# Patient Record
Sex: Female | Born: 1990 | Race: Black or African American | Hispanic: No | Marital: Single | Smoking: Never smoker
Health system: Southern US, Community
[De-identification: ages and names within clinical notes are randomized; demographics above are authoritative.]

## PROBLEM LIST (undated history)

## (undated) DIAGNOSIS — M797 Fibromyalgia: Secondary | ICD-10-CM

## (undated) DIAGNOSIS — G4700 Insomnia, unspecified: Secondary | ICD-10-CM

## (undated) DIAGNOSIS — R51 Headache: Secondary | ICD-10-CM

## (undated) DIAGNOSIS — G629 Polyneuropathy, unspecified: Secondary | ICD-10-CM

## (undated) DIAGNOSIS — R519 Headache, unspecified: Secondary | ICD-10-CM

## (undated) DIAGNOSIS — E109 Type 1 diabetes mellitus without complications: Secondary | ICD-10-CM

## (undated) DIAGNOSIS — R87612 Low grade squamous intraepithelial lesion on cytologic smear of cervix (LGSIL): Secondary | ICD-10-CM

## (undated) DIAGNOSIS — J302 Other seasonal allergic rhinitis: Secondary | ICD-10-CM

## (undated) DIAGNOSIS — H3340 Traction detachment of retina, unspecified eye: Secondary | ICD-10-CM

## (undated) DIAGNOSIS — K219 Gastro-esophageal reflux disease without esophagitis: Secondary | ICD-10-CM

## (undated) HISTORY — DX: Insomnia, unspecified: G47.00

## (undated) HISTORY — DX: Polyneuropathy, unspecified: G62.9

## (undated) HISTORY — DX: Other seasonal allergic rhinitis: J30.2

## (undated) HISTORY — PX: EYE SURGERY: SHX253

## (undated) HISTORY — PX: CATARACT EXTRACTION W/ INTRAOCULAR LENS  IMPLANT, BILATERAL: SHX1307

---
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
## 2010-02-24 ENCOUNTER — Other Ambulatory Visit
Admission: RE | Admit: 2010-02-24 | Discharge: 2010-02-24 | Payer: Self-pay | Source: Home / Self Care | Admitting: Family Medicine

## 2010-12-02 LAB — CBC
HCT: 35.2 — ABNORMAL LOW
Hemoglobin: 12
MCHC: 34.1
MCV: 90.1
Platelets: 302
RBC: 3.91
RDW: 15.4
WBC: 5.7

## 2010-12-02 LAB — WET PREP, GENITAL
Clue Cells Wet Prep HPF POC: NONE SEEN
Trich, Wet Prep: NONE SEEN
Yeast Wet Prep HPF POC: NONE SEEN

## 2010-12-02 LAB — GC/CHLAMYDIA PROBE AMP, GENITAL
Chlamydia, DNA Probe: NEGATIVE
GC Probe Amp, Genital: NEGATIVE

## 2010-12-02 LAB — RH IMMUNE GLOBULIN WORKUP (NOT WOMEN'S HOSP)
ABO/RH(D): A NEG
Antibody Screen: NEGATIVE

## 2010-12-02 LAB — ABO/RH: ABO/RH(D): A NEG

## 2010-12-02 LAB — HCG, QUANTITATIVE, PREGNANCY
hCG, Beta Chain, Quant, S: 397 — ABNORMAL HIGH
hCG, Beta Chain, Quant, S: 846 — ABNORMAL HIGH

## 2010-12-03 LAB — HCG, QUANTITATIVE, PREGNANCY
hCG, Beta Chain, Quant, S: 32 — ABNORMAL HIGH
hCG, Beta Chain, Quant, S: 8 — ABNORMAL HIGH
hCG, Beta Chain, Quant, S: 3

## 2011-11-22 ENCOUNTER — Ambulatory Visit (INDEPENDENT_AMBULATORY_CARE_PROVIDER_SITE_OTHER): Payer: PRIVATE HEALTH INSURANCE | Admitting: Women's Health

## 2011-11-22 ENCOUNTER — Encounter: Payer: Self-pay | Admitting: Women's Health

## 2011-11-22 VITALS — BP 132/66 | Ht 63.0 in | Wt 130.0 lb

## 2011-11-22 DIAGNOSIS — E109 Type 1 diabetes mellitus without complications: Secondary | ICD-10-CM | POA: Insufficient documentation

## 2011-11-22 DIAGNOSIS — B373 Candidiasis of vulva and vagina: Secondary | ICD-10-CM | POA: Insufficient documentation

## 2011-11-22 DIAGNOSIS — IMO0001 Reserved for inherently not codable concepts without codable children: Secondary | ICD-10-CM

## 2011-11-22 DIAGNOSIS — Z309 Encounter for contraceptive management, unspecified: Secondary | ICD-10-CM

## 2011-11-22 LAB — WET PREP FOR TRICH, YEAST, CLUE
Clue Cells Wet Prep HPF POC: NONE SEEN
Trich, Wet Prep: NONE SEEN

## 2011-11-22 MED ORDER — FLUCONAZOLE 150 MG PO TABS: 150.0000 mg | ORAL_TABLET | Freq: Once | ORAL | Status: DC

## 2011-11-22 MED ORDER — TERCONAZOLE 0.8 % VA CREA: 1.0000 | TOPICAL_CREAM | Freq: Every day | VAGINAL | Status: DC

## 2011-11-22 MED ORDER — ORTHO-NOVUM 1/35 (28) 1-35 MG-MCG PO TABS: 1.0000 | ORAL_TABLET | Freq: Every day | ORAL | Status: DC

## 2011-11-22 NOTE — Patient Instructions (Addendum)
Monilial Vaginitis Vaginitis in a soreness, swelling and redness (inflammation) of the vagina and vulva. Monilial vaginitis is not a sexually transmitted infection. CAUSES  Yeast vaginitis is caused by yeast (candida) that is normally found in your vagina. With a yeast infection, the candida has overgrown in number to a point that upsets the chemical balance. SYMPTOMS   White, thick vaginal discharge.   Swelling, itching, redness and irritation of the vagina and possibly the lips of the vagina (vulva).   Burning or painful urination.   Painful intercourse.  DIAGNOSIS  Things that may contribute to monilial vaginitis are:  Postmenopausal and virginal states.   Pregnancy.   Infections.   Being tired, sick or stressed, especially if you had monilial vaginitis in the past.   Diabetes. Good control will help lower the chance.   Birth control pills.   Tight fitting garments.   Using bubble bath, feminine sprays, douches or deodorant tampons.   Taking certain medications that kill germs (antibiotics).   Sporadic recurrence can occur if you become ill.  TREATMENT  Your caregiver will give you medication.  There are several kinds of anti monilial vaginal creams and suppositories specific for monilial vaginitis. For recurrent yeast infections, use a suppository or cream in the vagina 2 times a week, or as directed.   Anti-monilial or steroid cream for the itching or irritation of the vulva may also be used. Get your caregiver's permission.   Painting the vagina with methylene blue solution may help if the monilial cream does not work.   Eating yogurt may help prevent monilial vaginitis.  HOME CARE INSTRUCTIONS   Finish all medication as prescribed.   Do not have sex until treatment is completed or after your caregiver tells you it is okay.   Take warm sitz baths.   Do not douche.   Do not use tampons, especially scented ones.   Wear cotton underwear.   Avoid tight  pants and panty hose.   Tell your sexual partner that you have a yeast infection. They should go to their caregiver if they have symptoms such as mild rash or itching.   Your sexual partner should be treated as well if your infection is difficult to eliminate.   Practice safer sex. Use condoms.   Some vaginal medications cause latex condoms to fail. Vaginal medications that harm condoms are:   Cleocin cream.   Butoconazole (Femstat).   Terconazole (Terazol) vaginal suppository.   Miconazole (Monistat) (may be purchased over the counter).  SEEK MEDICAL CARE IF:   You have a temperature by mouth above 102 F (38.9 C).   The infection is getting worse after 2 days of treatment.   The infection is not getting better after 3 days of treatment.   You develop blisters in or around your vagina.   You develop vaginal bleeding, and it is not your menstrual period.   You have pain when you urinate.   You develop intestinal problems.   You have pain with sexual intercourse.  Document Released: 12/02/2004 Document Revised: 02/11/2011 Document Reviewed: 08/16/2008 [facility] Patient Information 2012 [place], [place].Diabetes and Exercise Regular exercise is important and can help:   Control blood glucose (sugar).   Decrease blood pressure.    Control blood lipids (cholesterol, triglycerides).   Improve overall health.  BENEFITS FROM EXERCISE  Improved fitness.   Improved flexibility.   Improved endurance.   Increased bone density.   Weight control.   Increased muscle strength.   Decreased body fat.  Improvement of the body's use of insulin, a hormone.   Increased insulin sensitivity.   Reduction of insulin needs.   Reduced stress and tension.   Helps you feel better.  People with diabetes who add exercise to their lifestyle gain additional benefits, including:  Weight loss.   Reduced appetite.   Improvement of the body's use of blood glucose.    Decreased risk factors for heart disease:   Lowering of cholesterol and triglycerides.   Raising the level of good cholesterol (high-density lipoproteins, HDL).   Lowering blood sugar.   Decreased blood pressure.  TYPE 1 DIABETES AND EXERCISE  Exercise will usually lower your blood glucose.   If blood glucose is greater than 240 mg/dl, check urine ketones. If ketones are present, do not exercise.   Location of the insulin injection sites may need to be adjusted with exercise. Avoid injecting insulin into areas of the body that will be exercised. For example, avoid injecting insulin into:   The arms when playing tennis.   The legs when jogging. For more information, discuss this with your caregiver.   Keep a record of:   Food intake.   Type and amount of exercise.   Expected peak times of insulin action.   Blood glucose levels.  Do this before, during, and after exercise. Review your records with your caregiver. This will help you to develop guidelines for adjusting food intake and insulin amounts.  TYPE 2 DIABETES AND EXERCISE  Regular physical activity can help control blood glucose.   Exercise is important because it may:   Increase the body's sensitivity to insulin.   Improve blood glucose control.   Exercise reduces the risk of heart disease. It decreases serum cholesterol and triglycerides. It also lowers blood pressure.   Those who take insulin or oral hypoglycemic agents should watch for signs of hypoglycemia. These signs include dizziness, shaking, sweating, chills, and confusion.   Body water is lost during exercise. It must be replaced. This will help to avoid loss of body fluids (dehydration) or heat stroke.  Be sure to talk to your caregiver before starting an exercise program to make sure it is safe for you. Remember, any activity is better than none.  Document Released: 05/15/2003 Document Revised: 02/11/2011 Document Reviewed: 08/29/2008 [facility]  Patient Information 2012 [place], [place].

## 2011-11-22 NOTE — Progress Notes (Addendum)
Theresa [name] 1991-03-08 [id number]    History:    Presents for problem visit as a new patient. Complaint of vaginal discharge with itching. Regular monthly cycle on Ortho-Novum. Not sexually active for greater than 2 years. Normal Pap smear within past year. Completed gardasil series in high school. Diagnosed with type 1 diabetes age 21. Currently being treated by primary care. Last hemoglobin A1c 12.   Past medical history, past surgical history, family history and social history were all reviewed and documented in the EPIC chart. Senior at [employer]. family studies.   ROS:  A  ROS was performed and pertinent positives and negatives are included in the history.  Exam:  Filed Vitals:   11/22/11 1501  BP: 132/66    General appearance:  Normal Head/Neck:  Normal, without cervical or supraclavicular adenopathy. Thyroid:  Symmetrical, normal in size, without palpable masses or nodularity. Respiratory  Effort:  Normal  Auscultation:  Clear without wheezing or rhonchi Cardiovascular  Auscultation:  Regular rate, without rubs, murmurs or gallops  Edema/varicosities:  Not grossly evident Abdominal  Soft,nontender, without masses, guarding or rebound.  Liver/spleen:  No organomegaly noted  Hernia:  None appreciated  Skin  Inspection:  Grossly normal  Palpation:  Grossly normal Neurologic/psychiatric  Orientation:  Normal with appropriate conversation.  Mood/affect:  Normal  Genitourinary    Breasts: Examined lying and sitting.     Right: Without masses, retractions, discharge or axillary adenopathy.     Left: Without masses, retractions, discharge or axillary adenopathy.   Inguinal/mons:  Normal without inguinal adenopathy  External genitalia: Erythematous  BUS/Urethra/Skene's glands:  Normal  Bladder:  Normal  Vagina:  Erythematous with copious curdy discharge   Cervix:  Normal  Uterus:   normal in size, shape and contour.  Midline and mobile  Adnexa/parametria:      Rt: Without masses or tenderness.   Lt: Without masses or tenderness.  Anus and perineum: Normal    Assessment/Plan:  21 y.o. SBF G0 for problem visit of vaginal itching with discharge.  Yeast Poor control type 1 diabetes Contraception management  Plan: Diflucan 150 by mouth x1 dose, Terazol 3 one applicator at bedtime x3 and apply externally as needed. Reviewed correlation between poor diabetes control and yeast. Reviewed importance of better control to prevent long-term problems and yeast, encouraged endocrinologist, currently her insurance does not cover, College student states cannot afford. Encouraged to seek nutritional counseling at [facility]. SBE's, exercise, calcium rich diet, MVI daily encouraged. Ortho-Novum branded name only per request, prescription, proper use, reviewed importance of condoms if become sexually active.    Harrington Challenger [facility], 4:50 PM 11/22/2011 Other contraception management was discussed. Mirena IUD, ParaGard were nexplanon. Risks for blood clots strokes with birth control pills reviewed especially with poor diabetes control reviewed, discussed importance of no pregnancy prior to good glucose control to help with having a healthy pregnancy.

## 2011-11-24 ENCOUNTER — Telehealth: Payer: Self-pay | Admitting: *Deleted

## 2011-11-24 ENCOUNTER — Other Ambulatory Visit: Payer: Self-pay | Admitting: Women's Health

## 2011-11-24 DIAGNOSIS — N920 Excessive and frequent menstruation with regular cycle: Secondary | ICD-10-CM

## 2011-11-24 MED ORDER — NORETHIN ACE-ETH ESTRAD-FE 1-20 MG-MCG PO TABS: 1.0000 | ORAL_TABLET | Freq: Every day | ORAL | Status: DC

## 2011-11-24 NOTE — Telephone Encounter (Signed)
Telephone call, states would like to try a different type of a pill. Used combination birth control pills for menorrhagia with good relief and would like to continue. Will try Loestrin 1/20 will call if problems with the change. Again reviewed IUDs, Mirena IUD may be a good choice since type I diabetic and has a problem with menorrhagia, again reviewed problems with blood clots and strokes with birth control pills especially since type I diabetic with poor control. Declines at this time.

## 2011-11-24 NOTE — Telephone Encounter (Signed)
Left on pt voicemail with the below.

## 2011-11-24 NOTE — Telephone Encounter (Signed)
Pt aunt Burna Mortimer calling stating that pt was going to have birth switched at [facility] 11/22/11. Pt went to pharmacy and same rx was giving ortho-novum. Does pt suppose to have different rx?  Please advise

## 2011-11-24 NOTE — Telephone Encounter (Signed)
Patient at office visit requested branded name Ortho-Novum, had been on generic but preferred branded.

## 2011-11-24 NOTE — Telephone Encounter (Signed)
Pt called back and said she thought a new rx was going to be given. Call back # [phone].

## 2012-01-04 ENCOUNTER — Telehealth: Payer: Self-pay | Admitting: *Deleted

## 2012-01-04 NOTE — Telephone Encounter (Signed)
Pt called c/o some bleeding while taking her Ortho-Novum, pt noticed spotting beginning of October when starting pills. Still in 1st pack and having bleeding not heavy, not cycle flow, pt said bleed has slowed down. Pt informed this can happen when starting on new pill and to give it 3 packs. Pt will follow up as needed.

## 2012-06-14 ENCOUNTER — Ambulatory Visit: Payer: Self-pay | Admitting: Women's Health

## 2012-06-16 ENCOUNTER — Ambulatory Visit (INDEPENDENT_AMBULATORY_CARE_PROVIDER_SITE_OTHER): Payer: PRIVATE HEALTH INSURANCE | Admitting: Women's Health

## 2012-06-16 ENCOUNTER — Encounter: Payer: Self-pay | Admitting: Women's Health

## 2012-06-16 DIAGNOSIS — Z309 Encounter for contraceptive management, unspecified: Secondary | ICD-10-CM

## 2012-06-16 DIAGNOSIS — B373 Candidiasis of vulva and vagina: Secondary | ICD-10-CM

## 2012-06-16 DIAGNOSIS — N898 Other specified noninflammatory disorders of vagina: Secondary | ICD-10-CM

## 2012-06-16 DIAGNOSIS — IMO0001 Reserved for inherently not codable concepts without codable children: Secondary | ICD-10-CM

## 2012-06-16 LAB — WET PREP FOR TRICH, YEAST, CLUE
Clue Cells Wet Prep HPF POC: NONE SEEN
Trich, Wet Prep: NONE SEEN

## 2012-06-16 MED ORDER — TERCONAZOLE 0.8 % VA CREA: 1.0000 | TOPICAL_CREAM | Freq: Every day | VAGINAL | Status: DC

## 2012-06-16 MED ORDER — NORGESTREL-ETHINYL ESTRADIOL 0.3-30 MG-MCG PO TABS: 1.0000 | ORAL_TABLET | Freq: Every day | ORAL | Status: DC

## 2012-06-16 NOTE — Progress Notes (Signed)
Patient ID: Theresa [name], female   DOB: 22-Jun-1990, 22 y.o.   MRN: [id number] Presents with several issues. Type I diabetic, poor control with recurrent yeast. Denies urinary symptoms, abdominal pain or fever.Continues to have irregular cycles, cycle on third week of pack were amenorrhea on Loestrin 1/20. Denies missing pills. New partner. Gardasil series completed.  Exam: Appears well, external genitalia extremely erythematous, speculum exam moderate amount of white curdy discharge with menses present. Wet prep positive for yeast. GC/Chlamydia culture taken, bimanual no CMT or adnexal fullness or tenderness.  Yeast vaginitis Poor control of type 1 diabetes  Plan: Terazol 3 one applicator at bedtime x3, prescription, proper use given and reviewed. Reviewed importance of better control of diabetes in relationship to yeast infections. Reviewed importance of an endocrinologist for management of type 1 diabetes. Reports primary care is in the process of making referral. Also instructed to schedule annual exam here. Prescription, proper use of loorval given will start after finishing current pack. Instructed to call if no relief of irregular cycle or amenorrhea.

## 2012-06-19 LAB — GC/CHLAMYDIA PROBE AMP
CT Probe RNA: NEGATIVE
GC Probe RNA: NEGATIVE

## 2012-11-24 ENCOUNTER — Encounter: Payer: PRIVATE HEALTH INSURANCE | Admitting: Women's Health

## 2013-01-05 ENCOUNTER — Other Ambulatory Visit (HOSPITAL_COMMUNITY)
Admission: RE | Admit: 2013-01-05 | Discharge: 2013-01-05 | Disposition: A | Discharge status: Home / Self Care | Payer: No Typology Code available for payment source | Source: Ambulatory Visit | Attending: Gynecology | Admitting: Gynecology

## 2013-01-05 ENCOUNTER — Encounter: Payer: Self-pay | Admitting: Women's Health

## 2013-01-05 ENCOUNTER — Ambulatory Visit (INDEPENDENT_AMBULATORY_CARE_PROVIDER_SITE_OTHER): Payer: PRIVATE HEALTH INSURANCE | Admitting: Women's Health

## 2013-01-05 VITALS — BP 108/68 | Ht 63.25 in | Wt 115.0 lb

## 2013-01-05 DIAGNOSIS — Z01419 Encounter for gynecological examination (general) (routine) without abnormal findings: Secondary | ICD-10-CM | POA: Insufficient documentation

## 2013-01-05 DIAGNOSIS — N9489 Other specified conditions associated with female genital organs and menstrual cycle: Secondary | ICD-10-CM

## 2013-01-05 DIAGNOSIS — N949 Unspecified condition associated with female genital organs and menstrual cycle: Secondary | ICD-10-CM

## 2013-01-05 LAB — WET PREP FOR TRICH, YEAST, CLUE
Clue Cells Wet Prep HPF POC: NONE SEEN
Trich, Wet Prep: NONE SEEN

## 2013-01-05 MED ORDER — FLUCONAZOLE 100 MG PO TABS
ORAL_TABLET | ORAL | Status: DC
Start: 2013-01-05 — End: 2013-07-09

## 2013-01-05 NOTE — Progress Notes (Signed)
Theresa [name] Jul 28, 1990 [id number]    History:    The patient presents for annual exam.  Complaint of intense vaginal itching with irritation. Has had problems with recurrent yeast due to type 1 diabetes/poor control. Diagnosed at age 22.  Has not been sexually active in greater than 6 months/negative STD screen. Gardasil series completed. Has a scheduled appointment with [name] endocrinologist. Has lost 15 pounds in the past year, relates to increase exercise.   Past medical history, past surgical history, family history and social history were all reviewed and documented in the EPIC chart. [employer] 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   ROS:  A  ROS was performed and pertinent positives and negatives are included in the history.  Exam:  Filed Vitals:   01/05/13 0915  BP: 108/68    General appearance:  Normal Head/Neck:  Normal, without cervical or supraclavicular adenopathy. Thyroid:  Symmetrical, normal in size, without palpable masses or nodularity. Respiratory  Effort:  Normal  Auscultation:  Clear without wheezing or rhonchi Cardiovascular  Auscultation:  Regular rate, without rubs, murmurs or gallops  Edema/varicosities:  Not grossly evident Abdominal  Soft,nontender, without masses, guarding or rebound.  Liver/spleen:  No organomegaly noted  Hernia:  None appreciated  Skin  Inspection:  Grossly normal  Palpation:  Grossly normal Neurologic/psychiatric  Orientation:  Normal with appropriate conversation.  Mood/affect:  Normal  Genitourinary    Breasts: Examined lying and sitting.     Right: Without masses, retractions, discharge or axillary adenopathy.     Left: Without masses, retractions, discharge or axillary adenopathy.   Inguinal/mons:  Normal without inguinal adenopathy  External genitalia:  Extremely erythematous  BUS/Urethra/Skene's glands:  Normal  Bladder:  Normal  Vagina: Erythematous with copious white curdy discharge, wet prep  positive for TNTC E. Yeast  Cervix:  Normal  Uterus:  normal in size, shape and contour.  Midline and mobile  Adnexa/parametria:     Rt: Without masses or tenderness.   Lt: Without masses or tenderness.  Anus and perineum: Normal   Assessment/Plan:  22 y.o. SBF G1P0 for annual exam with complaint of vaginal itching with irritation.  Severe yeast Type 1 diabetes poor control-has followup scheduled Contraception management  Plan: Diflucan 100  2  tablets today repeat in 3 days if necessary. Prescription, proper use given and reviewed importance of diabetes control in relationship to yeast prevention. SBE's, regular exercise, calcium rich diet, MVI daily encouraged. Keep scheduled appointment with Theresa [name] endocrinologist. Contraception options reviewed, Mirena IUD information given and reviewed risk for infection, perforation, hemorrhage. Reviewed importance of condoms when become sexually active. Has had nausea with low overall. Sample pack of Lo Loestrin FE given will schedule Mirena IUD with Dr. Audie Box with next cycle. Pap.    Harrington Challenger WHNP, 11:05 AM 01/05/2013

## 2013-01-05 NOTE — Patient Instructions (Signed)

## 2013-02-26 ENCOUNTER — Telehealth: Payer: Self-pay

## 2013-02-26 NOTE — Telephone Encounter (Signed)
At office visit you gave her samples of Lo Loestrin Fe.  Working well and she would like to get Rx to pharmacy.  Had discussed Mirena but just has not had the time to do that.  Wants Rx. Ok?

## 2013-02-26 NOTE — Telephone Encounter (Signed)
She is a type 1 diabetic, loestrin ok if the lo loestrin is to expensive.

## 2013-02-26 NOTE — Telephone Encounter (Signed)
Ok for the lo loestrin, please e scribe for her.

## 2013-02-27 ENCOUNTER — Other Ambulatory Visit: Payer: Self-pay | Admitting: Women's Health

## 2013-02-27 MED ORDER — NORETHIN-ETH ESTRAD-FE BIPHAS 1 MG-10 MCG / 10 MCG PO TABS: 1.0000 | ORAL_TABLET | Freq: Every day | ORAL | Status: DC

## 2013-02-27 NOTE — Telephone Encounter (Signed)
Mom informed. Rx in.

## 2013-05-07 ENCOUNTER — Ambulatory Visit (INDEPENDENT_AMBULATORY_CARE_PROVIDER_SITE_OTHER): Payer: PRIVATE HEALTH INSURANCE | Admitting: Internal Medicine

## 2013-05-07 VITALS — BP 110/70 | HR 100 | Temp 98.3°F | Resp 16 | Ht 63.0 in | Wt 108.0 lb

## 2013-05-07 DIAGNOSIS — R599 Enlarged lymph nodes, unspecified: Secondary | ICD-10-CM

## 2013-05-07 DIAGNOSIS — R591 Generalized enlarged lymph nodes: Secondary | ICD-10-CM

## 2013-05-07 LAB — POCT CBC
Granulocyte percent: 50.9 %G (ref 37–80)
HCT, POC: 44.9 % (ref 37.7–47.9)
Hemoglobin: 14.1 g/dL (ref 12.2–16.2)
Lymph, poc: 2.3 (ref 0.6–3.4)
MCH, POC: 31.5 pg — AB (ref 27–31.2)
MCHC: 31.4 g/dL — AB (ref 31.8–35.4)
MCV: 100.2 fL — AB (ref 80–97)
MID (cbc): 0.3 (ref 0–0.9)
MPV: 8.8 fL (ref 0–99.8)
POC Granulocyte: 2.7 (ref 2–6.9)
POC LYMPH PERCENT: 43.1 %L (ref 10–50)
POC MID %: 6 %M (ref 0–12)
Platelet Count, POC: 281 10*3/uL (ref 142–424)
RBC: 4.48 M/uL (ref 4.04–5.48)
RDW, POC: 13.1 %
WBC: 5.3 10*3/uL (ref 4.6–10.2)

## 2013-05-07 MED ORDER — AMOXICILLIN-POT CLAVULANATE 875-125 MG PO TABS: 1.0000 | ORAL_TABLET | Freq: Two times a day (BID) | ORAL | Status: DC

## 2013-05-07 NOTE — Progress Notes (Addendum)
Subjective:    Patient ID: Theresa [name], female    DOB: 11/01/1990, 23 y.o.   MRN: [id number]  HPI  Chief Complaint  Patient presents with  . Mass    under right ear x 6 days  tender/was larger yesterday;sl ear pain/no teeth pain No fever,ST,Fatigue,cough,night sweats  Patient Active Problem List   Diagnosis Date Noted  . Type 1 diabetes mellitus 11/22/2011  . Yeast vaginitis 11/22/2011   Current Outpatient Prescriptions on File Prior to Visit  Medication Sig Dispense Refill  . Ascorbic Acid (VITAMIN C PO) Take by mouth.      Marland Kitchen. BIOTIN PO Take by mouth.      . insulin aspart (NOVOLOG) 100 UNIT/ML injection Inject into the skin 3 (three) times daily before meals.      . insulin detemir (LEVEMIR) 100 UNIT/ML injection Inject into the skin at bedtime.      . metFORMIN (GLUCOPHAGE) 850 MG tablet Take 850 mg by mouth 3 (three) times daily with meals.      . Norethindrone-Ethinyl Estradiol-Fe Biphas (LO LOESTRIN FE) 1 MG-10 MCG / 10 MCG tablet Take 1 tablet by mouth daily.  1 Package  10  . fluconazole (DIFLUCAN) 100 MG tablet Take 2 today and 1 tablet as needed  30 tablet  1   No current facility-administered medications on file prior to visit.  onset DM age 23 To begin seeing Dr Sharl MaKerr in March     Review of Systems noncontr    Objective:   Physical Exam BP 110/70  Pulse 100  Temp(Src) 98.3 F (36.8 C) (Oral)  Resp 16  Ht 5\' 3"  (1.6 m)  Wt 108 lb (48.988 kg)  BMI 19.14 kg/m2  SpO2 99%  LMP 04/24/2013 Pupils equal round reactive to light and accommodation TMs clear Nares clear Throat clear teeth exhibit several significant caries with gingivitis but no abscesses Parotid glands non-palpable No anterior cervical lymphadenopathy  In the lower postauricular space on the right is a 1.5 cm movable tender mass that is smooth and slightly fluctuant No overlying erythema/ no supraclavicular adenopathy Lungs clear  Results for orders placed in visit on 05/07/13  POCT  CBC      Result Value Ref Range   WBC 5.3  4.6 - 10.2 K/uL   Lymph, poc 2.3  0.6 - 3.4   POC LYMPH PERCENT 43.1  10 - 50 %L   MID (cbc) 0.3  0 - 0.9   POC MID % 6.0  0 - 12 %M   POC Granulocyte 2.7  2 - 6.9   Granulocyte percent 50.9  37 - 80 %G   RBC 4.48  4.04 - 5.48 M/uL   Hemoglobin 14.1  12.2 - 16.2 g/dL   HCT, POC 78.244.9  95.637.7 - 47.9 %   MCV 100.2 (*) 80 - 97 fL   MCH, POC 31.5 (*) 27 - 31.2 pg   MCHC 31.4 (*) 31.8 - 35.4 g/dL   RDW, POC 21.313.1     Platelet Count, POC 281  142 - 424 K/uL   MPV 8.8  0 - 99.8 fL        Assessment & Plan:  Reactive adenopathy versus cyst/mass  Meds ordered this encounter  Medications  . amoxicillin-clavulanate (AUGMENTIN) 875-125 MG per tablet    Sig: Take 1 tablet by mouth 2 (two) times daily.    Dispense:  20 tablet    Refill:  0   Recheck in 10 days/if no response will need  excisional biopsy

## 2013-05-07 NOTE — Patient Instructions (Signed)
Recheck at 945 am on wed 05/16/13 at our [facility] [address]

## 2013-05-10 ENCOUNTER — Telehealth: Payer: Self-pay

## 2013-05-10 NOTE — Telephone Encounter (Signed)
Advised pt to RTC- she is having other symptoms. She is having mouth pain, dental pain additionally.

## 2013-05-10 NOTE — Telephone Encounter (Signed)
Dr. Merla Richesoolittle:   Patient saw you Monday and was prescribed medication and it is not helping.  Please advise.  [phone] is her best number.  She may be in class so it's okay to leave a voicemail.

## 2013-05-10 NOTE — Progress Notes (Signed)
I have made this appointment per Dr Doolittle's request.

## 2013-05-16 ENCOUNTER — Ambulatory Visit (INDEPENDENT_AMBULATORY_CARE_PROVIDER_SITE_OTHER): Payer: PRIVATE HEALTH INSURANCE | Admitting: Internal Medicine

## 2013-05-16 ENCOUNTER — Encounter: Payer: Self-pay | Admitting: Internal Medicine

## 2013-05-16 VITALS — BP 119/85 | HR 102 | Temp 97.4°F | Resp 16 | Ht 63.0 in | Wt 116.4 lb

## 2013-05-16 DIAGNOSIS — R220 Localized swelling, mass and lump, head: Secondary | ICD-10-CM

## 2013-05-16 DIAGNOSIS — R221 Localized swelling, mass and lump, neck: Secondary | ICD-10-CM

## 2013-05-16 NOTE — Progress Notes (Signed)
Dr Ezzard StandingNewman will see patient on Monday March 16th at 1pm, called patient to advise. Lupita LeashDonna will fax records to his office fax [phone]

## 2013-05-16 NOTE — Progress Notes (Signed)
  This chart was scribed for Tonye Pearsonobert P Jasdeep Dejarnett, MD by Joaquin MusicKristina Sanchez-Matthews, ED Scribe. This patient was seen in room Room/bed 24 and the patient's care was started at 10:06 AM. Subjective:    Patient ID: Roberts Gaudyourtney [name] Liguori, female    DOB: 06/13/1990, 23 y.o.   MRN: [id number] Chief Complaint  Patient presents with  . Follow-up    follow up on cyst on the back of right ear   HPI Roberts Theresa [name] Perra is a 23 y.o. female who [facility].Pt states she is still having severe throbbing pain to [name] ear. She describes the pain as "sharp and throbbing that shoots at times". Pt states the pain begins at the same time every day despite her activity. She states she did not complete her medication (Augmentin) as prescribed due to having diarrhea and emesis as a result of talking the medication. Pt states she went to the dentist yesterday afternoon and states her dentist informed her "everything was WNL". Pt denies having any pain with teeth to [name] side or pain when bitting down. She states she stopped taking her antibiotics "about 6 days ago" and states her Pt states she has taken OTC Ibuprofen and Advil without relief. Pt denies fever, fatigue, night sweats, and weight change.  Patient Active Problem List   Diagnosis Date Noted  . Type 1 diabetes mellitus 11/22/2011   Current outpatient prescriptions: insulin aspart (NOVOLOG) 100 UNIT/ML injection, Inject into the skin 3 (three) times daily before meals., insulin detemir (LEVEMIR) 100 UNIT/ML injection, Inject into the skin at bedtime., Disp: , Rfl: ;   metFORMIN (GLUCOPHAGE) 850 MG tablet, Take 850 mg by mouth 3 (three) times daily with meals., Disp: , Rfl: ;  Norethindrone-Ethinyl Estradiol-Fe Biphas (LO LOESTRIN FE) 1 MG-10 MCG / 10 MCG tablet,  Review of Systems  Constitutional: Negative for fever, fatigue and unexpected weight change.       No night sweats.   Objective:   Physical Exam  Constitutional: She appears well-developed and  well-nourished.  HENT:  Right Ear: External ear normal.  Left Ear: External ear normal.  Nose: Nose normal.  Mouth/Throat: Oropharynx is clear and moist.  In post auricular space there is a 2.5 cm mass that is firm and slightly moveable with darkening of the overlying skin, slightly tender. No other lymph nodes are noted.    Eyes: Conjunctivae and EOM are normal. Pupils are equal, round, and reactive to light.  Neck: Normal range of motion. No thyromegaly present.  Lymphadenopathy:    She has no cervical adenopathy.    Assessment & Plan:   I have completed the patient encounter in its entirety as documented by the scribe, with editing by me where necessary. Tremeka Helbling P. Merla Richesoolittle, M.D.  Diagnosis #1-persistent posterior cervical mass that needs identification? Excision--- to Dr. Ezzard StandingNewman for evaluation

## 2013-05-18 ENCOUNTER — Telehealth: Payer: Self-pay

## 2013-05-18 MED ORDER — MELOXICAM 15 MG PO TABS: 15.0000 mg | ORAL_TABLET | Freq: Every day | ORAL | Status: DC

## 2013-05-18 NOTE — Telephone Encounter (Signed)
PT STATES THAT DURING HER OV ON 05/16/13 HER AND DR.DOOLITTLE DISCUSSED A PAIN MED FOR THE MASS SHE HAS ON HER NECK, SHE STATES THAT IT WAS NEVER CALLED IN. BEST# [phone]

## 2013-05-18 NOTE — Telephone Encounter (Signed)
No pain medication prescribed during this ov. Please advise if one can be prescribed.

## 2013-05-18 NOTE — Telephone Encounter (Signed)
Pt advised of medication sent to pharmacy.

## 2013-05-18 NOTE — Telephone Encounter (Signed)
Discussed with Dr. Merla Richesoolittle.  Planned to send Meloxicam.  Meds ordered this encounter  Medications  . meloxicam (MOBIC) 15 MG tablet    Sig: Take 1 tablet (15 mg total) by mouth daily.    Dispense:  30 tablet    Refill:  0    Order Specific Question:  Supervising Provider    Answer:  DOOLITTLE, ROBERT P [[id number]]

## 2013-05-24 ENCOUNTER — Ambulatory Visit (INDEPENDENT_AMBULATORY_CARE_PROVIDER_SITE_OTHER): Payer: PRIVATE HEALTH INSURANCE | Admitting: Family Medicine

## 2013-05-24 VITALS — BP 110/80 | HR 102 | Temp 98.1°F | Resp 16 | Ht 63.0 in | Wt 116.0 lb

## 2013-05-24 DIAGNOSIS — R221 Localized swelling, mass and lump, neck: Secondary | ICD-10-CM

## 2013-05-24 DIAGNOSIS — E119 Type 2 diabetes mellitus without complications: Secondary | ICD-10-CM

## 2013-05-24 DIAGNOSIS — R599 Enlarged lymph nodes, unspecified: Secondary | ICD-10-CM

## 2013-05-24 DIAGNOSIS — R591 Generalized enlarged lymph nodes: Secondary | ICD-10-CM

## 2013-05-24 DIAGNOSIS — R220 Localized swelling, mass and lump, head: Secondary | ICD-10-CM

## 2013-05-24 DIAGNOSIS — M542 Cervicalgia: Secondary | ICD-10-CM

## 2013-05-24 LAB — POCT CBC
Granulocyte percent: 36.3 %G — AB (ref 37–80)
HCT, POC: 37.8 % (ref 37.7–47.9)
Hemoglobin: 12.1 g/dL — AB (ref 12.2–16.2)
Lymph, poc: 2.4 (ref 0.6–3.4)
MCH, POC: 31.3 pg — AB (ref 27–31.2)
MCHC: 32 g/dL (ref 31.8–35.4)
MCV: 97.7 fL — AB (ref 80–97)
MID (cbc): 0.4 (ref 0–0.9)
MPV: 9.5 fL (ref 0–99.8)
POC Granulocyte: 1.6 — AB (ref 2–6.9)
POC LYMPH PERCENT: 55.2 %L — AB (ref 10–50)
POC MID %: 8.5 %M (ref 0–12)
Platelet Count, POC: 280 10*3/uL (ref 142–424)
RBC: 3.87 M/uL — AB (ref 4.04–5.48)
RDW, POC: 12.9 %
WBC: 4.3 10*3/uL — AB (ref 4.6–10.2)

## 2013-05-24 LAB — POCT GLYCOSYLATED HEMOGLOBIN (HGB A1C): Hemoglobin A1C: >=14

## 2013-05-24 MED ORDER — HYDROCODONE-ACETAMINOPHEN 5-325 MG PO TABS: 1.0000 | ORAL_TABLET | Freq: Three times a day (TID) | ORAL | Status: DC | PRN

## 2013-05-24 NOTE — Progress Notes (Signed)
Chief Complaint:  Chief Complaint  Patient presents with  . Otalgia  . Lymphadenopathy    right side    HPI: Theresa [name] is a 23 y.o. female who is here for recheck of her right sided reactive LAD vs cyst. She was seen b Dr Merla Riches and then was given Amox for possible reactive LAD and had stomach upset. The LAD did not resolve and so was sent to ENT for further eval. She was seen by Dr Ezzard Standing and was given keflex Keflex 500 mg BID at ENT where she saw Dr Ezzard Standing and was told to f/u prn Today she has had more swelling and pain, she had severe pain when she put a warm compress on it.  She is also a diabetic, dx at age 69 with DM type 2???  she is supposed to be on metformin and also novolog with meals and also Levemir 35 untis at night. She is not on any of her insuilm due to cost. She is being followed by [facility] for this and was referred to see the endocrinologist for [facility] sometime in April. She states her sugars are not well controlled in the 300s  And sh does not remember her ast A1c   Past Medical History  Diagnosis Date  . Diabetes mellitus    No past surgical history on file. History   Social History  . Marital Status: Single    Spouse Name: N/A    Number of Children: N/A  . Years of Education: N/A   Social History Main Topics  . Smoking status: Never Smoker   . Smokeless tobacco: None  . Alcohol Use: No  . Drug Use: No  . Sexual Activity: No   Other Topics Concern  . None   Social History Narrative  . None   Family History  Problem Relation Age of Onset  . Diabetes Mother   . Migraines Mother   . Breast cancer Maternal Grandmother   . Thyroid disease Maternal Grandmother    No Known Allergies Prior to Admission medications   Medication Sig Start Date End Date Taking? Authorizing Provider  Ascorbic Acid (VITAMIN C PO) Take by mouth.   Yes Historical Provider, MD  BIOTIN PO Take by mouth.   Yes Historical Provider, MD  cetirizine (ZYRTEC) 10  MG tablet Take 10 mg by mouth daily.   Yes Historical Provider, MD  insulin aspart (NOVOLOG) 100 UNIT/ML injection Inject into the skin 3 (three) times daily before meals.   Yes Historical Provider, MD  insulin detemir (LEVEMIR) 100 UNIT/ML injection Inject into the skin at bedtime.   Yes Historical Provider, MD  metFORMIN (GLUCOPHAGE) 850 MG tablet Take 850 mg by mouth 3 (three) times daily with meals.   Yes Historical Provider, MD  Norethindrone-Ethinyl Estradiol-Fe Biphas (LO LOESTRIN FE) 1 MG-10 MCG / 10 MCG tablet Take 1 tablet by mouth daily. 02/27/13  Yes Harrington Challenger, NP  amoxicillin-clavulanate (AUGMENTIN) 875-125 MG per tablet Take 1 tablet by mouth 2 (two) times daily. 05/07/13   Tonye Pearson, MD  fluconazole (DIFLUCAN) 100 MG tablet Take 2 today and 1 tablet as needed 01/05/13   Harrington Challenger, NP  meloxicam (MOBIC) 15 MG tablet Take 1 tablet (15 mg total) by mouth daily. 05/18/13   Chelle S Jeffery, PA-C     ROS: The patient denies fevers, chills, night sweats, unintentional weight loss, chest pain, palpitations, wheezing, dyspnea on exertion, nausea, vomiting, abdominal pain, dysuria, hematuria,  melena, numbness, weakness, or tingling.   All other systems have been reviewed and were otherwise negative with the exception of those mentioned in the HPI and as above.    PHYSICAL EXAM: Filed Vitals:   05/24/13 1920  BP: 110/80  Pulse: 102  Temp: 98.1 F (36.7 C)  Resp: 16   Filed Vitals:   05/24/13 1920  Height: 5\' 3"  (1.6 m)  Weight: 116 lb (52.617 kg)   Body mass index is 20.55 kg/(m^2).  General: Alert, no acute distress HEENT:  Normocephalic, atraumatic, oropharynx patent. EOMI, PERRLA Cardiovascular:  Regular rate and rhythm, no rubs murmurs or gallops.  No Carotid bruits, radial pulse intact. No pedal edema.  Respiratory: Clear to auscultation bilaterally.  No wheezes, rales, or rhonchi.  No cyanosis, no use of accessory musculature GI: No organomegaly, abdomen is  soft and non-tender, positive bowel sounds.  No masses. Skin: No rashes. Neurologic: Facial musculature symmetric. Psychiatric: Patient is appropriate throughout our interaction. Lymphatic: + right 1.5 x 2 cm postauricular moveable tender mass/ lymphadenopathy Musculoskeletal: Gait intact.   LABS: Results for orders placed in visit on 05/24/13  POCT CBC      Result Value Ref Range   WBC 4.3 (*) 4.6 - 10.2 K/uL   Lymph, poc 2.4  0.6 - 3.4   POC LYMPH PERCENT 55.2 (*) 10 - 50 %L   MID (cbc) 0.4  0 - 0.9   POC MID % 8.5  0 - 12 %M   POC Granulocyte 1.6 (*) 2 - 6.9   Granulocyte percent 36.3 (*) 37 - 80 %G   RBC 3.87 (*) 4.04 - 5.48 M/uL   Hemoglobin 12.1 (*) 12.2 - 16.2 g/dL   HCT, POC 16.137.8  09.637.7 - 47.9 %   MCV 97.7 (*) 80 - 97 fL   MCH, POC 31.3 (*) 27 - 31.2 pg   MCHC 32.0  31.8 - 35.4 g/dL   RDW, POC 04.512.9     Platelet Count, POC 280  142 - 424 K/uL   MPV 9.5  0 - 99.8 fL  POCT GLYCOSYLATED HEMOGLOBIN (HGB A1C)      Result Value Ref Range   Hemoglobin A1C >=14.0       EKG/XRAY:   Primary [facility]   ASSESSMENT/PLAN: Encounter Diagnoses  Name Primary?  . Neck mass Yes  . LAD (lymphadenopathy)   . Diabetes   . Neck pain on right side    Continue with Keflex Continue with mobic Will rx her norco prn for pain I think her DM is so out of control that she cannot get this reactive LAD vs ? Inflammatory cyst  to return to normal.  Will get her sample of Novolog and advise to c/w metformin and then also will try to see if [facility] will help. I have also called the Novadiskus rep to see if he can give me samples of Levemir for her F/u prn otherwise with her PCP and also with endocrinologist, still waiting for appt.   Gross sideeffects, risk and benefits, and alternatives of medications d/w patient. Patient is aware that all medications have potential sideeffects and we are unable to predict every  sideeffect or drug-drug interaction that may occur.  Hamilton CapriLE, Shauna Bodkins PHUONG, DO 05/24/2013 9:18 PM

## 2013-05-25 MED ORDER — INSULIN DETEMIR 100 UNIT/ML FLEXPEN: 35.0000 [IU] | PEN_INJECTOR | Freq: Every day | SUBCUTANEOUS | Status: DC

## 2013-07-02 ENCOUNTER — Other Ambulatory Visit: Payer: Self-pay | Admitting: Otolaryngology

## 2013-07-02 DIAGNOSIS — IMO0002 Reserved for concepts with insufficient information to code with codable children: Secondary | ICD-10-CM

## 2013-07-02 DIAGNOSIS — R229 Localized swelling, mass and lump, unspecified: Principal | ICD-10-CM

## 2013-07-04 ENCOUNTER — Ambulatory Visit
Admission: RE | Admit: 2013-07-04 | Discharge: 2013-07-04 | Disposition: A | Discharge status: Home / Self Care | Payer: PRIVATE HEALTH INSURANCE | Source: Ambulatory Visit | Attending: Otolaryngology | Admitting: Otolaryngology

## 2013-07-04 DIAGNOSIS — R229 Localized swelling, mass and lump, unspecified: Principal | ICD-10-CM

## 2013-07-04 DIAGNOSIS — IMO0002 Reserved for concepts with insufficient information to code with codable children: Secondary | ICD-10-CM

## 2013-07-04 MED ORDER — IOHEXOL 300 MG/ML  SOLN
75.0000 mL | Freq: Once | INTRAMUSCULAR | Status: AC | PRN
  Administered 2013-07-04: 75 mL via INTRAVENOUS

## 2013-07-06 HISTORY — PX: CYST EXCISION: SHX5701

## 2013-07-09 ENCOUNTER — Other Ambulatory Visit: Payer: Self-pay | Admitting: Otolaryngology

## 2013-07-09 ENCOUNTER — Encounter (HOSPITAL_BASED_OUTPATIENT_CLINIC_OR_DEPARTMENT_OTHER): Payer: Self-pay | Admitting: *Deleted

## 2013-07-09 NOTE — H&P (Signed)
PREOPERATIVE H&P  Chief Complaint: right neck infection  HPI: Theresa [name] is a 23 y.o. female who presents for evaluation of a right neck infection that she's had for 2 months that hasn't responded to several rounds of antibiotics. It's adherent to the dermis and superficial to the SCM just below the right ear. Measures 2 x 3 cm . She's taken to the OR for excision under local anesthesia.  Past Medical History  Diagnosis Date  . Diabetes mellitus    No past surgical history on file. History   Social History  . Marital Status: Single    Spouse Name: N/A    Number of Children: N/A  . Years of Education: N/A   Social History Main Topics  . Smoking status: Never Smoker   . Smokeless tobacco: Not on file  . Alcohol Use: No  . Drug Use: No  . Sexual Activity: No   Other Topics Concern  . Not on file   Social History Narrative  . No narrative on file   Family History  Problem Relation Age of Onset  . Diabetes Mother   . Migraines Mother   . Breast cancer Maternal Grandmother   . Thyroid disease Maternal Grandmother    No Known Allergies Prior to Admission medications   Medication Sig Start Date End Date Taking? Authorizing Provider  Ascorbic Acid (VITAMIN C PO) Take by mouth.    Historical Provider, MD  BIOTIN PO Take by mouth.    Historical Provider, MD  cetirizine (ZYRTEC) 10 MG tablet Take 10 mg by mouth daily.    Historical Provider, MD  fluconazole (DIFLUCAN) 100 MG tablet Take 2 today and 1 tablet as needed 01/05/13   Harrington ChallengerNancy J Young, NP  HYDROcodone-acetaminophen (NORCO) 5-325 MG per tablet Take 1 tablet by mouth every 8 (eight) hours as needed for moderate pain. 05/24/13   Thao P Le, DO  insulin aspart (NOVOLOG) 100 UNIT/ML injection Inject into the skin 3 (three) times daily before meals.    Historical Provider, MD  Insulin Detemir (LEVEMIR) 100 UNIT/ML Pen Inject 35 Units into the skin at bedtime. 05/25/13   Thao P Le, DO  meloxicam (MOBIC) 15 MG tablet Take 1  tablet (15 mg total) by mouth daily. 05/18/13   Chelle S Jeffery, PA-C  metFORMIN (GLUCOPHAGE) 850 MG tablet Take 850 mg by mouth 3 (three) times daily with meals.    Historical Provider, MD  Norethindrone-Ethinyl Estradiol-Fe Biphas (LO LOESTRIN FE) 1 MG-10 MCG / 10 MCG tablet Take 1 tablet by mouth daily. 02/27/13   Harrington ChallengerNancy J Young, NP     Positive ROS: right neck pain and HA  All other systems have been reviewed and were otherwise negative with the exception of those mentioned in the HPI and as above.  Physical Exam: There were no vitals filed for this visit.  General: Alert, no acute distress Oral: Normal oral mucosa and tonsils Nasal: Clear nasal passages Neck: 2 x 3 cm right upper neck cyst with discoloration of the skin. Painful to palpation Ear: Ear canal is clear with normal appearing TMs Cardiovascular: Regular rate and rhythm, no murmur.  Respiratory: Clear to auscultation Neurologic: Alert and oriented x 3   Assessment/Plan: RIGHT NECK CYST Plan for Procedure(s): EXCISION RIGHT NECK CYST   Drema Halonhristopher E Tanicka Bisaillon, MD 07/09/2013 5:20 PM

## 2013-07-10 ENCOUNTER — Encounter (HOSPITAL_BASED_OUTPATIENT_CLINIC_OR_DEPARTMENT_OTHER)
Admission: RE | Disposition: A | Discharge status: Home / Self Care | Payer: Self-pay | Source: Ambulatory Visit | Attending: Otolaryngology

## 2013-07-10 ENCOUNTER — Ambulatory Visit (HOSPITAL_BASED_OUTPATIENT_CLINIC_OR_DEPARTMENT_OTHER)
Admission: RE | Admit: 2013-07-10 | Discharge: 2013-07-10 | Disposition: A | Discharge status: Home / Self Care | Payer: No Typology Code available for payment source | Source: Ambulatory Visit | Attending: Otolaryngology | Admitting: Otolaryngology

## 2013-07-10 DIAGNOSIS — E119 Type 2 diabetes mellitus without complications: Secondary | ICD-10-CM | POA: Insufficient documentation

## 2013-07-10 DIAGNOSIS — Z79899 Other long term (current) drug therapy: Secondary | ICD-10-CM | POA: Insufficient documentation

## 2013-07-10 DIAGNOSIS — L723 Sebaceous cyst: Secondary | ICD-10-CM | POA: Insufficient documentation

## 2013-07-10 DIAGNOSIS — Z794 Long term (current) use of insulin: Secondary | ICD-10-CM | POA: Insufficient documentation

## 2013-07-10 HISTORY — PX: MASS EXCISION: SHX2000

## 2013-07-10 HISTORY — DX: Type 1 diabetes mellitus without complications: E10.9

## 2013-07-10 SURGERY — MINOR EXCISION OF MASS
Anesthesia: LOCAL | Site: Neck | Laterality: Right

## 2013-07-10 MED ORDER — CEPHALEXIN 500 MG PO CAPS: 500.0000 mg | ORAL_CAPSULE | Freq: Three times a day (TID) | ORAL | Status: AC

## 2013-07-10 MED ORDER — OXYCODONE-ACETAMINOPHEN 2.5-325 MG PO TABS: 1.0000 | ORAL_TABLET | Freq: Four times a day (QID) | ORAL | Status: DC | PRN

## 2013-07-10 MED ORDER — LIDOCAINE-EPINEPHRINE 1 %-1:100000 IJ SOLN
INTRAMUSCULAR | Status: DC | PRN
  Administered 2013-07-10: 8 mL

## 2013-07-10 SURGICAL SUPPLY — 61 items
BANDAGE GAUZE 4  KLING STR (GAUZE/BANDAGES/DRESSINGS) IMPLANT
BENZOIN TINCTURE PRP APPL 2/3 (GAUZE/BANDAGES/DRESSINGS) IMPLANT
BLADE 15 SAFETY STRL DISP (BLADE) ×3 IMPLANT
BLADE SURG ROTATE 9660 (MISCELLANEOUS) IMPLANT
BNDG CONFORM 3 STRL LF (GAUZE/BANDAGES/DRESSINGS) IMPLANT
CANISTER SUCT 1200ML W/VALVE (MISCELLANEOUS) IMPLANT
CLEANER CAUTERY TIP 5X5 PAD (MISCELLANEOUS) IMPLANT
CORDS BIPOLAR (ELECTRODE) IMPLANT
COVER MAYO STAND STRL (DRAPES) ×3 IMPLANT
COVER TABLE BACK 60X90 (DRAPES) ×3 IMPLANT
DECANTER SPIKE VIAL GLASS SM (MISCELLANEOUS) IMPLANT
DERMABOND ADVANCED (GAUZE/BANDAGES/DRESSINGS)
DERMABOND ADVANCED .7 DNX12 (GAUZE/BANDAGES/DRESSINGS) IMPLANT
DRAPE U-SHAPE 76X120 STRL (DRAPES) ×3 IMPLANT
ELECT COATED BLADE 2.86 ST (ELECTRODE) ×3 IMPLANT
ELECT REM PT RETURN 9FT ADLT (ELECTROSURGICAL) ×3
ELECTRODE REM PT RTRN 9FT ADLT (ELECTROSURGICAL) ×2 IMPLANT
GAUZE SPONGE 4X4 16PLY XRAY LF (GAUZE/BANDAGES/DRESSINGS) IMPLANT
GLOVE BIOGEL PI IND STRL 7.0 (GLOVE) ×2 IMPLANT
GLOVE BIOGEL PI INDICATOR 7.0 (GLOVE) ×1
GLOVE ECLIPSE 6.5 STRL STRAW (GLOVE) ×3 IMPLANT
GLOVE EXAM NITRILE MD LF STRL (GLOVE) ×3 IMPLANT
GLOVE SS BIOGEL STRL SZ 7.5 (GLOVE) ×2 IMPLANT
GLOVE SUPERSENSE BIOGEL SZ 7.5 (GLOVE) ×1
GOWN STRL REUS W/ TWL LRG LVL3 (GOWN DISPOSABLE) ×4 IMPLANT
GOWN STRL REUS W/TWL LRG LVL3 (GOWN DISPOSABLE) ×2
HEMOSTAT SURGICEL .5X2 ABSORB (HEMOSTASIS) IMPLANT
NEEDLE HYPO 25X1 1.5 SAFETY (NEEDLE) IMPLANT
NS IRRIG 1000ML POUR BTL (IV SOLUTION) ×3 IMPLANT
PACK BASIN DAY SURGERY FS (CUSTOM PROCEDURE TRAY) ×3 IMPLANT
PAD CLEANER CAUTERY TIP 5X5 (MISCELLANEOUS)
PENCIL BUTTON HOLSTER BLD 10FT (ELECTRODE) ×3 IMPLANT
SPONGE GAUZE 4X4 12PLY STER LF (GAUZE/BANDAGES/DRESSINGS) ×9 IMPLANT
SPONGE INTESTINAL PEANUT (DISPOSABLE) IMPLANT
STAPLER VISISTAT 35W (STAPLE) IMPLANT
STRIP CLOSURE SKIN 1/2X4 (GAUZE/BANDAGES/DRESSINGS) IMPLANT
STRIP CLOSURE SKIN 1/4X4 (GAUZE/BANDAGES/DRESSINGS) IMPLANT
SUCTION FRAZIER TIP 10 FR DISP (SUCTIONS) ×3 IMPLANT
SUT CHROMIC 3 0 PS 2 (SUTURE) ×3 IMPLANT
SUT CHROMIC 3 0 SH 27 (SUTURE) IMPLANT
SUT CHROMIC 3 0 TIES (SUTURE) IMPLANT
SUT CHROMIC 4 0 RB 1X27 (SUTURE) ×3 IMPLANT
SUT ETHILON 4 0 PS 2 18 (SUTURE) IMPLANT
SUT ETHILON 5 0 P 3 18 (SUTURE) ×1
SUT ETHILON 6 0 PS 3 18 (SUTURE) ×3 IMPLANT
SUT NYLON ETHILON 5-0 P-3 1X18 (SUTURE) ×2 IMPLANT
SUT SILK 2 0 TIES 17X18 (SUTURE)
SUT SILK 2-0 18XBRD TIE BLK (SUTURE) IMPLANT
SUT SILK 3 0 SH 30 (SUTURE) IMPLANT
SUT SILK 3 0 TIES 17X18 (SUTURE)
SUT SILK 3-0 18XBRD TIE BLK (SUTURE) IMPLANT
SUT VIC AB 5-0 P-3 18X BRD (SUTURE) IMPLANT
SUT VIC AB 5-0 P3 18 (SUTURE)
SWAB COLLECTION DEVICE MRSA (MISCELLANEOUS) ×3 IMPLANT
SYR BULB 3OZ (MISCELLANEOUS) ×3 IMPLANT
SYR CONTROL 10ML LL (SYRINGE) ×3 IMPLANT
TOWEL OR 17X24 6PK STRL BLUE (TOWEL DISPOSABLE) ×6 IMPLANT
TRAY DSU PREP LF (CUSTOM PROCEDURE TRAY) ×3 IMPLANT
TUBE ANAEROBIC SPECIMEN COL (MISCELLANEOUS) ×3 IMPLANT
TUBE CONNECTING 20X1/4 (TUBING) ×3 IMPLANT
UNDERPAD 30X30 INCONTINENT (UNDERPADS AND DIAPERS) ×3 IMPLANT

## 2013-07-10 NOTE — Brief Op Note (Signed)
07/10/2013  10:22 AM  PATIENT:  Theresa [name]  23 y.o. female  PRE-OPERATIVE DIAGNOSIS:  RIGHT NECK CYST  POST-OPERATIVE DIAGNOSIS:  RIGHT NECK CYST  PROCEDURE:  Procedure(s): EXCISION RIGHT NECK CYST (Right) with culture  SURGEON:  Surgeon(s) and Role:    * Drema Halonhristopher E Newman, MD - Primary  PHYSICIAN ASSISTANT:   ASSISTANTS: none   ANESTHESIA:   local  EBL:     BLOOD ADMINISTERED:none  DRAINS: none   LOCAL MEDICATIONS USED:  XYLOCAINE with EPI  7 cc  SPECIMEN:  Source of Specimen:  right neck cyst  DISPOSITION OF SPECIMEN:  PATHOLOGY  COUNTS:  YES  TOURNIQUET:  * No tourniquets in log *  DICTATION: .Other Dictation: Dictation Number [id number]  PLAN OF CARE: Discharge to home after PACU  PATIENT DISPOSITION:  PACU - hemodynamically stable.   Delay start of Pharmacological VTE agent (>24hrs) due to surgical blood loss or risk of bleeding: not applicable

## 2013-07-10 NOTE — Interval H&P Note (Signed)
History and Physical Interval Note:  07/10/2013 9:13 AM  Theresa [name]  has presented today for surgery, with the diagnosis of RIGHT NECK CYST  The various methods of treatment have been discussed with the patient and family. After consideration of risks, benefits and other options for treatment, the patient has consented to  Procedure(s): EXCISION RIGHT NECK CYST (Right) as a surgical intervention .  The patient's history has been reviewed, patient examined, no change in status, stable for surgery.  I have reviewed the patient's chart and labs.  Questions were answered to the patient's satisfaction.     Drema Halonhristopher E Newman

## 2013-07-10 NOTE — Discharge Instructions (Signed)
Keep incision dressed and dry for 24 hrs . May then remove dressing and get incision wet. Can reapply dressing or leave open and apply antibiotic ointment to incision site daily (neosporin or bacitracin ointment or similar) Call office for follow up appt in 6-7 days   [phone] Take Keflex 500 mg three times per day for the next 5 days Tylenol or percocet prn pain

## 2013-07-11 ENCOUNTER — Encounter (HOSPITAL_BASED_OUTPATIENT_CLINIC_OR_DEPARTMENT_OTHER): Payer: Self-pay | Admitting: Otolaryngology

## 2013-07-11 NOTE — Op Note (Deleted)
NAME:  [name], Theresa              ACCOUNT NO.:  [id number]  MEDICAL RECORD NO.:  [id number]  LOCATION:                               FACILITY:  [facility]  PHYSICIAN:  Rether Rison E. Thekla Colborn, M.D.DATE OF BIRTH:  06/07/1990  DATE OF PROCEDURE:  07/10/2013 DATE OF DISCHARGE:  07/10/2013                              OPERATIVE REPORT   PREOPERATIVE DIAGNOSIS:  Chronically infected right neck cyst.  POSTOPERATIVE DIAGNOSIS:  Chronically infected right neck cyst.  OPERATION:  Excision of chronically infected right neck cyst with culture.  SURGEON:  Ceceilia Cephus E. Tiasha Helvie, M.D.  ANESTHESIA:  Local, 1% Xylocaine with epinephrine, 7 mL.  COMPLICATIONS:  None.  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  She has been on several rounds of antibiotics including Biaxin, amoxicillin and Keflex.  On exam, she has about 2 x 3 cm cyst that is attached to the dermis, is painful.  There is no fistula site to the surface, has some discoloration of the skin, it is overlying the upper sternocleidomastoid muscle just below the ear on the right side of the neck.  She was taken to the operating room at the time for excision of right neck cyst.  DESCRIPTION OF PROCEDURE:  The right neck was prepped with Betadine solution and draped down with sterile towels.  The edges of the raised cyst were marked out and measured approximately 2 x 3.5 cm.  The cyst was elliptically excised, including the overlying dermis.  Anteriorly and superiorly the cyst was partially entered and a little bit of a purulent discharge was expressed, and this was cultured.  Dissection was carried down to the edge of the pharyngeal sternocleidomastoid muscle of the anterior or superficial jugular vein was identified and preserved as was the greater auricular nerve which was adjacent and then partially involved by this infected cyst.   There was an adjacent lymph node that was removed and sent with the cyst to Pathology.  Hemostasis was obtained with electrocautery and a 4-0 chromic suture ligated with small bleeding vessel.  Wound was then closed with 3-0 chromic sutures subcutaneously and 6-0 nylon to reapproximate the skin edges. Bacitracin ointment and dressing were applied.  [name] tolerated this well.  She was subsequently discharged home later this morning, placed on Keflex 500 mg t.i.d. for 1 week and Tylenol and hydrocodone p.r.n. pain.  She will follow up in my office in 6 days for recheck and have sutures removed.          ______________________________ Deliyah Muckle E. Demitris Pokorny, M.D.     CEN/MEDQ  D:  07/10/2013  T:  07/11/2013  Job:  [id number] 

## 2013-07-11 NOTE — Op Note (Deleted)
NAMGilford [name]:  Vaquera, Eurydice              ACCOUNT NO.:  [id number]  MEDICAL RECORD NO.:  [id number]  LOCATION:                               FACILITY:  [facility]  PHYSICIAN:  Kristine GarbeChristopher E. Ezzard StandingNewman, M.D.DATE OF BIRTH:  11/28/1990  DATE OF PROCEDURE:  07/10/2013 DATE OF DISCHARGE:  07/10/2013                              OPERATIVE REPORT   PREOPERATIVE DIAGNOSIS:  Chronically infected right neck cyst.  POSTOPERATIVE DIAGNOSIS:  Chronically infected right neck cyst.  OPERATION:  Excision of chronically infected right neck cyst with culture.  SURGEON:  Kristine GarbeChristopher E. Ezzard StandingNewman, M.D.  ANESTHESIA:  Local, 1% Xylocaine with epinephrine, 7 mL.  COMPLICATIONS:  None.  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  She has been on several rounds of antibiotics including Biaxin, amoxicillin and Keflex.  On exam, she has about 2 x 3 cm cyst that is attached to the dermis, is painful.  There is no fistula site to the surface, has some discoloration of the skin, it is overlying the upper sternocleidomastoid muscle just below the ear on the right side of the neck.  She was taken to the operating room at the time for excision of right neck cyst.  DESCRIPTION OF PROCEDURE:  The right neck was prepped with Betadine solution and draped down with sterile towels.  The edges of the raised cyst were marked out and measured approximately 2 x 3.5 cm.  The cyst was elliptically excised, including the overlying dermis.  Anteriorly and superiorly the cyst was partially entered and a little bit of a purulent discharge was expressed, and this was cultured.  Dissection was carried down to the edge of the pharyngeal sternocleidomastoid muscle of the anterior or superficial jugular vein was identified and preserved as was the greater auricular nerve which was adjacent and then partially involved by this infected cyst.   There was an adjacent lymph node that was removed and sent with the cyst to Pathology.  Hemostasis was obtained with electrocautery and a 4-0 chromic suture ligated with small bleeding vessel.  Wound was then closed with 3-0 chromic sutures subcutaneously and 6-0 nylon to reapproximate the skin edges. Bacitracin ointment and dressing were applied.  Taralynn tolerated this well.  She was subsequently discharged home later this morning, placed on Keflex 500 mg t.i.d. for 1 week and Tylenol and hydrocodone p.r.n. pain.  She will follow up in my office in 6 days for recheck and have sutures removed.          ______________________________ Kristine Garbehristopher E. Ezzard StandingNewman, M.D.     CEN/MEDQ  D:  07/10/2013  T:  07/11/2013  Job:  [id number]

## 2013-07-11 NOTE — Op Note (Signed)
NAME:  [name], Theresa              ACCOUNT NO.:  [id number]  MEDICAL RECORD NO.:  [id number]  LOCATION:                               FACILITY:  [facility]  PHYSICIAN:  Christopher E. Newman, M.D.DATE OF BIRTH:  10/31/1990  DATE OF PROCEDURE:  07/10/2013 DATE OF DISCHARGE:  07/10/2013                              OPERATIVE REPORT   PREOPERATIVE DIAGNOSIS:  Chronically infected right neck cyst.  POSTOPERATIVE DIAGNOSIS:  Chronically infected right neck cyst.  OPERATION:  Excision of chronically infected right neck cyst with culture.  SURGEON:  Christopher E. Newman, M.D.  ANESTHESIA:  Local, 1% Xylocaine with epinephrine, 7 mL.  COMPLICATIONS:  None.  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  She has been on several rounds of antibiotics including Biaxin, amoxicillin and Keflex.  On exam, she has about 2 x 3 cm cyst that is attached to the dermis, is painful.  There is no fistula site to the surface, has some discoloration of the skin, it is overlying the upper sternocleidomastoid muscle just below the ear on the right side of the neck.  She was taken to the operating room at the time for excision of right neck cyst.  DESCRIPTION OF PROCEDURE:  The right neck was prepped with Betadine solution and draped down with sterile towels.  The edges of the raised cyst were marked out and measured approximately 2 x 3.5 cm.  The cyst was elliptically excised, including the overlying dermis.  Anteriorly and superiorly the cyst was partially entered and a little bit of a purulent discharge was expressed, and this was cultured.  Dissection was carried down to the edge of the pharyngeal sternocleidomastoid muscle of the anterior or superficial jugular vein was identified and preserved as was the greater auricular nerve which was adjacent and then partially involved by this infected cyst.   There was an adjacent lymph node that was removed and sent with the cyst to Pathology.  Hemostasis was obtained with electrocautery and a 4-0 chromic suture ligated with small bleeding vessel.  Wound was then closed with 3-0 chromic sutures subcutaneously and 6-0 nylon to reapproximate the skin edges. Bacitracin ointment and dressing were applied.  Theresa [name] tolerated this well.  She was subsequently discharged home later this morning, placed on Keflex 500 mg t.i.d. for 1 week and Tylenol and hydrocodone p.r.n. pain.  She will follow up in my office in 6 days for recheck and have sutures removed.          ______________________________ Christopher E. Newman, M.D.     CEN/MEDQ  D:  07/10/2013  T:  07/11/2013  Job:  [id number] 

## 2013-07-12 ENCOUNTER — Encounter (HOSPITAL_BASED_OUTPATIENT_CLINIC_OR_DEPARTMENT_OTHER): Payer: Self-pay | Admitting: Otolaryngology

## 2013-07-13 LAB — CULTURE, ROUTINE-ABSCESS: Culture: NO GROWTH

## 2013-07-14 ENCOUNTER — Ambulatory Visit (INDEPENDENT_AMBULATORY_CARE_PROVIDER_SITE_OTHER): Payer: PRIVATE HEALTH INSURANCE | Admitting: Family Medicine

## 2013-07-14 ENCOUNTER — Ambulatory Visit: Payer: PRIVATE HEALTH INSURANCE

## 2013-07-14 VITALS — BP 118/82 | HR 107 | Temp 98.6°F | Resp 16 | Ht 62.5 in | Wt 119.2 lb

## 2013-07-14 DIAGNOSIS — R51 Headache: Secondary | ICD-10-CM

## 2013-07-14 DIAGNOSIS — M542 Cervicalgia: Secondary | ICD-10-CM

## 2013-07-14 DIAGNOSIS — R519 Headache, unspecified: Secondary | ICD-10-CM

## 2013-07-14 DIAGNOSIS — M25569 Pain in unspecified knee: Secondary | ICD-10-CM

## 2013-07-14 DIAGNOSIS — T148XXA Other injury of unspecified body region, initial encounter: Secondary | ICD-10-CM

## 2013-07-14 MED ORDER — CYCLOBENZAPRINE HCL 5 MG PO TABS: 5.0000 mg | ORAL_TABLET | Freq: Three times a day (TID) | ORAL | Status: DC | PRN

## 2013-07-14 MED ORDER — OXYCODONE-ACETAMINOPHEN 5-325 MG PO TABS: 1.0000 | ORAL_TABLET | Freq: Four times a day (QID) | ORAL | Status: DC | PRN

## 2013-07-14 NOTE — Progress Notes (Deleted)
   Subjective:    Patient ID: Theresa [name], female    DOB: 05/05/1990, 23 y.o.   MRN: [id number]  HPI  1. Fall x 2 weeks ago- slipped walking out of shower, fell backward, falling onto back, no LOC  patient complains that "everything hurts"-headache, legs, back, aching badly at night, constant aching and throbbing Pain consistent since fall, no improvement Bruises have gone away  2. Diabetes-patient has seen Dr. Ester RinkJ. Kerr at [facility] on 4/27.  In process of regulating medication.  She is to begin insulin. Insulin Rx at [facility] Sugar levels improved-in 200's  Review of Systems     Objective:   Physical Exam        Assessment & Plan:

## 2013-07-14 NOTE — Patient Instructions (Signed)
Head Injury, Adult  You have received a head injury. It does not appear serious at this time. Headaches and vomiting are common following head injury. It should be easy to awaken from sleeping. Sometimes it is necessary for you to stay in the emergency department for a while for observation. Sometimes admission to the hospital may be needed. After injuries such as yours, most problems occur within the first 24 hours, but side effects may occur up to 7 10 days after the injury. It is important for you to carefully monitor your condition and contact your health care provider or seek immediate medical care if there is a change in your condition.  WHAT ARE THE TYPES OF HEAD INJURIES?  Head injuries can be as minor as a bump. Some head injuries can be more severe. More severe head injuries include:  · A jarring injury to the brain (concussion).  · A bruise of the brain (contusion). This mean there is bleeding in the brain that can cause swelling.  · A cracked skull (skull fracture).  · Bleeding in the brain that collects, clots, and forms a bump (hematoma).  WHAT CAUSES A HEAD INJURY?  A serious head injury is most likely to happen to someone who is in a car wreck and is not wearing a seat belt. Other causes of major head injuries include bicycle or motorcycle accidents, sports injuries, and falls.  HOW ARE HEAD INJURIES DIAGNOSED?  A complete history of the event leading to the injury and your current symptoms will be helpful in diagnosing head injuries. Many times, pictures of the brain, such as CT or MRI are needed to see the extent of the injury. Often, an overnight hospital stay is necessary for observation.   WHEN SHOULD I SEEK IMMEDIATE MEDICAL CARE?   You should get help right away if:  · You have confusion or drowsiness.  · You feel sick to your stomach (nauseous) or have continued, forceful vomiting.  · You have dizziness or unsteadiness that is getting worse.  · You have severe, continued headaches not  relieved by medicine. Only take over-the-counter or prescription medicines for pain, fever, or discomfort as directed by your health care provider.  · You do not have normal function of the arms or legs or are unable to walk.  · You notice changes in the black spots in the center of the colored part of your eye (pupil).  · You have a clear or bloody fluid coming from your nose or ears.  · You have a loss of vision.  During the next 24 hours after the injury, you must stay with someone who can watch you for the warning signs. This person should contact local emergency services (911 in the [place] if you have seizures, you become unconscious, or you are unable to wake up.  HOW CAN I PREVENT A HEAD INJURY IN THE FUTURE?  The most important factor for preventing major head injuries is avoiding motor vehicle accidents.  To minimize the potential for damage to your head, it is crucial to wear seat belts while riding in motor vehicles. Wearing helmets while bike riding and playing collision sports (like football) is also helpful. Also, avoiding dangerous activities around the house will further help reduce your risk of head injury.   WHEN CAN I RETURN TO NORMAL ACTIVITIES AND ATHLETICS?  You should be reevaluated by your health care provider before returning to these activities. If you have any of the following symptoms, you should not return   to activities or contact sports until 1 week after the symptoms have stopped:  · Persistent headache.  · Dizziness or vertigo.  · Poor attention and concentration.  · Confusion.  · Memory problems.  · Nausea or vomiting.  · Fatigue or tire easily.  · Irritability.  · Intolerant of bright lights or loud noises.  · Anxiety or depression.  · Disturbed sleep.  MAKE SURE YOU:   · Understand these instructions.  · Will watch your condition.  · Will get help right away if you are not doing well or get worse.  Document Released: 02/22/2005 Document Revised: 12/13/2012 Document Reviewed:  10/30/2012  ExitCare® Patient Information ©2014 [employer].

## 2013-07-14 NOTE — Progress Notes (Signed)
Chief Complaint:  Chief Complaint  Patient presents with  . Fall    fell in the shower x 2 weeks     HPI: Theresa [name] is a 23 y.o. female who is here for  1. Muscles aches all over. She had a Fall x 2 weeks ago- slipped while walking out of shower, fell backward, fell onto back, no LOC  patient complains that "everything hurts"-headache, legs, back, aching badly at night, constant aching and throbbing . She had leftover oxycodone from recent neck abscess/infection from ENT and have been using that sparingly to help with pain.  Pain consistent since fall, no improvement.  Her Bruises have gone away . She denies vision pain, SOB, CP, confusion, gait changes., denis n/v/abd pan, weakness/numbness/tingling. She has mild HA and diffuse msk pain  2. Diabetes-patient has seen Dr. Ester RinkJ. Kerr at [facility] on 4/27. In process of regulating medication. Sugar levels improved-in 200's   Past Medical History  Diagnosis Date  . Insulin dependent type 1 diabetes mellitus    Past Surgical History  Procedure Laterality Date  . Mass excision Right 07/10/2013    Procedure: MINOR EXCISION RIGHT NECK CYST;  Surgeon: Drema Halonhristopher E Newman, MD;  Location: [facility];  Service: ENT;  Laterality: Right;   History   Social History  . Marital Status: Single    Spouse Name: N/A    Number of Children: N/A  . Years of Education: N/A   Social History Main Topics  . Smoking status: Never Smoker   . Smokeless tobacco: None  . Alcohol Use: No  . Drug Use: No  . Sexual Activity: No   Other Topics Concern  . None   Social History Narrative  . None   Family History  Problem Relation Age of Onset  . Diabetes Mother   . Migraines Mother   . Breast cancer Maternal Grandmother   . Thyroid disease Maternal Grandmother    Allergies  Allergen Reactions  . Aloe Hives  . Olive Oil Hives   Prior to Admission medications   Medication Sig Start Date End Date Taking? Authorizing Provider    cephALEXin (KEFLEX) 500 MG capsule Take 1 capsule (500 mg total) by mouth 3 (three) times daily. 07/10/13 07/15/13 Yes Drema Halonhristopher E Newman, MD  fexofenadine (ALLEGRA) 180 MG tablet Take 180 mg by mouth daily.   Yes Historical Provider, MD  insulin aspart (NOVOLOG) 100 UNIT/ML injection Inject into the skin 3 (three) times daily before meals. Sliding scale   Yes Historical Provider, MD  Norethindrone-Ethinyl Estradiol-Fe Biphas (LO LOESTRIN FE) 1 MG-10 MCG / 10 MCG tablet Take 1 tablet by mouth daily. 02/27/13  Yes Harrington ChallengerNancy J Young, NP  clarithromycin (BIAXIN) 500 MG tablet Take 500 mg by mouth 2 (two) times daily. 07/03/13   Historical Provider, MD  oxycodone-acetaminophen (PERCOCET) 2.5-325 MG per tablet Take 1-2 tablets by mouth every 6 (six) hours as needed for pain. 07/10/13   Drema Halonhristopher E Newman, MD  oxyCODONE-acetaminophen (PERCOCET/ROXICET) 5-325 MG per tablet Take by mouth every 4 (four) hours as needed for severe pain.    Historical Provider, MD     ROS: The patient denies fevers, chills, night sweats, unintentional weight loss, chest pain, palpitations, wheezing, dyspnea on exertion, nausea, vomiting, abdominal pain, dysuria, hematuria, melena, numbness, weakness, or tingling.   All other systems have been reviewed and were otherwise negative with the exception of those mentioned in the HPI and as above.    PHYSICAL EXAM:  Filed Vitals:   07/14/13 1505  BP: 118/82  Pulse: 107  Temp: 98.6 F (37 C)  Resp: 16   Filed Vitals:   07/14/13 1505  Height: 5' 2.5" (1.588 m)  Weight: 119 lb 3.2 oz (54.069 kg)   Body mass index is 21.44 kg/(m^2).  General: Alert, no acute distress, thin AA female, exopthalamaous HEENT:  Normocephalic, atraumatic, oropharynx patent. EOMI, PERRLA Cardiovascular:  Regular rate and rhythm, no rubs murmurs or gallops.  No Carotid bruits, radial pulse intact. No pedal edema.  Respiratory: Clear to auscultation bilaterally.  No wheezes, rales, or rhonchi.  No  cyanosis, no use of accessory musculature GI: No organomegaly, abdomen is soft and non-tender, positive bowel sounds.  No masses. Skin: No rashes. + right neck surgical wound healing well, no e/o infection Neurologic: Facial musculature symmetric. CN 2-12 grossly normal  Psychiatric: Patient is appropriate throughout our interaction. Lymphatic: No cervical lymphadenopathy Musculoskeletal: Gait intact. 2/2 DTRS UE and LE Neck + paramsk tenderness  c spine, primarily right trapezius Full ROM but has pain she is stiff She has tenderness at occipit, no lumps/bumps or bruising  L spine normal -nontender UE and LE 5/5 strength, 2/2 DTRs No saddle anesthesia Straight leg negative  Hip exam--normal  Knee bilateral pain in knee She has decrease ROM due to pain Neg swelling, lachman, mcMurray  +/- joint line tenderness Anterior knee tenderness   LABS: Results for orders placed during the hospital encounter of 07/10/13  ANAEROBIC CULTURE      Result Value Ref Range   Specimen Description ABSCESS NECK RIGHT     Special Requests NONE     Gram Stain       Value: ABUNDANT WBC PRESENT,BOTH PMN AND MONONUCLEAR     NO SQUAMOUS EPITHELIAL CELLS SEEN     NO ORGANISMS SEEN     Performed at [facility]   Culture       Value: NO ANAEROBES ISOLATED; CULTURE IN PROGRESS FOR 5 DAYS     Performed at [facility]   Report Status PENDING    CULTURE, ROUTINE-ABSCESS      Result Value Ref Range   Specimen Description ABSCESS NECK RIGHT     Special Requests NONE     Gram Stain       Value: ABUNDANT WBC PRESENT,BOTH PMN AND MONONUCLEAR     NO SQUAMOUS EPITHELIAL CELLS SEEN     NO ORGANISMS SEEN     Performed at [facility]   Culture       Value: NO GROWTH 3 DAYS     Performed at [facility]   Report Status 07/13/2013 FINAL       EKG/XRAY:   Primary read [facility]. Neg for fx or dislocation   ASSESSMENT/PLAN: Encounter Diagnoses    Name Primary?  . Neck pain Yes  . Occipital pain   . Knee pain   . Sprain and strain    Rx Oxycodone,  prn Ibuprofen  Prn  F/u prn Gross sideeffects, risk and benefits, and alternatives of medications d/w patient. Patient is aware that all medications have potential sideeffects and we are unable to predict every sideeffect or drug-drug interaction that may occur.  Lenell Antuhao P Le, DO 07/14/2013 5:05 PM

## 2013-07-15 LAB — ANAEROBIC CULTURE

## 2013-07-19 ENCOUNTER — Other Ambulatory Visit: Payer: Self-pay | Admitting: Emergency Medicine

## 2013-07-19 ENCOUNTER — Ambulatory Visit (INDEPENDENT_AMBULATORY_CARE_PROVIDER_SITE_OTHER): Payer: PRIVATE HEALTH INSURANCE | Admitting: Emergency Medicine

## 2013-07-19 VITALS — BP 118/80 | HR 109 | Temp 98.7°F | Resp 16 | Ht 62.5 in | Wt 115.6 lb

## 2013-07-19 DIAGNOSIS — IMO0001 Reserved for inherently not codable concepts without codable children: Secondary | ICD-10-CM

## 2013-07-19 DIAGNOSIS — Z9119 Patient's noncompliance with other medical treatment and regimen: Secondary | ICD-10-CM

## 2013-07-19 DIAGNOSIS — E119 Type 2 diabetes mellitus without complications: Secondary | ICD-10-CM

## 2013-07-19 DIAGNOSIS — M791 Myalgia, unspecified site: Secondary | ICD-10-CM

## 2013-07-19 DIAGNOSIS — Z91199 Patient's noncompliance with other medical treatment and regimen due to unspecified reason: Secondary | ICD-10-CM

## 2013-07-19 DIAGNOSIS — Z9114 Patient's other noncompliance with medication regimen: Secondary | ICD-10-CM

## 2013-07-19 LAB — POCT URINALYSIS DIPSTICK
Bilirubin, UA: NEGATIVE
Glucose, UA: >500
Ketones, UA: NEGATIVE
Leukocytes, UA: NEGATIVE
Nitrite, UA: NEGATIVE
Protein, UA: NEGATIVE
Spec Grav, UA: 1.01
Urobilinogen, UA: 0.2
pH, UA: 7

## 2013-07-19 LAB — POCT UA - MICROSCOPIC ONLY
Bacteria, U Microscopic: NEGATIVE
Casts, Ur, LPF, POC: NEGATIVE
Crystals, Ur, HPF, POC: NEGATIVE
Mucus, UA: NEGATIVE
WBC, Ur, HPF, POC: NEGATIVE
Yeast, UA: NEGATIVE

## 2013-07-19 LAB — COMPREHENSIVE METABOLIC PANEL
ALT: 9 U/L (ref 0–35)
AST: 11 U/L (ref 0–37)
Albumin: 3.8 g/dL (ref 3.5–5.2)
Alkaline Phosphatase: 48 U/L (ref 39–117)
BUN: 6 mg/dL (ref 6–23)
CO2: 26 mEq/L (ref 19–32)
Calcium: 9.2 mg/dL (ref 8.4–10.5)
Chloride: 97 mEq/L (ref 96–112)
Creat: 0.57 mg/dL (ref 0.50–1.10)
Glucose, Bld: 476 mg/dL — ABNORMAL HIGH (ref 70–99)
Potassium: 4.5 mEq/L (ref 3.5–5.3)
Sodium: 133 mEq/L — ABNORMAL LOW (ref 135–145)
Total Bilirubin: 0.4 mg/dL (ref 0.2–1.2)
Total Protein: 6.6 g/dL (ref 6.0–8.3)

## 2013-07-19 LAB — POCT GLYCOSYLATED HEMOGLOBIN (HGB A1C): Hemoglobin A1C: >14

## 2013-07-19 LAB — GLUCOSE, POCT (MANUAL RESULT ENTRY)

## 2013-07-19 LAB — MAGNESIUM: Magnesium: 1.8 mg/dL (ref 1.5–2.5)

## 2013-07-19 LAB — POCT SEDIMENTATION RATE: POCT SED RATE: 48 mm/hr — AB (ref 0–22)

## 2013-07-19 MED ORDER — HYDROMORPHONE HCL 2 MG PO TABS: 2.0000 mg | ORAL_TABLET | ORAL | Status: DC | PRN

## 2013-07-19 MED ORDER — NAPROXEN SODIUM 550 MG PO TABS: 550.0000 mg | ORAL_TABLET | Freq: Two times a day (BID) | ORAL | Status: DC

## 2013-07-19 NOTE — Progress Notes (Signed)
[facility] [address], [place] [id number] [phone]  Date:  07/19/2013   Name:  Theresa GaudyCourtney R Bahena   DOB:  09/22/1990   MRN:  [id number]  PCP:  Evlyn CourierHILL,GERALD K, MD    Chief Complaint: muscle aches and Headache   History of Present Illness:  Theresa [name] is a 23 y.o. very pleasant female patient who presents with the following:  IDDM.  Underwent a neck cyst excision a week ago.  Since has experienced FBS of 300 consistently.  Fell in shower on wet floor 3 weeks ago and landed on her back.  Since has had generalized muscle aches.  More recently she has experienced muscle spasms worse at night that leave her crying. Pain not relieved with vicodin nor percocet.  She has doubled the percocet dose and developed itching. Has not been very aggressive with her sugar management.  No rash, cough, coryza, nausea or vomiting, stool change.  No fever or chills.  No tick exposure or insect bites.  Finished perioperative antibiotics today.  No bruising. No improvement with over the counter medications or other home remedies. Denies other complaint or health concern today.   Saw Dr Conley RollsLe this weekend and put on percocet and flexeril with no improvement.  \  Discussed with Dr Sharl MaKerr at [facility] (endocrinology) who advised me she has not been using insulin for the best part of the last year due cost.  She has had HBA1C >14 for over a year.  He recommended a dose of 7 units of novolog today, increased fluids and in the absence of ketoacidosis to follow up with him tomorrow by phone.   Patient Active Problem List   Diagnosis Date Noted  . Type 1 diabetes mellitus 11/22/2011  . Yeast vaginitis 11/22/2011    Past Medical History  Diagnosis Date  . Insulin dependent type 1 diabetes mellitus     Past Surgical History  Procedure Laterality Date  . Mass excision Right 07/10/2013    Procedure: MINOR EXCISION RIGHT NECK CYST;  Surgeon: Drema Halonhristopher E Newman, MD;  Location: [facility];  Service: ENT;  Laterality: Right;    History  Substance Use Topics  . Smoking status: Never Smoker   . Smokeless tobacco: Not on file  . Alcohol Use: No    Family History  Problem Relation Age of Onset  . Diabetes Mother   . Migraines Mother   . Breast cancer Maternal Grandmother   . Thyroid disease Maternal Grandmother     Allergies  Allergen Reactions  . Aloe Hives  . Olive Oil Hives    Medication list has been reviewed and updated.  Current Outpatient Prescriptions on File Prior to Visit  Medication Sig Dispense Refill  . clarithromycin (BIAXIN) 500 MG tablet Take 500 mg by mouth 2 (two) times daily.      . cyclobenzaprine (FLEXERIL) 5 MG tablet Take 1 tablet (5 mg total) by mouth 3 (three) times daily as needed for muscle spasms.  30 tablet  0  . fexofenadine (ALLEGRA) 180 MG tablet Take 180 mg by mouth daily.      . insulin aspart (NOVOLOG) 100 UNIT/ML injection Inject into the skin 3 (three) times daily before meals. Sliding scale      . Norethindrone-Ethinyl Estradiol-Fe Biphas (LO LOESTRIN FE) 1 MG-10 MCG / 10 MCG tablet Take 1 tablet by mouth daily.  1 Package  10  . oxycodone-acetaminophen (PERCOCET) 2.5-325 MG per tablet Take 1-2 tablets by  mouth every 6 (six) hours as needed for pain.  24 tablet  0  . oxyCODONE-acetaminophen (PERCOCET/ROXICET) 5-325 MG per tablet Take 1 tablet by mouth every 6 (six) hours as needed for severe pain.  30 tablet  0   No current facility-administered medications on file prior to visit.    Review of Systems:  As per HPI, otherwise negative.    Physical Examination: Filed Vitals:   07/19/13 1341  BP: 118/80  Pulse: 109  Temp: 98.7 F (37.1 C)  Resp: 16   Filed Vitals:   07/19/13 1341  Height: 5' 2.5" (1.588 m)  Weight: 115 lb 9.6 oz (52.436 kg)   Body mass index is 20.79 kg/(m^2). Ideal Body Weight: Weight in (lb) to have BMI = 25: 138.6  GEN: WDWN, NAD, Non-toxic, A & O x 3 HEENT: Atraumatic,  Normocephalic. Neck supple. No masses, No LAD. Ears and Nose: No external deformity. CV: RRR, No M/G/R. No JVD. No thrill. No extra heart sounds. PULM: CTA B, no wheezes, crackles, rhonchi. No retractions. No resp. distress. No accessory muscle use. ABD: S, NT, ND, +BS. No rebound. No HSM. EXTR: No c/c/e NEURO Normal gait.  PSYCH: Normally interactive. Conversant. Not depressed or anxious appearing.  Calm demeanor.  SKIN:  No ecchymosis. No swelling Back tender paraspinous muscles lumbar  Assessment and Plan: IDDM out of control due non compliance Myalgias Stop percocet due allergy Dilaudid Follow up with Dr Sharl MaKerr in am by phone RED flags discussed with mom and patient   Signed,  Phillips OdorJeffery Amaurie Wandel, MD   Results for orders placed in visit on 07/19/13  POCT GLYCOSYLATED HEMOGLOBIN (HGB A1C)      Result Value Ref Range   Hemoglobin A1C >14    GLUCOSE, POCT (MANUAL RESULT ENTRY)      Result Value Ref Range   POC Glucose HHH  70 - 99 mg/dl  POCT URINALYSIS DIPSTICK      Result Value Ref Range   Color, UA yellow     Clarity, UA clear     Glucose, UA >500     Bilirubin, UA neg     Ketones, UA neg     Spec Grav, UA 1.010     Blood, UA trace intact     pH, UA 7.0     Protein, UA neg     Urobilinogen, UA 0.2     Nitrite, UA neg     Leukocytes, UA Negative    POCT UA - MICROSCOPIC ONLY      Result Value Ref Range   WBC, Ur, HPF, POC Neg     RBC, urine, microscopic 0-2     Bacteria, U Microscopic Neg     Mucus, UA Neg     Epithelial cells, urine per micros 1-3     Crystals, Ur, HPF, POC neg     Casts, Ur, LPF, POC neg     Yeast, UA neg

## 2013-07-19 NOTE — Patient Instructions (Signed)
Type 1 Diabetes Mellitus, Adult Type 1 diabetes mellitus, often simply referred to as diabetes, is a long-term (chronic) disease. It occurs when the islet cells in the pancreas that make insulin (a hormone) are destroyed and can no longer make insulin. Insulin is needed to move sugars from food into the tissue cells. The tissue cells use the sugars for energy. In people with type 1 diabetes, the sugars build up in the blood instead of going into the tissue cells. As a result, high blood sugar (hyperglycemia) develops. Without insulin, the body breaks down fat cells for the needed energy. This breakdown of fat cells produces acid chemicals (ketones), which increases the acid levels in the body. The effect of either high ketone or sugar (glucose) levels can be life-threatening.  Type 1 diabetes was also previously called juvenile diabetes. It most often occurs before the age of 30, but it can occur at any age. RISK FACTORS A person is predisposed to developing type 1 diabetes if someone in his or her family has the disease and is exposed to certain additional environmental triggers.  SYMPTOMS  Symptoms of type 1 diabetes may develop gradually over days to weeks or suddenly. The symptoms occur due to hyperglycemia. The symptoms can include:   Increased thirst (polydipsia).  Increased urination (polyuria).  Increased urination during the night (nocturia).  Weight loss. This weight loss may be rapid.  Frequent, recurring infections.  Tiredness (fatigue).  Weakness.  Vision changes, such as blurred vision.  Fruity smell to your breath.  Abdominal pain.  Nausea or vomiting. DIAGNOSIS  Type 1 diabetes is diagnosed when symptoms of diabetes are present and when blood glucose levels are increased. Your blood glucose level may be checked by one or more of the following blood tests:  A fasting blood glucose test. You will not be allowed to eat for at least 8 hours before a blood sample is  taken.  A random blood glucose test. Your blood glucose is checked at any time of the day regardless of when you ate.  A hemoglobin A1c blood glucose test. A hemoglobin A1c test provides information about blood glucose control over the previous 3 months. TREATMENT  Although type 1 diabetes cannot be prevented, it can be managed with insulin, diet, and exercise.  You will need to take insulin daily to keep blood glucose in the desired range.  You will need to match insulin dosing with exercise and healthy food choices. The treatment goal is to maintain the before-meal blood sugar (preprandial glucose) level at 70 130 mg/dL.  HOME CARE INSTRUCTIONS   Have your hemoglobin A1c level checked twice a year.  Perform daily blood glucose monitoring as directed by your caregiver.  Monitor urine ketones when you are ill and as directed by your caregiver.  Take your insulin as directed by your caregiver to maintain your blood glucose level in the desired range.  Never run out of insulin. It is needed every day.  Adjust insulin based on your intake of carbohydrates. Carbohydrates can raise blood glucose levels but need to be included in your diet. Carbohydrates provide vitamins, minerals, and fiber, which are an essential part of a healthy diet. Carbohydrates are found in fruits, vegetables, whole grains, dairy products, legumes, and foods containing added sugars.    Eat healthy foods. Alternate 3 meals with 3 snacks.  Maintain a healthy weight.  Carry a medical alert card or wear your medical alert jewelry.  Carry a 15 gram carbohydrate snack with you   at all times to treat low blood glucose (hypoglycemia). Some examples of 15 gram carbohydrate snacks include:  Glucose tablets, 3 or 4.   Glucose gel, 15 gram tube.  Raisins, 2 tablespoons (24 grams).  Jelly beans, 6.  Animal crackers, 8.  Fruit juice, regular soda, or low-fat milk, 4 ounces (120 mL).  Gummy treats,  9.    Recognize hypoglycemia. Hypoglycemia occurs with blood glucose levels of 70 mg/dL and below. The risk for hypoglycemia increases when fasting or skipping meals, during or after intense exercise, and during sleep. Hypoglycemia symptoms can include:  Tremors or shakes.  Decreased ability to concentrate.  Sweating.  Increased heart rate.  Headache.  Dry mouth.  Hunger.  Irritability.  Anxiety.  Restless sleep.  Altered speech or coordination.  Confusion.  Treat hypoglycemia promptly. If you are alert and able to safely swallow, follow the 15:15 rule:  Take 15 20 grams of rapid-acting glucose or carbohydrate. Rapid-acting options include glucose gel, glucose tablets, or 4 ounces (120 mL) of fruit juice, regular soda, or low-fat milk.  Check your blood glucose level 15 minutes after taking the glucose.   Take 15 20 grams more of glucose if the repeat blood glucose level is still 70 mg/dL or below.  Eat a meal or snack within 1 hour once blood glucose levels return to normal.  Be alert to polyuria and polydipsia, which are early signs of hyperglycemia. An early awareness of hyperglycemia allows for prompt treatment. Treat hyperglycemia as directed by your caregiver.  Engage in at least 150 minutes of moderate-intensity physical activity a week, spread over at least 3 days of the week or as directed by your caregiver.  Adjust your insulin dosing and food intake as needed if you start a new exercise or sport.  Follow your sick day plan at any time you are unable to eat or drink as usual.   Avoid tobacco use.  Limit alcohol intake to no more than 1 drink per day for nonpregnant women and 2 drinks per day for men. You should drink alcohol only when you are also eating food. Talk with your caregiver about whether alcohol is safe for you. Tell your caregiver if you drink alcohol several times a week.  Follow up with your caregiver regularly.  Schedule an eye exam  within 5 years of diagnosis and then annually.  Perform daily skin and foot care. Examine your skin and feet daily for cuts, bruises, redness, nail problems, bleeding, blisters, or sores. A foot exam by a caregiver should be done annually.  Brush your teeth and gums at least twice a day and floss at least once a day. Follow up with your dentist regularly.  Share your diabetes management plan with your workplace or school.  Stay up-to-date with immunizations.  Learn to manage stress.  Obtain ongoing diabetes education and support as needed.  Participate or seek rehabilitation as needed to maintain or improve independence and quality of life. Request a physical or occupational therapy referral if you are having foot or hand numbness or difficulties with grooming, dressing, eating, or physical activity. SEEK MEDICAL CARE IF:   You are unable to eat food or drink fluids for more than 6 hours.  You have nausea and vomiting for more than 6 hours.  Your blood glucose level is over 240 mg/dL.  There is a change in mental status.  You develop an additional serious illness.  You have diarrhea for more than 6 hours.  You have been   sick or have had a fever for a couple of days and are not getting better.  You have pain during any physical activity. SEEK IMMEDIATE MEDICAL CARE IF:  You have difficulty breathing.  You have moderate to large ketone levels. MAKE SURE YOU:  Understand these instructions.  Will watch your condition.  Will get help right away if you are not doing well or get worse. Document Released: 02/20/2000 Document Revised: 11/17/2011 Document Reviewed: 09/21/2011 Exit[employer] Patient Information 2014 [employer].  

## 2013-07-25 ENCOUNTER — Encounter (HOSPITAL_COMMUNITY): Payer: Self-pay | Admitting: Emergency Medicine

## 2013-07-25 DIAGNOSIS — Z803 Family history of malignant neoplasm of breast: Secondary | ICD-10-CM

## 2013-07-25 DIAGNOSIS — Z794 Long term (current) use of insulin: Secondary | ICD-10-CM

## 2013-07-25 DIAGNOSIS — L723 Sebaceous cyst: Secondary | ICD-10-CM | POA: Diagnosis present

## 2013-07-25 DIAGNOSIS — R000 Tachycardia, unspecified: Secondary | ICD-10-CM | POA: Diagnosis present

## 2013-07-25 DIAGNOSIS — Z833 Family history of diabetes mellitus: Secondary | ICD-10-CM

## 2013-07-25 DIAGNOSIS — M62838 Other muscle spasm: Secondary | ICD-10-CM | POA: Diagnosis present

## 2013-07-25 DIAGNOSIS — E1010 Type 1 diabetes mellitus with ketoacidosis without coma: Principal | ICD-10-CM | POA: Diagnosis present

## 2013-07-25 DIAGNOSIS — Z79899 Other long term (current) drug therapy: Secondary | ICD-10-CM

## 2013-07-25 NOTE — ED Notes (Signed)
Patient states about 3 weeks ago she fell out of the shower and has been c/o back spasms and generalized body aches

## 2013-07-26 ENCOUNTER — Encounter (HOSPITAL_COMMUNITY): Payer: Self-pay | Admitting: Internal Medicine

## 2013-07-26 ENCOUNTER — Inpatient Hospital Stay (HOSPITAL_COMMUNITY)
Admission: EM | Admit: 2013-07-26 | Discharge: 2013-07-26 | DRG: 639 | Disposition: A | Discharge status: Home / Self Care | Payer: PRIVATE HEALTH INSURANCE | Attending: Internal Medicine | Admitting: Internal Medicine

## 2013-07-26 DIAGNOSIS — T148XXA Other injury of unspecified body region, initial encounter: Secondary | ICD-10-CM

## 2013-07-26 DIAGNOSIS — M542 Cervicalgia: Secondary | ICD-10-CM

## 2013-07-26 DIAGNOSIS — B373 Candidiasis of vulva and vagina: Secondary | ICD-10-CM

## 2013-07-26 DIAGNOSIS — E1110 Type 2 diabetes mellitus with ketoacidosis without coma: Secondary | ICD-10-CM | POA: Diagnosis present

## 2013-07-26 DIAGNOSIS — R519 Headache, unspecified: Secondary | ICD-10-CM

## 2013-07-26 DIAGNOSIS — M25569 Pain in unspecified knee: Secondary | ICD-10-CM

## 2013-07-26 DIAGNOSIS — E872 Acidosis, unspecified: Secondary | ICD-10-CM

## 2013-07-26 DIAGNOSIS — B3731 Acute candidiasis of vulva and vagina: Secondary | ICD-10-CM

## 2013-07-26 DIAGNOSIS — E109 Type 1 diabetes mellitus without complications: Secondary | ICD-10-CM

## 2013-07-26 DIAGNOSIS — M62838 Other muscle spasm: Secondary | ICD-10-CM

## 2013-07-26 DIAGNOSIS — R51 Headache: Secondary | ICD-10-CM

## 2013-07-26 LAB — CBG MONITORING, ED
Glucose-Capillary: 341 mg/dL — ABNORMAL HIGH (ref 70–99)
Glucose-Capillary: 274 mg/dL — ABNORMAL HIGH (ref 70–99)
Glucose-Capillary: 261 mg/dL — ABNORMAL HIGH (ref 70–99)
Glucose-Capillary: 203 mg/dL — ABNORMAL HIGH (ref 70–99)
Glucose-Capillary: 166 mg/dL — ABNORMAL HIGH (ref 70–99)
Glucose-Capillary: 159 mg/dL — ABNORMAL HIGH (ref 70–99)
Glucose-Capillary: 127 mg/dL — ABNORMAL HIGH (ref 70–99)
Glucose-Capillary: 133 mg/dL — ABNORMAL HIGH (ref 70–99)
Glucose-Capillary: 156 mg/dL — ABNORMAL HIGH (ref 70–99)
Glucose-Capillary: 130 mg/dL — ABNORMAL HIGH (ref 70–99)

## 2013-07-26 LAB — CBC
HCT: 35.1 % — ABNORMAL LOW (ref 36.0–46.0)
Hemoglobin: 12 g/dL (ref 12.0–15.0)
MCH: 31.9 pg (ref 26.0–34.0)
MCHC: 34.2 g/dL (ref 30.0–36.0)
MCV: 93.4 fL (ref 78.0–100.0)
Platelets: 278 10*3/uL (ref 150–400)
RBC: 3.76 MIL/uL — ABNORMAL LOW (ref 3.87–5.11)
RDW: 11.7 % (ref 11.5–15.5)
WBC: 4 10*3/uL (ref 4.0–10.5)

## 2013-07-26 LAB — URINE MICROSCOPIC-ADD ON

## 2013-07-26 LAB — BASIC METABOLIC PANEL
BUN: 6 mg/dL (ref 6–23)
BUN: 5 mg/dL — ABNORMAL LOW (ref 6–23)
BUN: 4 mg/dL — ABNORMAL LOW (ref 6–23)
BUN: 4 mg/dL — ABNORMAL LOW (ref 6–23)
CO2: 18 mEq/L — ABNORMAL LOW (ref 19–32)
CO2: 21 mEq/L (ref 19–32)
CO2: 20 mEq/L (ref 19–32)
CO2: 19 mEq/L (ref 19–32)
Calcium: 7.8 mg/dL — ABNORMAL LOW (ref 8.4–10.5)
Calcium: 7.9 mg/dL — ABNORMAL LOW (ref 8.4–10.5)
Calcium: 7.8 mg/dL — ABNORMAL LOW (ref 8.4–10.5)
Calcium: 7.5 mg/dL — ABNORMAL LOW (ref 8.4–10.5)
Chloride: 103 mEq/L (ref 96–112)
Chloride: 108 mEq/L (ref 96–112)
Chloride: 110 mEq/L (ref 96–112)
Chloride: 107 mEq/L (ref 96–112)
Creatinine, Ser: 0.32 mg/dL — ABNORMAL LOW (ref 0.50–1.10)
Creatinine, Ser: 0.3 mg/dL — ABNORMAL LOW (ref 0.50–1.10)
Creatinine, Ser: 0.28 mg/dL — ABNORMAL LOW (ref 0.50–1.10)
Creatinine, Ser: 0.27 mg/dL — ABNORMAL LOW (ref 0.50–1.10)
GFR calc Af Amer: >90 mL/min (ref >90)
GFR calc Af Amer: >90 mL/min (ref >90)
GFR calc Af Amer: >90 mL/min (ref >90)
GFR calc Af Amer: >90 mL/min (ref >90)
GFR calc non Af Amer: >90 mL/min (ref >90)
GFR calc non Af Amer: >90 mL/min (ref >90)
GFR calc non Af Amer: >90 mL/min (ref >90)
GFR calc non Af Amer: >90 mL/min (ref >90)
Glucose, Bld: 306 mg/dL — ABNORMAL HIGH (ref 70–99)
Glucose, Bld: 161 mg/dL — ABNORMAL HIGH (ref 70–99)
Glucose, Bld: 129 mg/dL — ABNORMAL HIGH (ref 70–99)
Glucose, Bld: 179 mg/dL — ABNORMAL HIGH (ref 70–99)
Potassium: 3.9 mEq/L (ref 3.7–5.3)
Potassium: 3.5 mEq/L — ABNORMAL LOW (ref 3.7–5.3)
Potassium: 3.5 mEq/L — ABNORMAL LOW (ref 3.7–5.3)
Potassium: 3.7 mEq/L (ref 3.7–5.3)
Sodium: 137 mEq/L (ref 137–147)
Sodium: 138 mEq/L (ref 137–147)
Sodium: 142 mEq/L (ref 137–147)
Sodium: 137 mEq/L (ref 137–147)

## 2013-07-26 LAB — MAGNESIUM: Magnesium: 1.7 mg/dL (ref 1.5–2.5)

## 2013-07-26 LAB — CBC WITH DIFFERENTIAL/PLATELET
Basophils Absolute: 0 10*3/uL (ref 0.0–0.1)
Basophils Relative: 0 % (ref 0–1)
Eosinophils Absolute: 0.2 10*3/uL (ref 0.0–0.7)
Eosinophils Relative: 4 % (ref 0–5)
HCT: 38.6 % (ref 36.0–46.0)
Hemoglobin: 13.2 g/dL (ref 12.0–15.0)
Lymphocytes Relative: 44 % (ref 12–46)
Lymphs Abs: 2.1 10*3/uL (ref 0.7–4.0)
MCH: 31.8 pg (ref 26.0–34.0)
MCHC: 34.2 g/dL (ref 30.0–36.0)
MCV: 93 fL (ref 78.0–100.0)
Monocytes Absolute: 0.3 10*3/uL (ref 0.1–1.0)
Monocytes Relative: 5 % (ref 3–12)
Neutro Abs: 2.2 10*3/uL (ref 1.7–7.7)
Neutrophils Relative %: 47 % (ref 43–77)
Platelets: 312 10*3/uL (ref 150–400)
RBC: 4.15 MIL/uL (ref 3.87–5.11)
RDW: 11.7 % (ref 11.5–15.5)
WBC: 4.8 10*3/uL (ref 4.0–10.5)

## 2013-07-26 LAB — URINALYSIS, ROUTINE W REFLEX MICROSCOPIC
Bilirubin Urine: NEGATIVE
Glucose, UA: >1000 mg/dL — AB
Hgb urine dipstick: NEGATIVE
Ketones, ur: >80 mg/dL — AB
Leukocytes, UA: NEGATIVE
Nitrite: NEGATIVE
Protein, ur: NEGATIVE mg/dL
Specific Gravity, Urine: 1.042 — ABNORMAL HIGH (ref 1.005–1.030)
Urobilinogen, UA: 0.2 mg/dL (ref 0.0–1.0)
pH: 6.5 (ref 5.0–8.0)

## 2013-07-26 LAB — COMPREHENSIVE METABOLIC PANEL
ALT: 12 U/L (ref 0–35)
AST: 12 U/L (ref 0–37)
Albumin: 3.4 g/dL — ABNORMAL LOW (ref 3.5–5.2)
Alkaline Phosphatase: 50 U/L (ref 39–117)
BUN: 7 mg/dL (ref 6–23)
CO2: 21 mEq/L (ref 19–32)
Calcium: 9 mg/dL (ref 8.4–10.5)
Chloride: 96 mEq/L (ref 96–112)
Creatinine, Ser: 0.37 mg/dL — ABNORMAL LOW (ref 0.50–1.10)
GFR calc Af Amer: >90 mL/min (ref >90)
GFR calc non Af Amer: >90 mL/min (ref >90)
Glucose, Bld: 383 mg/dL — ABNORMAL HIGH (ref 70–99)
Potassium: 4 mEq/L (ref 3.7–5.3)
Sodium: 134 mEq/L — ABNORMAL LOW (ref 137–147)
Total Bilirubin: 0.2 mg/dL — ABNORMAL LOW (ref 0.3–1.2)
Total Protein: 7 g/dL (ref 6.0–8.3)

## 2013-07-26 LAB — I-STAT VENOUS BLOOD GAS, ED
Acid-base deficit: 6 mmol/L — ABNORMAL HIGH (ref 0.0–2.0)
Bicarbonate: 18.8 mEq/L — ABNORMAL LOW (ref 20.0–24.0)
O2 Saturation: 87 %
TCO2: 20 mmol/L (ref 0–100)
pCO2, Ven: 33.5 mmHg — ABNORMAL LOW (ref 45.0–50.0)
pH, Ven: 7.357 — ABNORMAL HIGH (ref 7.250–7.300)
pO2, Ven: 54 mmHg — ABNORMAL HIGH (ref 30.0–45.0)

## 2013-07-26 LAB — T4, FREE: Free T4: 1.08 ng/dL (ref 0.80–1.80)

## 2013-07-26 LAB — T3, FREE: T3, Free: 3.5 pg/mL (ref 2.3–4.2)

## 2013-07-26 LAB — TROPONIN I: Troponin I: <0.3 ng/mL (ref <0.30)

## 2013-07-26 LAB — HEMOGLOBIN A1C
Hgb A1c MFr Bld: 15.4 % — ABNORMAL HIGH (ref <5.7)
Mean Plasma Glucose: 395 mg/dL — ABNORMAL HIGH (ref <117)

## 2013-07-26 LAB — TSH: TSH: 2.14 u[IU]/mL (ref 0.350–4.500)

## 2013-07-26 LAB — POC URINE PREG, ED: Preg Test, Ur: NEGATIVE

## 2013-07-26 MED ORDER — OXYCODONE-ACETAMINOPHEN 5-325 MG PO TABS: 1.0000 | ORAL_TABLET | Freq: Four times a day (QID) | ORAL | Status: DC | PRN

## 2013-07-26 MED ORDER — HYDROMORPHONE HCL PF 1 MG/ML IJ SOLN
1.0000 mg | INTRAMUSCULAR | Status: DC | PRN
  Administered 2013-07-26 (×2): 1 mg via INTRAVENOUS
  Filled 2013-07-26 (×2): qty 1

## 2013-07-26 MED ORDER — SODIUM CHLORIDE 0.9 % IV BOLUS (SEPSIS)
1000.0000 mL | Freq: Once | INTRAVENOUS | Status: AC
  Administered 2013-07-26: 1000 mL via INTRAVENOUS

## 2013-07-26 MED ORDER — ENOXAPARIN SODIUM 40 MG/0.4ML ~~LOC~~ SOLN
40.0000 mg | SUBCUTANEOUS | Status: DC
  Filled 2013-07-26: qty 0.4

## 2013-07-26 MED ORDER — DEXTROSE 50 % IV SOLN: 25.0000 mL | INTRAVENOUS | Status: DC | PRN

## 2013-07-26 MED ORDER — CYCLOBENZAPRINE HCL 10 MG PO TABS
5.0000 mg | ORAL_TABLET | Freq: Two times a day (BID) | ORAL | Status: DC | PRN
  Administered 2013-07-26: 5 mg via ORAL
  Filled 2013-07-26: qty 1

## 2013-07-26 MED ORDER — DEXTROSE-NACL 5-0.45 % IV SOLN
INTRAVENOUS | Status: DC
Start: 2013-07-26 — End: 2013-07-26

## 2013-07-26 MED ORDER — SODIUM CHLORIDE 0.9 % IV SOLN
INTRAVENOUS | Status: DC
  Filled 2013-07-26: qty 1

## 2013-07-26 MED ORDER — SODIUM CHLORIDE 0.9 % IV SOLN
INTRAVENOUS | Status: DC
  Administered 2013-07-26: 2.1 [IU]/h via INTRAVENOUS
  Filled 2013-07-26: qty 1

## 2013-07-26 MED ORDER — DEXTROSE-NACL 5-0.45 % IV SOLN
INTRAVENOUS | Status: DC
  Administered 2013-07-26: 10:00:00 via INTRAVENOUS

## 2013-07-26 MED ORDER — CYCLOBENZAPRINE HCL 10 MG PO TABS: 10.0000 mg | ORAL_TABLET | Freq: Three times a day (TID) | ORAL | Status: DC | PRN

## 2013-07-26 MED ORDER — POTASSIUM CHLORIDE 10 MEQ/100ML IV SOLN
10.0000 meq | INTRAVENOUS | Status: AC
  Administered 2013-07-26: 10 meq via INTRAVENOUS
  Filled 2013-07-26: qty 100

## 2013-07-26 MED ORDER — ACETAMINOPHEN 500 MG PO TABS
1000.0000 mg | ORAL_TABLET | Freq: Once | ORAL | Status: AC
  Administered 2013-07-26: 1000 mg via ORAL
  Filled 2013-07-26: qty 2

## 2013-07-26 MED ORDER — SODIUM CHLORIDE 0.9 % IV SOLN: INTRAVENOUS | Status: DC

## 2013-07-26 MED ORDER — CYCLOBENZAPRINE HCL 5 MG PO TABS: 5.0000 mg | ORAL_TABLET | Freq: Two times a day (BID) | ORAL | Status: DC | PRN

## 2013-07-26 NOTE — H&P (Signed)
[facility] History and Physical  Theresa GaudyCourtney R Geiselman ZOX:[id number] DOB: 06/19/1990 DOA: 07/26/2013  Referring physician: ER physician. PCP: Evlyn CourierHILL,GERALD K, MD   Chief Complaint: Muscle cramps.  HPI: Theresa [name] is a 23 y.o. female history of diabetes mellitus type 1 has had a recent right infected neck cyst removed and has had her last dose of antibiotics yesterday presents to the ER because of cramping pain and upper back. In the ER patient was found to be tachycardic and labs revealed patient is an DKA. Patient states that she has not missed her medications. Denies any chest pain nausea vomiting abdominal pain fever chills diarrhea. She has mild pain around the surgical site but denies any difficulty swallowing or moving her neck. Patient has been started on IV insulin infusion and IV fluids for DKA and admitted for further workup.   Review of Systems: As presented in the history of presenting illness, rest negative.  Past Medical History  Diagnosis Date  . Insulin dependent type 1 diabetes mellitus    Past Surgical History  Procedure Laterality Date  . Mass excision Right 07/10/2013    Procedure: MINOR EXCISION RIGHT NECK CYST;  Surgeon: Drema Halonhristopher E Newman, MD;  Location: [facility];  Service: ENT;  Laterality: Right;   Social History:  reports that she has never smoked. She does not have any smokeless tobacco history on file. She reports that she does not drink alcohol or use illicit drugs. Where does patient live home. Can patient participate in ADLs? Yes.  Allergies  Allergen Reactions  . Aloe Hives  . Olive Oil Hives    Family History:  Family History  Problem Relation Age of Onset  . Diabetes Mother   . Migraines Mother   . Breast cancer Maternal Grandmother   . Thyroid disease Maternal Grandmother       Prior to Admission medications   Medication Sig Start Date End Date Taking? Authorizing Provider  fexofenadine (ALLEGRA) 180 MG tablet Take  180 mg by mouth daily.   Yes Historical Provider, MD  insulin aspart (NOVOLOG) 100 UNIT/ML injection Inject into the skin 3 (three) times daily before meals. Sliding scale   Yes Historical Provider, MD  naproxen sodium (ANAPROX DS) 550 MG tablet Take 1 tablet (550 mg total) by mouth 2 (two) times daily with a meal. 07/19/13 07/19/14 Yes Phillips OdorJeffery Anderson, MD  Norethindrone-Ethinyl Estradiol-Fe Biphas (LO LOESTRIN FE) 1 MG-10 MCG / 10 MCG tablet Take 1 tablet by mouth daily. 02/27/13  Yes Harrington ChallengerNancy J Young, NP  oxyCODONE-acetaminophen (PERCOCET/ROXICET) 5-325 MG per tablet Take 1 tablet by mouth every 6 (six) hours as needed for severe pain. 07/14/13  Yes Thao P Le, DO  HYDROmorphone (DILAUDID) 2 MG tablet Take 1 tablet (2 mg total) by mouth every 4 (four) hours as needed for severe pain. 07/19/13   Phillips OdorJeffery Anderson, MD    Physical Exam: Filed Vitals:   07/26/13 [id number] 07/26/13 0645 07/26/13 0700 07/26/13 0715  BP: 116/81 113/78 140/88 142/97  Pulse: 117 117 113 121  Temp:      TempSrc:      Resp: 16     Height:      Weight:      SpO2: 100% 100% 100% 100%     General:  Not in acute distress.  Eyes: Anicteric no pallor.  ENT: No discharge from the ears eyes nose mouth.  Neck: There is scar mark on the right side of the neck.  Cardiovascular: S1-S2 heard. Tachycardic.  Respiratory: No rhonchi or crepitations.  Abdomen: Soft nontender bowel sounds present. No guarding or rigidity.  Skin: Scar mark from recent surgery on the neck right side.  Musculoskeletal: No edema.  Psychiatric: Appears normal.  Neurologic: Alert awake oriented to time place and person. Moves all extremities.  Labs on Admission:  Basic Metabolic Panel:  Recent Labs Lab 07/19/13 1452 07/26/13 0334 07/26/13 0625  NA 133* 134* 137  K 4.5 4.0 3.9  CL 97 96 103  CO2 26 21 18*  GLUCOSE 476* 383* 306*  BUN 6 7 6   CREATININE 0.57 0.37* 0.32*  CALCIUM 9.2 9.0 7.8*  MG 1.8 1.7  --    Liver Function  Tests:  Recent Labs Lab 07/19/13 1452 07/26/13 0334  AST 11 12  ALT 9 12  ALKPHOS 48 50  BILITOT 0.4 0.2*  PROT 6.6 7.0  ALBUMIN 3.8 3.4*   No results found for this basename: LIPASE, AMYLASE,  in the last 168 hours No results found for this basename: AMMONIA,  in the last 168 hours CBC:  Recent Labs Lab 07/26/13 0334  WBC 4.8  NEUTROABS 2.2  HGB 13.2  HCT 38.6  MCV 93.0  PLT 312   Cardiac Enzymes: No results found for this basename: CKTOTAL, CKMB, CKMBINDEX, TROPONINI,  in the last 168 hours  BNP (last 3 results) No results found for this basename: PROBNP,  in the last 8760 hours CBG:  Recent Labs Lab 07/26/13 0331 07/26/13 0734  GLUCAP 341* 274*    Radiological Exams on Admission: No results found.   Assessment/Plan Principal Problem:   DKA (diabetic ketoacidoses)   1. Diabetic ketoacidosis - not sure what precipitated patient's DKA. At this time patient has been placed on IV insulin infusion and IV fluids. Change to long-acting insulin once anion gap gets corrected. Check hemoglobin A1c. 2. Recent surgery for right sided infected neck cyst - I did discuss with Dr. Dillard Cannonhristopher Newman, patient's ENT surgeon. Dr. Dillard Cannonhristopher Newman advised no further antibiotics needed at this time. 3. Tachycardia - probably from #1 reason. EKG and thyroid function tests are pending.   Code Status: Full code.  Family Communication: Family at the bedside.  Disposition Plan: Admit to inpatient.    Eduard ClosArshad N Tawana Pasch [facility] Pager [phone].  If 7PM-7AM, please contact night-coverage [email or web address] Password [email or web address] 07/26/2013, 7:44 AM

## 2013-07-26 NOTE — Progress Notes (Signed)
   [facility]                                                                              Patient Demographics  Theresa [name], is a 23 y.o. female, DOB - 04/16/1990, ZOX:[id number]  Admit date - 07/26/2013   Admitting Physician Eduard ClosArshad N Kakrakandy, MD  Outpatient Primary MD for the patient is Evlyn CourierHILL,GERALD K, MD  LOS - 0   Chief Complaint  Patient presents with  . Generalized Body Aches      HPI Theresa [name] is a 23 y.o. female history of diabetes mellitus type 1 has had a recent right infected neck cyst removed and has had her last dose of antibiotics yesterday, presented to the ER because of cramping pain in her upper back. In the ER patient was found to be tachycardic and labs revealed patient was in DKA. Patient stated that she has not missed her medications. Denied any chest pain, nausea, vomiting, abdominal pain, fever, chills, diarrhea. She has mild pain around the surgical site but denied any difficulty swallowing or moving her neck. Patient was been started on IV insulin infusion and IV fluids for DKA and admitted for further workup.    Assessment & Plan   Patient seen and examined.  Admitted early this morning.  Agree with currently assessment and plan by Dr. Toniann FailKakrakandy.  DKA -Patient states her sugars have been uncontrolled for the past 2 months.  -She recently saw her endocrinologist, and her regimen was changed yesterday -Possibly uncontrolled sugars secondary to infected right neck cyst -Currently on glucose stabilizer  -NPO -Continue IVF with potassium supplementation -Continue BMP and CBG monitoring -Anion gap currently trending downward -Last HbA1c >14 (07/19/13)  Generalized muscle aches/spasms -Possibly secondary to uncontrolled diabetes -Will continue pain control and add flexeril PRN  Neck Cyst, right -S/p surgery by Dr. Dillard Cannonhristopher Newman -Admitting physician spoke with Dr. Ezzard StandingNewman, and he recommended no antibiotics at this  time  Tachycardia -TSH pending -Likely secondary to DKA  Code Status: Full  Family Communication: Family at bedside  Disposition Plan: Admitted, likely discharge 07/27/13  Time Spent in minutes   30 minutes  Procedures None  Consults  None  DVT Prophylaxis  Lovenox   Theresa [name] D.O. on 07/26/2013 at 2:03 PM  Between 7am to 7pm - Pager - [phone]  After 7pm go to [email or web address] - password [email or web address]  And look for the night coverage person covering for me after hours  [facility] Office  [phone]

## 2013-07-26 NOTE — Discharge Summary (Addendum)
Physician Discharge Summary  Theresa [name]:[id number] DOB: 12-30-1990 DOA: 07/26/2013  PCP: Evlyn Courier, MD  Admit date: 07/26/2013 Discharge date: 07/26/2013  Time spent: 45 minutes  Recommendations for Outpatient Follow-up:  Patient will be discharged home. She is to follow up with her primary care physician within one week of discharge. Patient should continue her medications as prescribed. If her symptoms are returning, or her blood sugars remained elevated and uncontrolled, patient should return to emergency department.  Discharge Diagnoses:  DKA (diabetic ketoacidoses) Generalized muscle aches/spasms Right neck cyst Tachycardia  Discharge Condition: Stable  Diet recommendation: Carb modified  Filed Weights   07/25/13 2233  Weight: 51.71 kg (114 lb)    History of present illness:  Theresa [name] is a 23 y.o. female history of diabetes mellitus type 1 has had a recent right infected neck cyst removed and has had her last dose of antibiotics yesterday, presented to the ER because of cramping pain in her upper back. In the ER patient was found to be tachycardic and labs revealed patient was in DKA. Patient stated that she has not missed her medications. Denied any chest pain, nausea, vomiting, abdominal pain, fever, chills, diarrhea. She has mild pain around the surgical site but denied any difficulty swallowing or moving her neck. Patient was been started on IV insulin infusion and IV fluids for DKA and admitted for further workup.   Hospital Course:  DKA  -Patient states her sugars have been uncontrolled for the past 2 months.  -She recently saw her endocrinologist, and her regimen was changed yesterday  -Possibly uncontrolled sugars secondary to infected right neck cyst  -Currently on glucose stabilizer  -Initially placed on glucose stabilizer, IV fluids with potassium supplementation, n.p.o. Status. -Patient was weaned off of the glucose stabilizer, gap has now  closed. -Patient has been able to tolerate a diet. -Last HbA1c >14 (07/19/13)  -Patient will be discharged, she is to continue her medications as prescribed. (Levemir and Novolog) -Patient is to follow up with her primary care physician and endocrinologist.  Generalized muscle aches/spasms  -Possibly secondary to uncontrolled diabetes  -Will continue pain control and add flexeril PRN   Neck Cyst, right  -S/p surgery by Dr. Dillard Cannon  -Admitting physician spoke with Dr. Ezzard Standing, and he recommended no antibiotics at this time   Tachycardia  -TSH 2.140 -Likely secondary to DKA  Procedures: None  Consultations: ENT, via phone  Discharge Exam: Filed Vitals:   07/26/13 1615  BP: 129/85  Pulse: 95  Temp:   Resp: 18     General: Well developed, well nourished, NAD, appears stated age  HEENT: NCAT, PERRLA, EOMI, Anicteic Sclera, mucous membranes moist.   Neck: Supple, no JVD, no masses  Cardiovascular: S1 S2 auscultated, no rubs, murmurs or gallops. RRR  Respiratory: Clear to auscultation bilaterally with equal chest rise  Abdomen: Soft, nontender, nondistended, + bowel sounds  Extremities: warm dry without cyanosis clubbing or edema  Neuro: AAOx3, cranial nerves grossly intact. Strength 5/5 in patient's upper and lower extremities bilaterally  Skin: Without rashes exudates or nodules  Psych: Normal affect and demeanor with intact judgement and insight  Discharge Instructions      Discharge Instructions   Diet Carb Modified    Complete by:  As directed      Discharge instructions    Complete by:  As directed   Patient will be discharged home. She is to follow up with her primary care physician within one week of discharge.  Patient should continue her medications as prescribed. If her symptoms are returning, or her blood sugars remained elevated and uncontrolled, patient should return to emergency department.            Medication List          cyclobenzaprine 5 MG tablet  Commonly known as:  FLEXERIL  Take 1 tablet (5 mg total) by mouth 2 (two) times daily as needed for muscle spasms.     fexofenadine 180 MG tablet  Commonly known as:  ALLEGRA  Take 180 mg by mouth daily.     HYDROmorphone 2 MG tablet  Commonly known as:  DILAUDID  Take 1 tablet (2 mg total) by mouth every 4 (four) hours as needed for severe pain.     insulin aspart 100 UNIT/ML injection  Commonly known as:  novoLOG  Inject into the skin 3 (three) times daily before meals. Sliding scale     naproxen sodium 550 MG tablet  Commonly known as:  ANAPROX DS  Take 1 tablet (550 mg total) by mouth 2 (two) times daily with a meal.     Norethindrone-Ethinyl Estradiol-Fe Biphas 1 MG-10 MCG / 10 MCG tablet  Commonly known as:  LO LOESTRIN FE  Take 1 tablet by mouth daily.     oxyCODONE-acetaminophen 5-325 MG per tablet  Commonly known as:  PERCOCET/ROXICET  Take 1 tablet by mouth every 6 (six) hours as needed for severe pain.       Allergies  Allergen Reactions  . Aloe Hives  . Olive Oil Hives   Follow-up Information   Follow up with HILL,GERALD K, MD. Schedule an appointment as soon as possible for a visit in 1 week. [facility](Hospital followup)    Specialty:  Family Medicine   Contact information:   [address] [place] [address] [phone]       Schedule an appointment as soon as possible for a visit with Dillard CannonNEWMAN, CHRISTOPHER, MD.   Specialty:  Otolaryngology   Contact information:   [address] [place] [address] [phone]        The results of significant diagnostics from this hospitalization (including imaging, microbiology, ancillary and laboratory) are listed below for reference.    Significant Diagnostic Studies: Dg Skull Complete  07/14/2013   CLINICAL DATA:  fell in shower, HA  EXAM: SKULL - COMPLETE 4 + VIEW  COMPARISON:  None.  FINDINGS: There is no evidence of skull fracture or other focal bone lesions.   IMPRESSION: Negative.   Electronically Signed   By: Salome HolmesHector  Cooper M.D.   On: 07/14/2013 18:27   Dg Cervical Spine Complete  07/14/2013   CLINICAL DATA:  fell in shower  EXAM: CERVICAL SPINE  4+ VIEWS  COMPARISON:  None.  FINDINGS: There is no evidence of cervical spine fracture or prevertebral soft tissue swelling. Alignment is normal. No other significant bone abnormalities are identified.  IMPRESSION: Negative cervical spine radiographs.   Electronically Signed   By: Salome HolmesHector  Cooper M.D.   On: 07/14/2013 18:25   Dg Knee 1-2 Views Left  07/14/2013   CLINICAL DATA:  Fall.  Knee pain.  EXAM: RIGHT KNEE - 1-2 VIEW; LEFT KNEE - 1-2 VIEW  COMPARISON:  None.  FINDINGS: There is no evidence of fracture, dislocation, or joint effusion. There is no evidence of arthropathy or other focal bone abnormality. Soft tissues are unremarkable.  IMPRESSION: Negative bilateral knee radiographs.   Electronically Signed   By: Charolette ChildGeoffrey  Lamke M.D.  On: 07/14/2013 18:05   Dg Knee 1-2 Views Right  07/14/2013   CLINICAL DATA:  Fall.  Knee pain.  EXAM: RIGHT KNEE - 1-2 VIEW; LEFT KNEE - 1-2 VIEW  COMPARISON:  None.  FINDINGS: There is no evidence of fracture, dislocation, or joint effusion. There is no evidence of arthropathy or other focal bone abnormality. Soft tissues are unremarkable.  IMPRESSION: Negative bilateral knee radiographs.   Electronically Signed   By: Andreas Newport M.D.   On: 07/14/2013 18:05   Ct Soft Tissue Neck W Contrast  07/04/2013   CLINICAL DATA:  23 year old female with painful right neck mass discovered 2 months ago. Currently on antibiotics. Initial encounter.  EXAM: CT NECK WITH CONTRAST  TECHNIQUE: Multidetector CT imaging of the neck was performed using the standard protocol following the bolus administration of intravenous contrast.  CONTRAST:  75mL OMNIPAQUE IOHEXOL 300 MG/ML  SOLN  COMPARISON:  None.  FINDINGS: Negative lung apices. No superior mediastinal lymphadenopathy. Negative visualized  thorax osseous structures.  Thyroid, larynx, pharynx, parapharyngeal spaces, retropharyngeal space and sublingual space are within normal limits. Submandibular glands are within normal limits. Left parotid gland is normal. Visualized orbit soft tissues are within normal limits. Major vascular structures in the neck and at the skullbase are patent.  Visualized paranasal sinuses and mastoids are clear. No osseous abnormality identified.  Marked area of clinical concern corresponds to a superficial soft tissue lesion overlying the right parotid gland. There is associated skin thickening. The lesion is by convex and encompasses 18 x 8 x 25 mm (AP by transverse by CC) it appears to have a thick enhancing rim and small internal volume of fluid density. This does abut and slightly indent the underlying right parotid gland, but there seems to be a small intervening fat plane visible on almost all images (e.g. coronal image 57). The right parotid gland is otherwise normal except for occasional punctate calcifications (anterior superficial lobe). The soft tissues around the right EAC appear normal. The right parotid space otherwise is within normal limits. Normal CT appearance of the right stylomastoid foramen. No parotid space or regional lymphadenopathy, with right level 5 nodes only mildly larger than those on the left.  No similar lesion identified elsewhere.  IMPRESSION: 1. Right lateral neck lesion appears to arise from the dermis and is favored to represent a small abscess or infectious lesion with trace internal fluid. This does overlie and mildly indent the right parotid gland, but I do not favor it is arising from the parotid, which otherwise appears normal. Clinical followup to resolution recommended. 2. Minimal if any reactive lymph nodes in the region. 3. Otherwise normal neck CT.   Electronically Signed   By: Augusto Gamble M.D.   On: 07/04/2013 11:12    Microbiology: No results found for this or any previous  visit (from the past 240 hour(s)).   Labs: Basic Metabolic Panel:  Recent Labs Lab 07/26/13 0334 07/26/13 0625 07/26/13 1112 07/26/13 1245 07/26/13 1510  NA 134* 137 138 142 137  K 4.0 3.9 3.5* 3.5* 3.7  CL 96 103 108 110 107  CO2 21 18* 21 20 19   GLUCOSE 383* 306* 161* 129* 179*  BUN 7 6 5* 4* 4*  CREATININE 0.37* 0.32* 0.30* 0.28* 0.27*  CALCIUM 9.0 7.8* 7.9* 7.8* 7.5*  MG 1.7  --   --   --   --    Liver Function Tests:  Recent Labs Lab 07/26/13 0334  AST 12  ALT 12  ALKPHOS 50  BILITOT 0.2*  PROT 7.0  ALBUMIN 3.4*   No results found for this basename: LIPASE, AMYLASE,  in the last 168 hours No results found for this basename: AMMONIA,  in the last 168 hours CBC:  Recent Labs Lab 07/26/13 0334 07/26/13 1112  WBC 4.8 4.0  NEUTROABS 2.2  --   HGB 13.2 12.0  HCT 38.6 35.1*  MCV 93.0 93.4  PLT 312 278   Cardiac Enzymes:  Recent Labs Lab 07/26/13 1112  TROPONINI <0.30   BNP: BNP (last 3 results) No results found for this basename: PROBNP,  in the last 8760 hours CBG:  Recent Labs Lab 07/26/13 1136 07/26/13 1230 07/26/13 1332 07/26/13 1437 07/26/13 1545  GLUCAP 159* 127* 133* 156* 130*       Signed:  Maahi Lannan  [facility] 07/26/2013, 4:46 PM

## 2013-07-26 NOTE — Discharge Instructions (Signed)
Diabetic Ketoacidosis °Diabetic ketoacidosis (DKA) is a life-threatening complication of type 1 diabetes. It must be quickly recognized and treated. Treatment requires hospitalization. °CAUSES  °When there is no insulin in the body, glucose (sugar) cannot be used and the body breaks down fat for energy. When fat breaks down, acids (ketones) build up in the blood. Very high levels of glucose and high levels of acids lead to severe loss of body fluids (dehydration) and other dangerous chemical changes. This stresses your vital organs and can cause coma or death. °SYMPTOMS  °· Tiredness (fatigue). °· Weight loss. °· Excessive thirst. °· Ketones in the urine. °· Lightheadedness. °· Fruity or sweet smell on your breath. °· Excessive urination. °· Visual changes. °· Confusion or irritability. °· Feeling sick to your stomach (nauseous) or vomiting. °· Rapid breathing. °· Stomachache or belly (abdominal) pain. °DIAGNOSIS  °Your caregiver will diagnose DKA based on your history, physical exam, and blood tests. Your caregiver will check if there is another illness present which caused you to go into DKA. Most of this will be done quickly in an emergency room. °TREATMENT  °· Fluid replacement to correct dehydration. °· Insulin. °· Correction of electrolytes, such as potassium and sodium. °· Medicines (antibiotics) that kill germs for infections. °PREVENTION °· Always take your insulin. Do not skip your insulin injections. °· If you are ill, treat yourself quickly. Your body often needs more insulin to fight the illness. °· Check your blood glucose regularly. °· Check urine ketones if your blood glucose is greater than 240 milligrams per deciliter (mg/dl). °· Do not used expired or outdated insulin. °· If your blood glucose is high, drink plenty of fluids. This helps flush out ketones. °HOME CARE INSTRUCTIONS  °· If you are ill, follow the advice of your caregiver. °· To prevent loss of body fluids (dehydration), drink enough  water and fluids to keep your urine clear or pale yellow. °· If you cannot eat, alternate between drinking fluids with sugar (soda, juices, flavored gelatin) and salty fluids (broth, bouillon). °· If you can eat, follow your usual diet and drink sugar-free liquids (water, diet drinks). °· Always take your usual dose of insulin. If you cannot eat, or your glucose is getting too low, call your caregiver for further instructions. °· Continue to monitor your blood or urine ketones every 3 to 4 hours around the clock. Set your alarm clock or have someone wake you up. If you are too sick, have someone test it for you. °· Rest and avoid exercise. °SEEK MEDICAL CARE IF:  °· You have ketones in your urine or your blood glucose is higher than a level your caregiver suggests. You may need extra insulin. Call your caregiver if you need advice on adjusting your insulin. °· You cannot drink at least a tablespoon of fluid every 15 to 20 minutes. °· You have been throwing up for more than 2 hours. °· You have symptoms of DKA: °· Fruity smelling breath. °· Breathing faster or slower. °· Becoming very sleepy. °SEEK IMMEDIATE MEDICAL CARE IF:  °· You have signs of dehydration: °· Decreased urination. °· Increased thirst. °· Dry skin and mouth. °· Lightheadedness. °· Your blood glucose is very high (as advised by your caregiver) twice in a row. °· You or your child has an oral temperature above 102° F (38.9° C), not controlled by medicine. °· You pass out. °· You have chest pain and/or trouble breathing. °· You have a sudden, severe headache. °· You have sudden   weakness in one arm and/or one leg. °· You have sudden difficulty speaking and/or swallowing. °· You develop vomiting and/or diarrhea that is getting worse after 3 to 4 hours. °· You have abdominal pain. °MAKE SURE YOU:  °· Understand these instructions. °· Will watch your condition. °· Will get help right away if you are not doing well or get worse. °Document Released:  02/20/2000 Document Revised: 05/17/2011 Document Reviewed: 08/28/2008 °[employer]® Patient Information ©2014 [employer]. ° °

## 2013-07-26 NOTE — ED Notes (Signed)
Talked with Dr Nancee LiterMikhall. Turned insulin off D 5 .45  Off. Ordered food for patient. Dr Nancee LiterMikhall coming to see patient for discharge.

## 2013-07-26 NOTE — ED Notes (Signed)
Pt states that she has been having muscle spasms and they are generally all over the place. Pt states that she is taking vicoden and that is not helping her pain or aches. Pt states that the spasms get worse at night.

## 2013-07-26 NOTE — ED Provider Notes (Addendum)
CSN: [id number]     Arrival date & time 07/25/13  2220 History   First MD Initiated Contact with Patient 07/26/13 [phone]     Chief Complaint  Patient presents with  . Generalized Body Aches     (Consider location/radiation/quality/duration/timing/severity/associated sxs/prior Treatment) HPI 23 year old female presents with 3 weeks of back spasms and diffuse muscle aches. She states it started couple days after falling in the shower. She seen multiple providers and started on hydrocodone, then oxycodone, then dilaudid. Nothing seems to improve her pain. The spasms to be worse at night. She describes as spasms is in her shoulders and upper back. Her pain is not bad at this time she describes as a 7/10. Nothing seems to help the pain. She does take a muscle after that she's not know the name of states his not improved. She recently was changed to have an increased dose of NovoLog but is not had any new medicines otherwise. She is poorly controlled diabetes at baseline. No diarrhea or vomiting. No urinary symptoms. When I remarked about her heart rate being up she states that multiple providers have told her about this this seems to be a chronic issue. She states the urgent care she went to most recently recommended she may have to have a workup for rheumatoid arthritis but she has not been referred to anyone or see her primary care physician since.  Past Medical History  Diagnosis Date  . Insulin dependent type 1 diabetes mellitus    Past Surgical History  Procedure Laterality Date  . Mass excision Right 07/10/2013    Procedure: MINOR EXCISION RIGHT NECK CYST;  Surgeon: Drema Halon, MD;  Location: [facility];  Service: ENT;  Laterality: Right;   Family History  Problem Relation Age of Onset  . Diabetes Mother   . Migraines Mother   . Breast cancer Maternal Grandmother   . Thyroid disease Maternal Grandmother    History  Substance Use Topics  . Smoking status: Never  Smoker   . Smokeless tobacco: Not on file  . Alcohol Use: No   OB History   Grav Para Term Preterm Abortions TAB SAB Ect Mult Living   1    1  1         Review of Systems  Constitutional: Negative for fever and chills.  Respiratory: Negative for shortness of breath.   Cardiovascular: Negative for chest pain.  Gastrointestinal: Negative for vomiting, abdominal pain and diarrhea.  Genitourinary: Negative for dysuria.  Musculoskeletal: Positive for myalgias.       Muscle spasms  Neurological: Negative for weakness.  All other systems reviewed and are negative.     Allergies  Aloe and Olive oil  Home Medications   Prior to Admission medications   Medication Sig Start Date End Date Taking? Authorizing Provider  fexofenadine (ALLEGRA) 180 MG tablet Take 180 mg by mouth daily.   Yes Historical Provider, MD  insulin aspart (NOVOLOG) 100 UNIT/ML injection Inject into the skin 3 (three) times daily before meals. Sliding scale   Yes Historical Provider, MD  naproxen sodium (ANAPROX DS) 550 MG tablet Take 1 tablet (550 mg total) by mouth 2 (two) times daily with a meal. 07/19/13 07/19/14 Yes Phillips Odor, MD  Norethindrone-Ethinyl Estradiol-Fe Biphas (LO LOESTRIN FE) 1 MG-10 MCG / 10 MCG tablet Take 1 tablet by mouth daily. 02/27/13  Yes Harrington Challenger, NP  oxyCODONE-acetaminophen (PERCOCET/ROXICET) 5-325 MG per tablet Take 1 tablet by mouth every 6 (six) hours as  needed for severe pain. 07/14/13  Yes Thao P Le, DO  HYDROmorphone (DILAUDID) 2 MG tablet Take 1 tablet (2 mg total) by mouth every 4 (four) hours as needed for severe pain. 07/19/13   Phillips OdorJeffery Anderson, MD   BP 136/94  Pulse 123  Temp(Src) 98.6 F (37 C) (Oral)  Resp 18  Ht 5\' 2"  (1.575 m)  Wt 114 lb (51.71 kg)  BMI 20.85 kg/m2  SpO2 100%  LMP 05/13/2013 Physical Exam  Nursing note and vitals reviewed. Constitutional: She is oriented to person, place, and time. She appears well-developed and well-nourished.  HENT:  Head:  Normocephalic and atraumatic.  Right Ear: External ear normal.  Left Ear: External ear normal.  Nose: Nose normal.  Eyes: Right eye exhibits no discharge. Left eye exhibits no discharge.  Cardiovascular: Normal rate, regular rhythm and normal heart sounds.   Pulmonary/Chest: Effort normal and breath sounds normal.  Abdominal: Soft. She exhibits no distension. There is no tenderness.  Musculoskeletal:  No tenderness to back, arms, legs. No evidence of spasm or swelling. No bruising noted  Neurological: She is alert and oriented to person, place, and time.  Skin: Skin is warm and dry.    ED Course  Procedures (including critical care time) Labs Review Labs Reviewed  COMPREHENSIVE METABOLIC PANEL - Abnormal; Notable for the following:    Sodium 134 (*)    Glucose, Bld 383 (*)    Creatinine, Ser 0.37 (*)    Albumin 3.4 (*)    Total Bilirubin 0.2 (*)    All other components within normal limits  URINALYSIS, ROUTINE W REFLEX MICROSCOPIC - Abnormal; Notable for the following:    Specific Gravity, Urine 1.042 (*)    Glucose, UA >1000 (*)    Ketones, ur >80 (*)    All other components within normal limits  BASIC METABOLIC PANEL - Abnormal; Notable for the following:    CO2 18 (*)    Glucose, Bld 306 (*)    Creatinine, Ser 0.32 (*)    Calcium 7.8 (*)    All other components within normal limits  CBG MONITORING, ED - Abnormal; Notable for the following:    Glucose-Capillary 341 (*)    All other components within normal limits  I-STAT VENOUS BLOOD GAS, ED - Abnormal; Notable for the following:    pH, Ven 7.357 (*)    pCO2, Ven 33.5 (*)    pO2, Ven 54.0 (*)    Bicarbonate 18.8 (*)    Acid-base deficit 6.0 (*)    All other components within normal limits  CBC WITH DIFFERENTIAL  MAGNESIUM  URINE MICROSCOPIC-ADD ON  BLOOD GAS, VENOUS  POC URINE PREG, ED    Imaging Review No results found.   EKG Interpretation None      MDM   Final diagnoses:  Metabolic acidosis     No obvious etiology of the patient's muscle spasms and intermittent pain. However, she does have persistent tachycardia and evidence of acidosis. Given her diabetes, hyperglycemia, ketones, this is likely TKA. Her Diabetes has been poorly controlled for quite some time based on chart review. Her hyperglycemia is near 400. After 2 L of fluid I consulted the hospitalist, who recommended repeat BMP. This shows that her bicarbonate had dropped to 18. We'll continue fluids and start glucose stabilizer and admit to step down.    Audree CamelScott T Shernell Saldierna, MD 07/26/13 [phone]  After initial discussion patient wanted to leave. I went discussed with patient, she seems afraid of coming to hospital  as she's had family members have poor outcomes and is afraid of getting infections. I discussed her concerns at the bedside and discussed her worsening acidosis on her BMP. After discussion, the patient voluntarily agrees to be admitted. This seems reluctant but she states she feels is best to come in the hospital.  Audree CamelScott T Vidalia Serpas, MD 07/26/13 [phone]

## 2013-07-26 NOTE — ED Notes (Signed)
Patient cbg was 166, Nurse was informed.

## 2013-07-26 NOTE — ED Notes (Signed)
Patient discharged to home with family. NAD.  

## 2013-07-26 NOTE — ED Notes (Signed)
Consulting MD at bedside

## 2013-08-03 ENCOUNTER — Other Ambulatory Visit (HOSPITAL_COMMUNITY)
Admission: RE | Admit: 2013-08-03 | Discharge: 2013-08-03 | Disposition: A | Discharge status: Home / Self Care | Payer: No Typology Code available for payment source | Source: Ambulatory Visit | Attending: Otolaryngology | Admitting: Otolaryngology

## 2013-08-03 DIAGNOSIS — R220 Localized swelling, mass and lump, head: Secondary | ICD-10-CM | POA: Insufficient documentation

## 2013-08-03 DIAGNOSIS — R221 Localized swelling, mass and lump, neck: Principal | ICD-10-CM

## 2013-08-13 ENCOUNTER — Encounter (HOSPITAL_COMMUNITY): Payer: Self-pay

## 2013-09-12 ENCOUNTER — Emergency Department (HOSPITAL_COMMUNITY)
Admission: EM | Admit: 2013-09-12 | Discharge: 2013-09-12 | Disposition: A | Discharge status: Home / Self Care | Payer: BC Managed Care – PPO | Attending: Emergency Medicine | Admitting: Emergency Medicine

## 2013-09-12 ENCOUNTER — Encounter (HOSPITAL_COMMUNITY): Payer: Self-pay | Admitting: Emergency Medicine

## 2013-09-12 DIAGNOSIS — R079 Chest pain, unspecified: Secondary | ICD-10-CM

## 2013-09-12 DIAGNOSIS — E109 Type 1 diabetes mellitus without complications: Secondary | ICD-10-CM | POA: Insufficient documentation

## 2013-09-12 DIAGNOSIS — Z79899 Other long term (current) drug therapy: Secondary | ICD-10-CM | POA: Insufficient documentation

## 2013-09-12 DIAGNOSIS — R000 Tachycardia, unspecified: Secondary | ICD-10-CM | POA: Insufficient documentation

## 2013-09-12 DIAGNOSIS — Z794 Long term (current) use of insulin: Secondary | ICD-10-CM | POA: Insufficient documentation

## 2013-09-12 DIAGNOSIS — R109 Unspecified abdominal pain: Secondary | ICD-10-CM

## 2013-09-12 DIAGNOSIS — R110 Nausea: Secondary | ICD-10-CM | POA: Diagnosis not present

## 2013-09-12 DIAGNOSIS — R52 Pain, unspecified: Secondary | ICD-10-CM | POA: Diagnosis present

## 2013-09-12 LAB — COMPREHENSIVE METABOLIC PANEL
ALT: 7 U/L (ref 0–35)
AST: 11 U/L (ref 0–37)
Albumin: 4 g/dL (ref 3.5–5.2)
Alkaline Phosphatase: 54 U/L (ref 39–117)
Anion gap: 18 — ABNORMAL HIGH (ref 5–15)
BUN: 5 mg/dL — ABNORMAL LOW (ref 6–23)
CO2: 23 mEq/L (ref 19–32)
Calcium: 10 mg/dL (ref 8.4–10.5)
Chloride: 95 mEq/L — ABNORMAL LOW (ref 96–112)
Creatinine, Ser: 0.42 mg/dL — ABNORMAL LOW (ref 0.50–1.10)
GFR calc Af Amer: >90 mL/min (ref >90)
GFR calc non Af Amer: >90 mL/min (ref >90)
Glucose, Bld: 268 mg/dL — ABNORMAL HIGH (ref 70–99)
Potassium: 3.7 mEq/L (ref 3.7–5.3)
Sodium: 136 mEq/L — ABNORMAL LOW (ref 137–147)
Total Bilirubin: 0.3 mg/dL (ref 0.3–1.2)
Total Protein: 7.7 g/dL (ref 6.0–8.3)

## 2013-09-12 LAB — CBC WITH DIFFERENTIAL/PLATELET
Basophils Absolute: 0 10*3/uL (ref 0.0–0.1)
Basophils Relative: 0 % (ref 0–1)
Eosinophils Absolute: 0.1 10*3/uL (ref 0.0–0.7)
Eosinophils Relative: 2 % (ref 0–5)
HCT: 41.5 % (ref 36.0–46.0)
Hemoglobin: 14.4 g/dL (ref 12.0–15.0)
Lymphocytes Relative: 58 % — ABNORMAL HIGH (ref 12–46)
Lymphs Abs: 3.4 10*3/uL (ref 0.7–4.0)
MCH: 31.6 pg (ref 26.0–34.0)
MCHC: 34.7 g/dL (ref 30.0–36.0)
MCV: 91.2 fL (ref 78.0–100.0)
Monocytes Absolute: 0.3 10*3/uL (ref 0.1–1.0)
Monocytes Relative: 6 % (ref 3–12)
Neutro Abs: 2 10*3/uL (ref 1.7–7.7)
Neutrophils Relative %: 34 % — ABNORMAL LOW (ref 43–77)
Platelets: 354 10*3/uL (ref 150–400)
RBC: 4.55 MIL/uL (ref 3.87–5.11)
RDW: 11.5 % (ref 11.5–15.5)
WBC: 5.8 10*3/uL (ref 4.0–10.5)

## 2013-09-12 LAB — CBG MONITORING, ED: Glucose-Capillary: 208 mg/dL — ABNORMAL HIGH (ref 70–99)

## 2013-09-12 LAB — LIPASE, BLOOD: Lipase: 14 U/L (ref 11–59)

## 2013-09-12 MED ORDER — SODIUM CHLORIDE 0.9 % IV BOLUS (SEPSIS)
1000.0000 mL | Freq: Once | INTRAVENOUS | Status: AC
  Administered 2013-09-12: 1000 mL via INTRAVENOUS

## 2013-09-12 MED ORDER — HYDROMORPHONE HCL PF 1 MG/ML IJ SOLN
1.0000 mg | Freq: Once | INTRAMUSCULAR | Status: AC
  Administered 2013-09-12: 1 mg via INTRAVENOUS
  Filled 2013-09-12: qty 1

## 2013-09-12 MED ORDER — KETOROLAC TROMETHAMINE 30 MG/ML IJ SOLN
30.0000 mg | Freq: Once | INTRAMUSCULAR | Status: AC
Start: 2013-09-12 — End: 2013-09-12
  Administered 2013-09-12: 30 mg via INTRAVENOUS
  Filled 2013-09-12: qty 1

## 2013-09-12 NOTE — ED Provider Notes (Addendum)
CSN: [id number]     Arrival date & time 09/12/13  1945 History   First MD Initiated Contact with Patient 09/12/13 2034     Chief Complaint  Patient presents with  . Generalized Body Aches    "Sharp pains everywhere"  . Nausea     (Consider location/radiation/quality/duration/timing/severity/associated sxs/prior Treatment) HPI Comments: Patient is a 23 year old female with past medical history of type 1 diabetes. She presents with complaints of sharp pains in her chest, abdomen, back, and shoulders. She's had this in the past however no cause has been found. She's been told she may have fibromyalgia. Her pain is worse with movement and relieved with rest. She denies any fevers or chills. She denies any shortness of breath, bloody stool.  The history is provided by the patient.    Past Medical History  Diagnosis Date  . Insulin dependent type 1 diabetes mellitus    Past Surgical History  Procedure Laterality Date  . Mass excision Right 07/10/2013    Procedure: MINOR EXCISION RIGHT NECK CYST;  Surgeon: Drema Halonhristopher E Newman, MD;  Location: [facility];  Service: ENT;  Laterality: Right;   Family History  Problem Relation Age of Onset  . Diabetes Mother   . Migraines Mother   . Breast cancer Maternal Grandmother   . Thyroid disease Maternal Grandmother    History  Substance Use Topics  . Smoking status: Never Smoker   . Smokeless tobacco: Not on file  . Alcohol Use: No   OB History   Grav Para Term Preterm Abortions TAB SAB Ect Mult Living   1    1  1         Review of Systems  All other systems reviewed and are negative.     Allergies  Aloe and Olive oil  Home Medications   Prior to Admission medications   Medication Sig Start Date End Date Taking? Authorizing Provider  aspirin-sod bicarb-citric acid (ALKA-SELTZER) 325 MG TBEF tablet Take 325 mg by mouth every 6 (six) hours as needed (for nausea).   Yes Historical Provider, MD  bismuth subsalicylate  (PEPTO BISMOL) 262 MG/15ML suspension Take 30 mLs by mouth every 6 (six) hours as needed for indigestion.   Yes Historical Provider, MD  DULoxetine (CYMBALTA) 30 MG capsule Take 30 mg by mouth daily.   Yes Historical Provider, MD  fexofenadine (ALLEGRA) 180 MG tablet Take 180 mg by mouth daily.   Yes Historical Provider, MD  Ibuprofen-Diphenhydramine Cit (MOTRIN PM) 200-38 MG TABS Take 2 tablets by mouth at bedtime as needed (for pain and sleep).   Yes Historical Provider, MD  insulin aspart (NOVOLOG FLEXPEN) 100 UNIT/ML FlexPen Inject 5-20 Units into the skin 3 (three) times daily with meals.   Yes Historical Provider, MD  insulin detemir (LEVEMIR) 100 unit/ml SOLN Inject 15 Units into the skin at bedtime.   Yes Historical Provider, MD  oxyCODONE-acetaminophen (PERCOCET/ROXICET) 5-325 MG per tablet Take 2 tablets by mouth every 4 (four) hours as needed for severe pain.   Yes Historical Provider, MD   BP 120/82  Pulse 138  Temp(Src) 98.1 F (36.7 C) (Oral)  Resp 16  SpO2 98% Physical Exam  Nursing note and vitals reviewed. Constitutional: She is oriented to person, place, and time. She appears well-developed and well-nourished. No distress.  HENT:  Head: Normocephalic and atraumatic.  Neck: Normal range of motion. Neck supple.  Cardiovascular: Normal rate and regular rhythm.  Exam reveals no gallop and no friction rub.   No  murmur heard. Pulmonary/Chest: Effort normal and breath sounds normal. No respiratory distress. She has no wheezes.  Abdominal: Soft. Bowel sounds are normal. She exhibits no distension. There is no tenderness.  Musculoskeletal: Normal range of motion.  Neurological: She is alert and oriented to person, place, and time.  Skin: Skin is warm and dry. She is not diaphoretic.    ED Course  Procedures (including critical care time) Labs Review Labs Reviewed  CBC WITH DIFFERENTIAL - Abnormal; Notable for the following:    Neutrophils Relative % 34 (*)    Lymphocytes  Relative 58 (*)    All other components within normal limits  CBG MONITORING, ED - Abnormal; Notable for the following:    Glucose-Capillary 208 (*)    All other components within normal limits  COMPREHENSIVE METABOLIC PANEL  LIPASE, BLOOD    Imaging Review No results found.   EKG Interpretation   Date/Time:  Wednesday September 12 2013 20:25:35 EDT Ventricular Rate:  144 PR Interval:  124 QRS Duration: 66 QT Interval:  278 QTC Calculation: 430 R Axis:   -176 Text Interpretation:  Sinus tachycardia Right atrial enlargement Right  superior axis deviation Pulmonary disease pattern Abnormal ECG Confirmed  by DELOS  MD, Bijon Mineer ([id number]) on 09/12/2013 10:30:24 PM      MDM   Final diagnoses:  None    Patient is a 23 year old female with history of type 1 diabetes. She presents with complaints of pain in her torso that has been occurring consistently for many months. She's been evaluated by her primary doctor and endocrinologist for this however no cause has been found. She's been found to have a rapid heart rate during her doctor's visits, but no reason for this has been found. She presents here tonight complaining of worsening pain. She denies any fevers or chills. She states her sugars have been running well at home.  Workup today reveals normal blood counts and electrolytes which are essentially unremarkable. Her EKG reveals a sinus tachycardia. This improved somewhat with pain medication and fluid in the ER. I am uncertain as to the exact etiology of her discomfort and tachycardia however I found nothing this evening that appears emergent.  She was given pain medication and is feeling significantly improved. She is to followup with her primary Dr. to discuss further workup and referrals.   Geoffery Lyonsouglas Deyonna Fitzsimmons, MD 09/12/13 [id number]  Geoffery Lyonsouglas Ames Hoban, MD 09/25/13 [phone]

## 2013-09-12 NOTE — ED Notes (Signed)
Patient now states she feels extremely hot and dizzy.

## 2013-09-12 NOTE — Discharge Instructions (Signed)
Pain of Unknown Etiology (Pain Without a Known Cause) °You have come to your caregiver because of pain. Pain can occur in any part of the body. Often there is not a definite cause. If your laboratory (blood or urine) work was normal and X-rays or other studies were normal, your caregiver may treat you without knowing the cause of the pain. An example of this is the headache. Most headaches are diagnosed by taking a history. This means your caregiver asks you questions about your headaches. Your caregiver determines a treatment based on your answers. Usually testing done for headaches is normal. Often testing is not done unless there is no response to medications. Regardless of where your pain is located today, you can be given medications to make you comfortable. If no physical cause of pain can be found, most cases of pain will gradually leave as suddenly as they came.  °If you have a painful condition and no reason can be found for the pain, it is important that you follow up with your caregiver. If the pain becomes worse or does not go away, it may be necessary to repeat tests and look further for a possible cause. °· Only take over-the-counter or prescription medicines for pain, discomfort, or fever as directed by your caregiver. °· For the protection of your privacy, test results cannot be given over the phone. Make sure you receive the results of your test. Ask how these results are to be obtained if you have not been informed. It is your responsibility to obtain your test results. °· You may continue all activities unless the activities cause more pain. When the pain lessens, it is important to gradually resume normal activities. Resume activities by beginning slowly and gradually increasing the intensity and duration of the activities or exercise. During periods of severe pain, bed rest may be helpful. Lie or sit in any position that is comfortable. °· Ice used for acute (sudden) conditions may be effective.  Use a large plastic bag filled with ice and wrapped in a towel. This may provide pain relief. °· See your caregiver for continued problems. Your caregiver can help or refer you for exercises or physical therapy if necessary. °If you were given medications for your condition, do not drive, operate machinery or power tools, or sign legal documents for 24 hours. Do not drink alcohol, take sleeping pills, or take other medications that may interfere with treatment. °See your caregiver immediately if you have pain that is becoming worse and not relieved by medications. °Document Released: 11/17/2000 Document Revised: 12/13/2012 Document Reviewed: 02/22/2005 °[employer]® Patient Information ©2015 [employer]. This information is not intended to replace advice given to you by your health care provider. Make sure you discuss any questions you have with your health care provider. ° °

## 2013-09-12 NOTE — ED Notes (Signed)
Patient here with sharp pains in here abdomen, chest, back, and shoulders. History of chronic pains which she has been undergoing treatment for. States that she was told she has fibro myalgia. Has been tried on multiple meds with little effect. States that the pain she is currently experiencing started about 3 weeks ago after taking cymbalta; patient took the medication for only 4 days before stopping due to discomfort. Presents tonight because pain is worse than before.

## 2013-09-28 ENCOUNTER — Encounter (HOSPITAL_COMMUNITY): Payer: Self-pay | Admitting: Emergency Medicine

## 2013-09-28 DIAGNOSIS — E1010 Type 1 diabetes mellitus with ketoacidosis without coma: Principal | ICD-10-CM | POA: Diagnosis present

## 2013-09-28 DIAGNOSIS — E1049 Type 1 diabetes mellitus with other diabetic neurological complication: Secondary | ICD-10-CM | POA: Diagnosis present

## 2013-09-28 DIAGNOSIS — E1065 Type 1 diabetes mellitus with hyperglycemia: Secondary | ICD-10-CM | POA: Diagnosis present

## 2013-09-28 DIAGNOSIS — G894 Chronic pain syndrome: Secondary | ICD-10-CM | POA: Diagnosis present

## 2013-09-28 DIAGNOSIS — E860 Dehydration: Secondary | ICD-10-CM | POA: Diagnosis present

## 2013-09-28 DIAGNOSIS — Z794 Long term (current) use of insulin: Secondary | ICD-10-CM

## 2013-09-28 DIAGNOSIS — K219 Gastro-esophageal reflux disease without esophagitis: Secondary | ICD-10-CM | POA: Diagnosis present

## 2013-09-28 DIAGNOSIS — Z9119 Patient's noncompliance with other medical treatment and regimen: Secondary | ICD-10-CM

## 2013-09-28 DIAGNOSIS — Z91199 Patient's noncompliance with other medical treatment and regimen due to unspecified reason: Secondary | ICD-10-CM

## 2013-09-28 DIAGNOSIS — E1142 Type 2 diabetes mellitus with diabetic polyneuropathy: Secondary | ICD-10-CM | POA: Diagnosis present

## 2013-09-28 DIAGNOSIS — Z803 Family history of malignant neoplasm of breast: Secondary | ICD-10-CM

## 2013-09-28 DIAGNOSIS — Z833 Family history of diabetes mellitus: Secondary | ICD-10-CM

## 2013-09-28 DIAGNOSIS — Z79899 Other long term (current) drug therapy: Secondary | ICD-10-CM

## 2013-09-28 DIAGNOSIS — R000 Tachycardia, unspecified: Secondary | ICD-10-CM | POA: Diagnosis present

## 2013-09-28 NOTE — ED Notes (Signed)
Pt presents with nausea and vomiting x2 days, unable to keep food or fluid down, pt also c/o all over body pain. Pt states she was just diagnosed with a "cracked spine."

## 2013-09-29 ENCOUNTER — Encounter (HOSPITAL_COMMUNITY): Payer: Self-pay | Admitting: Internal Medicine

## 2013-09-29 ENCOUNTER — Inpatient Hospital Stay (HOSPITAL_COMMUNITY)
Admission: EM | Admit: 2013-09-29 | Discharge: 2013-10-01 | DRG: 639 | Disposition: A | Discharge status: Home / Self Care | Payer: BC Managed Care – PPO | Attending: Internal Medicine | Admitting: Internal Medicine

## 2013-09-29 DIAGNOSIS — M797 Fibromyalgia: Secondary | ICD-10-CM | POA: Diagnosis present

## 2013-09-29 DIAGNOSIS — E0810 Diabetes mellitus due to underlying condition with ketoacidosis without coma: Secondary | ICD-10-CM

## 2013-09-29 DIAGNOSIS — R1115 Cyclical vomiting syndrome unrelated to migraine: Secondary | ICD-10-CM

## 2013-09-29 DIAGNOSIS — E109 Type 1 diabetes mellitus without complications: Secondary | ICD-10-CM

## 2013-09-29 DIAGNOSIS — E1310 Other specified diabetes mellitus with ketoacidosis without coma: Secondary | ICD-10-CM

## 2013-09-29 DIAGNOSIS — E1010 Type 1 diabetes mellitus with ketoacidosis without coma: Secondary | ICD-10-CM

## 2013-09-29 DIAGNOSIS — B373 Candidiasis of vulva and vagina: Secondary | ICD-10-CM

## 2013-09-29 DIAGNOSIS — R1110 Vomiting, unspecified: Secondary | ICD-10-CM

## 2013-09-29 DIAGNOSIS — R112 Nausea with vomiting, unspecified: Secondary | ICD-10-CM | POA: Diagnosis present

## 2013-09-29 DIAGNOSIS — G8929 Other chronic pain: Secondary | ICD-10-CM

## 2013-09-29 DIAGNOSIS — E1110 Type 2 diabetes mellitus with ketoacidosis without coma: Secondary | ICD-10-CM | POA: Diagnosis present

## 2013-09-29 DIAGNOSIS — B3731 Acute candidiasis of vulva and vagina: Secondary | ICD-10-CM

## 2013-09-29 LAB — BASIC METABOLIC PANEL
Anion gap: 25 — ABNORMAL HIGH (ref 5–15)
Anion gap: 25 — ABNORMAL HIGH (ref 5–15)
Anion gap: 18 — ABNORMAL HIGH (ref 5–15)
Anion gap: 21 — ABNORMAL HIGH (ref 5–15)
Anion gap: 21 — ABNORMAL HIGH (ref 5–15)
Anion gap: 14 (ref 5–15)
Anion gap: 14 (ref 5–15)
Anion gap: 12 (ref 5–15)
BUN: 7 mg/dL (ref 6–23)
BUN: 7 mg/dL (ref 6–23)
BUN: 6 mg/dL (ref 6–23)
BUN: 6 mg/dL (ref 6–23)
BUN: 5 mg/dL — ABNORMAL LOW (ref 6–23)
BUN: 4 mg/dL — ABNORMAL LOW (ref 6–23)
BUN: 4 mg/dL — ABNORMAL LOW (ref 6–23)
BUN: 4 mg/dL — ABNORMAL LOW (ref 6–23)
CO2: 11 mEq/L — ABNORMAL LOW (ref 19–32)
CO2: 12 mEq/L — ABNORMAL LOW (ref 19–32)
CO2: 18 mEq/L — ABNORMAL LOW (ref 19–32)
CO2: 16 mEq/L — ABNORMAL LOW (ref 19–32)
CO2: 15 mEq/L — ABNORMAL LOW (ref 19–32)
CO2: 19 mEq/L (ref 19–32)
CO2: 19 mEq/L (ref 19–32)
CO2: 20 mEq/L (ref 19–32)
Calcium: 9.1 mg/dL (ref 8.4–10.5)
Calcium: 9.2 mg/dL (ref 8.4–10.5)
Calcium: 9.1 mg/dL (ref 8.4–10.5)
Calcium: 9.1 mg/dL (ref 8.4–10.5)
Calcium: 9.2 mg/dL (ref 8.4–10.5)
Calcium: 9.1 mg/dL (ref 8.4–10.5)
Calcium: 9 mg/dL (ref 8.4–10.5)
Calcium: 9 mg/dL (ref 8.4–10.5)
Chloride: 100 mEq/L (ref 96–112)
Chloride: 101 mEq/L (ref 96–112)
Chloride: 104 mEq/L (ref 96–112)
Chloride: 102 mEq/L (ref 96–112)
Chloride: 99 mEq/L (ref 96–112)
Chloride: 103 mEq/L (ref 96–112)
Chloride: 101 mEq/L (ref 96–112)
Chloride: 102 mEq/L (ref 96–112)
Creatinine, Ser: 0.42 mg/dL — ABNORMAL LOW (ref 0.50–1.10)
Creatinine, Ser: 0.42 mg/dL — ABNORMAL LOW (ref 0.50–1.10)
Creatinine, Ser: 0.38 mg/dL — ABNORMAL LOW (ref 0.50–1.10)
Creatinine, Ser: 0.37 mg/dL — ABNORMAL LOW (ref 0.50–1.10)
Creatinine, Ser: 0.35 mg/dL — ABNORMAL LOW (ref 0.50–1.10)
Creatinine, Ser: 0.37 mg/dL — ABNORMAL LOW (ref 0.50–1.10)
Creatinine, Ser: 0.43 mg/dL — ABNORMAL LOW (ref 0.50–1.10)
Creatinine, Ser: 0.46 mg/dL — ABNORMAL LOW (ref 0.50–1.10)
GFR calc Af Amer: >90 mL/min (ref >90)
GFR calc Af Amer: >90 mL/min (ref >90)
GFR calc Af Amer: >90 mL/min (ref >90)
GFR calc Af Amer: >90 mL/min (ref >90)
GFR calc Af Amer: >90 mL/min (ref >90)
GFR calc Af Amer: >90 mL/min (ref >90)
GFR calc Af Amer: >90 mL/min (ref >90)
GFR calc Af Amer: >90 mL/min (ref >90)
GFR calc non Af Amer: >90 mL/min (ref >90)
GFR calc non Af Amer: >90 mL/min (ref >90)
GFR calc non Af Amer: >90 mL/min (ref >90)
GFR calc non Af Amer: >90 mL/min (ref >90)
GFR calc non Af Amer: >90 mL/min (ref >90)
GFR calc non Af Amer: >90 mL/min (ref >90)
GFR calc non Af Amer: >90 mL/min (ref >90)
GFR calc non Af Amer: >90 mL/min (ref >90)
Glucose, Bld: 275 mg/dL — ABNORMAL HIGH (ref 70–99)
Glucose, Bld: 278 mg/dL — ABNORMAL HIGH (ref 70–99)
Glucose, Bld: 122 mg/dL — ABNORMAL HIGH (ref 70–99)
Glucose, Bld: 113 mg/dL — ABNORMAL HIGH (ref 70–99)
Glucose, Bld: 250 mg/dL — ABNORMAL HIGH (ref 70–99)
Glucose, Bld: 152 mg/dL — ABNORMAL HIGH (ref 70–99)
Glucose, Bld: 301 mg/dL — ABNORMAL HIGH (ref 70–99)
Glucose, Bld: 257 mg/dL — ABNORMAL HIGH (ref 70–99)
Potassium: 3.8 mEq/L (ref 3.7–5.3)
Potassium: 3.8 mEq/L (ref 3.7–5.3)
Potassium: 3.3 mEq/L — ABNORMAL LOW (ref 3.7–5.3)
Potassium: 3.4 mEq/L — ABNORMAL LOW (ref 3.7–5.3)
Potassium: 4.6 mEq/L (ref 3.7–5.3)
Potassium: 3.8 mEq/L (ref 3.7–5.3)
Potassium: 4.3 mEq/L (ref 3.7–5.3)
Potassium: 4.6 mEq/L (ref 3.7–5.3)
Sodium: 137 mEq/L (ref 137–147)
Sodium: 137 mEq/L (ref 137–147)
Sodium: 140 mEq/L (ref 137–147)
Sodium: 139 mEq/L (ref 137–147)
Sodium: 135 mEq/L — ABNORMAL LOW (ref 137–147)
Sodium: 136 mEq/L — ABNORMAL LOW (ref 137–147)
Sodium: 134 mEq/L — ABNORMAL LOW (ref 137–147)
Sodium: 134 mEq/L — ABNORMAL LOW (ref 137–147)

## 2013-09-29 LAB — URINALYSIS, ROUTINE W REFLEX MICROSCOPIC
Bilirubin Urine: NEGATIVE
Glucose, UA: >1000 mg/dL — AB
Ketones, ur: >80 mg/dL — AB
Leukocytes, UA: NEGATIVE
Nitrite: NEGATIVE
Protein, ur: NEGATIVE mg/dL
Specific Gravity, Urine: 1.021 (ref 1.005–1.030)
Urobilinogen, UA: 0.2 mg/dL (ref 0.0–1.0)
pH: 5.5 (ref 5.0–8.0)

## 2013-09-29 LAB — COMPREHENSIVE METABOLIC PANEL
ALT: 7 U/L (ref 0–35)
AST: 11 U/L (ref 0–37)
Albumin: 4.4 g/dL (ref 3.5–5.2)
Alkaline Phosphatase: 66 U/L (ref 39–117)
Anion gap: 30 — ABNORMAL HIGH (ref 5–15)
BUN: 8 mg/dL (ref 6–23)
CO2: 12 mEq/L — ABNORMAL LOW (ref 19–32)
Calcium: 9.9 mg/dL (ref 8.4–10.5)
Chloride: 93 mEq/L — ABNORMAL LOW (ref 96–112)
Creatinine, Ser: 0.46 mg/dL — ABNORMAL LOW (ref 0.50–1.10)
GFR calc Af Amer: >90 mL/min (ref >90)
GFR calc non Af Amer: >90 mL/min (ref >90)
Glucose, Bld: 416 mg/dL — ABNORMAL HIGH (ref 70–99)
Potassium: 4.1 mEq/L (ref 3.7–5.3)
Sodium: 135 mEq/L — ABNORMAL LOW (ref 137–147)
Total Bilirubin: 0.5 mg/dL (ref 0.3–1.2)
Total Protein: 8.1 g/dL (ref 6.0–8.3)

## 2013-09-29 LAB — CBC WITH DIFFERENTIAL/PLATELET
Basophils Absolute: 0 10*3/uL (ref 0.0–0.1)
Basophils Relative: 0 % (ref 0–1)
Eosinophils Absolute: 0 10*3/uL (ref 0.0–0.7)
Eosinophils Relative: 0 % (ref 0–5)
HCT: 40.5 % (ref 36.0–46.0)
Hemoglobin: 13.9 g/dL (ref 12.0–15.0)
Lymphocytes Relative: 10 % — ABNORMAL LOW (ref 12–46)
Lymphs Abs: 0.7 10*3/uL (ref 0.7–4.0)
MCH: 31.6 pg (ref 26.0–34.0)
MCHC: 34.3 g/dL (ref 30.0–36.0)
MCV: 92 fL (ref 78.0–100.0)
Monocytes Absolute: 0.1 10*3/uL (ref 0.1–1.0)
Monocytes Relative: 2 % — ABNORMAL LOW (ref 3–12)
Neutro Abs: 6.4 10*3/uL (ref 1.7–7.7)
Neutrophils Relative %: 88 % — ABNORMAL HIGH (ref 43–77)
Platelets: 309 10*3/uL (ref 150–400)
RBC: 4.4 MIL/uL (ref 3.87–5.11)
RDW: 11.9 % (ref 11.5–15.5)
WBC: 7.2 10*3/uL (ref 4.0–10.5)

## 2013-09-29 LAB — GLUCOSE, CAPILLARY
Glucose-Capillary: 248 mg/dL — ABNORMAL HIGH (ref 70–99)
Glucose-Capillary: 260 mg/dL — ABNORMAL HIGH (ref 70–99)
Glucose-Capillary: 270 mg/dL — ABNORMAL HIGH (ref 70–99)
Glucose-Capillary: 168 mg/dL — ABNORMAL HIGH (ref 70–99)
Glucose-Capillary: 86 mg/dL (ref 70–99)
Glucose-Capillary: 113 mg/dL — ABNORMAL HIGH (ref 70–99)
Glucose-Capillary: 151 mg/dL — ABNORMAL HIGH (ref 70–99)
Glucose-Capillary: 227 mg/dL — ABNORMAL HIGH (ref 70–99)
Glucose-Capillary: 234 mg/dL — ABNORMAL HIGH (ref 70–99)
Glucose-Capillary: 221 mg/dL — ABNORMAL HIGH (ref 70–99)
Glucose-Capillary: 185 mg/dL — ABNORMAL HIGH (ref 70–99)
Glucose-Capillary: 168 mg/dL — ABNORMAL HIGH (ref 70–99)
Glucose-Capillary: 131 mg/dL — ABNORMAL HIGH (ref 70–99)
Glucose-Capillary: 77 mg/dL (ref 70–99)

## 2013-09-29 LAB — CBC
HCT: 37.5 % (ref 36.0–46.0)
Hemoglobin: 12.8 g/dL (ref 12.0–15.0)
MCH: 31.8 pg (ref 26.0–34.0)
MCHC: 34.1 g/dL (ref 30.0–36.0)
MCV: 93.1 fL (ref 78.0–100.0)
Platelets: 286 10*3/uL (ref 150–400)
RBC: 4.03 MIL/uL (ref 3.87–5.11)
RDW: 12 % (ref 11.5–15.5)
WBC: 6 10*3/uL (ref 4.0–10.5)

## 2013-09-29 LAB — I-STAT VENOUS BLOOD GAS, ED
Acid-base deficit: 8 mmol/L — ABNORMAL HIGH (ref 0.0–2.0)
Bicarbonate: 17.5 mEq/L — ABNORMAL LOW (ref 20.0–24.0)
O2 Saturation: 23 %
TCO2: 19 mmol/L (ref 0–100)
pCO2, Ven: 36.6 mmHg — ABNORMAL LOW (ref 45.0–50.0)
pH, Ven: 7.288 (ref 7.250–7.300)
pO2, Ven: 18 mmHg — CL (ref 30.0–45.0)

## 2013-09-29 LAB — POC URINE PREG, ED: Preg Test, Ur: NEGATIVE

## 2013-09-29 LAB — URINE MICROSCOPIC-ADD ON

## 2013-09-29 LAB — TROPONIN I: Troponin I: <0.3 ng/mL (ref <0.30)

## 2013-09-29 LAB — LIPASE, BLOOD: Lipase: 9 U/L — ABNORMAL LOW (ref 11–59)

## 2013-09-29 MED ORDER — IBUPROFEN 400 MG PO TABS
400.0000 mg | ORAL_TABLET | Freq: Every evening | ORAL | Status: DC | PRN
Start: 2013-09-29 — End: 2013-10-01
  Administered 2013-09-29: 400 mg via ORAL
  Filled 2013-09-29 (×3): qty 1

## 2013-09-29 MED ORDER — SODIUM CHLORIDE 0.9 % IV SOLN
1000.0000 mL | Freq: Once | INTRAVENOUS | Status: AC
  Administered 2013-09-29: 1000 mL via INTRAVENOUS

## 2013-09-29 MED ORDER — TIZANIDINE HCL 2 MG PO TABS
2.0000 mg | ORAL_TABLET | Freq: Three times a day (TID) | ORAL | Status: DC | PRN
  Administered 2013-09-30: 2 mg via ORAL
  Filled 2013-09-29 (×3): qty 1

## 2013-09-29 MED ORDER — GABAPENTIN 100 MG PO CAPS
100.0000 mg | ORAL_CAPSULE | Freq: Every evening | ORAL | Status: DC
  Administered 2013-09-29 – 2013-09-30 (×2): 100 mg via ORAL
  Filled 2013-09-29 (×3): qty 1

## 2013-09-29 MED ORDER — DEXTROSE 5 % IV SOLN
INTRAVENOUS | Status: DC
  Administered 2013-09-29 – 2013-09-30 (×3): via INTRAVENOUS

## 2013-09-29 MED ORDER — DEXTROSE-NACL 5-0.45 % IV SOLN
INTRAVENOUS | Status: DC
  Administered 2013-09-29: 05:00:00 via INTRAVENOUS

## 2013-09-29 MED ORDER — POTASSIUM CHLORIDE 10 MEQ/100ML IV SOLN
10.0000 meq | INTRAVENOUS | Status: AC
  Administered 2013-09-29 (×2): 10 meq via INTRAVENOUS
  Filled 2013-09-29 (×2): qty 100

## 2013-09-29 MED ORDER — INSULIN GLARGINE 100 UNIT/ML ~~LOC~~ SOLN
5.0000 [IU] | Freq: Every day | SUBCUTANEOUS | Status: DC
  Administered 2013-09-29: 5 [IU] via SUBCUTANEOUS
  Filled 2013-09-29: qty 0.05

## 2013-09-29 MED ORDER — TRAMADOL HCL 50 MG PO TABS
50.0000 mg | ORAL_TABLET | Freq: Four times a day (QID) | ORAL | Status: DC
  Administered 2013-09-29 – 2013-09-30 (×7): 50 mg via ORAL
  Filled 2013-09-29 (×7): qty 1

## 2013-09-29 MED ORDER — DEXTROSE 50 % IV SOLN: 25.0000 mL | INTRAVENOUS | Status: DC | PRN

## 2013-09-29 MED ORDER — GABAPENTIN 100 MG PO CAPS
300.0000 mg | ORAL_CAPSULE | Freq: Every day | ORAL | Status: DC
  Administered 2013-09-29 – 2013-09-30 (×2): 300 mg via ORAL
  Filled 2013-09-29 (×3): qty 3

## 2013-09-29 MED ORDER — IBUPROFEN-DIPHENHYDRAMINE CIT 200-38 MG PO TABS: 2.0000 | ORAL_TABLET | Freq: Every evening | ORAL | Status: DC | PRN

## 2013-09-29 MED ORDER — SODIUM CHLORIDE 0.9 % IV SOLN: 1000.0000 mL | INTRAVENOUS | Status: DC

## 2013-09-29 MED ORDER — DIPHENHYDRAMINE HCL 25 MG PO CAPS
50.0000 mg | ORAL_CAPSULE | Freq: Every evening | ORAL | Status: DC | PRN
  Filled 2013-09-29: qty 2

## 2013-09-29 MED ORDER — POTASSIUM CHLORIDE 20 MEQ/15ML (10%) PO LIQD
40.0000 meq | Freq: Three times a day (TID) | ORAL | Status: DC
  Administered 2013-09-29 – 2013-09-30 (×6): 40 meq via ORAL
  Filled 2013-09-29 (×9): qty 30

## 2013-09-29 MED ORDER — SODIUM CHLORIDE 0.9 % IV SOLN
INTRAVENOUS | Status: DC
  Filled 2013-09-29: qty 1

## 2013-09-29 MED ORDER — PANTOPRAZOLE SODIUM 40 MG IV SOLR
40.0000 mg | Freq: Every day | INTRAVENOUS | Status: DC
  Administered 2013-09-29: 40 mg via INTRAVENOUS
  Filled 2013-09-29: qty 40

## 2013-09-29 MED ORDER — SODIUM CHLORIDE 0.9 % IV SOLN: 1000.0000 mL | Freq: Once | INTRAVENOUS | Status: DC

## 2013-09-29 MED ORDER — SODIUM CHLORIDE 0.9 % IV SOLN: INTRAVENOUS | Status: AC

## 2013-09-29 MED ORDER — ONDANSETRON HCL 4 MG/2ML IJ SOLN
4.0000 mg | Freq: Once | INTRAMUSCULAR | Status: AC
  Administered 2013-09-29: 4 mg via INTRAVENOUS
  Filled 2013-09-29: qty 2

## 2013-09-29 MED ORDER — PANTOPRAZOLE SODIUM 40 MG PO TBEC
40.0000 mg | DELAYED_RELEASE_TABLET | Freq: Two times a day (BID) | ORAL | Status: DC
  Administered 2013-09-29 – 2013-09-30 (×3): 40 mg via ORAL
  Filled 2013-09-29 (×3): qty 1

## 2013-09-29 MED ORDER — MORPHINE SULFATE 2 MG/ML IJ SOLN
1.0000 mg | INTRAMUSCULAR | Status: DC | PRN
  Administered 2013-09-29 – 2013-10-01 (×7): 2 mg via INTRAVENOUS
  Filled 2013-09-29 (×7): qty 1

## 2013-09-29 MED ORDER — SODIUM CHLORIDE 0.9 % IV SOLN
INTRAVENOUS | Status: DC
  Administered 2013-09-29: 1.9 [IU]/h via INTRAVENOUS
  Administered 2013-09-29: 3.4 [IU]/h via INTRAVENOUS
  Administered 2013-09-29: 4 [IU]/h via INTRAVENOUS
  Filled 2013-09-29 (×2): qty 1

## 2013-09-29 MED ORDER — ONDANSETRON HCL 4 MG/2ML IJ SOLN
4.0000 mg | Freq: Four times a day (QID) | INTRAMUSCULAR | Status: DC | PRN
  Administered 2013-09-29 (×2): 4 mg via INTRAVENOUS
  Filled 2013-09-29 (×2): qty 2

## 2013-09-29 MED ORDER — HEPARIN SODIUM (PORCINE) 5000 UNIT/ML IJ SOLN
5000.0000 [IU] | Freq: Three times a day (TID) | INTRAMUSCULAR | Status: DC
  Administered 2013-09-29 – 2013-10-01 (×6): 5000 [IU] via SUBCUTANEOUS
  Filled 2013-09-29 (×10): qty 1

## 2013-09-29 MED ORDER — DEXTROSE-NACL 5-0.45 % IV SOLN: INTRAVENOUS | Status: DC

## 2013-09-29 MED ORDER — SODIUM CHLORIDE 0.9 % IV SOLN: INTRAVENOUS | Status: DC

## 2013-09-29 MED ORDER — MORPHINE SULFATE 4 MG/ML IJ SOLN
4.0000 mg | Freq: Once | INTRAMUSCULAR | Status: AC
  Administered 2013-09-29: 4 mg via INTRAVENOUS
  Filled 2013-09-29: qty 1

## 2013-09-29 NOTE — ED Provider Notes (Signed)
CSN: [id number]     Arrival date & time 09/28/13  2327 History   First MD Initiated Contact with Patient 09/29/13 0106     Chief Complaint  Patient presents with  . Emesis     (Consider location/radiation/quality/duration/timing/severity/associated sxs/prior Treatment) HPI  This is a 23 year old female with history of type 1 diabetes and chronic pain who presents with emesis.  Patient reports that she's been seen by pain management Dr. and has been changes in her pain medications.  She reports for the last 2 days she's had progressive nausea and vomiting and has not been able to keep her pills down. She thinks the nausea and vomiting may be related to the increase in her pain medications. She denies any abdominal pain or diarrhea. Her last 2 days her blood sugars have been "high" at home she reports compliance with insulin. Last menstrual period is current She is currently reporting 7/10 back pain which is not new for her.  Patient denies any recent illnesses, fevers, chest pain, shortness of breath, dysuria.   Past Medical History  Diagnosis Date  . Insulin dependent type 1 diabetes mellitus    Past Surgical History  Procedure Laterality Date  . Mass excision Right 07/10/2013    Procedure: MINOR EXCISION RIGHT NECK CYST;  Surgeon: Drema Halonhristopher E Newman, MD;  Location: [facility];  Service: ENT;  Laterality: Right;   Family History  Problem Relation Age of Onset  . Diabetes Mother   . Migraines Mother   . Breast cancer Maternal Grandmother   . Thyroid disease Maternal Grandmother    History  Substance Use Topics  . Smoking status: Never Smoker   . Smokeless tobacco: Not on file  . Alcohol Use: No   OB History   Grav Para Term Preterm Abortions TAB SAB Ect Mult Living   1    1  1         Review of Systems  Constitutional: Negative for fever.  Respiratory: Negative for cough, chest tightness and shortness of breath.   Cardiovascular: Negative for chest pain.   Gastrointestinal: Positive for nausea and vomiting. Negative for abdominal pain and diarrhea.  Genitourinary: Negative for dysuria.  Musculoskeletal: Positive for back pain.  Skin: Negative for rash.  Neurological: Negative for headaches.  All other systems reviewed and are negative.     Allergies  Aloe and Olive oil  Home Medications   Prior to Admission medications   Medication Sig Start Date End Date Taking? Authorizing Provider  aspirin-sod bicarb-citric acid (ALKA-SELTZER) 325 MG TBEF tablet Take 325 mg by mouth every 6 (six) hours as needed (for nausea).   Yes Historical Provider, MD  bismuth subsalicylate (PEPTO BISMOL) 262 MG/15ML suspension Take 30 mLs by mouth every 6 (six) hours as needed for indigestion.   Yes Historical Provider, MD  gabapentin (NEURONTIN) 100 MG capsule Take 100-300 mg by mouth 2 (two) times daily. 300mg  in the morning and 100mg  in the evening   Yes Historical Provider, MD  Ibuprofen-Diphenhydramine Cit (MOTRIN PM) 200-38 MG TABS Take 2 tablets by mouth at bedtime as needed (for pain and sleep).   Yes Historical Provider, MD  insulin aspart (NOVOLOG FLEXPEN) 100 UNIT/ML FlexPen Inject 5-20 Units into the skin 3 (three) times daily with meals.   Yes Historical Provider, MD  insulin detemir (LEVEMIR) 100 unit/ml SOLN Inject 15 Units into the skin at bedtime.   Yes Historical Provider, MD  tiZANidine (ZANAFLEX) 4 MG tablet Take 4 mg by mouth every  8 (eight) hours as needed for muscle spasms.   Yes Historical Provider, MD  traMADol (ULTRAM) 50 MG tablet Take 100 mg by mouth 4 (four) times daily.   Yes Historical Provider, MD   BP 95/63  Pulse 129  Temp(Src) 98.4 F (36.9 C) (Oral)  Resp 18  Ht 5\' 3"  (1.6 m)  Wt 103 lb 3.2 oz (46.811 kg)  BMI 18.29 kg/m2  SpO2 99%  LMP 09/25/2013 Physical Exam  Nursing note and vitals reviewed. Constitutional: She is oriented to person, place, and time. No distress.  Chronically ill-appearing, thin  HENT:  Head:  Normocephalic and atraumatic.  Mucous membranes dry  Cardiovascular: Normal rate, regular rhythm and normal heart sounds.   No murmur heard. Pulmonary/Chest: Effort normal and breath sounds normal. No respiratory distress. She has no wheezes.  Abdominal: Soft. Bowel sounds are normal. There is no tenderness. There is no rebound and no guarding.  Musculoskeletal: She exhibits no edema.  Neurological: She is alert and oriented to person, place, and time.  Skin: Skin is warm and dry.  Psychiatric: She has a normal mood and affect.    ED Course  Procedures (including critical care time)  CRITICAL CARE Performed by: Ross Marcus, F   Total critical care time: 35 min Critical care time was exclusive of separately billable procedures and treating other patients.  Critical care was necessary to treat or prevent imminent or life-threatening deterioration.  Critical care was time spent personally by me on the following activities: development of treatment plan with patient and/or surrogate as well as nursing, discussions with consultants, evaluation of patient's response to treatment, examination of patient, obtaining history from patient or surrogate, ordering and performing treatments and interventions, ordering and review of laboratory studies, ordering and review of radiographic studies, pulse oximetry and re-evaluation of patient's condition.  Labs Review Labs Reviewed  COMPREHENSIVE METABOLIC PANEL - Abnormal; Notable for the following:    Sodium 135 (*)    Chloride 93 (*)    CO2 12 (*)    Glucose, Bld 416 (*)    Creatinine, Ser 0.46 (*)    Anion gap 30 (*)    All other components within normal limits  CBC WITH DIFFERENTIAL - Abnormal; Notable for the following:    Neutrophils Relative % 88 (*)    Lymphocytes Relative 10 (*)    Monocytes Relative 2 (*)    All other components within normal limits  LIPASE, BLOOD - Abnormal; Notable for the following:    Lipase 9 (*)    All  other components within normal limits  URINALYSIS, ROUTINE W REFLEX MICROSCOPIC - Abnormal; Notable for the following:    Glucose, UA >1000 (*)    Hgb urine dipstick LARGE (*)    Ketones, ur >80 (*)    All other components within normal limits  BASIC METABOLIC PANEL - Abnormal; Notable for the following:    CO2 11 (*)    Glucose, Bld 275 (*)    Creatinine, Ser 0.42 (*)    Anion gap 25 (*)    All other components within normal limits  BASIC METABOLIC PANEL - Abnormal; Notable for the following:    CO2 12 (*)    Glucose, Bld 278 (*)    Creatinine, Ser 0.42 (*)    Anion gap 25 (*)    All other components within normal limits  BASIC METABOLIC PANEL - Abnormal; Notable for the following:    Potassium 3.3 (*)    CO2 18 (*)  Glucose, Bld 122 (*)    Creatinine, Ser 0.38 (*)    Anion gap 18 (*)    All other components within normal limits  GLUCOSE, CAPILLARY - Abnormal; Notable for the following:    Glucose-Capillary 270 (*)    All other components within normal limits  GLUCOSE, CAPILLARY - Abnormal; Notable for the following:    Glucose-Capillary 168 (*)    All other components within normal limits  GLUCOSE, CAPILLARY - Abnormal; Notable for the following:    Glucose-Capillary 248 (*)    All other components within normal limits  GLUCOSE, CAPILLARY - Abnormal; Notable for the following:    Glucose-Capillary 260 (*)    All other components within normal limits  I-STAT VENOUS BLOOD GAS, ED - Abnormal; Notable for the following:    pCO2, Ven 36.6 (*)    pO2, Ven 18.0 (*)    Bicarbonate 17.5 (*)    Acid-base deficit 8.0 (*)    All other components within normal limits  URINE MICROSCOPIC-ADD ON  CBC  TROPONIN I  GLUCOSE, CAPILLARY  BASIC METABOLIC PANEL  TROPONIN I  POC URINE PREG, ED    Imaging Review No results found.   EKG Interpretation None      MDM   Final diagnoses:  Diabetic ketoacidosis without coma associated with diabetes mellitus due to underlying  condition   Patient presents with vomiting. Type I diabetic. Glucoses in the 400s. Patient was initiated on 2 L of normal saline and given pain medication.  Initial BMP with an anion gap of 30 and bicarbonate of 12.   Positive ketones in the urine. Would be concerned for acute DKA. Patient was initiated on the glucose stabilizer. No obvious source of infection.   Discussed with admitting hospitalist. We'll admit for DKA.    Shon Baton, MD 09/29/13 2248

## 2013-09-29 NOTE — H&P (Signed)
[facility] History and Physical  Theresa GaudyCourtney R Costen WUJ:[id number] DOB: 11/30/1990 DOA: 09/29/2013  Referring physician: Dr. Wilkie AyeHorton PCP: Leanor RubensteinSUN,VYVYAN Y, MD   Chief Complaint: Nausea, vomiting, inability to keep anything down and DKA.  HPI: Theresa [name] is a 23 y.o. female with a past medical history significant for insulin-dependent type 1 diabetes, chronic pain syndrome (presumed fibromyalgia), GERD and neuropathy; presents to the emergency department complaining of nausea, vomiting and inability to keep anything down. Patient reports that secondary to this difficulty with fluid intake she has not been able to be compliant with her insulin. Patient symptoms is that approximately 48-72 hours prior to admission and the patient associates the discomfort and problems with the recent adjustments to her tramadol dosage to control chronic pain. Patient denies chest pain, cough, shortness of breath, dysuria, melena, hematemesis, hematochezia, headaches, blurred vision or any other acute complaints. In the ED patient was found to be in DKA with anion gap of 30 and bicarbonate of 12. On physical exam she is dehydrated. [facility] has been called to me the patient for further evaluation and treatment.     Review of Systems:  Negative except as otherwise mentioned on history of present illness.  Past Medical History  Diagnosis Date  . Insulin dependent type 1 diabetes mellitus    Past Surgical History  Procedure Laterality Date  . Mass excision Right 07/10/2013    Procedure: MINOR EXCISION RIGHT NECK CYST;  Surgeon: Drema Halonhristopher E Newman, MD;  Location: [facility];  Service: ENT;  Laterality: Right;   Social History:  reports that she has never smoked. She does not have any smokeless tobacco history on file. She reports that she does not drink alcohol or use illicit drugs.  Allergies  Allergen Reactions  . Aloe Hives  . Olive Oil Hives    Family History  Problem  Relation Age of Onset  . Diabetes Mother   . Migraines Mother   . Breast cancer Maternal Grandmother   . Thyroid disease Maternal Grandmother      Prior to Admission medications   Medication Sig Start Date End Date Taking? Authorizing Provider  aspirin-sod bicarb-citric acid (ALKA-SELTZER) 325 MG TBEF tablet Take 325 mg by mouth every 6 (six) hours as needed (for nausea).   Yes Historical Provider, MD  bismuth subsalicylate (PEPTO BISMOL) 262 MG/15ML suspension Take 30 mLs by mouth every 6 (six) hours as needed for indigestion.   Yes Historical Provider, MD  gabapentin (NEURONTIN) 100 MG capsule Take 100-300 mg by mouth 2 (two) times daily. 300mg  in the morning and 100mg  in the evening   Yes Historical Provider, MD  Ibuprofen-Diphenhydramine Cit (MOTRIN PM) 200-38 MG TABS Take 2 tablets by mouth at bedtime as needed (for pain and sleep).   Yes Historical Provider, MD  insulin aspart (NOVOLOG FLEXPEN) 100 UNIT/ML FlexPen Inject 5-20 Units into the skin 3 (three) times daily with meals.   Yes Historical Provider, MD  insulin detemir (LEVEMIR) 100 unit/ml SOLN Inject 15 Units into the skin at bedtime.   Yes Historical Provider, MD  tiZANidine (ZANAFLEX) 4 MG tablet Take 4 mg by mouth every 8 (eight) hours as needed for muscle spasms.   Yes Historical Provider, MD  traMADol (ULTRAM) 50 MG tablet Take 100 mg by mouth 4 (four) times daily.   Yes Historical Provider, MD   Physical Exam: Filed Vitals:   09/29/13 0300 09/29/13 0304 09/29/13 0308 09/29/13 0357  BP: 118/73   107/71  Pulse: 130 125  135 134  Temp:    99.7 F (37.6 C)  TempSrc:    Oral  Resp:    21  Weight:    46.811 kg (103 lb 3.2 oz)  SpO2: 100% 100% 100% 100%    Wt Readings from Last 3 Encounters:  09/29/13 46.811 kg (103 lb 3.2 oz)  07/25/13 51.71 kg (114 lb)  07/19/13 52.436 kg (115 lb 9.6 oz)    General:  Appears calm, afebrile and in no distress. Patient reports feeling and weak and tired. denies CP, SOB, dysuria or any  other active complaints. Eyes: PERRL, normal lids, irises & conjunctiva, no icterus, no nystagmus ENT: grossly normal hearing, dry mucous membranes, no erythema or exudates inside her mouth; no drainage out of ears or nostrils Neck: no LAD, masses or thyromegaly no JVD,  Cardiovascular: tachycardic, no murmurs, no gallops, no rubs  Respiratory: CTA bilaterally, no w/r/r. Normal respiratory effort. Abdomen: soft, nontender, nondistended; also do bowel sounds; it is no guarding or rebound.  Skin: no rash, petechiae  or induration seen on exam Musculoskeletal: grossly normal tone BUE/BLE; no lower extremity edema, cyanosis or clubbing  Psychiatric: grossly normal mood and affect, speech fluent and appropriate Neurologic: grossly non-focal.          Labs on Admission:  Basic Metabolic Panel:  Recent Labs Lab 09/29/13 0010  NA 135*  K 4.1  CL 93*  CO2 12*  GLUCOSE 416*  BUN 8  CREATININE 0.46*  CALCIUM 9.9   Liver Function Tests:  Recent Labs Lab 09/29/13 0010  AST 11  ALT 7  ALKPHOS 66  BILITOT 0.5  PROT 8.1  ALBUMIN 4.4    Recent Labs Lab 09/29/13 0010  LIPASE 9*   CBC:  Recent Labs Lab 09/29/13 0010  WBC 7.2  NEUTROABS 6.4  HGB 13.9  HCT 40.5  MCV 92.0  PLT 309   Cardiac Enzymes: No results found for this basename: CKTOTAL, CKMB, CKMBINDEX, TROPONINI,  in the last 168 hours  BNP (last 3 results) No results found for this basename: PROBNP,  in the last 8760 hours CBG: No results found for this basename: GLUCAP,  in the last 168 hours  Radiological Exams on Admission: No results found.  EKG:  None   Assessment/Plan 1-DKA (diabetic ketoacidoses) type I: Appears to be secondary to medication noncompliance during episode of acute nausea/vomiting and inability to keep things down following use of increased dose of tramadol for chronic pain. -Will admit to MedSurg bed -Fluid resuscitation -IV insulin with DKA protocol -electrolytes repletion as  needed -NPO status and PRN antiemetics -no fever or signs of infection; lipase negative -will check troponin -Gap 30 and bicarb 12  2-tachycardia: secondary to dehydration and DKA. -will provide aggressive fluid resuscitation -follow HR -patient w/o CP or hx of heart problems  3-Chronic pain: Will use IV morphine for pain control while unable to take by mouth. -Patient is actively following at the pain clinic and will require further medication adjustments at discharge  4-Nausea with vomiting: Appears to be precipitated by the use of tramadol; with subsequent component from acidosis due to DKA status. -Will use as needed antiemetics -Fluid resuscitation and supportive care -Will correct DKA status  5-GERD: will use protonix  6-neuropathy: Continue Neurontin   Code Status: Full DVT Prophylaxis:heparin Family Communication: aunt and grandmother Disposition Plan: inpatient, LOS > 2 midnights, med-surg  Time spent: 50 minutes  Vassie Loll [facility] Pager [phone]  **Disclaimer: This note may have been dictated with  voice recognition software. Similar sounding words can inadvertently be transcribed and this note may contain transcription errors which may not have been corrected upon publication of note.**

## 2013-09-29 NOTE — Progress Notes (Signed)
[facility] PROGRESS NOTE  Assessment/Plan: Nausea with vomiting due to DKA (diabetic ketoacidoses) - On insulin gtt, HCO < 12. Start Lantus cont IV insulin until HCO > 20. - Afebrile, no leukocytosis.  - no source of infection or ischemia or change in meds or new meds.  Code Status: full Family Communication: none  Disposition Plan: inpatient   Consultants:  none  Procedures:  none  Antibiotics:  None  HPI/Subjective: nausea resovled.  Objective: Filed Vitals:   09/29/13 0300 09/29/13 0304 09/29/13 0308 09/29/13 0357  BP: 118/73   107/71  Pulse: 130 125 135 134  Temp:    99.7 F (37.6 C)  TempSrc:    Oral  Resp:    21  Height:    5\' 3"  (1.6 m)  Weight:    46.811 kg (103 lb 3.2 oz)  SpO2: 100% 100% 100% 100%    Intake/Output Summary (Last 24 hours) at 09/29/13 0858 Last data filed at 09/29/13 0644  Gross per 24 hour  Intake 314.23 ml  Output      0 ml  Net 314.23 ml   Filed Weights   09/29/13 0357  Weight: 46.811 kg (103 lb 3.2 oz)    Exam:  General: Alert, awake, oriented x3, in no acute distress.  HEENT: No bruits, no goiter.  Heart: Regular rate and rhythm, without murmurs, rubs, gallops.  Lungs: Good air movement, bilateral air movement.  Abdomen: Soft, nontender, nondistended, positive bowel sounds.  Neuro: Grossly intact, nonfocal.   Data Reviewed: Basic Metabolic Panel:  Recent Labs Lab 09/29/13 0010 09/29/13 0443 09/29/13 0451  NA 135* 137 137  K 4.1 3.8 3.8  CL 93* 101 100  CO2 12* 11* 12*  GLUCOSE 416* 275* 278*  BUN 8 7 7   CREATININE 0.46* 0.42* 0.42*  CALCIUM 9.9 9.1 9.2   Liver Function Tests:  Recent Labs Lab 09/29/13 0010  AST 11  ALT 7  ALKPHOS 66  BILITOT 0.5  PROT 8.1  ALBUMIN 4.4    Recent Labs Lab 09/29/13 0010  LIPASE 9*   No results found for this basename: AMMONIA,  in the last 168 hours CBC:  Recent Labs Lab 09/29/13 0010 09/29/13 0443  WBC 7.2 6.0  NEUTROABS 6.4  --   HGB  13.9 12.8  HCT 40.5 37.5  MCV 92.0 93.1  PLT 309 286   Cardiac Enzymes: No results found for this basename: CKTOTAL, CKMB, CKMBINDEX, TROPONINI,  in the last 168 hours BNP (last 3 results) No results found for this basename: PROBNP,  in the last 8760 hours CBG:  Recent Labs Lab 09/29/13 0415 09/29/13 0542 09/29/13 0650 09/29/13 0754  GLUCAP 248* 260* 270* 168*    No results found for this or any previous visit (from the past 240 hour(s)).   Studies: No results found.  Scheduled Meds: . gabapentin  100 mg Oral QPM  . gabapentin  300 mg Oral Daily  . heparin  5,000 Units Subcutaneous 3 times per day  . pantoprazole (PROTONIX) IV  40 mg Intravenous Daily   Continuous Infusions: . sodium chloride    . dextrose 5 % and 0.45% NaCl    . dextrose 5 % and 0.45% NaCl 100 mL/hr at 09/29/13 0439  . insulin (NOVOLIN-R) infusion 3.2 Units/hr (09/29/13 0756)     Marinda ElkFELIZ ORTIZ, ABRAHAM  [facility] Pager [phone]. If 8PM-8AM, please contact night-coverage at [email or web address], password [email or web address] 09/29/2013, 8:58 AM  LOS: 0 days    **Disclaimer: This note may have  been dictated with voice recognition software. Similar sounding words can inadvertently be transcribed and this note may contain transcription errors which may not have been corrected upon publication of note.**

## 2013-09-29 NOTE — ED Notes (Signed)
Dr. Gwenlyn PerkingMadera at bedside assessing pt.

## 2013-09-29 NOTE — Progress Notes (Signed)
Patient's HR in the 130's-150's,per patient,eversince she fell in April and hurt her back,her HR been up.Patient asymptomatic at this time.T. Callahan,NP notified,no new orders.Will continue to monitor. Theresa [name], [name], [name]

## 2013-09-30 DIAGNOSIS — E1310 Other specified diabetes mellitus with ketoacidosis without coma: Secondary | ICD-10-CM | POA: Diagnosis not present

## 2013-09-30 DIAGNOSIS — R1115 Cyclical vomiting syndrome unrelated to migraine: Secondary | ICD-10-CM | POA: Diagnosis not present

## 2013-09-30 DIAGNOSIS — E1010 Type 1 diabetes mellitus with ketoacidosis without coma: Secondary | ICD-10-CM | POA: Diagnosis not present

## 2013-09-30 LAB — GLUCOSE, CAPILLARY
Glucose-Capillary: 104 mg/dL — ABNORMAL HIGH (ref 70–99)
Glucose-Capillary: 107 mg/dL — ABNORMAL HIGH (ref 70–99)
Glucose-Capillary: 119 mg/dL — ABNORMAL HIGH (ref 70–99)
Glucose-Capillary: 161 mg/dL — ABNORMAL HIGH (ref 70–99)
Glucose-Capillary: 172 mg/dL — ABNORMAL HIGH (ref 70–99)
Glucose-Capillary: 178 mg/dL — ABNORMAL HIGH (ref 70–99)
Glucose-Capillary: 180 mg/dL — ABNORMAL HIGH (ref 70–99)
Glucose-Capillary: 190 mg/dL — ABNORMAL HIGH (ref 70–99)
Glucose-Capillary: 192 mg/dL — ABNORMAL HIGH (ref 70–99)
Glucose-Capillary: 203 mg/dL — ABNORMAL HIGH (ref 70–99)
Glucose-Capillary: 229 mg/dL — ABNORMAL HIGH (ref 70–99)
Glucose-Capillary: 263 mg/dL — ABNORMAL HIGH (ref 70–99)
Glucose-Capillary: 271 mg/dL — ABNORMAL HIGH (ref 70–99)
Glucose-Capillary: 310 mg/dL — ABNORMAL HIGH (ref 70–99)

## 2013-09-30 LAB — BASIC METABOLIC PANEL
Anion gap: 10 (ref 5–15)
Anion gap: 15 (ref 5–15)
BUN: 3 mg/dL — ABNORMAL LOW (ref 6–23)
BUN: 3 mg/dL — ABNORMAL LOW (ref 6–23)
CO2: 22 mEq/L (ref 19–32)
CO2: 20 mEq/L (ref 19–32)
Calcium: 9.1 mg/dL (ref 8.4–10.5)
Calcium: 9.3 mg/dL (ref 8.4–10.5)
Chloride: 104 mEq/L (ref 96–112)
Chloride: 102 mEq/L (ref 96–112)
Creatinine, Ser: 0.46 mg/dL — ABNORMAL LOW (ref 0.50–1.10)
Creatinine, Ser: 0.41 mg/dL — ABNORMAL LOW (ref 0.50–1.10)
GFR calc Af Amer: >90 mL/min (ref >90)
GFR calc Af Amer: >90 mL/min (ref >90)
GFR calc non Af Amer: >90 mL/min (ref >90)
GFR calc non Af Amer: >90 mL/min (ref >90)
Glucose, Bld: 211 mg/dL — ABNORMAL HIGH (ref 70–99)
Glucose, Bld: 181 mg/dL — ABNORMAL HIGH (ref 70–99)
Potassium: 4.7 mEq/L (ref 3.7–5.3)
Potassium: 4.4 mEq/L (ref 3.7–5.3)
Sodium: 136 mEq/L — ABNORMAL LOW (ref 137–147)
Sodium: 137 mEq/L (ref 137–147)

## 2013-09-30 MED ORDER — INSULIN ASPART 100 UNIT/ML ~~LOC~~ SOLN
3.0000 [IU] | Freq: Three times a day (TID) | SUBCUTANEOUS | Status: DC
  Administered 2013-09-30 – 2013-10-01 (×4): 3 [IU] via SUBCUTANEOUS

## 2013-09-30 MED ORDER — INSULIN ASPART 100 UNIT/ML ~~LOC~~ SOLN
0.0000 [IU] | Freq: Three times a day (TID) | SUBCUTANEOUS | Status: DC
  Administered 2013-09-30 (×2): 2 [IU] via SUBCUTANEOUS
  Administered 2013-09-30: 3 [IU] via SUBCUTANEOUS
  Administered 2013-10-01: 5 [IU] via SUBCUTANEOUS

## 2013-09-30 MED ORDER — INSULIN GLARGINE 100 UNIT/ML ~~LOC~~ SOLN
10.0000 [IU] | Freq: Every day | SUBCUTANEOUS | Status: DC
  Filled 2013-09-30: qty 0.1

## 2013-09-30 MED ORDER — INSULIN ASPART 100 UNIT/ML ~~LOC~~ SOLN
0.0000 [IU] | Freq: Every day | SUBCUTANEOUS | Status: DC
  Administered 2013-09-30: 3 [IU] via SUBCUTANEOUS

## 2013-09-30 MED ORDER — INSULIN GLARGINE 100 UNIT/ML ~~LOC~~ SOLN
15.0000 [IU] | Freq: Every day | SUBCUTANEOUS | Status: DC
  Administered 2013-09-30: 15 [IU] via SUBCUTANEOUS
  Filled 2013-09-30: qty 0.15

## 2013-09-30 NOTE — Progress Notes (Signed)
INITIAL NUTRITION ASSESSMENT  DOCUMENTATION CODES Per approved criteria  -Not Applicable   INTERVENTION: 1.  Brief education; provided.  Discussed intake and importance of taking medication with patient. No handout provided as pt denies nutrition-related concerns. 2. General healthful diet; encourage intake of foods and beverages as able.  RD to follow and assess for nutritional adequacy. Consider supplements based on PO intake- which has significantly improved.   NUTRITION DIAGNOSIS: Inadequate oral intake related to nausea and vomiting as evidenced by pt report.   Monitor:  1.  Food/Beverage; pt meeting >/=90% estimated needs with tolerance. 2.  Wt/wt change; monitor trends 3. Knowledge; for nutrition-related questions.   Reason for Assessment: MST  23 y.o. female  Admitting Dx: DKA (diabetic ketoacidoses)  ASSESSMENT: Pt admitted for persistent nausea and vomiting.  RD met with pt who reports nausea and vomiting has resolved.  She attributes N/V to recent medication changes.  Pt states she manages her diabetes well at home and takes her medications as directed.    Lab Results  Component Value Date   HGBA1C 15.4* 07/26/2013   Pt states her blood sugars run high "for no reason." If nutrition issues arise, please consult RD.   Weight was decreased on admission likely related to acute dehydration.  No new wt obtained.   Pt states usual weight is 115 lbs.   Nutrition Focused Physical Exam: Subcutaneous Fat:  Orbital Region: WNL Upper Arm Region: WNL, decreased muscle tone Thoracic and Lumbar Region: mild wasting  Muscle:  Temple Region: WNL Clavicle Bone Region: moderate wasting Clavicle and Acromion Bone Region: mild wasting Scapular Bone Region: WNL Dorsal Hand: WNL Patellar Region: WNL Anterior Thigh Region: WNL Posterior Calf Region: WNL  Edema: none present  Pt is thin with some evidence of mild-moderate wasting which may be her normal habitus.  She does have  poorly controlled diabetes as evidenced by recent HgBA1C.  Pt is at high risk for malnutrition.   Height: Ht Readings from Last 1 Encounters:  09/29/13 '5\' 3"'  (1.6 m)    Weight: Wt Readings from Last 1 Encounters:  09/29/13 108 lb 1.6 oz (49.034 kg)    Ideal Body Weight: 115 lbs  % Ideal Body Weight: 93%  Wt Readings from Last 10 Encounters:  09/29/13 108 lb 1.6 oz (49.034 kg)  07/25/13 114 lb (51.71 kg)  07/19/13 115 lb 9.6 oz (52.436 kg)  07/14/13 119 lb 3.2 oz (54.069 kg)  07/09/13 115 lb (52.164 kg)  07/09/13 115 lb (52.164 kg)  05/24/13 116 lb (52.617 kg)  05/16/13 116 lb 6.4 oz (52.799 kg)  05/07/13 108 lb (48.988 kg)  01/05/13 115 lb (52.164 kg)    Usual Body Weight: 115 lbs per pt, last weighed prior to acute illness  % Usual Body Weight: 93%  BMI:  Body mass index is 19.15 kg/(m^2).  Estimated Nutritional Needs: Kcal: 1700-1900 Protein: 50-60g Fluid: >1.8 L/day  Skin: intact  Diet Order: Carb Control  EDUCATION NEEDS: -Education needs addressed   Intake/Output Summary (Last 24 hours) at 09/30/13 1020 Last data filed at 09/30/13 0300  Gross per 24 hour  Intake 1885.42 ml  Output      0 ml  Net 1885.42 ml    Last BM: WNL  Labs:   Recent Labs Lab 09/29/13 2240 09/30/13 0443 09/30/13 0750  NA 134* 136* 137  K 4.6 4.7 4.4  CL 102 104 102  CO2 '20 22 20  ' BUN 4* 3* 3*  CREATININE 0.46* 0.46* 0.41*  CALCIUM  9.0 9.1 9.3  GLUCOSE 257* 211* 181*    CBG (last 3)   Recent Labs  09/30/13 0610 09/30/13 0719 09/30/13 0811  GLUCAP 180* 190* 178*    Scheduled Meds: . gabapentin  100 mg Oral QPM  . gabapentin  300 mg Oral Daily  . heparin  5,000 Units Subcutaneous 3 times per day  . insulin aspart  0-5 Units Subcutaneous QHS  . insulin aspart  0-9 Units Subcutaneous TID WC  . insulin aspart  3 Units Subcutaneous TID WC  . insulin glargine  15 Units Subcutaneous QHS  . pantoprazole  40 mg Oral BID  . potassium chloride  40 mEq Oral TID   . traMADol  50 mg Oral QID    Continuous Infusions:   Past Medical History  Diagnosis Date  . Insulin dependent type 1 diabetes mellitus     Past Surgical History  Procedure Laterality Date  . Mass excision Right 07/10/2013    Procedure: MINOR EXCISION RIGHT NECK CYST;  Surgeon: Rozetta Nunnery, MD;  Location: [place];  Service: ENT;  Laterality: Right;    Brynda Greathouse, MS RD LDN Clinical Inpatient Dietitian Pager: [phone] Weekend/After hours pager: [phone]

## 2013-09-30 NOTE — Progress Notes (Signed)
[facility] PROGRESS NOTE  Assessment/Plan: Nausea with vomiting due to DKA (diabetic ketoacidoses) - Transition to lantus and SSI. BG still high. Increase lantus - Afebrile, no leukocytosis.  - no source of infection or ischemia or change in meds or new meds. - home in am.  Code Status: full Family Communication: none  Disposition Plan: inpatient   Consultants:  none  Procedures:  none  Antibiotics:  None  HPI/Subjective: nausea resovled.  Objective: Filed Vitals:   09/29/13 0357 09/29/13 0930 09/29/13 2123 09/30/13 0508  BP: 107/71 95/63 124/86 132/89  Pulse: 134 129 122 109  Temp: 99.7 F (37.6 C) 98.4 F (36.9 C) 98.5 F (36.9 C) 98 F (36.7 C)  TempSrc: Oral Oral Oral Oral  Resp: 21 18 19 18   Height: 5\' 3"  (1.6 m)     Weight: 46.811 kg (103 lb 3.2 oz)  49.034 kg (108 lb 1.6 oz)   SpO2: 100% 99% 99% 99%    Intake/Output Summary (Last 24 hours) at 09/30/13 0956 Last data filed at 09/30/13 0300  Gross per 24 hour  Intake 1885.42 ml  Output      0 ml  Net 1885.42 ml   Filed Weights   09/29/13 0357 09/29/13 2123  Weight: 46.811 kg (103 lb 3.2 oz) 49.034 kg (108 lb 1.6 oz)    Exam:  General: Alert, awake, oriented x3, in no acute distress.  HEENT: No bruits, no goiter.  Heart: Regular rate and rhythm. Lungs: Good air movement, clear Abdomen: Soft, nontender, nondistended, positive bowel sounds.    Data Reviewed: Basic Metabolic Panel:  Recent Labs Lab 09/29/13 1545 09/29/13 2105 09/29/13 2240 09/30/13 0443 09/30/13 0750  NA 136* 134* 134* 136* 137  K 3.8 4.3 4.6 4.7 4.4  CL 103 101 102 104 102  CO2 19 19 20 22 20   GLUCOSE 152* 301* 257* 211* 181*  BUN 4* 4* 4* 3* 3*  CREATININE 0.37* 0.43* 0.46* 0.46* 0.41*  CALCIUM 9.1 9.0 9.0 9.1 9.3   Liver Function Tests:  Recent Labs Lab 09/29/13 0010  AST 11  ALT 7  ALKPHOS 66  BILITOT 0.5  PROT 8.1  ALBUMIN 4.4    Recent Labs Lab 09/29/13 0010  LIPASE 9*   No  results found for this basename: AMMONIA,  in the last 168 hours CBC:  Recent Labs Lab 09/29/13 0010 09/29/13 0443  WBC 7.2 6.0  NEUTROABS 6.4  --   HGB 13.9 12.8  HCT 40.5 37.5  MCV 92.0 93.1  PLT 309 286   Cardiac Enzymes:  Recent Labs Lab 09/29/13 0820  TROPONINI <0.30   BNP (last 3 results) No results found for this basename: PROBNP,  in the last 8760 hours CBG:  Recent Labs Lab 09/30/13 0259 09/30/13 0504 09/30/13 0610 09/30/13 0719 09/30/13 0811  GLUCAP 172* 203* 180* 190* 178*    No results found for this or any previous visit (from the past 240 hour(s)).   Studies: No results found.  Scheduled Meds: . gabapentin  100 mg Oral QPM  . gabapentin  300 mg Oral Daily  . heparin  5,000 Units Subcutaneous 3 times per day  . insulin aspart  0-5 Units Subcutaneous QHS  . insulin aspart  0-9 Units Subcutaneous TID WC  . insulin aspart  3 Units Subcutaneous TID WC  . insulin glargine  15 Units Subcutaneous QHS  . pantoprazole  40 mg Oral BID  . potassium chloride  40 mEq Oral TID  . traMADol  50  mg Oral QID   Continuous Infusions:     Theresa [name], Theresa [name]  [facility] Pager [phone]. If 8PM-8AM, please contact night-coverage at [email or web address], password [email or web address] 09/30/2013, 9:56 AM  LOS: 1 day    **Disclaimer: This note may have been dictated with voice recognition software. Similar sounding words can inadvertently be transcribed and this note may contain transcription errors which may not have been corrected upon publication of note.**

## 2013-10-01 DIAGNOSIS — R1115 Cyclical vomiting syndrome unrelated to migraine: Secondary | ICD-10-CM | POA: Diagnosis not present

## 2013-10-01 DIAGNOSIS — E1310 Other specified diabetes mellitus with ketoacidosis without coma: Secondary | ICD-10-CM | POA: Diagnosis not present

## 2013-10-01 DIAGNOSIS — E1010 Type 1 diabetes mellitus with ketoacidosis without coma: Secondary | ICD-10-CM | POA: Diagnosis not present

## 2013-10-01 LAB — GLUCOSE, CAPILLARY: Glucose-Capillary: 293 mg/dL — ABNORMAL HIGH (ref 70–99)

## 2013-10-01 MED ORDER — INSULIN DETEMIR 100 UNIT/ML FLEXPEN: 25.0000 [IU] | Freq: Every day | SUBCUTANEOUS | Status: DC

## 2013-10-01 MED ORDER — INSULIN GLARGINE 100 UNIT/ML ~~LOC~~ SOLN
15.0000 [IU] | Freq: Two times a day (BID) | SUBCUTANEOUS | Status: DC
  Filled 2013-10-01 (×2): qty 0.15

## 2013-10-01 NOTE — Discharge Planning (Signed)
Patient discharged home in stable condition. Verbalizes understanding of all discharge instructions, including home medications and follow up appointments. 

## 2013-10-01 NOTE — Discharge Summary (Signed)
Physician Discharge Summary  Theresa [name] ZOX:[id number] DOB: 1990/12/14 DOA: 09/29/2013  PCP: Leanor Rubenstein, MD  Admit date: 09/29/2013 Discharge date: 10/01/2013  Time spent: 35 minutes  Recommendations for Outpatient Follow-up:  1. Follow up with PCP in 2 weeks. 2. Follow up with pain clinic on 7.27.2015   Discharge Diagnoses:  Principal Problem:   DKA (diabetic ketoacidoses) Active Problems:   Chronic pain   Nausea with vomiting   Discharge Condition: stable  Diet recommendation: carb modidifed  Filed Weights   09/29/13 0357 09/29/13 2123 09/30/13 2109  Weight: 46.811 kg (103 lb 3.2 oz) 49.034 kg (108 lb 1.6 oz) 48.8 kg (107 lb 9.4 oz)    History of present illness:  23 y.o. female with a past medical history significant for insulin-dependent type 1 diabetes, chronic pain syndrome (presumed fibromyalgia), GERD and neuropathy; presents to the emergency department complaining of nausea, vomiting and inability to keep anything down. Patient reports that secondary to this difficulty with fluid intake she has not been able to be compliant with her insulin. Patient symptoms is that approximately 48-72 hours prior to admission and the patient associates the discomfort and problems with the recent adjustments to her tramadol dosage to control chronic pain. Patient denies chest pain, cough, shortness of breath, dysuria, melena, hematemesis, hematochezia, headaches, blurred vision or any other acute complaints. In the ED patient was found to be in DKA with anion gap of 30 and bicarbonate of 12. On physical exam she is dehydrated. [facility] has been called to me the patient for further evaluation and treatment.    Hospital Course:  Nausea with vomiting due to DKA (diabetic ketoacidoses)  - started on insulin gtt,Transition to lantus and SSI. Once AG closed and HCO > 20.  - Afebrile, no leukocytosis.  - no source of infection or ischemia or change in meds or new meds.  - ? Due  to compliance. No infectious etiology or ischemia. Denies drugs   Procedures:  none (i.e. Studies not automatically included, echos, thoracentesis, etc; not x-rays)  Consultations:  none  Discharge Exam: Filed Vitals:   10/01/13 0553  BP: 144/105  Pulse: 115  Temp: 98.3 F (36.8 C)  Resp: 16    General: A&O x3 Cardiovascular: RRR Respiratory: good air movement CTA b/L  Discharge Instructions You were cared for by a hospitalist during your hospital stay. If you have any questions about your discharge medications or the care you received while you were in the hospital after you are discharged, you can call the unit and asked to speak with the hospitalist on call if the hospitalist that took care of you is not available. Once you are discharged, your primary care physician will handle any further medical issues. Please note that NO REFILLS for any discharge medications will be authorized once you are discharged, as it is imperative that you return to your primary care physician (or establish a relationship with a primary care physician if you do not have one) for your aftercare needs so that they can reassess your need for medications and monitor your lab values.  Discharge Instructions   Diet - low sodium heart healthy    Complete by:  As directed      Increase activity slowly    Complete by:  As directed             Medication List         aspirin-sod bicarb-citric acid 325 MG Tbef tablet  Commonly known as:  ALKA-SELTZER  Take 325 mg by mouth every 6 (six) hours as needed (for nausea).     bismuth subsalicylate 262 MG/15ML suspension  Commonly known as:  PEPTO BISMOL  Take 30 mLs by mouth every 6 (six) hours as needed for indigestion.     gabapentin 100 MG capsule  Commonly known as:  NEURONTIN  Take 100-300 mg by mouth 2 (two) times daily. 300mg  in the morning and 100mg  in the evening     insulin detemir 100 unit/ml Soln  Commonly known as:  LEVEMIR  Inject 0.25  mLs (25 Units total) into the skin at bedtime.     MOTRIN PM 200-38 MG Tabs  Generic drug:  Ibuprofen-Diphenhydramine Cit  Take 2 tablets by mouth at bedtime as needed (for pain and sleep).     NOVOLOG FLEXPEN 100 UNIT/ML FlexPen  Generic drug:  insulin aspart  Inject 5-20 Units into the skin 3 (three) times daily with meals.     tiZANidine 4 MG tablet  Commonly known as:  ZANAFLEX  Take 4 mg by mouth every 8 (eight) hours as needed for muscle spasms.     traMADol 50 MG tablet  Commonly known as:  ULTRAM  Take 100 mg by mouth 4 (four) times daily.       Allergies  Allergen Reactions  . Aloe Hives  . Olive Oil Hives       Follow-up Information   Follow up with Leanor RubensteinSUN,VYVYAN Y, MD.   Specialty:  Family Medicine   Contact information:   [address], [address] [place] [address] [phone]        The results of significant diagnostics from this hospitalization (including imaging, microbiology, ancillary and laboratory) are listed below for reference.    Significant Diagnostic Studies: No results found.  Microbiology: No results found for this or any previous visit (from the past 240 hour(s)).   Labs: Basic Metabolic Panel:  Recent Labs Lab 09/29/13 1545 09/29/13 2105 09/29/13 2240 09/30/13 0443 09/30/13 0750  NA 136* 134* 134* 136* 137  K 3.8 4.3 4.6 4.7 4.4  CL 103 101 102 104 102  CO2 19 19 20 22 20   GLUCOSE 152* 301* 257* 211* 181*  BUN 4* 4* 4* 3* 3*  CREATININE 0.37* 0.43* 0.46* 0.46* 0.41*  CALCIUM 9.1 9.0 9.0 9.1 9.3   Liver Function Tests:  Recent Labs Lab 09/29/13 0010  AST 11  ALT 7  ALKPHOS 66  BILITOT 0.5  PROT 8.1  ALBUMIN 4.4    Recent Labs Lab 09/29/13 0010  LIPASE 9*   No results found for this basename: AMMONIA,  in the last 168 hours CBC:  Recent Labs Lab 09/29/13 0010 09/29/13 0443  WBC 7.2 6.0  NEUTROABS 6.4  --   HGB 13.9 12.8  HCT 40.5 37.5  MCV 92.0 93.1  PLT 309 286   Cardiac  Enzymes:  Recent Labs Lab 09/29/13 0820  TROPONINI <0.30   BNP: BNP (last 3 results) No results found for this basename: PROBNP,  in the last 8760 hours CBG:  Recent Labs Lab 09/30/13 0719 09/30/13 0811 09/30/13 1157 09/30/13 1716 09/30/13 2106  GLUCAP 190* 178* 192* 229* 271*     Signed:  David StallFELIZ ORTIZ, Janesia Joswick  [facility] 10/01/2013, 8:02 AM

## 2013-11-06 ENCOUNTER — Other Ambulatory Visit: Payer: Self-pay | Admitting: Pain Medicine

## 2013-11-06 DIAGNOSIS — M545 Low back pain: Secondary | ICD-10-CM

## 2013-11-06 DIAGNOSIS — M79604 Pain in right leg: Secondary | ICD-10-CM

## 2013-11-06 DIAGNOSIS — M79605 Pain in left leg: Secondary | ICD-10-CM

## 2013-11-11 ENCOUNTER — Ambulatory Visit
Admission: RE | Admit: 2013-11-11 | Discharge: 2013-11-11 | Disposition: A | Discharge status: Home / Self Care | Payer: BC Managed Care – PPO | Source: Ambulatory Visit | Attending: Pain Medicine | Admitting: Pain Medicine

## 2013-11-11 DIAGNOSIS — M545 Low back pain: Secondary | ICD-10-CM

## 2013-11-11 DIAGNOSIS — M79605 Pain in left leg: Secondary | ICD-10-CM

## 2013-11-11 DIAGNOSIS — M79604 Pain in right leg: Secondary | ICD-10-CM

## 2013-11-11 MED ORDER — GADOBENATE DIMEGLUMINE 529 MG/ML IV SOLN
9.0000 mL | Freq: Once | INTRAVENOUS | Status: AC | PRN
  Administered 2013-11-11: 9 mL via INTRAVENOUS

## 2013-11-21 DIAGNOSIS — M792 Neuralgia and neuritis, unspecified: Secondary | ICD-10-CM | POA: Insufficient documentation

## 2013-11-21 DIAGNOSIS — L089 Local infection of the skin and subcutaneous tissue, unspecified: Secondary | ICD-10-CM | POA: Insufficient documentation

## 2013-11-30 ENCOUNTER — Ambulatory Visit (INDEPENDENT_AMBULATORY_CARE_PROVIDER_SITE_OTHER): Payer: BC Managed Care – PPO | Admitting: Family Medicine

## 2013-11-30 VITALS — BP 108/64 | HR 120 | Temp 98.1°F | Resp 18 | Ht 63.0 in | Wt 115.0 lb

## 2013-11-30 DIAGNOSIS — R52 Pain, unspecified: Secondary | ICD-10-CM

## 2013-11-30 DIAGNOSIS — R1013 Epigastric pain: Secondary | ICD-10-CM

## 2013-11-30 DIAGNOSIS — G8929 Other chronic pain: Secondary | ICD-10-CM

## 2013-11-30 NOTE — Patient Instructions (Signed)
I am sorry that you are having a hard time.  You can increase your tizantidine up to a max of 36 mg a day.  I will be in touch with your lab from today.  Otherwise I am sorry that I do not have a lot to add.  It does sound like you are seeing the appropriate specialists.  You might consider seeing a neurologist next

## 2013-11-30 NOTE — Progress Notes (Addendum)
[facility] [address] [place] [address] [phone]  Date:  11/30/2013   Name:  Theresa [name]   DOB:  06-Mar-1991   MRN:  [id number]  PCP:  Leanor Rubenstein, MD    Chief Complaint: dm check and nerve pain   History of Present Illness:  Theresa [name] is a 23 y.o. very pleasant female patient who presents with the following:  She states she was called today by our office because she needed a diabetes check.  She is not sure who called her why she was called. She sees an endocrinologist at [facility] for her DM1.  Dr. Wynelle Link is taking care of her.  She does not have a pump.  Her DM has been under "ok" control, glucose this am was under 150.    She states she has "nerve pain" that has persistent since she fell in the shower in April.  She did not seem to suffer any distinct injury at that time but notes that her entire body hurts since this incident. She has seen her PCP and a pain specialist for this. She has "stabbling pains all over my body."  The pains started in her back but also seem to be in her back and legs.  She will see her pain doctor in 2 weeks.  She has had an MRI of her spine and also EMGs.    Her most recent A1c that I can view was 15.4 in May.  She is not sure of her most recent A1c.  She just saw neurology earlier this month.    She has had an autoimmune work- up per another clinic.  Dr. Wynelle Link is getting her in to see a neurologist.   She is taking gabapentin up to 4000 mg.  She had been on Nucenta but this caused her skin to break out.  She is also taking zanaflex  QID, states that tramadol does not help "at all."    Pulse Readings from Last 3 Encounters:  11/30/13 136  10/01/13 145  09/12/13 121     Patient Active Problem List   Diagnosis Date Noted  . Chronic pain 09/29/2013  . Nausea with vomiting 09/29/2013  . DKA (diabetic ketoacidoses) 07/26/2013  . Type 1 diabetes mellitus 11/22/2011  . Yeast vaginitis 11/22/2011    Past Medical  History  Diagnosis Date  . Insulin dependent type 1 diabetes mellitus     Past Surgical History  Procedure Laterality Date  . Mass excision Right 07/10/2013    Procedure: MINOR EXCISION RIGHT NECK CYST;  Surgeon: Drema Halon, MD;  Location: [facility];  Service: ENT;  Laterality: Right;  . Cervical spine surgery      History  Substance Use Topics  . Smoking status: Never Smoker   . Smokeless tobacco: Not on file  . Alcohol Use: No    Family History  Problem Relation Age of Onset  . Diabetes Mother   . Migraines Mother   . Breast cancer Maternal Grandmother   . Thyroid disease Maternal Grandmother     Allergies  Allergen Reactions  . Aloe Hives  . Olive Oil Hives    Medication list has been reviewed and updated.  Current Outpatient Prescriptions on File Prior to Visit  Medication Sig Dispense Refill  . aspirin-sod bicarb-citric acid (ALKA-SELTZER) 325 MG TBEF tablet Take 325 mg by mouth every 6 (six) hours as needed (for nausea).      . bismuth subsalicylate (PEPTO BISMOL)  262 MG/15ML suspension Take 30 mLs by mouth every 6 (six) hours as needed for indigestion.      . gabapentin (NEURONTIN) 100 MG capsule Take 100-300 mg by mouth 2 (two) times daily.  in the morning and  in the evening      . Ibuprofen-Diphenhydramine Cit (MOTRIN PM) 200-38 MG TABS Take 2 tablets by mouth at bedtime as needed (for pain and sleep).      . insulin aspart (NOVOLOG FLEXPEN) 100 UNIT/ML FlexPen Inject 5-20 Units into the skin 3 (three) times daily with meals.      . insulin detemir (LEVEMIR) 100 unit/ml SOLN Inject 0.25 mLs (25 Units total) into the skin at bedtime.  1 vial  0  . tiZANidine (ZANAFLEX) 4 MG tablet Take 4 mg by mouth every 8 (eight) hours as needed for muscle spasms.      . traMADol (ULTRAM) 50 MG tablet Take 100 mg by mouth 4 (four) times daily.       No current facility-administered medications on file prior to visit.    Review of  Systems:  As per HPI- otherwise negative.   Physical Examination: Filed Vitals:   11/30/13 1757  BP: 108/64  Pulse: 136  Temp: 98.1 F (36.7 C)  Resp: 18   Filed Vitals:   11/30/13 1757  Height:  (1.6 m)  Weight: 115 lb (52.164 kg)   Body mass index is 20.38 kg/(m^2). Ideal Body Weight: Weight in (lb) to have BMI = 25: 140.8  GEN: WDWN, NAD, Non-toxic, A & O x 3, looks well HEENT: Atraumatic, Normocephalic. Neck supple. No masses, No LAD.  Bilateral TM wnl, oropharynx normal.  PEERL,EOMI.   Ears and Nose: No external deformity. CV: RRR, No M/G/R. No JVD. No thrill. No extra heart sounds. PULM: CTA B, no wheezes, crackles, rhonchi. No retractions. No resp. distress. No accessory muscle use. ABD: S, NT, ND. No rebound. No HSM. EXTR: No c/c/e NEURO Normal gait. Normal strength and DTR all extremities.  Normal flexion and extension of spine PSYCH: Normally interactive. Conversant. Not depressed or anxious appearing.  Calm demeanor.    Assessment and Plan: Abdominal pain, chronic, epigastric - Plan: CK  Total body pain  Explained that I do not know why her entire body has hurt since she fell 5 months ago.  However, she has already had what seems to be an extensive work- up and is seeing pain management.  She can increase her tizantidine if she likes, but she should check with the pain MD who is prescribing it first. If she does decide to go up on this asked her to titrate her dose slowly. I also do not know who called her/ why she was called by [facility] and asked to recheck with [place] regarding DM when she has an endocrinologist.  Suspect this may have been a mistake and encouraged her to call and check if she gets a message that does not make sense to her in the future.    Signed Abbe Amsterdam, MD  9/28: received her CK and gave her a call.  Let her know that her CK is normal/ low.  I do not see any relationship between her fall several months ago and the chronic pain all over  her body.  Let her know that I am sorry but I do not have any other ideas for he rat this time.  She plans to see her neurologist soon

## 2013-12-01 LAB — CK: Total CK: 23 U/L (ref 7–177)

## 2013-12-06 ENCOUNTER — Encounter (INDEPENDENT_AMBULATORY_CARE_PROVIDER_SITE_OTHER): Payer: BC Managed Care – PPO | Admitting: Ophthalmology

## 2013-12-06 DIAGNOSIS — E10311 Type 1 diabetes mellitus with unspecified diabetic retinopathy with macular edema: Secondary | ICD-10-CM

## 2013-12-06 DIAGNOSIS — E10341 Type 1 diabetes mellitus with severe nonproliferative diabetic retinopathy with macular edema: Secondary | ICD-10-CM

## 2013-12-06 DIAGNOSIS — H2513 Age-related nuclear cataract, bilateral: Secondary | ICD-10-CM

## 2013-12-06 DIAGNOSIS — H43813 Vitreous degeneration, bilateral: Secondary | ICD-10-CM

## 2013-12-14 ENCOUNTER — Other Ambulatory Visit (INDEPENDENT_AMBULATORY_CARE_PROVIDER_SITE_OTHER): Payer: BC Managed Care – PPO | Admitting: Ophthalmology

## 2013-12-14 DIAGNOSIS — E10311 Type 1 diabetes mellitus with unspecified diabetic retinopathy with macular edema: Secondary | ICD-10-CM

## 2013-12-14 DIAGNOSIS — E10341 Type 1 diabetes mellitus with severe nonproliferative diabetic retinopathy with macular edema: Secondary | ICD-10-CM

## 2013-12-24 ENCOUNTER — Encounter: Payer: Self-pay | Admitting: *Deleted

## 2013-12-25 ENCOUNTER — Encounter: Payer: Self-pay | Admitting: Neurology

## 2013-12-25 ENCOUNTER — Ambulatory Visit (INDEPENDENT_AMBULATORY_CARE_PROVIDER_SITE_OTHER): Payer: BC Managed Care – PPO | Admitting: Neurology

## 2013-12-25 VITALS — BP 130/78 | HR 126 | Ht 62.0 in | Wt 126.1 lb

## 2013-12-25 DIAGNOSIS — M542 Cervicalgia: Secondary | ICD-10-CM

## 2013-12-25 DIAGNOSIS — G894 Chronic pain syndrome: Secondary | ICD-10-CM

## 2013-12-25 DIAGNOSIS — G8929 Other chronic pain: Secondary | ICD-10-CM

## 2013-12-25 DIAGNOSIS — E1042 Type 1 diabetes mellitus with diabetic polyneuropathy: Secondary | ICD-10-CM

## 2013-12-25 LAB — CREATININE, SERUM: Creat: 0.55 mg/dL (ref 0.50–1.10)

## 2013-12-25 MED ORDER — AMITRIPTYLINE HCL 50 MG PO TABS: 50.0000 mg | ORAL_TABLET | Freq: Every day | ORAL | Status: DC

## 2013-12-25 NOTE — Patient Instructions (Addendum)
1.  MRI cervical spine wwo contrast 2.  Increase amitriptyline 50mg  at bedtime 3.  Reduce gabapentin to 800mg  three times daily 5.  Strongly encouraged for tight blood sugar control 6.  Return to clinic in 23-months

## 2013-12-25 NOTE — Progress Notes (Signed)
[facility] Clinic Note - Initial Visit   Date: 12/25/2013  Theresa [name] MRN: [id number] DOB: 10/01/1990   Dear Dr. Wynelle Link:  Thank you for your kind referral of Theresa [name] for consultation of generalized whole body pain. Although her history is well known to you, please allow [place] to reiterate it for the purpose of our medical record. The patient was accompanied to the clinic by self.      History of Present Illness: Theresa [name] is a 23 y.o. left-handed African American female with history of type 1 insulin dependent diabetes (diagnosed 11, HbA1c 15.4) for evaluation of whole body pain.    On April 27th 2015, she had out-patient elective surgery for neck abscess.  That evening, she slipped going out of the bath tub and says that the shower rod fell on her back.  Since then, she started having sharp, electrical pain over her low back. With each passing month, she reports the pain has migrated to various part of her body (legs, arms, abdomen, neck, back) and characterized as "sharp, electric, knife-like".  She feels that her low back feels very hot to touch.  Pain is located all the way from her neck to her feet and does not follow any pattern.  Although she tries to stay active, she says that by the end of the day her pain is worse. She went to Urgent Care who gave her hydrocodone, which provided temporary relief.  She saw Pain Management (Dr. Loraine Leriche) who tried her on gabapentin and tizanidine.    Previously tried medications include:  Hydromorphone, gabapentin 800mg  5 times daily, amitriptyline 25mg , Lyrica, Cymbalta.  She has been seeing [facility] who performed EMG of the legs on 11/01/2013 which reportedly showed neuropathy of the feet (per patient, no report available for review).  She also had MRI of the lumbar spine which was normal.  She tried physical therapy.  She reports noticing tingling of the feet after being told she has neuropathy  (early September).   Out-side paper records, electronic medical record, and images have been reviewed where available and summarized as:  Labs 07/25/2013: Hemoglobin A1c 15.4, TSH 0.74, at 65 antibody 1.8*        Lab Results  Component Value Date   CKTOTAL 23 11/30/2013   TROPONINI <0.30 09/29/2013    CT soft tissue neck 07/04/2013: 1. Right lateral neck lesion appears to arise from the dermis and is favored to represent a small abscess or infectious lesion with trace internal fluid. This does overlie and mildly indent the right parotid gland, but I do not favor it is arising from the parotid, which otherwise appears normal. Clinical followup to resolution recommended.  2. Minimal if any reactive lymph nodes in the region.  3. Otherwise normal neck CT.    Past Medical History  Diagnosis Date  . Insulin dependent type 1 diabetes mellitus   . Peripheral neuropathy   . Pain   . Seasonal allergies   . Insomnia     Past Surgical History  Procedure Laterality Date  . Mass excision Right 07/10/2013    Procedure: MINOR EXCISION RIGHT NECK CYST;  Surgeon: Drema Halon, MD;  Location: [facility];  Service: ENT;  Laterality: Right;  . Cervical spine surgery       Medications:  Current Outpatient Prescriptions on File Prior to Visit  Medication Sig Dispense Refill  . aspirin-sod bicarb-citric acid (ALKA-SELTZER) 325 MG TBEF tablet Take 325 mg by mouth every  6 (six) hours as needed (for nausea).      . bismuth subsalicylate (PEPTO BISMOL) 262 MG/15ML suspension Take 30 mLs by mouth every 6 (six) hours as needed for indigestion.      . gabapentin (NEURONTIN) 100 MG capsule Take 100-300 mg by mouth 2 (two) times daily. 300mg  in the morning and 100mg  in the evening      . Ibuprofen-Diphenhydramine Cit (MOTRIN PM) 200-38 MG TABS Take 2 tablets by mouth at bedtime as needed (for pain and sleep).      . insulin aspart (NOVOLOG FLEXPEN) 100 UNIT/ML FlexPen Inject 5-20 Units into  the skin 3 (three) times daily with meals.      . insulin detemir (LEVEMIR) 100 unit/ml SOLN Inject 0.25 mLs (25 Units total) into the skin at bedtime.  1 vial  0  . tiZANidine (ZANAFLEX) 4 MG tablet Take 4 mg by mouth every 8 (eight) hours as needed for muscle spasms.       No current facility-administered medications on file prior to visit.    Allergies:  Allergies  Allergen Reactions  . Aloe Hives  . Olive Oil Hives  . Lyrica [Pregabalin]   . Tapentadol     Family History: Family History  Problem Relation Age of Onset  . Diabetes Mother   . Migraines Mother   . Breast cancer Maternal Grandmother   . Thyroid disease Maternal Grandmother   . Hypertension Mother   . CVA Mother   . Cancer Maternal Grandfather     Social History: History   Social History  . Marital Status: Single    Spouse Name: N/A    Number of Children: N/A  . Years of Education: N/A   Occupational History  . Not on file.   Social History Main Topics  . Smoking status: Never Smoker   . Smokeless tobacco: Not on file  . Alcohol Use: No  . Drug Use: No  . Sexual Activity: No   Other Topics Concern  . Not on file   Social History Narrative   Student.  Not married.  Lives alone in a one story home.   Not working.    Review of Systems:  CONSTITUTIONAL: No fevers, chills, night sweats, or weight loss.   EYES: No visual changes or eye pain ENT: No hearing changes.  No history of nose bleeds.   RESPIRATORY: No cough, wheezing and shortness of breath.   CARDIOVASCULAR: Negative for chest pain, and palpitations.   GI: Negative for abdominal discomfort, blood in stools or black stools.  No recent change in bowel habits.   GU:  No history of incontinence.   MUSCLOSKELETAL: No history of joint pain or swelling.  No myalgias.   SKIN: Negative for lesions, rash, and itching.   HEMATOLOGY/ONCOLOGY: Negative for prolonged bleeding, bruising easily, and swollen nodes.  No history of cancer.     ENDOCRINE: Negative for cold or heat intolerance, polydipsia or goiter.   PSYCH:  No depression or anxiety symptoms.   NEURO: As Above.   Vital Signs:  BP 130/78  Pulse 126  Ht 5\' 2"  (1.575 m)  Wt 126 lb 2 oz (57.21 kg)  BMI 23.06 kg/m2  SpO2 99%  LMP 11/03/2013   General Medical Exam:   General:  Well appearing, comfortable.   Eyes/ENT: see cranial nerve examination.   Neck: No masses appreciated.  Full range of motion without tenderness.  No carotid bruits.  Negative Lhermitte's sign. Respiratory:  Clear to auscultation, good air entry  bilaterally.   Cardiac:  Regular rate and rhythm, no murmur.    Extremities:  No deformities, edema, or skin discoloration. Good capillary refill.   Skin:  Skin color, texture, turgor normal. No rashes or lesions.  Neurological Exam: MENTAL STATUS including orientation to time, place, person, recent and remote memory, attention span and concentration, language, and fund of knowledge is normal.  Speech is not dysarthric.  CRANIAL NERVES: II:  No visual field defects.  Unremarkable fundi.   III-IV-VI: Pupils equal round and reactive to light.  Normal conjugate, extra-ocular eye movements in all directions of gaze.  No nystagmus.  No ptosis.   V:  Normal facial sensation.  Jaw jerk is absent.   VII:  Normal facial symmetry and movements.  No pathologic facial reflexes.  VIII:  Normal hearing and vestibular function.   IX-X:  Normal palatal movement.   XI:  Normal shoulder shrug and head rotation.   XII:  Normal tongue strength and range of motion, no deviation or fasciculation.  MOTOR:  No atrophy, fasciculations or abnormal movements.  No pronator drift.  Tone is normal.    Right Upper Extremity:    Left Upper Extremity:    Deltoid  5/5   Deltoid  5/5   Biceps  5/5   Biceps  5/5   Triceps  5/5   Triceps  5/5   Wrist extensors  5/5   Wrist extensors  5/5   Wrist flexors  5/5   Wrist flexors  5/5   Finger extensors  5/5   Finger extensors   5/5   Finger flexors  5/5   Finger flexors  5/5   Dorsal interossei  5/5   Dorsal interossei  5/5   Abductor pollicis  5/5   Abductor pollicis  5/5   Tone (Ashworth scale)  0  Tone (Ashworth scale)  0   Right Lower Extremity:    Left Lower Extremity:    Hip flexors  5/5   Hip flexors  5/5   Hip extensors  5/5   Hip extensors  5/5   Knee flexors  5/5   Knee flexors  5/5   Knee extensors  5/5   Knee extensors  5/5   Dorsiflexors  5/5   Dorsiflexors  5/5   Plantarflexors  5/5   Plantarflexors  5/5   Toe extensors  5/5   Toe extensors  5/5   Toe flexors  5/5   Toe flexors  5/5   Tone (Ashworth scale)  0  Tone (Ashworth scale)  0   MSRs:  Right                                                                 Left brachioradialis 2+  brachioradialis 2+  biceps 2+  biceps 2+  triceps 2+  triceps 2+  patellar 1+  patellar 1+  ankle jerk trace  ankle jerk trace  Hoffman no  Hoffman no  plantar response down  plantar response down   SENSORY:  Normal and symmetric perception of light touch, pinprick, vibration, and proprioception.  Romberg's sign absent.   COORDINATION/GAIT: Normal finger-to- nose-finger and heel-to-shin.  Intact rapid alternating movements bilaterally.  Able to rise from a chair without using arms.  Gait narrow based and  stable. Tandem and stressed gait intact.     IMPRESSION/PLAN: Ms. Julious OkaLilly is a 23 year-old female with uncontrolled type 1 insulin-dependent diabetes (HbA1c 15.4) presenting for evaluation of whole-body pain.  Her neurological exam shows diminished reflexes in the legs which may be due to early neuropathy.  There is no motor or sensory deficits, making it difficult to localize her pain to a particular dermatomal or cutaneous nerve distrubution. Even a small fiber neuropathy is very atypical to affect the entire body from neck down.  I do not have her EMG, but clinic notes dictate early neuropathy affecting the feet.    Although her paresthesias of the feet are  most likely due to diabetic neuropathy, the widespread pain of her back and arm is difficult to attribute to neuropathy.  Imaging of the lumbar spine is normal.  I will order MRI cervical spine to evaluate for structural lesion of the spinal cord, but given lack of myelopathy findings, my suspicion is low.  Considerations include chronic pain syndrome vs fibromyalgia.  If imaging is negative, may consider screening for inflammatory markers and lupus.   In the meantime, I would recommend that she increases amitriptyline to 50mg  at bedtime.  She has no benefit with neurontin 800mg  5 times daily, which is quite a high dose so would recommend tapering to 800mg  TID, if there is no worsening of symptoms, she may stop it all together.  Previously tried medications include:  Hydromorphone, gabapentin 800mg  5 times daily, amitriptyline 25mg , Lyrica, Cymbalta.   PLAN/RECOMMENDATIONS:  1.  MRI cervical spine wwo contrast 2.  Increase amitriptyline 50mg  at bedtime 3.  Reduce gabapentin to 800mg  three times daily 5.  Strongly encouraged for tight blood sugar control 6.  Return to clinic in 783-months   The duration of this appointment visit was 50 minutes of face-to-face time with the patient.  Greater than 50% of this time was spent in counseling, explanation of diagnosis, planning of further management, and coordination of care.   Thank you for allowing me to participate in patient's care.  If I can answer any additional questions, I would be pleased to do so.    Sincerely,    Keilen Kahl K. Allena KatzPatel, DO

## 2013-12-26 NOTE — Progress Notes (Signed)
Note faxed.

## 2013-12-27 ENCOUNTER — Ambulatory Visit: Payer: BC Managed Care – PPO | Admitting: *Deleted

## 2014-01-01 ENCOUNTER — Ambulatory Visit
Admission: RE | Admit: 2014-01-01 | Discharge: 2014-01-01 | Disposition: A | Discharge status: Home / Self Care | Payer: BC Managed Care – PPO | Source: Ambulatory Visit | Attending: Neurology | Admitting: Neurology

## 2014-01-01 ENCOUNTER — Telehealth: Payer: Self-pay | Admitting: Neurology

## 2014-01-01 DIAGNOSIS — G894 Chronic pain syndrome: Secondary | ICD-10-CM

## 2014-01-01 DIAGNOSIS — E1042 Type 1 diabetes mellitus with diabetic polyneuropathy: Secondary | ICD-10-CM

## 2014-01-01 DIAGNOSIS — M542 Cervicalgia: Secondary | ICD-10-CM

## 2014-01-01 DIAGNOSIS — G8929 Other chronic pain: Secondary | ICD-10-CM

## 2014-01-01 MED ORDER — GADOBENATE DIMEGLUMINE 529 MG/ML IV SOLN
10.0000 mL | Freq: Once | INTRAVENOUS | Status: AC | PRN
  Administered 2014-01-01: 10 mL via INTRAVENOUS

## 2014-01-01 NOTE — Telephone Encounter (Signed)
Message copied by Silvio PateMCCRACKEN, JADE L on Tue Jan 01, 2014  2:04 PM ------      Message from: TAT, REBECCA S      Created: Tue Jan 01, 2014  1:22 PM       You can let pt know that her MRI of the cervical spine is normal ------

## 2014-01-01 NOTE — Telephone Encounter (Signed)
Left message on machine for patient to call back.

## 2014-01-02 NOTE — Telephone Encounter (Signed)
Patient made aware results are normal. She asked about a referral to a [facility] which she states was talked about at her visit. I did not see anything in her note - I made her aware that Dr Allena KatzPatel was unavailable this week, but I would get the message to her and we would get back to her to see if she would like to refer her somewhere else. She expressed appreciation.

## 2014-01-02 NOTE — Telephone Encounter (Signed)
Pt called/returning your call at 8:41AM. C/B [phone]

## 2014-01-04 ENCOUNTER — Other Ambulatory Visit: Payer: Self-pay | Admitting: Pain Medicine

## 2014-01-04 DIAGNOSIS — G8929 Other chronic pain: Secondary | ICD-10-CM

## 2014-01-07 ENCOUNTER — Encounter: Payer: Self-pay | Admitting: Neurology

## 2014-01-07 ENCOUNTER — Encounter (INDEPENDENT_AMBULATORY_CARE_PROVIDER_SITE_OTHER): Payer: BC Managed Care – PPO | Admitting: Ophthalmology

## 2014-01-07 NOTE — Telephone Encounter (Signed)
Please inform patient that additional blood work looking for lupus and screening for inflammatory conditions can be ordered (ANA, ESR, CRP).  I would recommend pain management referral, if patient is interested.  Drae Mitzel K. Posey Pronto, DO

## 2014-01-08 ENCOUNTER — Other Ambulatory Visit: Payer: Self-pay | Admitting: *Deleted

## 2014-01-08 ENCOUNTER — Ambulatory Visit
Admission: RE | Admit: 2014-01-08 | Discharge: 2014-01-08 | Disposition: A | Discharge status: Home / Self Care | Payer: BC Managed Care – PPO | Source: Ambulatory Visit | Attending: Pain Medicine | Admitting: Pain Medicine

## 2014-01-08 DIAGNOSIS — R52 Pain, unspecified: Secondary | ICD-10-CM

## 2014-01-08 DIAGNOSIS — G8929 Other chronic pain: Secondary | ICD-10-CM

## 2014-01-08 MED ORDER — GADOBENATE DIMEGLUMINE 529 MG/ML IV SOLN: 12.0000 mL | Freq: Once | INTRAVENOUS | Status: AC | PRN

## 2014-01-08 NOTE — Telephone Encounter (Signed)
I spoke with Toni Amendourtney and she does want the lab work and referral.  Lab orders placed and referral faxed.

## 2014-01-08 NOTE — Telephone Encounter (Signed)
Left message for patient to call me back. 

## 2014-01-09 ENCOUNTER — Other Ambulatory Visit: Payer: Self-pay | Admitting: Neurology

## 2014-01-09 LAB — C-REACTIVE PROTEIN: CRP: <0.5 mg/dL (ref <0.60)

## 2014-01-10 ENCOUNTER — Encounter: Payer: PRIVATE HEALTH INSURANCE | Admitting: Women's Health

## 2014-01-10 LAB — SEDIMENTATION RATE: Sed Rate: 20 mm/hr (ref 0–22)

## 2014-01-10 LAB — ANA: Anti Nuclear Antibody(ANA): NEGATIVE

## 2014-01-14 ENCOUNTER — Encounter (INDEPENDENT_AMBULATORY_CARE_PROVIDER_SITE_OTHER): Payer: BC Managed Care – PPO | Admitting: Ophthalmology

## 2014-01-14 DIAGNOSIS — E10311 Type 1 diabetes mellitus with unspecified diabetic retinopathy with macular edema: Secondary | ICD-10-CM

## 2014-01-14 DIAGNOSIS — E10341 Type 1 diabetes mellitus with severe nonproliferative diabetic retinopathy with macular edema: Secondary | ICD-10-CM

## 2014-01-14 DIAGNOSIS — H43813 Vitreous degeneration, bilateral: Secondary | ICD-10-CM

## 2014-01-14 DIAGNOSIS — E10331 Type 1 diabetes mellitus with moderate nonproliferative diabetic retinopathy with macular edema: Secondary | ICD-10-CM

## 2014-01-15 ENCOUNTER — Encounter: Payer: BC Managed Care – PPO | Attending: Family Medicine | Admitting: *Deleted

## 2014-01-15 ENCOUNTER — Encounter: Payer: Self-pay | Admitting: *Deleted

## 2014-01-15 VITALS — Ht 63.0 in | Wt 135.0 lb

## 2014-01-15 DIAGNOSIS — Z713 Dietary counseling and surveillance: Secondary | ICD-10-CM | POA: Diagnosis not present

## 2014-01-15 DIAGNOSIS — Z794 Long term (current) use of insulin: Secondary | ICD-10-CM | POA: Insufficient documentation

## 2014-01-15 DIAGNOSIS — E108 Type 1 diabetes mellitus with unspecified complications: Secondary | ICD-10-CM | POA: Diagnosis not present

## 2014-01-15 NOTE — Progress Notes (Signed)
Introduction to Insulin Pump Therapy: 01/15/2014 Appt start time: 1500 end time:  1630.  Assessment:  This patient has DM 1 and their primary concerns today: to learn more about insulin pumps .  This patient is interested in learning more about insulin pump therapy because she wants to improve her DM 1 control  She is in severe chronic pain which started this past April. She has pursued pain management therapy with no success yet.  She states the cause has not been identify ed yet.  MEDICATIONS: Basal Insulin: 15 units of Levemir at night via pen  Bolus Insulin: 5 units of Novolog at meal time plus correction as needed via pen  Total of insulin doses per day 30 or more Other diabetes medications: none  This patient is  currently adjusting bolus insulin based BG at a correction ratio of 1/30 mg/dl above 161100 mg/dl This patient is not currently adjusting bolus insulin based on carb intake   Patient states knowledge of Carb Counting is fair  Last A1c was 14.0% on this date 11/13/13 Patient states complications from diabetes include neuropathy, retinopathy in both eyes, history of DKA twice this past summer Patient states they forget to take their insulin injection on average 1-2 times per week, typically if BG is too low and she is not eating much Patient states they have had hypoglycemia 0 times in the past month Patient states their biggest barrier with diabetes is poor control  Progress Towards Obtaining an Insulin PumpGoal(s):  In progress.  Patient states their expectations of pump therapy include: better BG control and fewer injections  Patient expresses understanding that for improved outcomes for their diabetes on an insulin pump they will:  Check BG at least 4 times per day  Change out pump infusion set at least every 3 days  Upload pump information to software on a regular basis so provider can assess patterns and make setting adjustments.     Intervention:    Taught  difference between delivery of insulin via syringe/pen compared to insulin pump.  Demonstrated improved insulin delivery via pump due to improved accuracy of dose and flexibility of adjusting bolus insulin based on carb intake and BG correction.  Discussed use of CGM, explained that it is a 2nd site, and the advantages of more Glucose data to allow her to be more pro-active with insulin administration  Provided information on which pumps have integrated CGM  Explained importance of testing BG at least 4 times per day for appropriate correction of high BG and prevention of DKA as applicable.  Emphasized importance of follow up after Pump Start for appropriate pump setting adjustments and on-going training on more advanced features.  Reviewed Carb Counting by [employer] so she can practice her Carb Counting prior to getting pump  Handouts given during visit include:  Intro to Pumping handout  Monitoring/Evaluation:    Patient does want to continue with pursuit of insulin pump.   Patient to contact this office as soon as pump is shipped to set up training appointment.

## 2014-01-16 ENCOUNTER — Ambulatory Visit (INDEPENDENT_AMBULATORY_CARE_PROVIDER_SITE_OTHER): Payer: BC Managed Care – PPO | Admitting: Neurology

## 2014-01-16 ENCOUNTER — Encounter: Payer: Self-pay | Admitting: Neurology

## 2014-01-16 VITALS — BP 130/90 | HR 124 | Ht 63.0 in | Wt 137.4 lb

## 2014-01-16 DIAGNOSIS — G894 Chronic pain syndrome: Secondary | ICD-10-CM

## 2014-01-16 DIAGNOSIS — E1042 Type 1 diabetes mellitus with diabetic polyneuropathy: Secondary | ICD-10-CM

## 2014-01-16 MED ORDER — AMITRIPTYLINE HCL 75 MG PO TABS: 75.0000 mg | ORAL_TABLET | Freq: Every day | ORAL | Status: DC

## 2014-01-16 NOTE — Progress Notes (Signed)
Follow-up Visit   Date: 01/16/2014  Sarahi Borland MRN: [id number] DOB: [birth year]   Interim History: Karmon Andis is a 23 y.o. 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  The patient was accompanied to the clinic by aunt who also provides collateral information.    History of present illness: On April 27th 2015, she had out-patient elective surgery for neck abscess. That evening, she slipped going out of the bath tub and says that the shower rod fell on her back. Since then, she started having sharp, electrical pain over her low back. With each passing month, she reports the pain has migrated to various part of her body (legs, arms, abdomen, neck, back) and characterized as "sharp, electric, knife-like". She feels that her low back feels very hot to touch. Pain is located all the way from her neck to her feet and does not follow any pattern. Although she tries to stay active, she says that by the end of the day her pain is worse. She went to Urgent Care who gave her hydrocodone, which provided temporary relief.  She saw Pain Management (Dr. Loraine Leriche) who tried her on gabapentin and tizanidine. Previously tried medications include: Hydromorphone, gabapentin 800mg  5 times daily, amitriptyline 25mg , Lyrica (lightheadedness, dyspnea), Cymbalta. She has been seeing [facility] who performed EMG of the legs on 11/01/2013 which reportedly showed neuropathy of the feet (per patient, no report available for review). She also had MRI of the lumbar spine which was normal. She tried physical therapy.  She reports noticing tingling of the feet after being told she has neuropathy (early September).   UPDATE 01/16/2014: Patient scheduled sooner return visit since her pain has been worsening.  Pain continues to be whole body involving her neck down to her feet, described  as stinging.  She saw pain management last week who recommended increasing gabapentin 800mg  to 7 times per day, but she did not do this. Her blood glucose continues to fluctuate and is worse when she is in greater pain.  No new neurological symptoms.     Medications:  Current Outpatient Prescriptions on File Prior to Visit  Medication Sig Dispense Refill  . aspirin-sod bicarb-citric acid (ALKA-SELTZER) 325 MG TBEF tablet Take 325 mg by mouth every 6 (six) hours as needed (for nausea).    . bismuth subsalicylate (PEPTO BISMOL) 262 MG/15ML suspension Take 30 mLs by mouth every 6 (six) hours as needed for indigestion.    . gabapentin (NEURONTIN) 100 MG capsule Take 800 mg by mouth 5 (five) times daily.    . Ibuprofen-Diphenhydramine Cit (MOTRIN PM) 200-38 MG TABS Take 2 tablets by mouth at bedtime as needed (for pain and sleep).    . insulin aspart (NOVOLOG FLEXPEN) 100 UNIT/ML FlexPen Inject 5-20 Units into the skin 3 (three) times daily with meals.    . insulin detemir (LEVEMIR) 100 unit/ml SOLN Inject 0.25 mLs (25 Units total) into the skin at bedtime. 1 vial 0  . ONE TOUCH ULTRA TEST test strip     . tiZANidine (ZANAFLEX) 4 MG tablet Take 4 mg by mouth every 8 (eight) hours as needed for muscle spasms.     No current facility-administered medications on file prior to visit.    Allergies:  Allergies  Allergen Reactions  . Aloe Hives  . Olive Oil Hives  . Lyrica [Pregabalin]   . Tapentadol  Review of Systems:  CONSTITUTIONAL: No fevers, chills, night sweats, or weight loss.   EYES: No visual changes or eye pain ENT: No hearing changes.  No history of nose bleeds.   RESPIRATORY: No cough, wheezing and shortness of breath.   CARDIOVASCULAR: Negative for chest pain, and palpitations.   GI: Negative for abdominal discomfort, blood in stools or black stools.  No recent change in bowel habits.   GU:  No history of incontinence.   MUSCLOSKELETAL: No history of joint pain or swelling.   No myalgias.   SKIN: Negative for lesions, rash, and itching.   ENDOCRINE: Negative for cold or heat intolerance, polydipsia or goiter.   PSYCH:  + depression or anxiety symptoms.   NEURO: As Above.   Vital Signs:  BP 130/90 mmHg  Pulse 124  Ht 5\' 3"  (1.6 m)  Wt 137 lb 6 oz (62.313 kg)  BMI 24.34 kg/m2  SpO2 99%  Neurological Exam: MENTAL STATUS including orientation to time, place, person, recent and remote memory, attention span and concentration, language, and fund of knowledge is normal.  Speech is not dysarthric.  CRANIAL NERVES:  Pupils equal round and reactive to light.  Normal conjugate, extra-ocular eye movements in all directions of gaze.  No ptosis. Face is symmetric.   MOTOR:  Motor strength is 5/5 in all extremities.  MSRs:  Reflexes are 2+/4 throughout, except 1+ Achilles bilaterally.  SENSORY:  Intact to pin prick and vibration throughout.  COORDINATION/GAIT:  Gait narrow based and stable.   Data: Labs 07/25/2013: Hemoglobin A1c 15.4, TSH 0.74, at 65 antibody 1.8* Labs 11/30/2013:  CK 23  MRI cervical spine 01/01/2014:  Normal MRI of the cervical spine.  MRI brain wwo contrast 01/08/2014:  Negative.  CT soft tissue neck 07/04/2013: 1. Right lateral neck lesion appears to arise from the dermis and is favored to represent a small abscess or infectious lesion with trace internal fluid. This does overlie and mildly indent the right parotid gland, but I do not favor it is arising from the parotid, which otherwise appears normal. Clinical followup to resolution recommended.  2. Minimal if any reactive lymph nodes in the region.  3. Otherwise normal neck CT.    IMPRESSION: Ms. Julious OkaLilly is a 23 year-old female with uncontrolled type 1 insulin-dependent diabetes (HbA1c 15.4) returning with whole-body pain. Her neurological exam shows diminished reflexes in the legs which may be due to early neuropathy. There is no motor or sensory deficits, making it difficult to  localize her pain to a particular dermatomal or cutaneous nerve distrubution. Even a small fiber neuropathy is very atypical to affect the entire body from neck down.  MRI brain and cervical spine did not show any abnormalities.  Although her paresthesias of the feet are most likely due to diabetic neuropathy, the widespread pain of her back and arm is difficult to attribute to neuropathy. Imaging of the brain, cervical spine, and lumbar spine is normal.  I recommended having repeat EMG of the arm and leg to look for polyradiculoneuropathy as this could manifest with more generalized pain, however patient is reluctant to have repeat EMG because she says that her neuropathy started after she had the EMG of her legs.    From a symptom management stand-point, she is interested in seeking a second opinion at an academic center, so will [facility] to [facility].  In the meantime, I would recommend that she increases amitriptyline to 75mg  at bedtime.  Previously tried medications include:  Hydromorphone, gabapentin 800mg  5 times daily, amitriptyline 25mg , Lyrica, Cymbalta.   PLAN/RECOMMENDATIONS:  [facility] Increase amitriptyline 75mg  at bedtime and continue gabapentin 800mg  five times daily Patient to forward your EMG results to my office to review.  Going forward, if symptoms do not improve, this test may need to be repeated. Return to clinic in 892-months   The duration of this appointment visit was 30 minutes of face-to-face time with the patient.  Greater than 50% of this time was spent in counseling, explanation of diagnosis, planning of further management, and coordination of care.   Thank you for allowing me to participate in patient's care.  If I can answer any additional questions, I would be pleased to do so.    Sincerely,    Donika K. Allena KatzPatel, DO

## 2014-01-16 NOTE — Patient Instructions (Addendum)
[facility] Increase amitriptyline 75mg  at bedtime Please forward your EMG results to my office.  Going forward, if symptoms do not improve, this test may need to be repeated. I'll see you back at your scheduled appointment in January.

## 2014-01-21 ENCOUNTER — Ambulatory Visit: Payer: BC Managed Care – PPO | Admitting: Neurology

## 2014-01-25 ENCOUNTER — Telehealth: Payer: Self-pay | Admitting: Neurology

## 2014-01-25 NOTE — Telephone Encounter (Signed)
Theresa [name] notified that appointment is on December 3 at 8:30.

## 2014-01-25 NOTE — Telephone Encounter (Signed)
Pt aunt wanda called and wants to know the status of the referral to [facility] please call wanda at [phone] or Geneen at [phone]

## 2014-01-28 ENCOUNTER — Encounter: Payer: Self-pay | Admitting: Women's Health

## 2014-01-28 ENCOUNTER — Ambulatory Visit (INDEPENDENT_AMBULATORY_CARE_PROVIDER_SITE_OTHER): Payer: BC Managed Care – PPO | Admitting: Women's Health

## 2014-01-28 VITALS — BP 110/69 | Ht 64.0 in | Wt 136.6 lb

## 2014-01-28 DIAGNOSIS — Z3041 Encounter for surveillance of contraceptive pills: Secondary | ICD-10-CM

## 2014-01-28 DIAGNOSIS — B373 Candidiasis of vulva and vagina: Secondary | ICD-10-CM

## 2014-01-28 DIAGNOSIS — B3731 Acute candidiasis of vulva and vagina: Secondary | ICD-10-CM

## 2014-01-28 DIAGNOSIS — Z113 Encounter for screening for infections with a predominantly sexual mode of transmission: Secondary | ICD-10-CM

## 2014-01-28 DIAGNOSIS — Z01419 Encounter for gynecological examination (general) (routine) without abnormal findings: Secondary | ICD-10-CM

## 2014-01-28 LAB — WET PREP FOR TRICH, YEAST, CLUE: Trich, Wet Prep: NONE SEEN

## 2014-01-28 MED ORDER — LEVONORGESTREL-ETHINYL ESTRAD 0.1-20 MG-MCG PO TABS: 1.0000 | ORAL_TABLET | Freq: Every day | ORAL | Status: DC

## 2014-01-28 MED ORDER — FLUCONAZOLE 100 MG PO TABS: ORAL_TABLET | ORAL | Status: DC

## 2014-01-28 NOTE — Progress Notes (Signed)
Gilford RileCourtney Bowdoin 02/19/1991 [id number]    History:    Presents for annual exam.  Cycles every 4-6 weeks, most months monthly. Not sexually active in several months, new partner. Monthly cycle when on pills. Several months ago. Normal Pap history. Gardasil series completed. Type 1 diabetes managed by Dr. Sharl MaKerr since age 23 poor control/compliance, hospitalized over the summer. Recently diagnosed with cataracts from the diabetes. Also experiencing widespread nerve pain attending a pain clinic.  Past medical history, past surgical history, family history and social history were all reviewed and documented in the EPIC chart. Was hoping to graduate from [employer] [facility], did not due to numerous health problems this past year.  ROS:  A  12 point ROS was performed and pertinent positives and negatives are included.  Exam:  Filed Vitals:   01/28/14 1447  BP: 110/69    General appearance:  Normal Thyroid:  Symmetrical, normal in size, without palpable masses or nodularity. Respiratory  Auscultation:  Clear without wheezing or rhonchi Cardiovascular  Auscultation:  Regular rate, without rubs, murmurs or gallops  Edema/varicosities:  Not grossly evident Abdominal  Soft,nontender, without masses, guarding or rebound.  Liver/spleen:  No organomegaly noted  Hernia:  None appreciated  Skin  Inspection:  Numerous healing lesions on her back and on her abdomen at waist   Breasts: Examined lying and sitting.     Right: Without masses, retractions, discharge or axillary adenopathy.     Left: Without masses, retractions, discharge or axillary adenopathy. Gentitourinary   Inguinal/mons:  Normal without inguinal adenopathy  External genitalia:  Normal  BUS/Urethra/Skene's glands:  Normal  Vagina:  Erythemic, wet prep positive for yeast  Cervix:  Normal  Uterus:   normal in size, shape and contour.  Midline and mobile  Adnexa/parametria:     Rt: Without masses or tenderness.   Lt: Without masses or  tenderness.  Anus and perineum: Normal    Assessment/Plan:  23 y.o. SBF G0 for annual exam.     Type 1 diabetes-poor control /compliance-Dr. Sharl MaKerr Yeast vaginitis STD screen Contraception management Bilateral cataracts-follow-up scheduled Widespread chronic nerve pain - pain management neurologist managing  Plan: Diflucan 102 tablets today and repeat in 3 days if needed 1 as needed. Contraception options reviewed, will try less prescription, proper use, start up instructions reviewed. Risk for blood clots and strokes reviewed, reviewed importance of not becoming pregnant until diabetes is well-controlled. SBE's, regular exercise, keep scheduled follow-up appointments. UA, GC/Chlamydia, HIV, hep B, C, RPR. Pap normal 2014, new screening guidelines reviewed.    Harrington ChallengerYOUNG,Joyceline Maiorino J [facility] HospitalWHNP, 4:48 PM 01/28/2014

## 2014-01-28 NOTE — Patient Instructions (Signed)
Health Maintenance - 17-24 Years Old SCHOOL PERFORMANCE After high school, you may attend college or technical or vocational school, enroll in the [employer], or enter the workforce. PHYSICAL, SOCIAL, AND EMOTIONAL DEVELOPMENT  One hour of regular physical activity daily is recommended. Continue to participate in sports.  Develop your own interests and consider community service or volunteerism.  Make decisions about college and work plans.  Throughout these years, you should assume responsibility for your own health care. Increasing independence is important for you.  You may be exploring your sexual identity. Understand that you should never be in a situation that makes you feel uncomfortable, and tell your partner if you do not want to engage in sexual activity.  Body image may become important to you. Be mindful that eating disorders can develop at this time. Talk to your parents or other caregivers if you have concerns about body image, weight gain, or losing weight.  You may notice mood disturbances, depression, anxiety, attention problems, or trouble with alcohol. Talk to your health care provider if you have concerns about mental illness.  Set limits for yourself and talk with your parents or other caregivers about independent decision making.  Handle conflict without physical violence.  Avoid loud noises which may impair hearing.  Limit television and computer time to 2 hours each day. Individuals who engage in excessive inactivity are more likely to become overweight. RECOMMENDED IMMUNIZATIONS  Influenza vaccine.  All adults should be immunized every year.  All adults, including pregnant women and people with hives-only allergy to eggs, can receive the inactivated influenza (IIV) vaccine.  Adults aged 18-49 years can receive the recombinant influenza (RIV) vaccine. The RIV vaccine does not contain any egg protein.  Tetanus, diphtheria, and acellular pertussis (Td, Tdap)  vaccine.  Pregnant women should receive 1 dose of Tdap vaccine during each pregnancy. The dose should be obtained regardless of the length of time since the last dose. Immunization is preferred during the 27th to 36th week of gestation.  An adult who has not previously received Tdap or who does not know his or her vaccine status should receive 1 dose of Tdap. This initial dose should be followed by tetanus and diphtheria toxoids (Td) booster doses every 10 years.  Adults with an unknown or incomplete history of completing a 3-dose immunization series with Td-containing vaccines should begin or complete a primary immunization series including a Tdap dose.  Adults should receive a Td booster every 10 years.  Varicella vaccine.  An adult without evidence of immunity to varicella should receive 2 doses or a second dose if he or she has previously received 1 dose.  Pregnant females who do not have evidence of immunity should receive the first dose after pregnancy. This first dose should be obtained before leaving the health care facility. The second dose should be obtained 4-8 weeks after the first dose.  Human papillomavirus (HPV) vaccine.  Females aged 13-26 years who have not received the vaccine previously should obtain the 3-dose series.  The vaccine is not recommended for pregnant females. However, pregnancy testing is not needed before receiving a dose. If a female is found to be pregnant after receiving a dose, no treatment is needed. In that case, the remaining doses should be delayed until after the pregnancy.  Males aged 67-21 years who have not received the vaccine previously should receive the 3-dose series. Males aged 22-26 years may be immunized.  Immunization is recommended through the age of 40 years for any  female who has sex with males and did not get any or all doses earlier.  Immunization is recommended for any person with an immunocompromised condition through the age of 69  years if he or she did not get any or all doses earlier.  During the 3-dose series, the second dose should be obtained 4-8 weeks after the first dose. The third dose should be obtained 24 weeks after the first dose and 16 weeks after the second dose.  Measles, mumps, and rubella (MMR) vaccine.  Adults born in [birth year] or later should have 1 or more doses of MMR vaccine unless there is a contraindication to the vaccine or there is laboratory evidence of immunity to each of the three diseases.  A routine second dose of MMR vaccine should be obtained at least 28 days after the first dose for students attending postsecondary schools, health care workers, and international travelers.  For females of childbearing age, rubella immunity should be determined. If there is no evidence of immunity, females who are not pregnant should be vaccinated. If there is no evidence of immunity, females who are pregnant should delay immunization until after pregnancy.  Pneumococcal 13-valent conjugate (PCV13) vaccine.  When indicated, a person who is uncertain of his or her immunization history and has no record of immunization should receive the PCV13 vaccine.  An adult aged 31 years or older who has certain medical conditions and has not been previously immunized should receive 1 dose of PCV13 vaccine. This PCV13 should be followed with a dose of pneumococcal polysaccharide (PPSV23) vaccine. The PPSV23 vaccine dose should be obtained at least 8 weeks after the dose of PCV13 vaccine.  An adult aged 88 years or older who has certain medical conditions and previously received 1 or more doses of PPSV23 vaccine should receive 1 dose of PCV13. The PCV13 vaccine dose should be obtained 1 or more years after the last PPSV23 vaccine dose.  Pneumococcal polysaccharide (PPSV23) vaccine.  When PCV13 is also indicated, PCV13 should be obtained first.  An adult younger than age 24 years who has certain medical conditions should be  immunized.  Any person who resides in a long-term care facility should be immunized.  An adult smoker should be immunized.  People with an immunocompromised condition and certain other conditions should receive both PCV13 and PPSV23 vaccines.  People with human immunodeficiency virus (HIV) infection should be immunized as soon as possible after diagnosis.  Immunization during chemotherapy or radiation therapy should be avoided.  Routine use of PPSV23 vaccine is not recommended for American Indians, [place] Natives, or people younger than 65 years unless there are medical conditions that require PPSV23 vaccine.  When indicated, people who have unknown immunization and have no record of immunization should receive PPSV23 vaccine.  One-time revaccination 5 years after the first dose of PPSV23 is recommended for people aged 19-64 years who have chronic kidney failure, nephrotic syndrome, asplenia, or immunocompromised conditions.  Meningococcal vaccine.  Adults with asplenia or persistent complement component deficiencies should receive 2 doses of quadrivalent meningococcal conjugate (MenACWY-D) vaccine. The doses should be obtained at least 2 months apart.  Microbiologists working with certain meningococcal bacteria, [place] recruits, people at risk during an outbreak, and people who travel to or live in countries with a high rate of meningitis should be immunized.  A first-year college student up through age 60 years who is living in a residence hall should receive a dose if he or she did not receive a dose on  or after his or her 16th birthday.  Adults who have certain high-risk conditions should receive one or more doses of vaccine.  Hepatitis A vaccine.  Adults who wish to be protected from this disease, have certain high-risk conditions, work with hepatitis A-infected animals, work in hepatitis A research labs, or travel to or work in countries with a high rate of hepatitis A should be  immunized.  Adults who were previously unvaccinated and who anticipate close contact with an international adoptee during the first 60 days after arrival in the [place] from a country with a high rate of hepatitis A should be immunized.  Hepatitis B vaccine.  Adults who wish to be protected from this disease, have certain high-risk conditions, may be exposed to blood or other infectious body fluids, are household contacts or sex partners of hepatitis B positive people, are clients or workers in certain care facilities, or travel to or work in countries with a high rate of hepatitis B should be immunized.  Haemophilus influenzae type b (Hib) vaccine.  A previously unvaccinated person with asplenia or sickle cell disease or having a scheduled splenectomy should receive 1 dose of Hib vaccine.  Regardless of previous immunization, a recipient of a hematopoietic stem cell transplant should receive a 3-dose series 6-12 months after his or her successful transplant.  Hib vaccine is not recommended for adults with HIV infection. TESTING  Annual screening for vision and hearing problems is recommended. Vision should be screened at least once between 18-21 years of age.  You may be screened for anemia or tuberculosis.  You should have a blood test to check for high cholesterol.  You should be screened for alcohol and drug use.  If you are sexually active, you may be screened for sexually transmitted infections (STIs), pregnancy, or HIV. You should be screened for STIs if:  Your sexual activity has changed since the last screening test, and you are at an increased risk for chlamydia or gonorrhea. Ask your health care provider if you are at risk.  If you are at an increased risk for hepatitis B, you should be screened for this virus. You are considered at high risk for hepatitis B if you:  Were born in a country where hepatitis B occurs often. Talk with your health care provider about which  countries are considered high risk.  Have parents who were born in a high-risk country and have not received a shot to protect against hepatitis B (hepatitis B vaccine).  Have HIV or AIDS.  Use needles to inject street drugs.  Live with or have sex with someone who has hepatitis B.  Are a man who has sex with other men (MSM).  Get hemodialysis treatment.  Take certain medicines for conditions like cancer, organ transplantation, or autoimmune conditions. NUTRITION   You should:  Have three servings of low-fat milk and dairy products daily. If you do not drink milk or consume dairy products, you should eat calcium-enriched foods, such as juice, bread, or cereal. Dark, leafy greens or canned fish are alternate sources of calcium.  Drink plenty of water. Fruit juice should be limited to 8-12 oz (240-360 mL) each day. Sugary beverages and sodas should be avoided.  Avoid eating foods high in fat, salt, or sugar, such as chips, candy, and cookies.  Avoid fast foods and limit eating out at restaurants.  Try not to skip meals, especially breakfast. You should eat a variety of vegetables, fruits, and lean meats.  Eat meals   together as a family whenever possible. ORAL HEALTH Brush your teeth twice a day and floss at least once a day. You should have two dental exams a year.  SKIN CARE You should wear sunscreen when out in the sun. TALK TO SOMEONE ABOUT:  Precautions against pregnancy, contraception, and sexually transmitted infections.  Taking a prescription medicine daily to prevent HIV infection if you are at risk of being infected with HIV. This is called preexposure prophylaxis (PrEP). You are at risk if you:  Are a female who has sex with other males (MSM).  Are heterosexual and sexually active with more than one partner.  Take drugs by injection.  Are sexually active with a partner who has HIV.  Whether you are at high risk of being infected with HIV. If you choose to begin  PrEP, you should first be tested for HIV. You should then be tested every 3 months for as long as you are taking PrEP.  Drug, tobacco, and alcohol use among your friends or at friends' homes. Smoking tobacco or marijuana and taking drugs have health consequences and may impact your brain development.  Appropriate use of over-the-counter or prescription medicines.  Driving guidelines and riding with friends.  The risks of drinking and driving or boating. Call someone if you have been drinking or using drugs and need a ride. WHAT'S NEXT? Visit your pediatrician or family physician once a year. By Meric Joye adulthood, you should transition from your pediatrician to a family physician or internal medicine specialist. If you are a female and are sexually active, you may want to begin annual physical exams with a gynecologist. Document Released: 05/20/2006 Document Revised: 02/27/2013 Document Reviewed: 06/09/2006 [facility] Patient Information 2015 [place], [place]. This information is not intended to replace advice given to you by your health care provider. Make sure you discuss any questions you have with your health care provider.

## 2014-01-29 ENCOUNTER — Encounter: Payer: Self-pay | Admitting: Women's Health

## 2014-01-29 LAB — URINALYSIS W MICROSCOPIC + REFLEX CULTURE
Bacteria, UA: NONE SEEN
Bilirubin Urine: NEGATIVE
Casts: NONE SEEN
Crystals: NONE SEEN
Glucose, UA: NEGATIVE mg/dL
Hgb urine dipstick: NEGATIVE
Ketones, ur: NEGATIVE mg/dL
Leukocytes, UA: NEGATIVE
Nitrite: NEGATIVE
Protein, ur: NEGATIVE mg/dL
Specific Gravity, Urine: 1.013 (ref 1.005–1.030)
Squamous Epithelial / LPF: NONE SEEN
Urobilinogen, UA: 0.2 mg/dL (ref 0.0–1.0)
pH: 7 (ref 5.0–8.0)

## 2014-01-29 LAB — HEPATITIS B SURFACE ANTIGEN: Hepatitis B Surface Ag: NEGATIVE

## 2014-01-29 LAB — RPR

## 2014-01-29 LAB — HEPATITIS C ANTIBODY: HCV Ab: NEGATIVE

## 2014-01-29 LAB — GC/CHLAMYDIA PROBE AMP
CT Probe RNA: NEGATIVE
GC Probe RNA: NEGATIVE

## 2014-01-29 LAB — HIV ANTIBODY (ROUTINE TESTING W REFLEX): HIV 1&2 Ab, 4th Generation: NONREACTIVE

## 2014-02-07 DIAGNOSIS — M546 Pain in thoracic spine: Secondary | ICD-10-CM | POA: Insufficient documentation

## 2014-02-08 ENCOUNTER — Encounter (INDEPENDENT_AMBULATORY_CARE_PROVIDER_SITE_OTHER): Payer: BC Managed Care – PPO | Admitting: Ophthalmology

## 2014-02-15 ENCOUNTER — Encounter (INDEPENDENT_AMBULATORY_CARE_PROVIDER_SITE_OTHER): Payer: BC Managed Care – PPO | Admitting: Ophthalmology

## 2014-02-15 DIAGNOSIS — H43813 Vitreous degeneration, bilateral: Secondary | ICD-10-CM

## 2014-02-15 DIAGNOSIS — E10311 Type 1 diabetes mellitus with unspecified diabetic retinopathy with macular edema: Secondary | ICD-10-CM

## 2014-02-15 DIAGNOSIS — E10331 Type 1 diabetes mellitus with moderate nonproliferative diabetic retinopathy with macular edema: Secondary | ICD-10-CM

## 2014-02-25 ENCOUNTER — Emergency Department (HOSPITAL_COMMUNITY)
Admission: EM | Admit: 2014-02-25 | Discharge: 2014-02-25 | Disposition: A | Discharge status: Home / Self Care | Payer: BC Managed Care – PPO | Attending: Emergency Medicine | Admitting: Emergency Medicine

## 2014-02-25 ENCOUNTER — Encounter (HOSPITAL_COMMUNITY): Payer: Self-pay | Admitting: Emergency Medicine

## 2014-02-25 DIAGNOSIS — Z79899 Other long term (current) drug therapy: Secondary | ICD-10-CM | POA: Insufficient documentation

## 2014-02-25 DIAGNOSIS — Z794 Long term (current) use of insulin: Secondary | ICD-10-CM | POA: Insufficient documentation

## 2014-02-25 DIAGNOSIS — E1065 Type 1 diabetes mellitus with hyperglycemia: Secondary | ICD-10-CM | POA: Insufficient documentation

## 2014-02-25 DIAGNOSIS — Z8709 Personal history of other diseases of the respiratory system: Secondary | ICD-10-CM | POA: Insufficient documentation

## 2014-02-25 DIAGNOSIS — Z3202 Encounter for pregnancy test, result negative: Secondary | ICD-10-CM | POA: Insufficient documentation

## 2014-02-25 DIAGNOSIS — R739 Hyperglycemia, unspecified: Secondary | ICD-10-CM | POA: Diagnosis present

## 2014-02-25 DIAGNOSIS — G629 Polyneuropathy, unspecified: Secondary | ICD-10-CM | POA: Diagnosis not present

## 2014-02-25 DIAGNOSIS — Z8669 Personal history of other diseases of the nervous system and sense organs: Secondary | ICD-10-CM | POA: Insufficient documentation

## 2014-02-25 LAB — CBC WITH DIFFERENTIAL/PLATELET
Basophils Absolute: 0 10*3/uL (ref 0.0–0.1)
Basophils Relative: 1 % (ref 0–1)
Eosinophils Absolute: 0.2 10*3/uL (ref 0.0–0.7)
Eosinophils Relative: 5 % (ref 0–5)
HCT: 32.8 % — ABNORMAL LOW (ref 36.0–46.0)
Hemoglobin: 10.7 g/dL — ABNORMAL LOW (ref 12.0–15.0)
Lymphocytes Relative: 37 % (ref 12–46)
Lymphs Abs: 1.8 10*3/uL (ref 0.7–4.0)
MCH: 30.1 pg (ref 26.0–34.0)
MCHC: 32.6 g/dL (ref 30.0–36.0)
MCV: 92.4 fL (ref 78.0–100.0)
Monocytes Absolute: 0.3 10*3/uL (ref 0.1–1.0)
Monocytes Relative: 6 % (ref 3–12)
Neutro Abs: 2.5 10*3/uL (ref 1.7–7.7)
Neutrophils Relative %: 51 % (ref 43–77)
Platelets: 323 10*3/uL (ref 150–400)
RBC: 3.55 MIL/uL — ABNORMAL LOW (ref 3.87–5.11)
RDW: 12.5 % (ref 11.5–15.5)
WBC: 4.8 10*3/uL (ref 4.0–10.5)

## 2014-02-25 LAB — URINALYSIS, ROUTINE W REFLEX MICROSCOPIC
Bilirubin Urine: NEGATIVE
Glucose, UA: >1000 mg/dL — AB
Hgb urine dipstick: NEGATIVE
Ketones, ur: NEGATIVE mg/dL
Leukocytes, UA: NEGATIVE
Nitrite: NEGATIVE
Protein, ur: NEGATIVE mg/dL
Specific Gravity, Urine: 1.016 (ref 1.005–1.030)
Urobilinogen, UA: 0.2 mg/dL (ref 0.0–1.0)
pH: 6.5 (ref 5.0–8.0)

## 2014-02-25 LAB — BASIC METABOLIC PANEL
Anion gap: 10 (ref 5–15)
BUN: 15 mg/dL (ref 6–23)
CO2: 24 mEq/L (ref 19–32)
Calcium: 9.4 mg/dL (ref 8.4–10.5)
Chloride: 99 mEq/L (ref 96–112)
Creatinine, Ser: 0.46 mg/dL — ABNORMAL LOW (ref 0.50–1.10)
GFR calc Af Amer: >90 mL/min (ref >90)
GFR calc non Af Amer: >90 mL/min (ref >90)
Glucose, Bld: 333 mg/dL — ABNORMAL HIGH (ref 70–99)
Potassium: 5.7 mEq/L — ABNORMAL HIGH (ref 3.7–5.3)
Sodium: 133 mEq/L — ABNORMAL LOW (ref 137–147)

## 2014-02-25 LAB — URINE MICROSCOPIC-ADD ON

## 2014-02-25 LAB — CBG MONITORING, ED
Glucose-Capillary: 271 mg/dL — ABNORMAL HIGH (ref 70–99)
Glucose-Capillary: 194 mg/dL — ABNORMAL HIGH (ref 70–99)

## 2014-02-25 LAB — PREGNANCY, URINE: Preg Test, Ur: NEGATIVE

## 2014-02-25 MED ORDER — MORPHINE SULFATE 4 MG/ML IJ SOLN
6.0000 mg | Freq: Once | INTRAMUSCULAR | Status: AC
Start: 2014-02-25 — End: 2014-02-25
  Administered 2014-02-25: 6 mg via INTRAVENOUS
  Filled 2014-02-25: qty 2

## 2014-02-25 MED ORDER — SODIUM CHLORIDE 0.9 % IV BOLUS (SEPSIS)
2000.0000 mL | Freq: Once | INTRAVENOUS | Status: AC
  Administered 2014-02-25: 2000 mL via INTRAVENOUS

## 2014-02-25 MED ORDER — LIDOCAINE 5 % EX PTCH
2.0000 | MEDICATED_PATCH | Freq: Once | CUTANEOUS | Status: DC
Start: 2014-02-25 — End: 2014-02-25
  Administered 2014-02-25: 2 via TRANSDERMAL
  Filled 2014-02-25: qty 2

## 2014-02-25 MED ORDER — ONDANSETRON HCL 4 MG/2ML IJ SOLN
4.0000 mg | Freq: Once | INTRAMUSCULAR | Status: AC
  Administered 2014-02-25: 4 mg via INTRAVENOUS
  Filled 2014-02-25: qty 2

## 2014-02-25 NOTE — ED Notes (Signed)
CBG-271. Patient states normal CBG for her is under 200.

## 2014-02-25 NOTE — ED Notes (Signed)
CBG 194, notified RN

## 2014-02-25 NOTE — ED Provider Notes (Signed)
CSN: [id number]     Arrival date & time 02/25/14  0343 History   First MD Initiated Contact with Patient 02/25/14 0502     Chief Complaint  Patient presents with  . Hyperglycemia  . Pain     (Consider location/radiation/quality/duration/timing/severity/associated sxs/prior Treatment) HPI Gilford RileCourtney Shearon is a 23 y.o. female with past medical history of type 1 diabetes, chronic body pain coming in with hyperglycemia. Patient states over the past couple of months she's had neuropathy throughout her entire body. Her endocrinologist and neurologist have not been able to find out a cause. Her pain becomes severe she becomes hyperglycemic. She caught her and his nausea advised her to come to emergency department for treatment. She states she's taken about 30 units of NovoLog today and her sugars remain over 200, she normally only needs 10 units per day. Denies any recent infections, fevers, coughing.  She has had decreased urinary frequency. She denies any chest pain or shortness of breath. Patient has no further complaints.  10 Systems reviewed and are negative for acute change except as noted in the HPI.     Past Medical History  Diagnosis Date  . Insulin dependent type 1 diabetes mellitus   . Peripheral neuropathy   . Pain   . Seasonal allergies   . Insomnia    Past Surgical History  Procedure Laterality Date  . Mass excision Right 07/10/2013    Procedure: MINOR EXCISION RIGHT NECK CYST;  Surgeon: Drema Halonhristopher E Newman, MD;  Location: [facility];  Service: ENT;  Laterality: Right;  . Cervical spine surgery     Family History  Problem Relation Age of Onset  . Diabetes Mother   . Migraines Mother   . Breast cancer Maternal Grandmother   . Thyroid disease Maternal Grandmother   . Hypertension Mother   . CVA Mother   . Cancer Maternal Grandfather    History  Substance Use Topics  . Smoking status: Never Smoker   . Smokeless tobacco: Not on file  . Alcohol Use: No    OB History    Gravida Para Term Preterm AB TAB SAB Ectopic Multiple Living   1    1  1         Review of Systems    Allergies  Aloe; Olive oil; Lyrica; and Tapentadol  Home Medications   Prior to Admission medications   Medication Sig Start Date End Date Taking? Authorizing Provider  amitriptyline (ELAVIL) 75 MG tablet Take 1 tablet (75 mg total) by mouth at bedtime. 01/16/14  Yes Donika K Patel, DO  gabapentin (NEURONTIN) 800 MG tablet Take 800 mg by mouth 5 (five) times daily.   Yes Historical Provider, MD  insulin aspart (NOVOLOG FLEXPEN) 100 UNIT/ML FlexPen Inject 5-20 Units into the skin 3 (three) times daily with meals.   Yes Historical Provider, MD  insulin detemir (LEVEMIR) 100 unit/ml SOLN Inject 0.25 mLs (25 Units total) into the skin at bedtime. Patient taking differently: Inject 15 Units into the skin at bedtime.  10/01/13  Yes Marinda ElkAbraham Feliz Ortiz, MD  aspirin-sod bicarb-citric acid (ALKA-SELTZER) 325 MG TBEF tablet Take 325 mg by mouth every 6 (six) hours as needed (for nausea).    Historical Provider, MD  bismuth subsalicylate (PEPTO BISMOL) 262 MG/15ML suspension Take 30 mLs by mouth every 6 (six) hours as needed for indigestion.    Historical Provider, MD  fluconazole (DIFLUCAN) 100 MG tablet Take 2 tablets today and as needed Patient not taking: Reported on 02/25/2014 01/28/14  Harrington ChallengerNancy J Young, NP  Ibuprofen-Diphenhydramine Cit (MOTRIN PM) 200-38 MG TABS Take 2 tablets by mouth at bedtime as needed (for pain and sleep).    Historical Provider, MD  levonorgestrel-ethinyl estradiol (AVIANE,ALESSE,LESSINA) 0.1-20 MG-MCG tablet Take 1 tablet by mouth daily. Patient not taking: Reported on 02/25/2014 01/28/14   Harrington ChallengerNancy J Young, NP  [facility] test strip  12/24/13   Historical Provider, MD  tiZANidine (ZANAFLEX) 4 MG tablet Take 4 mg by mouth every 8 (eight) hours as needed for muscle spasms.    Historical Provider, MD   BP 136/88 mmHg  Pulse 117  Temp(Src) 97.8  F (36.6 C) (Oral)  Resp 18  Ht 5\' 3"  (1.6 m)  Wt 149 lb 9.6 oz (67.858 kg)  BMI 26.51 kg/m2  SpO2 100%  LMP 02/15/2014 (Approximate) Physical Exam  Constitutional: She is oriented to person, place, and time. She appears well-developed and well-nourished. No distress.  HENT:  Head: Normocephalic and atraumatic.  Nose: Nose normal.  Mouth/Throat: Oropharynx is clear and moist. No oropharyngeal exudate.  Eyes: Conjunctivae and EOM are normal. Pupils are equal, round, and reactive to light. No scleral icterus.  Neck: Normal range of motion. Neck supple. No JVD present. No tracheal deviation present. No thyromegaly present.  Cardiovascular: Normal rate, regular rhythm and normal heart sounds.  Exam reveals no gallop and no friction rub.   No murmur heard. Pulmonary/Chest: Effort normal and breath sounds normal. No respiratory distress. She has no wheezes. She exhibits no tenderness.  Abdominal: Soft. Bowel sounds are normal. She exhibits no distension and no mass. There is no tenderness. There is no rebound and no guarding.  Musculoskeletal: Normal range of motion. She exhibits no edema or tenderness.  Lymphadenopathy:    She has no cervical adenopathy.  Neurological: She is alert and oriented to person, place, and time. No cranial nerve deficit. She exhibits normal muscle tone.  Skin: Skin is warm and dry. No rash noted. She is not diaphoretic. No erythema. No pallor.  Nursing note and vitals reviewed.   ED Course  Procedures (including critical care time) Labs Review Labs Reviewed  CBC WITH DIFFERENTIAL - Abnormal; Notable for the following:    RBC 3.55 (*)    Hemoglobin 10.7 (*)    HCT 32.8 (*)    All other components within normal limits  BASIC METABOLIC PANEL - Abnormal; Notable for the following:    Sodium 133 (*)    Potassium 5.7 (*)    Glucose, Bld 333 (*)    Creatinine, Ser 0.46 (*)    All other components within normal limits  URINALYSIS, ROUTINE W REFLEX MICROSCOPIC  - Abnormal; Notable for the following:    Glucose, UA >1000 (*)    All other components within normal limits  URINE MICROSCOPIC-ADD ON - Abnormal; Notable for the following:    Squamous Epithelial / LPF MANY (*)    All other components within normal limits  CBG MONITORING, ED - Abnormal; Notable for the following:    Glucose-Capillary 271 (*)    All other components within normal limits  PREGNANCY, URINE  CBG MONITORING, ED    Imaging Review No results found.   EKG Interpretation None      MDM   Final diagnoses:  None    Patient presents emergency department out of concern for hyperglycemia and pain throughout her body. She was given morphine and Zofran for pain relief. She was given 2 L of IV fluids for blood sugar over 300.  There is no anion gap or evidence of DKA. Patient's potassium is 5.7. I suspect these will both decrease with fluids, will recheck BMP prior to discharge. Also there was hemolysis on the blood work. Upon my repeat evaluation of the patient her pain has remarkably subsided. She continues to only have neuropathy pain in her feet but denies her entire body hurting currently. Patient was also initially tachycardic, she states this has been normal for her since the chronic pain began.  Her vital signs remain within her normal limits and she is safe for discharge.    Tomasita Crumble, MD 02/25/14 [phone]

## 2014-02-25 NOTE — ED Notes (Signed)
Pt arrives with c/o chronic pain, states her PCP Dr. Sun/endocrinologist haven't been able to figure out a reason for her pain. 8/10, neck down to feet. States it's effecting her diabetes. Endocrinologist sent her here.

## 2014-02-25 NOTE — Discharge Instructions (Signed)
High Blood Sugar Theresa [name], you were seen today for high blood sugar and chronic pain.  Follow up with your primary physician for continued management.  If symptoms worsen, come back to the ED for repeat evaluation. Thank you. High blood sugar (hyperglycemia) means that the level of sugar in your blood is higher than it should be. Signs of high blood sugar include:  Feeling thirsty.  Frequent peeing (urinating).  Feeling tired or sleepy.  Dry mouth.  Vision changes.  Feeling weak.  Feeling hungry but losing weight.  Numbness and tingling in your hands or feet.  Headache. When you ignore these signs, your blood sugar may keep going up. These problems may get worse, and other problems may begin. HOME CARE  Check your blood sugars as told by your doctor. Write down the numbers with the date and time.  Take the right amount of insulin or diabetes pills at the right time. Write down the dose with date and time.  Refill your insulin or diabetes pills before running out.  Watch what you eat. Follow your meal plan.  Drink liquids without sugar, such as water. Check with your doctor if you have kidney or heart disease.  Follow your doctor's orders for exercise. Exercise at the same time of day.  Keep your doctor's appointments. GET HELP RIGHT AWAY IF:   You have trouble thinking or are confused.  You have fast breathing with fruity smelling breath.  You pass out (faint).  You have 2 to 3 days of high blood sugars and you do not know why.  You have chest pain.  You are feeling sick to your stomach (nauseous) or throwing up (vomiting).  You have sudden vision changes. MAKE SURE YOU:   Understand these instructions.  Will watch your condition.  Will get help right away if you are not doing well or get worse. Document Released: 12/20/2008 Document Revised: 05/17/2011 Document Reviewed: 12/20/2008 [facility] Patient Information 2015 [employer], [place]. This information is not  intended to replace advice given to you by your health care provider. Make sure you discuss any questions you have with your health care provider. Chronic Pain Chronic pain can be defined as pain that is off and on and lasts for 3-6 months or longer. Many things cause chronic pain, which can make it difficult to make a diagnosis. There are many treatment options available for chronic pain. However, finding a treatment that works well for you may require trying various approaches until the right one is found. Many people benefit from a combination of two or more types of treatment to control their pain. SYMPTOMS  Chronic pain can occur anywhere in the body and can range from mild to very severe. Some types of chronic pain include:  Headache.  Low back pain.  Cancer pain.  Arthritis pain.  Neurogenic pain. This is pain resulting from damage to nerves. People with chronic pain may also have other symptoms such as:  Depression.  Anger.  Insomnia.  Anxiety. DIAGNOSIS  Your health care provider will help diagnose your condition over time. In many cases, the initial focus will be on excluding possible conditions that could be causing the pain. Depending on your symptoms, your health care provider may order tests to diagnose your condition. Some of these tests may include:   Blood tests.   CT scan.   MRI.   X-rays.   Ultrasounds.   Nerve conduction studies.  You may need to see a specialist.  TREATMENT  Finding  treatment that works well may take time. You may be referred to a pain specialist. He or she may prescribe medicine or therapies, such as:   Mindful meditation or yoga.  Shots (injections) of numbing or pain-relieving medicines into the spine or area of pain.  Local electrical stimulation.  Acupuncture.   Massage therapy.   Aroma, color, light, or sound therapy.   Biofeedback.   Working with a physical therapist to keep from getting stiff.    Regular, gentle exercise.   Cognitive or behavioral therapy.   Group support.  Sometimes, surgery may be recommended.  HOME CARE INSTRUCTIONS   Take all medicines as directed by your health care provider.   Lessen stress in your life by relaxing and doing things such as listening to calming music.   Exercise or be active as directed by your health care provider.   Eat a healthy diet and include things such as vegetables, fruits, fish, and lean meats in your diet.   Keep all follow-up appointments with your health care provider.   Attend a support group with others suffering from chronic pain. SEEK MEDICAL CARE IF:   Your pain gets worse.   You develop a new pain that was not there before.   You cannot tolerate medicines given to you by your health care provider.   You have new symptoms since your last visit with your health care provider.  SEEK IMMEDIATE MEDICAL CARE IF:   You feel weak.   You have decreased sensation or numbness.   You lose control of bowel or bladder function.   Your pain suddenly gets much worse.   You develop shaking.  You develop chills.  You develop confusion.  You develop chest pain.  You develop shortness of breath.  MAKE SURE YOU:  Understand these instructions.  Will watch your condition.  Will get help right away if you are not doing well or get worse. Document Released: 11/14/2001 Document Revised: 10/25/2012 Document Reviewed: 08/18/2012 [facility] Patient Information 2015 [place], [place]. This information is not intended to replace advice given to you by your health care provider. Make sure you discuss any questions you have with your health care provider.

## 2014-02-27 DIAGNOSIS — G5793 Unspecified mononeuropathy of bilateral lower limbs: Secondary | ICD-10-CM | POA: Insufficient documentation

## 2014-03-12 ENCOUNTER — Ambulatory Visit: Payer: Self-pay | Admitting: Neurology

## 2014-03-13 ENCOUNTER — Telehealth: Payer: Self-pay | Admitting: Neurology

## 2014-03-13 NOTE — Telephone Encounter (Signed)
Pt came into the office for 03/12/14 appt w/ Dr. Allena KatzPatel but apparently had an issue with her insurance, she rescheduled appt until month end to allow insurance issue to be corrected. 03/12/14 will not be considered a no show / Sherri S.

## 2014-03-20 ENCOUNTER — Encounter: Payer: BLUE CROSS/BLUE SHIELD | Attending: Family Medicine | Admitting: *Deleted

## 2014-03-20 DIAGNOSIS — Z713 Dietary counseling and surveillance: Secondary | ICD-10-CM | POA: Diagnosis not present

## 2014-03-20 DIAGNOSIS — Z794 Long term (current) use of insulin: Secondary | ICD-10-CM | POA: Insufficient documentation

## 2014-03-20 DIAGNOSIS — E109 Type 1 diabetes mellitus without complications: Secondary | ICD-10-CM

## 2014-03-20 DIAGNOSIS — E108 Type 1 diabetes mellitus with unspecified complications: Secondary | ICD-10-CM | POA: Diagnosis not present

## 2014-03-22 ENCOUNTER — Telehealth: Payer: Self-pay | Admitting: Neurology

## 2014-03-22 ENCOUNTER — Encounter (INDEPENDENT_AMBULATORY_CARE_PROVIDER_SITE_OTHER): Payer: BC Managed Care – PPO | Admitting: Ophthalmology

## 2014-03-22 ENCOUNTER — Ambulatory Visit: Payer: BC Managed Care – PPO | Admitting: Neurology

## 2014-03-22 NOTE — Telephone Encounter (Signed)
This is her second same-day appointment change.  Please be sure she is aware of our no-show and less than 24 hr appointment cancellation policy.   Theresa K. Allena KatzPatel, DO

## 2014-03-22 NOTE — Telephone Encounter (Signed)
Pt is sick and cant make appt resch from 03-22-14 to 03-25-14

## 2014-03-25 ENCOUNTER — Encounter: Payer: Self-pay | Admitting: Neurology

## 2014-03-25 ENCOUNTER — Ambulatory Visit (INDEPENDENT_AMBULATORY_CARE_PROVIDER_SITE_OTHER): Payer: BLUE CROSS/BLUE SHIELD | Admitting: Neurology

## 2014-03-25 VITALS — BP 130/90 | HR 114 | Ht 63.0 in | Wt 156.2 lb

## 2014-03-25 DIAGNOSIS — E1042 Type 1 diabetes mellitus with diabetic polyneuropathy: Secondary | ICD-10-CM

## 2014-03-25 DIAGNOSIS — G894 Chronic pain syndrome: Secondary | ICD-10-CM

## 2014-03-25 NOTE — Progress Notes (Signed)
Follow-up Visit   Date: @TODAY (<PARAMETER> error)@  Theresa [name] MRN: [id number] DOB: 11/10/1990   Interim History: Theresa [name] is a 24 y.o. 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  The patient was accompanied to the clinic by aunt who also provides collateral information.    History of present illness: On April 27th 2015, she had out-patient elective surgery for neck abscess. That evening, she slipped going out of the bath tub and says that the shower rod fell on her back. Since then, she started having sharp, electrical pain over her low back. With each passing month, she reports the pain has migrated to various part of her body (legs, arms, abdomen, neck, back) and characterized as "sharp, electric, knife-like". She feels that her low back feels very hot to touch. Pain is located all the way from her neck to her feet and does not follow any pattern. Although she tries to stay active, she says that by the end of the day her pain is worse. She went to Urgent Care who gave her hydrocodone, which provided temporary relief.  She saw Pain Management (Dr. Loraine LericheMark) who tried her on gabapentin and tizanidine. Previously tried medications include: Hydromorphone, gabapentin 800mg  5 times daily, amitriptyline 25mg , Lyrica (lightheadedness, dyspnea), Cymbalta. She has been seeing [facility] who performed EMG of the legs on 11/01/2013 which reportedly showed neuropathy of the feet (per patient, no report available for review). She also had MRI of the lumbar spine which was normal. She tried physical therapy.  She reports noticing tingling of the feet after being told she has neuropathy (early September).   UPDATE 01/16/2014: Patient scheduled sooner return visit since her pain has been worsening.  Pain continues to be whole body involving her neck down to her  feet, described as stinging.  She saw pain management last week who recommended increasing gabapentin 800mg  to 7 times per day, but she did not do this. Her blood glucose continues to fluctuate and is worse when she is in greater pain.  No new neurological symptoms.    UPDATE 03/25/2014:  [facility].  She remains on neurontin 800mg  TID, amitriptyline 100mg  and was started on topamax 25mg  last week. She continues to have whole-body pain which is not alleviated by any of the medications she takes.  She says that her sugars are better, with HbA1c ~7 (per her report).     Medications:  Current Outpatient Prescriptions on File Prior to Visit  Medication Sig Dispense Refill  . amitriptyline (ELAVIL) 75 MG tablet Take 1 tablet (75 mg total) by mouth at bedtime. 30 tablet 5  . aspirin-sod bicarb-citric acid (ALKA-SELTZER) 325 MG TBEF tablet Take 325 mg by mouth every 6 (six) hours as needed (for nausea).    . bismuth subsalicylate (PEPTO BISMOL) 262 MG/15ML suspension Take 30 mLs by mouth every 6 (six) hours as needed for indigestion.    . fluconazole (DIFLUCAN) 100 MG tablet Take 2 tablets today and as needed (Patient not taking: Reported on 02/25/2014) 15 tablet 0  . gabapentin (NEURONTIN) 800 MG tablet Take 800 mg by mouth 5 (five) times daily.    . Ibuprofen-Diphenhydramine Cit (MOTRIN PM) 200-38 MG TABS Take 2 tablets by mouth at bedtime as needed (for pain and sleep).    . insulin aspart (NOVOLOG FLEXPEN) 100 UNIT/ML FlexPen  Inject 5-20 Units into the skin 3 (three) times daily with meals.    . insulin detemir (LEVEMIR) 100 unit/ml SOLN Inject 0.25 mLs (25 Units total) into the skin at bedtime. (Patient taking differently: Inject 15 Units into the skin at bedtime. ) 1 vial 0  . levonorgestrel-ethinyl estradiol (AVIANE,ALESSE,LESSINA) 0.1-20 MG-MCG tablet Take 1 tablet by mouth daily. (Patient not taking: Reported on 02/25/2014) 3  Package 4  . ONE TOUCH ULTRA TEST test strip     . tiZANidine (ZANAFLEX) 4 MG tablet Take 4 mg by mouth every 8 (eight) hours as needed for muscle spasms.     No current facility-administered medications on file prior to visit.    Allergies:  Allergies  Allergen Reactions  . Aloe Hives  . Olive Oil Hives  . Lyrica [Pregabalin]   . Tapentadol      Review of Systems:  CONSTITUTIONAL: No fevers, chills, night sweats, or weight loss.   EYES: No visual changes or eye pain ENT: No hearing changes.  No history of nose bleeds.   RESPIRATORY: No cough, wheezing and shortness of breath.   CARDIOVASCULAR: Negative for chest pain, and palpitations.   GI: Negative for abdominal discomfort, blood in stools or black stools.  No recent change in bowel habits.   GU:  No history of incontinence.   MUSCLOSKELETAL: No history of joint pain or swelling.  No myalgias.   SKIN: Negative for lesions, rash, and itching.   ENDOCRINE: Negative for cold or heat intolerance, polydipsia or goiter.   PSYCH:  + depression or anxiety symptoms.   NEURO: As Above.   Vital Signs:  BP 130/90 mmHg  Pulse 114  Ht  (1.6 m)  Wt 156 lb 3 oz (70.846 kg)  BMI 27.67 kg/m2  SpO2 99%  LMP 02/15/2014 (Approximate)  Neurological Exam: MENTAL STATUS including orientation to time, place, person, recent and remote memory, attention span and concentration, language, and fund of knowledge is normal.  Speech is not dysarthric.  CRANIAL NERVES:  Pupils equal round and reactive to light.  Normal conjugate, extra-ocular eye movements in all directions of gaze.  No ptosis. Face is symmetric.   MOTOR:  Motor strength is 5/5 in all extremities.  MSRs:  Reflexes are 2+/4 throughout, except 1+ Achilles bilaterally.  SENSORY:  Intact to pin prick and vibration throughout.  COORDINATION/GAIT:  Gait narrow based and stable.   Data: Labs 07/25/2013: Hemoglobin A1c 15.4, TSH 0.74, at 65 antibody 1.8* Labs 11/30/2013:  CK  23  MRI cervical spine 01/01/2014:  Normal MRI of the cervical spine.  MRI brain wwo contrast 01/08/2014:  Negative.  CT soft tissue neck 07/04/2013: 1. Right lateral neck lesion appears to arise from the dermis and is favored to represent a small abscess or infectious lesion with trace internal fluid. This does overlie and mildly indent the right parotid gland, but I do not favor it is arising from the parotid, which otherwise appears normal. Clinical followup to resolution recommended.  2. Minimal if any reactive lymph nodes in the region.  3. Otherwise normal neck CT.    IMPRESSION/PLAN: Ms. Bachtel is a 24 year-old female with uncontrolled type 1 insulin-dependent diabetes (HbA1c 15.4) returning with whole-body pain. Pain is out-of-proportion than what is expected for diabetic neuropathy.  Although she may have some degree of underlying diabetic neuropathy affecting the feet, her symptoms are migratory and do not conform to any dermatomal or cutaneous nerve distrubution. Even a small fiber neuropathy is very atypical to  affect the entire body from neck down.She most likely has chronic pain syndrome.  Imaging of the brain, cervical spine, and lumbar spine is normal.  I recommended having repeat EMG of the arm and leg, however patient is reluctant to have repeat EMG because she says that her neuropathy started after she had the EMG of her legs.    At this juncture, management is symptomatic.  She is now seeing Pain Management at [facility]. I have no further neurological recommendations.   Return to clinic as needed   The duration of this appointment visit was 20 minutes of face-to-face time with the patient.  Greater than 50% of this time was spent in counseling, explanation of diagnosis, planning of further management, and coordination of care.   Thank you for allowing me to participate in patient's care.  If I can answer any additional questions, I would be pleased to do so.     Sincerely,    Conlan Miceli K. Allena Katz, DO

## 2014-03-26 ENCOUNTER — Telehealth: Payer: Self-pay | Admitting: Neurology

## 2014-03-26 NOTE — Telephone Encounter (Signed)
03/22/14 appt w/ Dr. Allena KatzPatel marked as a no show b/c the pt called the day of to canc / r/s but a no show letter will not be sent, the patient has since been seen with Dr. Allena KatzPatel / Oneita KrasSherri S.

## 2014-03-26 NOTE — Progress Notes (Signed)
Introduction to Insulin Pump Therapy: 03/20/2014  Appt start time: 0930 end time:  1030.  Assessment:  This patient has DM 1 and their primary concerns today: to discuss pump therapy in more detail. She is here with her Grandmother who appears supportive.  This patient is interested in learning more about insulin pump therapy because she wants to take fewer injections and to control BG better  MEDICATIONS: Basal Insulin: 15 units of Triceba at 6 PM  Bolus Insulin: 6 units of Novolog at each meal plus Sliding Scale for hyperglycemia via pen Total of insulin doses per day 36 Other diabetes medications: none  This patient is currently adjusting bolus insulin based BG with a Sliding Scale from [place] This patient is not currently adjusting bolus insulin   Patient states knowledge of Carb Counting is better but not excellent  Last A1c was 7.4%, improved from 15% 6 months ago  Progress Towards Obtaining an Insulin PumpGoal(s):  In progress.  Patient states their expectations of pump therapy include: better control by being able to deliver insulin more accurately Patient expresses understanding that for improved outcomes for their diabetes on an insulin pump they will:  Check BG 4-6 times per day  Change out pump infusion set at least every 3 days  Upload pump information to software on a regular basis so provider can assess patterns and make setting adjustments.     Intervention:    Reviewed difference between delivery of insulin via syringe/pen compared to insulin pump.  Demonstrated improved insulin delivery via pump due to improved accuracy of dose and flexibility of adjusting bolus insulin based on carb intake and BG correction.  Demonstrated 3 different pumps, insulin reservoir and infusion set options, and button pushing for bolus delivery of insulin through the pump  Explained importance of testing BG at least 4 times per day for appropriate correction of high BG and prevention of  DKA as applicable.  Emphasized importance of follow up after Pump Start for appropriate pump setting adjustments and on-going training on more advanced features.  Reviewed Carb counting and suggested she continue to practice   Handouts given during visit include:  Intro to Pumping Handout  Monitoring/Evaluation:    Patient does want to continue with pursuit of insulin pump.   She wants to pursue the Tandem Pump  She gave me permission to contact the local Rep for T[employer] to notify them of her interest and so pump can be initiated,which I did  Patient instructed to notify this office when pump is shipped so training and follow up appointments can be scheduled.

## 2014-03-27 ENCOUNTER — Ambulatory Visit: Payer: BC Managed Care – PPO | Admitting: Neurology

## 2014-04-01 ENCOUNTER — Ambulatory Visit: Payer: BC Managed Care – PPO | Admitting: Neurology

## 2014-04-04 ENCOUNTER — Encounter (INDEPENDENT_AMBULATORY_CARE_PROVIDER_SITE_OTHER): Payer: BLUE CROSS/BLUE SHIELD | Admitting: Ophthalmology

## 2014-04-04 DIAGNOSIS — E10339 Type 1 diabetes mellitus with moderate nonproliferative diabetic retinopathy without macular edema: Secondary | ICD-10-CM

## 2014-04-04 DIAGNOSIS — H2513 Age-related nuclear cataract, bilateral: Secondary | ICD-10-CM

## 2014-04-04 DIAGNOSIS — H43813 Vitreous degeneration, bilateral: Secondary | ICD-10-CM

## 2014-04-04 DIAGNOSIS — E10311 Type 1 diabetes mellitus with unspecified diabetic retinopathy with macular edema: Secondary | ICD-10-CM

## 2014-04-10 DIAGNOSIS — R52 Pain, unspecified: Secondary | ICD-10-CM | POA: Insufficient documentation

## 2014-04-19 ENCOUNTER — Other Ambulatory Visit: Payer: Self-pay | Admitting: Internal Medicine

## 2014-04-19 DIAGNOSIS — R140 Abdominal distension (gaseous): Secondary | ICD-10-CM

## 2014-04-19 DIAGNOSIS — R635 Abnormal weight gain: Secondary | ICD-10-CM

## 2014-04-26 ENCOUNTER — Ambulatory Visit
Admission: RE | Admit: 2014-04-26 | Discharge: 2014-04-26 | Disposition: A | Discharge status: Home / Self Care | Payer: BLUE CROSS/BLUE SHIELD | Source: Ambulatory Visit | Attending: Internal Medicine | Admitting: Internal Medicine

## 2014-04-26 DIAGNOSIS — R140 Abdominal distension (gaseous): Secondary | ICD-10-CM

## 2014-04-26 DIAGNOSIS — R635 Abnormal weight gain: Secondary | ICD-10-CM

## 2014-05-16 ENCOUNTER — Encounter (INDEPENDENT_AMBULATORY_CARE_PROVIDER_SITE_OTHER): Payer: BLUE CROSS/BLUE SHIELD | Admitting: Ophthalmology

## 2014-05-16 DIAGNOSIS — E10311 Type 1 diabetes mellitus with unspecified diabetic retinopathy with macular edema: Secondary | ICD-10-CM

## 2014-05-16 DIAGNOSIS — H25033 Anterior subcapsular polar age-related cataract, bilateral: Secondary | ICD-10-CM | POA: Diagnosis not present

## 2014-05-16 DIAGNOSIS — E10341 Type 1 diabetes mellitus with severe nonproliferative diabetic retinopathy with macular edema: Secondary | ICD-10-CM

## 2014-05-16 DIAGNOSIS — H43813 Vitreous degeneration, bilateral: Secondary | ICD-10-CM

## 2014-06-04 DIAGNOSIS — G4700 Insomnia, unspecified: Secondary | ICD-10-CM | POA: Insufficient documentation

## 2014-06-13 ENCOUNTER — Encounter (INDEPENDENT_AMBULATORY_CARE_PROVIDER_SITE_OTHER): Payer: BLUE CROSS/BLUE SHIELD | Admitting: Ophthalmology

## 2014-06-13 DIAGNOSIS — H43813 Vitreous degeneration, bilateral: Secondary | ICD-10-CM | POA: Diagnosis not present

## 2014-06-13 DIAGNOSIS — E10331 Type 1 diabetes mellitus with moderate nonproliferative diabetic retinopathy with macular edema: Secondary | ICD-10-CM | POA: Diagnosis not present

## 2014-06-13 DIAGNOSIS — E10311 Type 1 diabetes mellitus with unspecified diabetic retinopathy with macular edema: Secondary | ICD-10-CM | POA: Diagnosis not present

## 2014-07-18 ENCOUNTER — Encounter (INDEPENDENT_AMBULATORY_CARE_PROVIDER_SITE_OTHER): Payer: BLUE CROSS/BLUE SHIELD | Admitting: Ophthalmology

## 2014-07-18 DIAGNOSIS — E10331 Type 1 diabetes mellitus with moderate nonproliferative diabetic retinopathy with macular edema: Secondary | ICD-10-CM | POA: Diagnosis not present

## 2014-07-18 DIAGNOSIS — H43813 Vitreous degeneration, bilateral: Secondary | ICD-10-CM

## 2014-07-18 DIAGNOSIS — E10311 Type 1 diabetes mellitus with unspecified diabetic retinopathy with macular edema: Secondary | ICD-10-CM

## 2014-07-29 ENCOUNTER — Other Ambulatory Visit (INDEPENDENT_AMBULATORY_CARE_PROVIDER_SITE_OTHER): Payer: BLUE CROSS/BLUE SHIELD | Admitting: Ophthalmology

## 2014-07-29 DIAGNOSIS — E10311 Type 1 diabetes mellitus with unspecified diabetic retinopathy with macular edema: Secondary | ICD-10-CM | POA: Diagnosis not present

## 2014-07-29 DIAGNOSIS — E10331 Type 1 diabetes mellitus with moderate nonproliferative diabetic retinopathy with macular edema: Secondary | ICD-10-CM | POA: Diagnosis not present

## 2014-08-29 ENCOUNTER — Other Ambulatory Visit (INDEPENDENT_AMBULATORY_CARE_PROVIDER_SITE_OTHER): Payer: BLUE CROSS/BLUE SHIELD | Admitting: Ophthalmology

## 2014-08-29 DIAGNOSIS — E10311 Type 1 diabetes mellitus with unspecified diabetic retinopathy with macular edema: Secondary | ICD-10-CM | POA: Diagnosis not present

## 2014-08-29 DIAGNOSIS — E10351 Type 1 diabetes mellitus with proliferative diabetic retinopathy with macular edema: Secondary | ICD-10-CM

## 2014-10-30 ENCOUNTER — Encounter (INDEPENDENT_AMBULATORY_CARE_PROVIDER_SITE_OTHER): Payer: BLUE CROSS/BLUE SHIELD | Admitting: Ophthalmology

## 2014-11-15 ENCOUNTER — Encounter (INDEPENDENT_AMBULATORY_CARE_PROVIDER_SITE_OTHER): Payer: BLUE CROSS/BLUE SHIELD | Admitting: Ophthalmology

## 2014-11-29 ENCOUNTER — Encounter (INDEPENDENT_AMBULATORY_CARE_PROVIDER_SITE_OTHER): Payer: BLUE CROSS/BLUE SHIELD | Admitting: Ophthalmology

## 2014-12-13 ENCOUNTER — Encounter (INDEPENDENT_AMBULATORY_CARE_PROVIDER_SITE_OTHER): Payer: BLUE CROSS/BLUE SHIELD | Admitting: Ophthalmology

## 2014-12-13 DIAGNOSIS — E11311 Type 2 diabetes mellitus with unspecified diabetic retinopathy with macular edema: Secondary | ICD-10-CM

## 2014-12-13 DIAGNOSIS — E103592 Type 1 diabetes mellitus with proliferative diabetic retinopathy without macular edema, left eye: Secondary | ICD-10-CM

## 2014-12-13 DIAGNOSIS — E103552 Type 1 diabetes mellitus with stable proliferative diabetic retinopathy, left eye: Secondary | ICD-10-CM

## 2014-12-13 DIAGNOSIS — E103511 Type 1 diabetes mellitus with proliferative diabetic retinopathy with macular edema, right eye: Secondary | ICD-10-CM

## 2014-12-13 DIAGNOSIS — H43813 Vitreous degeneration, bilateral: Secondary | ICD-10-CM

## 2014-12-27 ENCOUNTER — Other Ambulatory Visit (INDEPENDENT_AMBULATORY_CARE_PROVIDER_SITE_OTHER): Payer: BLUE CROSS/BLUE SHIELD | Admitting: Ophthalmology

## 2015-01-24 ENCOUNTER — Other Ambulatory Visit (INDEPENDENT_AMBULATORY_CARE_PROVIDER_SITE_OTHER): Payer: BLUE CROSS/BLUE SHIELD | Admitting: Ophthalmology

## 2015-01-24 DIAGNOSIS — E113511 Type 2 diabetes mellitus with proliferative diabetic retinopathy with macular edema, right eye: Secondary | ICD-10-CM

## 2015-01-24 DIAGNOSIS — E10311 Type 1 diabetes mellitus with unspecified diabetic retinopathy with macular edema: Secondary | ICD-10-CM

## 2015-01-27 ENCOUNTER — Other Ambulatory Visit (INDEPENDENT_AMBULATORY_CARE_PROVIDER_SITE_OTHER): Payer: BLUE CROSS/BLUE SHIELD | Admitting: Ophthalmology

## 2015-01-27 DIAGNOSIS — E113511 Type 2 diabetes mellitus with proliferative diabetic retinopathy with macular edema, right eye: Secondary | ICD-10-CM | POA: Diagnosis not present

## 2015-01-27 DIAGNOSIS — E10311 Type 1 diabetes mellitus with unspecified diabetic retinopathy with macular edema: Secondary | ICD-10-CM | POA: Diagnosis not present

## 2015-02-04 ENCOUNTER — Encounter: Payer: BC Managed Care – PPO | Admitting: Women's Health

## 2015-02-20 ENCOUNTER — Encounter: Payer: BLUE CROSS/BLUE SHIELD | Admitting: Women's Health

## 2015-03-19 ENCOUNTER — Ambulatory Visit (INDEPENDENT_AMBULATORY_CARE_PROVIDER_SITE_OTHER): Payer: BLUE CROSS/BLUE SHIELD | Admitting: Women's Health

## 2015-03-19 ENCOUNTER — Encounter: Payer: Self-pay | Admitting: Women's Health

## 2015-03-19 ENCOUNTER — Other Ambulatory Visit (HOSPITAL_COMMUNITY)
Admission: RE | Admit: 2015-03-19 | Discharge: 2015-03-19 | Disposition: A | Discharge status: Home / Self Care | Payer: BLUE CROSS/BLUE SHIELD | Source: Ambulatory Visit | Attending: Women's Health | Admitting: Women's Health

## 2015-03-19 VITALS — BP 140/82 | Ht 63.0 in | Wt 218.0 lb

## 2015-03-19 DIAGNOSIS — Z01419 Encounter for gynecological examination (general) (routine) without abnormal findings: Secondary | ICD-10-CM

## 2015-03-19 DIAGNOSIS — Z1151 Encounter for screening for human papillomavirus (HPV): Secondary | ICD-10-CM | POA: Diagnosis not present

## 2015-03-19 DIAGNOSIS — Z113 Encounter for screening for infections with a predominantly sexual mode of transmission: Secondary | ICD-10-CM | POA: Diagnosis not present

## 2015-03-19 NOTE — Addendum Note (Signed)
Addended by: Kem ParkinsonBARNES, Jaquelin Meaney on: 03/19/2015 02:17 PM   Modules accepted: Orders

## 2015-03-19 NOTE — Progress Notes (Signed)
Theresa RileCourtney [name] 03/01/1991 [id number]    History:    Presents for annual exam.  Regular monthly cycle not sexually active, intercourse 1 in July with new partner with condom. Type 1 diabetes managed by Dr. Sharl MaKerr, had cataract surgery from the diabetes in March and has continued follow-up from that. Reports last hemoglobin A1c 6.9%.  Has chronic nerve pain, fibromyalgia, has had  80 pound weight gain in the past year from medications used to treat. Normal Pap history. Gardasil series completed.  Past medical history, past surgical history, family history and social history were all reviewed and documented in the EPIC chart. Recently [employer] in [employer]. Mother died this past year from complications from diabetes. Father history unknown. Has supportive grandmother and aunt.  ROS:  A ROS was performed and pertinent positives and negatives are included.  Exam:  Filed Vitals:   03/19/15 1120  BP: 140/82    General appearance:  Normal Thyroid:  Symmetrical, normal in size, without palpable masses or nodularity. Respiratory  Auscultation:  Clear without wheezing or rhonchi Cardiovascular  Auscultation:  Regular rate, without rubs, murmurs or gallops  Edema/varicosities:  Not grossly evident Abdominal  Soft,nontender, without masses, guarding or rebound.  Liver/spleen:  No organomegaly noted  Hernia:  None appreciated  Skin  Inspection:  Grossly normal   Breasts: Examined lying and sitting.     Right: Without masses, retractions, discharge or axillary adenopathy.     Left: Without masses, retractions, discharge or axillary adenopathy. Gentitourinary   Inguinal/mons:  Normal without inguinal adenopathy  External genitalia:  Normal  BUS/Urethra/Skene's glands:  Normal  Vagina:  Normal  Cervix:  Normal  Uterus:   normal in size, shape and contour.  Midline and mobile  Adnexa/parametria:     Rt: Without masses or tenderness.   Lt: Without masses or tenderness.  Anus and  perineum: Normal  Digital rectal exam: Normal sphincter tone without palpated masses or tenderness  Assessment/Plan:  25 y.o. SBF G0 for annual exam.    Monthly cycle/not sexually active Type 1 diabetes-Dr. Sharl MaKerr manages Chronic nerve pain/fibromyalgia-neurologist  Plan: Contraception options reviewed, IUDs reviewed, will call if decides would like placed - Dr. Audie BoxFontaine. Condoms encouraged if sexually active. SBE's, exercise, calcium rich diet, MVI daily encouraged. UA, Pap. GC/Chlamydia, denies need for HIV, hepatitis or RPR.    Harrington ChallengerYOUNG,[name] WHNP, 12:08 PM 03/19/2015

## 2015-03-19 NOTE — Patient Instructions (Signed)
Intrauterine Device Information An intrauterine device (IUD) is inserted into your uterus to prevent pregnancy. There are two types of IUDs available:   Copper IUD--This type of IUD is wrapped in copper wire and is placed inside the uterus. Copper makes the uterus and fallopian tubes produce a fluid that kills sperm. The copper IUD can stay in place for 10 years.  Hormone IUD--This type of IUD contains the hormone progestin (synthetic progesterone). The hormone thickens the cervical mucus and prevents sperm from entering the uterus. It also thins the uterine lining to prevent implantation of a fertilized egg. The hormone can weaken or kill the sperm that get into the uterus. One type of hormone IUD can stay in place for 5 years, and another type can stay in place for 3 years. Your health care provider will make sure you are a good candidate for a contraceptive IUD. Discuss with your health care provider the possible side effects.  ADVANTAGES OF AN INTRAUTERINE DEVICE  IUDs are highly effective, reversible, long acting, and low maintenance.   There are no estrogen-related side effects.   An IUD can be used when breastfeeding.   IUDs are not associated with weight gain.   The copper IUD works immediately after insertion.   The hormone IUD works right away if inserted within 7 days of your period starting. You will need to use a backup method of birth control for 7 days if the hormone IUD is inserted at any other time in your cycle.  The copper IUD does not interfere with your female hormones.   The hormone IUD can make heavy menstrual periods lighter and decrease cramping.   The hormone IUD can be used for 3 or 5 years.   The copper IUD can be used for 10 years. DISADVANTAGES OF AN INTRAUTERINE DEVICE  The hormone IUD can be associated with irregular bleeding patterns.   The copper IUD can make your menstrual flow heavier and more painful.   You may experience cramping and  vaginal bleeding after insertion.    This information is not intended to replace advice given to you by your health care provider. Make sure you discuss any questions you have with your health care provider.   Document Released: 01/27/2004 Document Revised: 10/25/2012 Document Reviewed: 08/13/2012 Elsevier Interactive Patient Education 2016 [place] Maintenance, Female Adopting a healthy lifestyle and getting preventive care can go a long way to promote health and wellness. Talk with your health care provider about what schedule of regular examinations is right for you. This is a good chance for you to check in with your provider about disease prevention and staying healthy. In between checkups, there are plenty of things you can do on your own. Experts have done a lot of research about which lifestyle changes and preventive measures are most likely to keep you healthy. Ask your health care provider for more information. WEIGHT AND DIET  Eat a healthy diet  Be sure to include plenty of vegetables, fruits, low-fat dairy products, and lean protein.  Do not eat a lot of foods high in solid fats, added sugars, or salt.  Get regular exercise. This is one of the most important things you can do for your health.  Most adults should exercise for at least 150 minutes each week. The exercise should increase your heart rate and make you sweat (moderate-intensity exercise).  Most adults should also do strengthening exercises at least twice a week. This is in addition to the moderate-intensity  exercise.  Maintain a healthy weight  Body mass index (BMI) is a measurement that can be used to identify possible weight problems. It estimates body fat based on height and weight. Your health care provider can help determine your BMI and help you achieve or maintain a healthy weight.  For females 22 years of age and older:   A BMI below 18.5 is considered underweight.  A BMI of 18.5 to 24.9 is  normal.  A BMI of 25 to 29.9 is considered overweight.  A BMI of 30 and above is considered obese.  Watch levels of cholesterol and blood lipids  You should start having your blood tested for lipids and cholesterol at 26 years of age, then have this test every 5 years.  You may need to have your cholesterol levels checked more often if:  Your lipid or cholesterol levels are high.  You are older than 26 years of age.  You are at high risk for heart disease.  CANCER SCREENING   Lung Cancer  Lung cancer screening is recommended for adults 25-93 years old who are at high risk for lung cancer because of a history of smoking.  A yearly low-dose CT scan of the lungs is recommended for people who:  Currently smoke.  Have quit within the past 15 years.  Have at least a 30-pack-year history of smoking. A pack year is smoking an average of one pack of cigarettes a day for 1 year.  Yearly screening should continue until it has been 15 years since you quit.  Yearly screening should stop if you develop a health problem that would prevent you from having lung cancer treatment.  Breast Cancer  Practice breast self-awareness. This means understanding how your breasts normally appear and feel.  It also means doing regular breast self-exams. Let your health care provider know about any changes, no matter how small.  If you are in your 20s or 30s, you should have a clinical breast exam (CBE) by a health care provider every 1-3 years as part of a regular health exam.  If you are 48 or older, have a CBE every year. Also consider having a breast X-ray (mammogram) every year.  If you have a family history of breast cancer, talk to your health care provider about genetic screening.  If you are at high risk for breast cancer, talk to your health care provider about having an MRI and a mammogram every year.  Breast cancer gene (BRCA) assessment is recommended for women who have family members  with BRCA-related cancers. BRCA-related cancers include:  Breast.  Ovarian.  Tubal.  Peritoneal cancers.  Results of the assessment will determine the need for genetic counseling and BRCA1 and BRCA2 testing. Cervical Cancer Your health care provider may recommend that you be screened regularly for cancer of the pelvic organs (ovaries, uterus, and vagina). This screening involves a pelvic examination, including checking for microscopic changes to the surface of your cervix (Pap test). You may be encouraged to have this screening done every 3 years, beginning at age 7.  For women ages 41-65, health care providers may recommend pelvic exams and Pap testing every 3 years, or they may recommend the Pap and pelvic exam, combined with testing for human papilloma virus (HPV), every 5 years. Some types of HPV increase your risk of cervical cancer. Testing for HPV may also be done on women of any age with unclear Pap test results.  Other health care providers may not recommend any  screening for nonpregnant women who are considered low risk for pelvic cancer and who do not have symptoms. Ask your health care provider if a screening pelvic exam is right for you.  If you have had past treatment for cervical cancer or a condition that could lead to cancer, you need Pap tests and screening for cancer for at least 20 years after your treatment. If Pap tests have been discontinued, your risk factors (such as having a new sexual partner) need to be reassessed to determine if screening should resume. Some women have medical problems that increase the chance of getting cervical cancer. In these cases, your health care provider may recommend more frequent screening and Pap tests. Colorectal Cancer  This type of cancer can be detected and often prevented.  Routine colorectal cancer screening usually begins at 26 years of age and continues through 26 years of age.  Your health care provider may recommend  screening at an earlier age if you have risk factors for colon cancer.  Your health care provider may also recommend using home test kits to check for hidden blood in the stool.  A small camera at the end of a tube can be used to examine your colon directly (sigmoidoscopy or colonoscopy). This is done to check for the earliest forms of colorectal cancer.  Routine screening usually begins at age 53.  Direct examination of the colon should be repeated every 5-10 years through 26 years of age. However, you may need to be screened more often if early forms of precancerous polyps or small growths are found. Skin Cancer  Check your skin from head to toe regularly.  Tell your health care provider about any new moles or changes in moles, especially if there is a change in a mole's shape or color.  Also tell your health care provider if you have a mole that is larger than the size of a pencil eraser.  Always use sunscreen. Apply sunscreen liberally and repeatedly throughout the day.  Protect yourself by wearing long sleeves, pants, a wide-brimmed hat, and sunglasses whenever you are outside. HEART DISEASE, DIABETES, AND HIGH BLOOD PRESSURE   High blood pressure causes heart disease and increases the risk of stroke. High blood pressure is more likely to develop in:  People who have blood pressure in the high end of the normal range (130-139/85-89 mm Hg).  People who are overweight or obese.  People who are African American.  If you are 80-75 years of age, have your blood pressure checked every 3-5 years. If you are 53 years of age or older, have your blood pressure checked every year. You should have your blood pressure measured twice--once when you are at a hospital or clinic, and once when you are not at a hospital or clinic. Record the average of the two measurements. To check your blood pressure when you are not at a hospital or clinic, you can use:  An automated blood pressure machine at a  pharmacy.  A home blood pressure monitor.  If you are between 71 years and 79 years old, ask your health care provider if you should take aspirin to prevent strokes.  Have regular diabetes screenings. This involves taking a blood sample to check your fasting blood sugar level.  If you are at a normal weight and have a low risk for diabetes, have this test once every three years after 26 years of age.  If you are overweight and have a high risk for diabetes, consider  being tested at a younger age or more often. PREVENTING INFECTION  Hepatitis B  If you have a higher risk for hepatitis B, you should be screened for this virus. You are considered at high risk for hepatitis B if:  You were born in a country where hepatitis B is common. Ask your health care provider which countries are considered high risk.  Your parents were born in a high-risk country, and you have not been immunized against hepatitis B (hepatitis B vaccine).  You have HIV or AIDS.  You use needles to inject street drugs.  You live with someone who has hepatitis B.  You have had sex with someone who has hepatitis B.  You get hemodialysis treatment.  You take certain medicines for conditions, including cancer, organ transplantation, and autoimmune conditions. Hepatitis C  Blood testing is recommended for:  Everyone born from [birth year] through 1966.  Anyone with known risk factors for hepatitis C. Sexually transmitted infections (STIs)  You should be screened for sexually transmitted infections (STIs) including gonorrhea and chlamydia if:  You are sexually active and are younger than 26 years of age.  You are older than 26 years of age and your health care provider tells you that you are at risk for this type of infection.  Your sexual activity has changed since you were last screened and you are at an increased risk for chlamydia or gonorrhea. Ask your health care provider if you are at risk.  If you do not  have HIV, but are at risk, it may be recommended that you take a prescription medicine daily to prevent HIV infection. This is called pre-exposure prophylaxis (PrEP). You are considered at risk if:  You are sexually active and do not regularly use condoms or know the HIV status of your partner(s).  You take drugs by injection.  You are sexually active with a partner who has HIV. Talk with your health care provider about whether you are at high risk of being infected with HIV. If you choose to begin PrEP, you should first be tested for HIV. You should then be tested every 3 months for as long as you are taking PrEP.  PREGNANCY   If you are premenopausal and you may become pregnant, ask your health care provider about preconception counseling.  If you may become pregnant, take 400 to 800 micrograms (mcg) of folic acid every day.  If you want to prevent pregnancy, talk to your health care provider about birth control (contraception). OSTEOPOROSIS AND MENOPAUSE   Osteoporosis is a disease in which the bones lose minerals and strength with aging. This can result in serious bone fractures. Your risk for osteoporosis can be identified using a bone density scan.  If you are 63 years of age or older, or if you are at risk for osteoporosis and fractures, ask your health care provider if you should be screened.  Ask your health care provider whether you should take a calcium or vitamin D supplement to lower your risk for osteoporosis.  Menopause may have certain physical symptoms and risks.  Hormone replacement therapy may reduce some of these symptoms and risks. Talk to your health care provider about whether hormone replacement therapy is right for you.  HOME CARE INSTRUCTIONS   Schedule regular health, dental, and eye exams.  Stay current with your immunizations.   Do not use any tobacco products including cigarettes, chewing tobacco, or electronic cigarettes.  If you are pregnant, do not  drink alcohol.  If you are breastfeeding, limit how much and how often you drink alcohol.  Limit alcohol intake to no more than 1 drink per day for nonpregnant women. One drink equals 12 ounces of beer, 5 ounces of wine, or 1 ounces of hard liquor.  Do not use street drugs.  Do not share needles.  Ask your health care provider for help if you need support or information about quitting drugs.  Tell your health care provider if you often feel depressed.  Tell your health care provider if you have ever been abused or do not feel safe at home.   This information is not intended to replace advice given to you by your health care provider. Make sure you discuss any questions you have with your health care provider.   Document Released: 09/07/2010 Document Revised: 03/15/2014 Document Reviewed: 01/24/2013 Elsevier Interactive Patient Education [employer].

## 2015-03-20 ENCOUNTER — Encounter: Payer: Self-pay | Admitting: Women's Health

## 2015-03-20 LAB — URINALYSIS W MICROSCOPIC + REFLEX CULTURE
Bacteria, UA: NONE SEEN [HPF]
Bilirubin Urine: NEGATIVE
Casts: NONE SEEN [LPF]
Crystals: NONE SEEN [HPF]
Hgb urine dipstick: NEGATIVE
Ketones, ur: NEGATIVE
Leukocytes, UA: NEGATIVE
Nitrite: NEGATIVE
Protein, ur: NEGATIVE
Specific Gravity, Urine: 1.023 (ref 1.001–1.035)
Squamous Epithelial / LPF: NONE SEEN [HPF] (ref <=5)
WBC, UA: NONE SEEN WBC/HPF (ref <=5)
Yeast: NONE SEEN [HPF]
pH: 6.5 (ref 5.0–8.0)

## 2015-03-20 LAB — GC/CHLAMYDIA PROBE AMP
CT Probe RNA: NOT DETECTED
GC Probe RNA: NOT DETECTED

## 2015-03-21 LAB — CYTOLOGY - PAP

## 2015-03-21 LAB — URINE CULTURE: Colony Count: 8000

## 2015-04-04 ENCOUNTER — Encounter (INDEPENDENT_AMBULATORY_CARE_PROVIDER_SITE_OTHER): Payer: BLUE CROSS/BLUE SHIELD | Admitting: Ophthalmology

## 2015-04-17 ENCOUNTER — Telehealth: Payer: Self-pay | Admitting: Gynecology

## 2015-04-17 NOTE — Telephone Encounter (Signed)
04/17/15-I LM VM for pt that her [facility] will cover the Nexplanon for contraception at 100%, no copay. I told her TF would be the doctor to do this and that it needs to be inserted while she is on her cycle.wl   Per [id number]

## 2015-04-30 ENCOUNTER — Ambulatory Visit: Payer: BLUE CROSS/BLUE SHIELD | Admitting: *Deleted

## 2015-05-07 ENCOUNTER — Encounter: Payer: Self-pay | Admitting: Gynecology

## 2015-05-07 ENCOUNTER — Ambulatory Visit (INDEPENDENT_AMBULATORY_CARE_PROVIDER_SITE_OTHER): Payer: BLUE CROSS/BLUE SHIELD | Admitting: Gynecology

## 2015-05-07 VITALS — BP 130/76

## 2015-05-07 DIAGNOSIS — Z30017 Encounter for initial prescription of implantable subdermal contraceptive: Secondary | ICD-10-CM | POA: Diagnosis not present

## 2015-05-07 NOTE — Progress Notes (Signed)
Patient presents for Nexplanon insertion. She previously has been counseled for her contraceptive options and she elects for nexplanon. I again personally reviewed all options with her to include pill, patch, ring, Depo-Provera, Nexplanon, IUDs. Possible decreased efficacy with increased weight also reviewed.  I reviewed the Nexplanon insertional process and the side effects/risks. I reviewed irregular bleeding, insertion site infections, underlying neurovascular damage with permanent sequela, migration of the implant making removal difficult requiring surgery, the need to have it removed in 3 years under a separate procedure and lastly the risk of failure with pregnancy.  Patient is currently on a normal menses and she is left-handed. She has read through and signed the consent form.  Procedure with Kennon Portela assistant: Right upper arm examined and marked according to manufacturer's recommendation. The insertion site was cleansed with Betadine solution and the insertional tract infiltrated with 1% lidocaine. The Nexplanon was placed according to manufacturer's recommendation without difficulty. The skin defect was closed with a Steri-Strip. The patient palpated the rod. A pressure dressing was applied and postoperative instructions give her.   Lot number:  [id number]     Dara Lords MD, 2:38 PM 05/07/2015

## 2015-05-07 NOTE — Patient Instructions (Signed)
Etonogestrel implant What is this medicine? ETONOGESTREL (et oh noe JES trel) is a contraceptive (birth control) device. It is used to prevent pregnancy. It can be used for up to 3 years. This medicine may be used for other purposes; ask your health care provider or pharmacist if you have questions. What should I tell my health care provider before I take this medicine? They need to know if you have any of these conditions: -abnormal vaginal bleeding -blood vessel disease or blood clots -cancer of the breast, cervix, or liver -depression -diabetes -gallbladder disease -headaches -heart disease or recent heart attack -high blood pressure -high cholesterol -kidney disease -liver disease -renal disease -seizures -tobacco smoker -an unusual or allergic reaction to etonogestrel, other hormones, anesthetics or antiseptics, medicines, foods, dyes, or preservatives -pregnant or trying to get pregnant -breast-feeding How should I use this medicine? This device is inserted just under the skin on the inner side of your upper arm by a health care professional. Talk to your pediatrician regarding the use of this medicine in children. Special care may be needed. Overdosage: If you think you have taken too much of this medicine contact a poison control center or emergency room at once. NOTE: This medicine is only for you. Do not share this medicine with others. What if I miss a dose? This does not apply. What may interact with this medicine? Do not take this medicine with any of the following medications: -amprenavir -bosentan -fosamprenavir This medicine may also interact with the following medications: -barbiturate medicines for inducing sleep or treating seizures -certain medicines for fungal infections like ketoconazole and itraconazole -griseofulvin -medicines to treat seizures like carbamazepine, felbamate, oxcarbazepine, phenytoin,  topiramate -modafinil -phenylbutazone -rifampin -some medicines to treat HIV infection like atazanavir, indinavir, lopinavir, nelfinavir, tipranavir, ritonavir -St. John's wort This list may not describe all possible interactions. Give your health care provider a list of all the medicines, herbs, non-prescription drugs, or dietary supplements you use. Also tell them if you smoke, drink alcohol, or use illegal drugs. Some items may interact with your medicine. What should I watch for while using this medicine? This product does not protect you against HIV infection (AIDS) or other sexually transmitted diseases. You should be able to feel the implant by pressing your fingertips over the skin where it was inserted. Contact your doctor if you cannot feel the implant, and use a non-hormonal birth control method (such as condoms) until your doctor confirms that the implant is in place. If you feel that the implant may have broken or become bent while in your arm, contact your healthcare provider. What side effects may I notice from receiving this medicine? Side effects that you should report to your doctor or health care professional as soon as possible: -allergic reactions like skin rash, itching or hives, swelling of the face, lips, or tongue -breast lumps -changes in emotions or moods -depressed mood -heavy or prolonged menstrual bleeding -pain, irritation, swelling, or bruising at the insertion site -scar at site of insertion -signs of infection at the insertion site such as fever, and skin redness, pain or discharge -signs of pregnancy -signs and symptoms of a blood clot such as breathing problems; changes in vision; chest pain; severe, sudden headache; pain, swelling, warmth in the leg; trouble speaking; sudden numbness or weakness of the face, arm or leg -signs and symptoms of liver injury like dark yellow or brown urine; general ill feeling or flu-like symptoms; light-colored stools; loss of  appetite; nausea; right upper belly   pain; unusually weak or tired; yellowing of the eyes or skin -unusual vaginal bleeding, discharge -signs and symptoms of a stroke like changes in vision; confusion; trouble speaking or understanding; severe headaches; sudden numbness or weakness of the face, arm or leg; trouble walking; dizziness; loss of balance or coordination Side effects that usually do not require medical attention (Report these to your doctor or health care professional if they continue or are bothersome.): -acne -back pain -breast pain -changes in weight -dizziness -general ill feeling or flu-like symptoms -headache -irregular menstrual bleeding -nausea -sore throat -vaginal irritation or inflammation This list may not describe all possible side effects. Call your doctor for medical advice about side effects. You may report side effects to FDA at 1-800-FDA-1088. Where should I keep my medicine? This drug is given in a hospital or clinic and will not be stored at home. NOTE: This sheet is a summary. It may not cover all possible information. If you have questions about this medicine, talk to your doctor, pharmacist, or health care provider.    2016, Elsevier/Gold Standard. (2013-12-07 14:07:06)  

## 2015-05-29 ENCOUNTER — Encounter (INDEPENDENT_AMBULATORY_CARE_PROVIDER_SITE_OTHER): Payer: BLUE CROSS/BLUE SHIELD | Admitting: Ophthalmology

## 2015-05-29 DIAGNOSIS — H43813 Vitreous degeneration, bilateral: Secondary | ICD-10-CM | POA: Diagnosis not present

## 2015-05-29 DIAGNOSIS — E10319 Type 1 diabetes mellitus with unspecified diabetic retinopathy without macular edema: Secondary | ICD-10-CM | POA: Diagnosis not present

## 2015-05-29 DIAGNOSIS — E103593 Type 1 diabetes mellitus with proliferative diabetic retinopathy without macular edema, bilateral: Secondary | ICD-10-CM | POA: Diagnosis not present

## 2015-06-24 ENCOUNTER — Telehealth: Payer: Self-pay | Admitting: *Deleted

## 2015-06-24 MED ORDER — MEGESTROL ACETATE 20 MG PO TABS: 20.0000 mg | ORAL_TABLET | Freq: Every day | ORAL | Status: DC

## 2015-06-24 NOTE — Telephone Encounter (Signed)
Pt had Nexplanon inserted on 05/07/15, c/o bleeding for 3 weeks now, pt said bleeding is not heavy, medium period flow, constant flow. I told pt could be related to nexplanon, but I will inform you of this as well for your recommendation.

## 2015-06-24 NOTE — Telephone Encounter (Signed)
Pt informed with the below note, Rx sent. 

## 2015-06-24 NOTE — Telephone Encounter (Signed)
Recommend Megace 20 mg daily 14 days

## 2015-06-26 ENCOUNTER — Ambulatory Visit (INDEPENDENT_AMBULATORY_CARE_PROVIDER_SITE_OTHER): Payer: BLUE CROSS/BLUE SHIELD | Admitting: Women's Health

## 2015-06-26 ENCOUNTER — Encounter: Payer: Self-pay | Admitting: Women's Health

## 2015-06-26 VITALS — BP 126/80 | Ht 63.0 in | Wt 218.0 lb

## 2015-06-26 DIAGNOSIS — N926 Irregular menstruation, unspecified: Secondary | ICD-10-CM

## 2015-06-26 NOTE — Progress Notes (Signed)
Patient ID: Theresa RileCourtney [name], female   DOB: 09/12/1990, 25 y.o.   MRN: [id number] Presents with complaint of prolonged menses.  Cycle started on April 1, flow goes from light to heavy, has had no days without bleeding since April 1. Nexplanon placed 05/07/2015 on cycle. New partner had a negative STD screen that she saw paperwork confirming. Has used condoms inconsistently. Type 1 diabetes reports last hemoglobin A1c was 6.9, has had hemoglobin A1cs in the teens in the past. Denies abdominal pain, discharge, vaginal odor, fever or itching. Was given a prescription for Megace but did not take,  was fearful of blood sugar elevations.  Exam: Appears well, overweight. External genitalia within normal limits, speculum exam scant menses type blood present GC/Chlamydia culture taken. Bimanual no CMT or adnexal tenderness. Nexplanon palpated in upper right inner arm.  Irregular bleeding on Nexplanon Type 1 diabetes  Plan: Megace 20 mg twice daily until bleeding stops and then daily for 1 week. Will continue to monitor blood sugars, reviewed megace will only be taken for short-term, best not to take as a long-term medication. Instructed to call if continued problems or if she does notice blood sugars to elevate. Encouraged to continue condoms until permanent partner.

## 2015-06-26 NOTE — Patient Instructions (Signed)
Etonogestrel implant What is this medicine? ETONOGESTREL (et oh noe JES trel) is a contraceptive (birth control) device. It is used to prevent pregnancy. It can be used for up to 3 years. This medicine may be used for other purposes; ask your health care provider or pharmacist if you have questions. What should I tell my health care provider before I take this medicine? They need to know if you have any of these conditions: -abnormal vaginal bleeding -blood vessel disease or blood clots -cancer of the breast, cervix, or liver -depression -diabetes -gallbladder disease -headaches -heart disease or recent heart attack -high blood pressure -high cholesterol -kidney disease -liver disease -renal disease -seizures -tobacco smoker -an unusual or allergic reaction to etonogestrel, other hormones, anesthetics or antiseptics, medicines, foods, dyes, or preservatives -pregnant or trying to get pregnant -breast-feeding How should I use this medicine? This device is inserted just under the skin on the inner side of your upper arm by a health care professional. Talk to your pediatrician regarding the use of this medicine in children. Special care may be needed. Overdosage: If you think you have taken too much of this medicine contact a poison control center or emergency room at once. NOTE: This medicine is only for you. Do not share this medicine with others. What if I miss a dose? This does not apply. What may interact with this medicine? Do not take this medicine with any of the following medications: -amprenavir -bosentan -fosamprenavir This medicine may also interact with the following medications: -barbiturate medicines for inducing sleep or treating seizures -certain medicines for fungal infections like ketoconazole and itraconazole -griseofulvin -medicines to treat seizures like carbamazepine, felbamate, oxcarbazepine, phenytoin,  topiramate -modafinil -phenylbutazone -rifampin -some medicines to treat HIV infection like atazanavir, indinavir, lopinavir, nelfinavir, tipranavir, ritonavir -St. John's wort This list may not describe all possible interactions. Give your health care provider a list of all the medicines, herbs, non-prescription drugs, or dietary supplements you use. Also tell them if you smoke, drink alcohol, or use illegal drugs. Some items may interact with your medicine. What should I watch for while using this medicine? This product does not protect you against HIV infection (AIDS) or other sexually transmitted diseases. You should be able to feel the implant by pressing your fingertips over the skin where it was inserted. Contact your doctor if you cannot feel the implant, and use a non-hormonal birth control method (such as condoms) until your doctor confirms that the implant is in place. If you feel that the implant may have broken or become bent while in your arm, contact your healthcare provider. What side effects may I notice from receiving this medicine? Side effects that you should report to your doctor or health care professional as soon as possible: -allergic reactions like skin rash, itching or hives, swelling of the face, lips, or tongue -breast lumps -changes in emotions or moods -depressed mood -heavy or prolonged menstrual bleeding -pain, irritation, swelling, or bruising at the insertion site -scar at site of insertion -signs of infection at the insertion site such as fever, and skin redness, pain or discharge -signs of pregnancy -signs and symptoms of a blood clot such as breathing problems; changes in vision; chest pain; severe, sudden headache; pain, swelling, warmth in the leg; trouble speaking; sudden numbness or weakness of the face, arm or leg -signs and symptoms of liver injury like dark yellow or brown urine; general ill feeling or flu-like symptoms; light-colored stools; loss of  appetite; nausea; right upper belly   pain; unusually weak or tired; yellowing of the eyes or skin -unusual vaginal bleeding, discharge -signs and symptoms of a stroke like changes in vision; confusion; trouble speaking or understanding; severe headaches; sudden numbness or weakness of the face, arm or leg; trouble walking; dizziness; loss of balance or coordination Side effects that usually do not require medical attention (Report these to your doctor or health care professional if they continue or are bothersome.): -acne -back pain -breast pain -changes in weight -dizziness -general ill feeling or flu-like symptoms -headache -irregular menstrual bleeding -nausea -sore throat -vaginal irritation or inflammation This list may not describe all possible side effects. Call your doctor for medical advice about side effects. You may report side effects to FDA at 1-800-FDA-1088. Where should I keep my medicine? This drug is given in a hospital or clinic and will not be stored at home. NOTE: This sheet is a summary. It may not cover all possible information. If you have questions about this medicine, talk to your doctor, pharmacist, or health care provider.    2016, Elsevier/Gold Standard. (2013-12-07 14:07:06)  

## 2015-06-27 LAB — GC/CHLAMYDIA PROBE AMP
CT Probe RNA: NOT DETECTED
GC Probe RNA: NOT DETECTED

## 2015-07-28 DIAGNOSIS — E1044 Type 1 diabetes mellitus with diabetic amyotrophy: Secondary | ICD-10-CM | POA: Insufficient documentation

## 2015-07-28 DIAGNOSIS — G609 Hereditary and idiopathic neuropathy, unspecified: Secondary | ICD-10-CM | POA: Insufficient documentation

## 2015-09-06 HISTORY — PX: WISDOM TOOTH EXTRACTION: SHX21

## 2015-11-03 ENCOUNTER — Encounter (HOSPITAL_COMMUNITY): Payer: Self-pay | Admitting: *Deleted

## 2015-11-03 ENCOUNTER — Encounter (INDEPENDENT_AMBULATORY_CARE_PROVIDER_SITE_OTHER): Payer: BLUE CROSS/BLUE SHIELD | Admitting: Ophthalmology

## 2015-11-03 DIAGNOSIS — H4311 Vitreous hemorrhage, right eye: Secondary | ICD-10-CM

## 2015-11-03 DIAGNOSIS — E103591 Type 1 diabetes mellitus with proliferative diabetic retinopathy without macular edema, right eye: Secondary | ICD-10-CM

## 2015-11-03 DIAGNOSIS — E10311 Type 1 diabetes mellitus with unspecified diabetic retinopathy with macular edema: Secondary | ICD-10-CM | POA: Diagnosis not present

## 2015-11-03 DIAGNOSIS — E103512 Type 1 diabetes mellitus with proliferative diabetic retinopathy with macular edema, left eye: Secondary | ICD-10-CM

## 2015-11-03 DIAGNOSIS — H43813 Vitreous degeneration, bilateral: Secondary | ICD-10-CM | POA: Diagnosis not present

## 2015-11-03 MED ORDER — CEFAZOLIN SODIUM-DEXTROSE 2-4 GM/100ML-% IV SOLN
2.0000 g | INTRAVENOUS | Status: AC
  Administered 2015-11-04: 2 g via INTRAVENOUS
  Filled 2015-11-03: qty 100

## 2015-11-03 NOTE — Progress Notes (Signed)
Pt denies SOB, chest pain, and being under the care of a cardiologist. Pt denies having a stress test , echo and cardiac cath. Pt denies having an EKG and chest x ray within the last year. Pt denies having any labs within the last 2 weeks. Pt made aware of diabetes protocol to check BS every 2 hours prior to arrival, interventions for a BS < 70 and > 220. Pt given medication and insulin pre-op instructions by MD. Pt made aware to stop Aspirin and NSAID's by MD; pt made aware to stop vitamins, herbal medications and fish oil by RN . Pt verbalized understanding of all pre-op instructions.

## 2015-11-03 NOTE — H&P (Signed)
Theresa [name] is an 25 y.o. female.   Chief Complaint:sudden vision loss right eye HPI: one week ago suffered sudden vision loss right eye  Past Medical History:  Diagnosis Date  . Insomnia   . Insulin dependent type 1 diabetes mellitus (HCC)   . Peripheral neuropathy (HCC)   . Seasonal allergies     Past Surgical History:  Procedure Laterality Date  . CERVICAL SPINE SURGERY    . EYE SURGERY  05/2014  . MASS EXCISION Right 07/10/2013   Procedure: MINOR EXCISION RIGHT NECK CYST;  Surgeon: Drema Halonhristopher E Newman, MD;  Location: [facility];  Service: ENT;  Laterality: Right;  . Nexplanon  05/07/2015    Family History  Problem Relation Age of Onset  . Diabetes Mother   . Migraines Mother   . Breast cancer Maternal Grandmother   . Thyroid disease Maternal Grandmother   . Hypertension Mother   . CVA Mother   . Cancer Maternal Grandfather    Social History:  reports that she has never smoked. She does not have any smokeless tobacco history on file. She reports that she does not drink alcohol or use drugs.  Allergies:  Allergies  Allergen Reactions  . Aloe Hives  . Duloxetine Other (See Comments)    RESTLESSNESS URGES TO MOVE  . Gabapentin Swelling    SWELLING REACTION OF FEET  . Olive Oil Hives  . Lyrica [Pregabalin] Other (See Comments)    UNSPECIFIED   . Tapentadol Other (See Comments)    UNSPECIFIED   . Zonisamide Other (See Comments)    WORSENING PAIN    No prescriptions prior to admission.    Review of systems otherwise negative  Weight 218 lb 0.6 oz (98.9 kg).  Physical exam: Mental status: oriented x3. Eyes: See eye exam associated with this date of surgery in media tab.  Scanned in by scanning center Ears, Nose, Throat: within normal limits Neck: Within Normal limits General: within normal limits Chest: Within normal limits Breast: deferred Heart: Within normal limits Abdomen: Within normal limits GU: deferred Extremities: within  normal limits Skin: within normal limits  Assessment/Plan Vitreous Hemorrhage, traction retinal detachment right eye [facility] Pars plana vitrectomy, laser, membrane peel, gas injection right eye  MATTHEWS, JOHN D 11/03/2015, 11:45 AM

## 2015-11-04 ENCOUNTER — Ambulatory Visit (HOSPITAL_COMMUNITY)
Admission: RE | Admit: 2015-11-04 | Discharge: 2015-11-05 | Disposition: A | Discharge status: Home / Self Care | Payer: BLUE CROSS/BLUE SHIELD | Source: Ambulatory Visit | Attending: Ophthalmology | Admitting: Ophthalmology

## 2015-11-04 ENCOUNTER — Ambulatory Visit (HOSPITAL_COMMUNITY): Payer: BLUE CROSS/BLUE SHIELD | Admitting: Certified Registered Nurse Anesthetist

## 2015-11-04 ENCOUNTER — Encounter (HOSPITAL_COMMUNITY)
Admission: RE | Disposition: A | Discharge status: Home / Self Care | Payer: Self-pay | Source: Ambulatory Visit | Attending: Ophthalmology

## 2015-11-04 ENCOUNTER — Encounter (HOSPITAL_COMMUNITY): Payer: Self-pay | Admitting: Certified Registered Nurse Anesthetist

## 2015-11-04 DIAGNOSIS — H3341 Traction detachment of retina, right eye: Secondary | ICD-10-CM | POA: Diagnosis present

## 2015-11-04 DIAGNOSIS — E103512 Type 1 diabetes mellitus with proliferative diabetic retinopathy with macular edema, left eye: Secondary | ICD-10-CM | POA: Diagnosis not present

## 2015-11-04 DIAGNOSIS — E113591 Type 2 diabetes mellitus with proliferative diabetic retinopathy without macular edema, right eye: Secondary | ICD-10-CM | POA: Diagnosis present

## 2015-11-04 DIAGNOSIS — Z794 Long term (current) use of insulin: Secondary | ICD-10-CM | POA: Insufficient documentation

## 2015-11-04 DIAGNOSIS — E1142 Type 2 diabetes mellitus with diabetic polyneuropathy: Secondary | ICD-10-CM | POA: Insufficient documentation

## 2015-11-04 DIAGNOSIS — H4311 Vitreous hemorrhage, right eye: Secondary | ICD-10-CM | POA: Insufficient documentation

## 2015-11-04 DIAGNOSIS — E119 Type 2 diabetes mellitus without complications: Secondary | ICD-10-CM | POA: Diagnosis present

## 2015-11-04 DIAGNOSIS — Z833 Family history of diabetes mellitus: Secondary | ICD-10-CM | POA: Diagnosis not present

## 2015-11-04 DIAGNOSIS — E113593 Type 2 diabetes mellitus with proliferative diabetic retinopathy without macular edema, bilateral: Secondary | ICD-10-CM | POA: Diagnosis present

## 2015-11-04 DIAGNOSIS — E10311 Type 1 diabetes mellitus with unspecified diabetic retinopathy with macular edema: Secondary | ICD-10-CM | POA: Diagnosis not present

## 2015-11-04 HISTORY — PX: PARS PLANA VITRECTOMY: SHX2166

## 2015-11-04 HISTORY — DX: Headache: R51

## 2015-11-04 HISTORY — PX: PARS PLANA REPAIR OF RETINAL DEATACHMENT: SHX2165

## 2015-11-04 HISTORY — DX: Fibromyalgia: M79.7

## 2015-11-04 HISTORY — DX: Traction detachment of retina, unspecified eye: H33.40

## 2015-11-04 HISTORY — DX: Headache, unspecified: R51.9

## 2015-11-04 LAB — BASIC METABOLIC PANEL
Anion gap: 6 (ref 5–15)
BUN: 6 mg/dL (ref 6–20)
CO2: 25 mmol/L (ref 22–32)
Calcium: 9.3 mg/dL (ref 8.9–10.3)
Chloride: 106 mmol/L (ref 101–111)
Creatinine, Ser: 0.7 mg/dL (ref 0.44–1.00)
GFR calc Af Amer: >60 mL/min (ref >60)
GFR calc non Af Amer: >60 mL/min (ref >60)
Glucose, Bld: 177 mg/dL — ABNORMAL HIGH (ref 65–99)
Potassium: 4.4 mmol/L (ref 3.5–5.1)
Sodium: 137 mmol/L (ref 135–145)

## 2015-11-04 LAB — GLUCOSE, CAPILLARY
Glucose-Capillary: 186 mg/dL — ABNORMAL HIGH (ref 65–99)
Glucose-Capillary: 117 mg/dL — ABNORMAL HIGH (ref 65–99)
Glucose-Capillary: 121 mg/dL — ABNORMAL HIGH (ref 65–99)
Glucose-Capillary: 219 mg/dL — ABNORMAL HIGH (ref 65–99)

## 2015-11-04 LAB — HCG, SERUM, QUALITATIVE: Preg, Serum: NEGATIVE

## 2015-11-04 IMAGING — US US ABDOMEN COMPLETE
1 series · 14 of 25 positions shown · non-contrast
Comparison: None.

CLINICAL DATA: Abdominal distention.

EXAM:
ULTRASOUND ABDOMEN COMPLETE

[Series 1: us abdomen complete · 0.17mm/px · 14 of 74 slices shown]
[im 1/74]
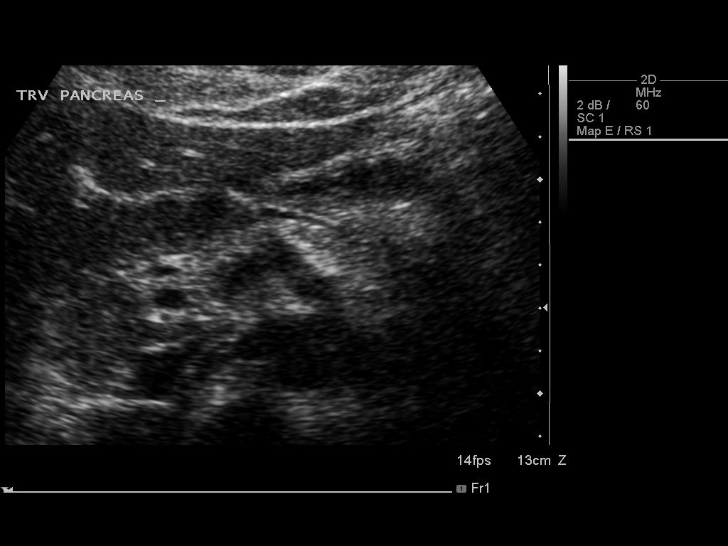
[im 7/74]
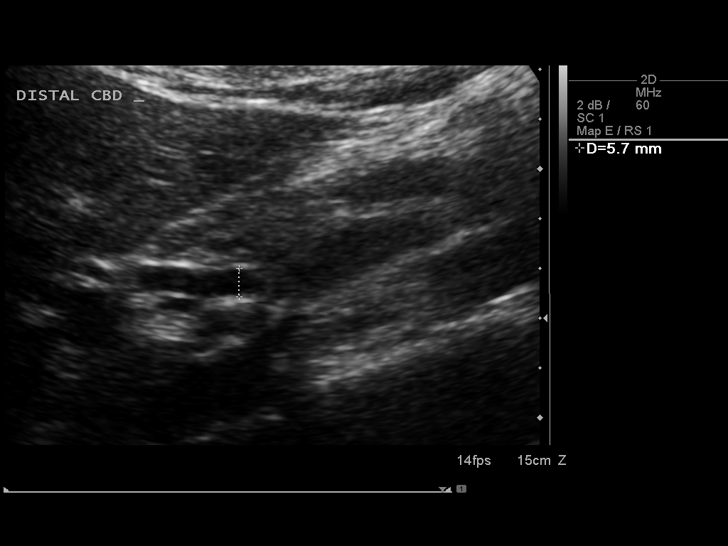
[im 13/74]
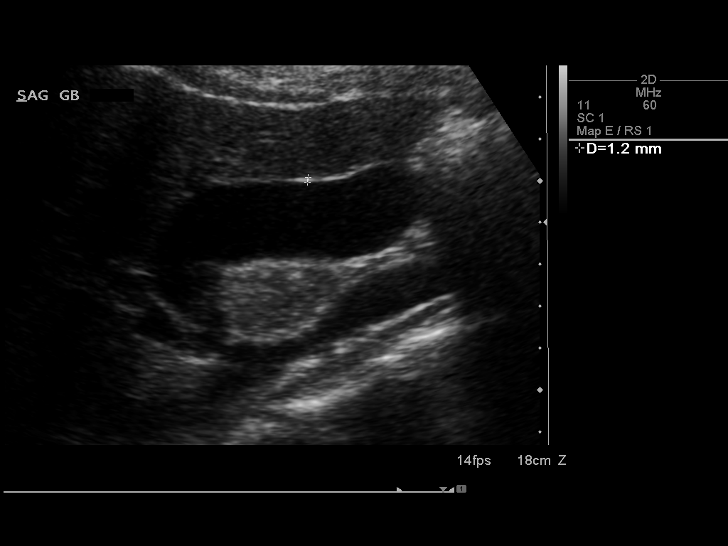
[im 19/74]
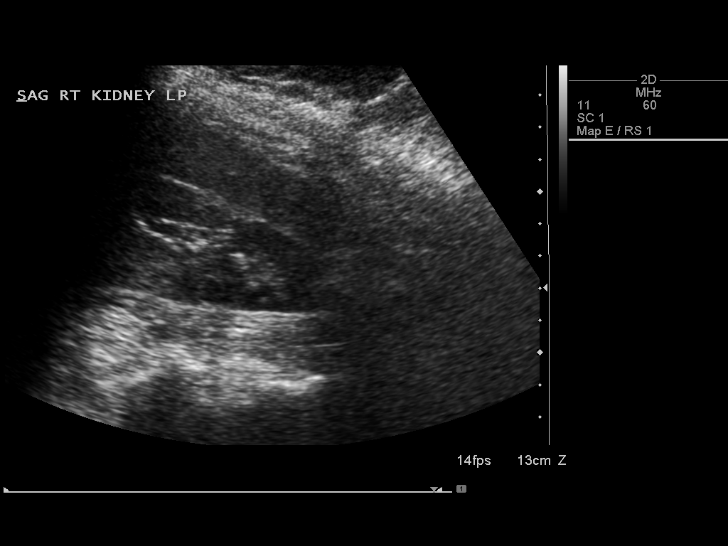
[im 25/74]
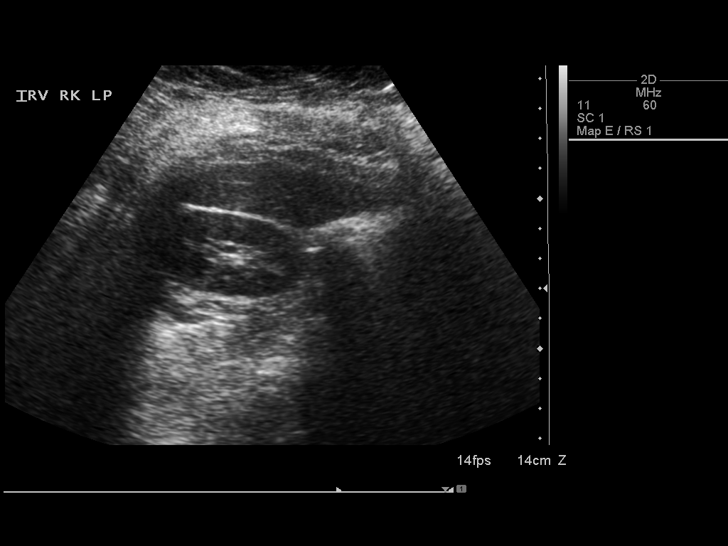
[im 28/74]
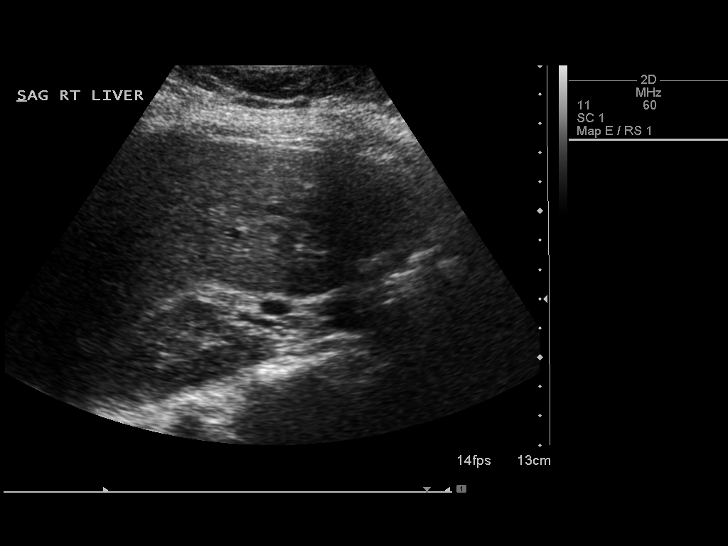
[im 34/74]
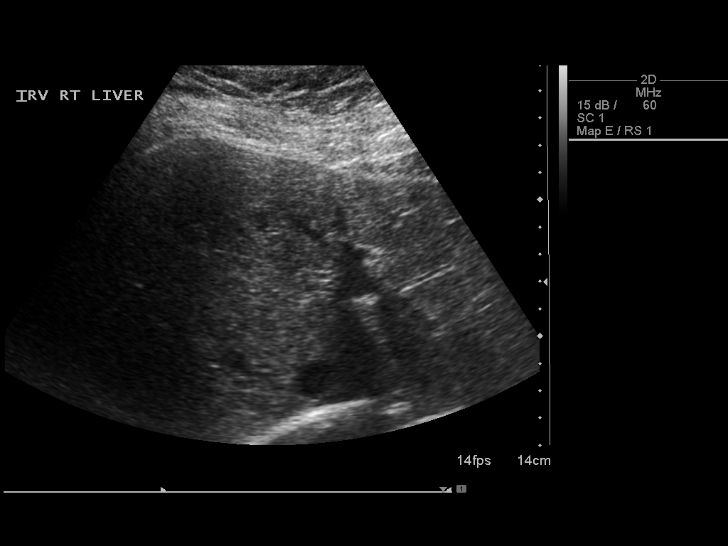
[im 40/74]
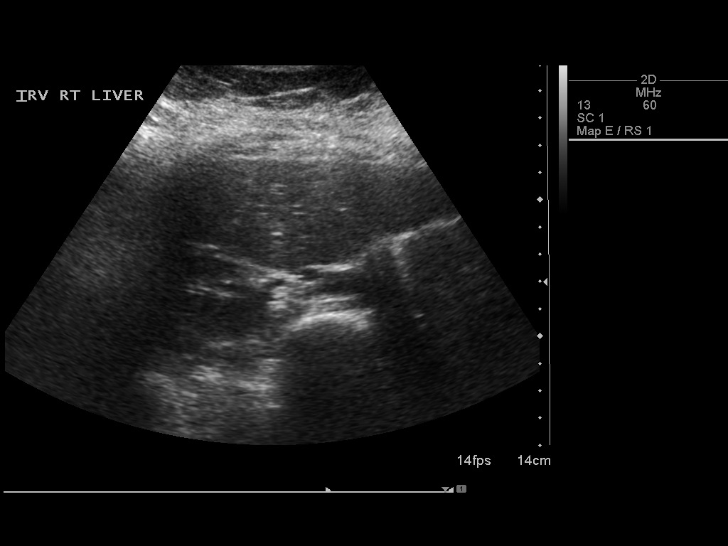
[im 46/74]
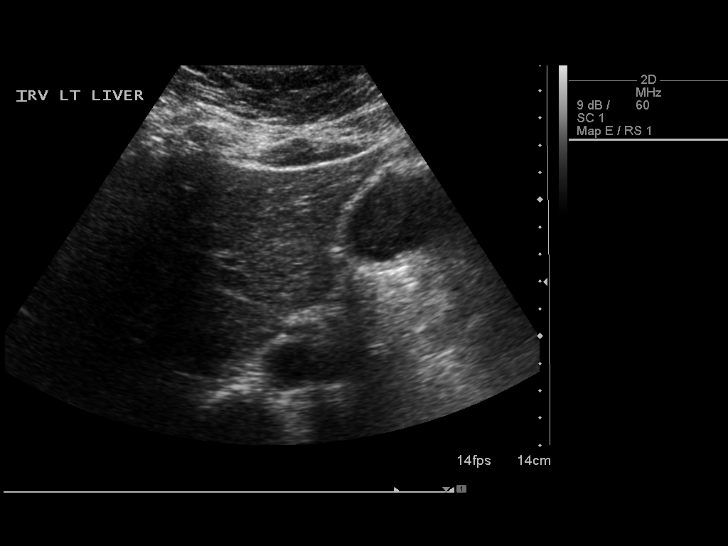
[im 49/74]
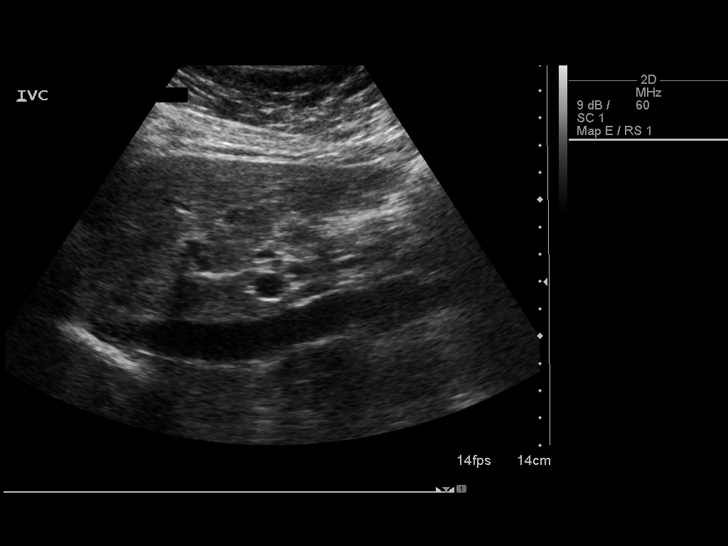
[im 55/74]
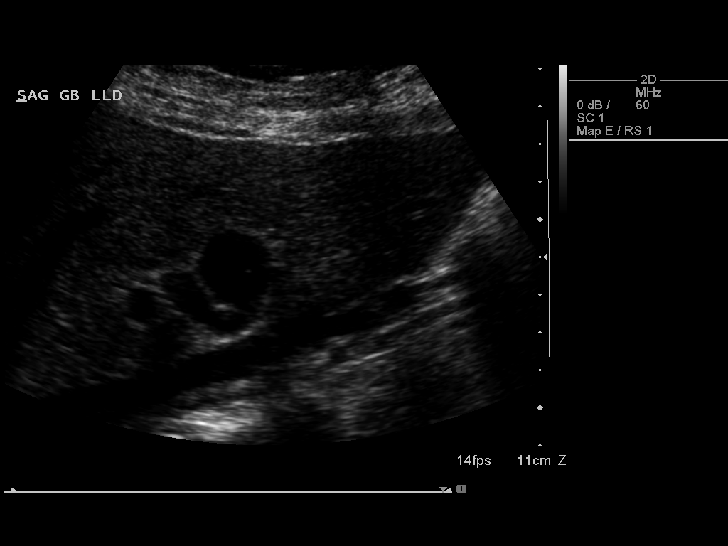
[im 61/74]
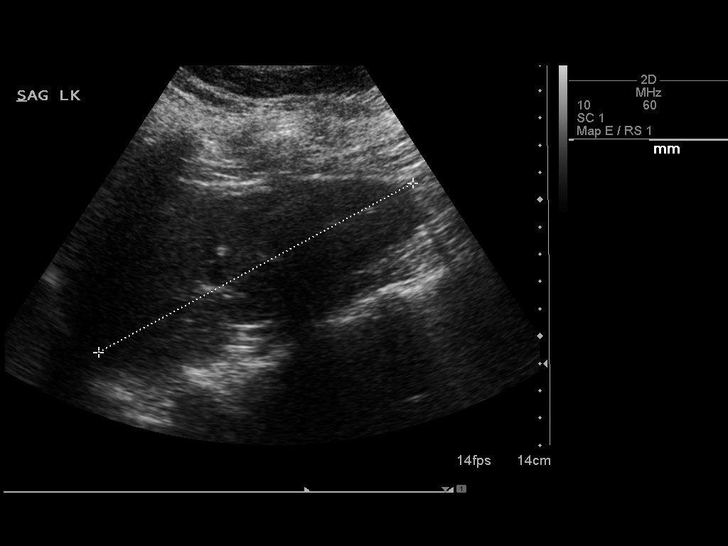
[im 67/74]
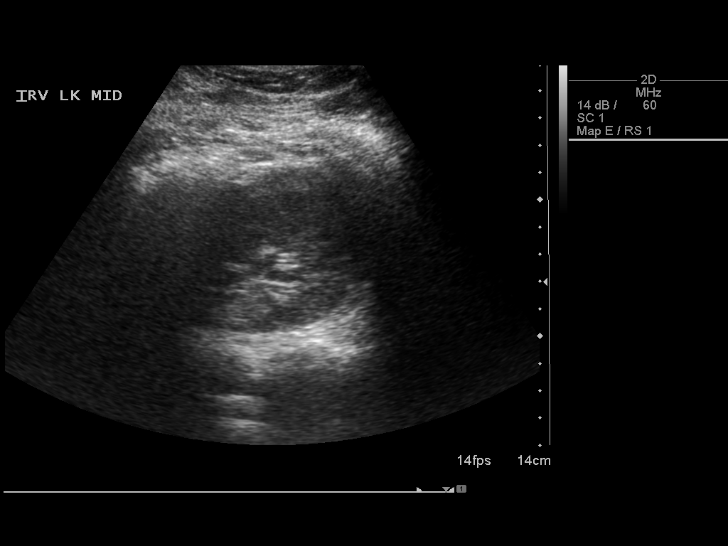
[im 74/74]
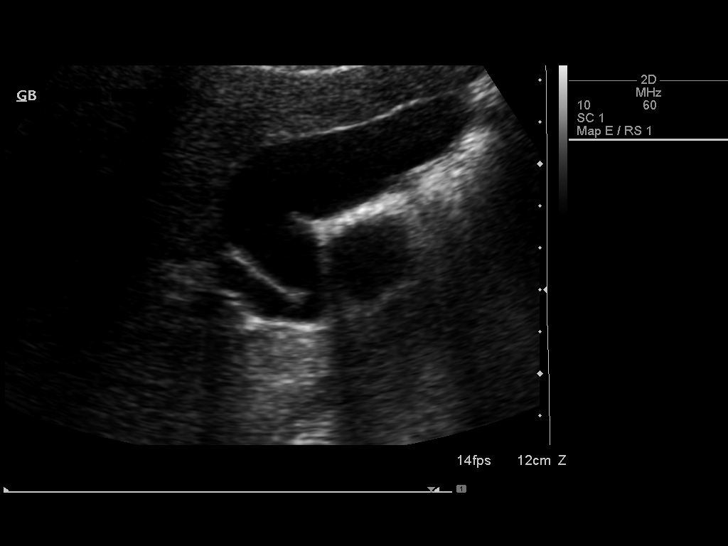

[14 of 25 positions shown; findings below may reference images not displayed]

FINDINGS: Gallbladder: No gallstones or wall thickening visualized. No
sonographic Murphy sign noted.

Common bile duct: Diameter: 5.7 mm

Liver: No focal lesion identified. Within normal limits in
parenchymal echogenicity.

IVC: No abnormality visualized.

Pancreas: Visualized portion unremarkable.

Spleen: Size and appearance within normal limits.

Right Kidney: Length: 12.5 cm. Echogenicity within normal limits. No
mass or hydronephrosis visualized.

Left Kidney: Length: 13.1 cm. Echogenicity within normal limits. No
mass or hydronephrosis visualized.

Abdominal aorta: No aneurysm visualized.

Other findings: None.
IMPRESSION: No significant abnormality identified.

## 2015-11-04 IMAGING — US US PELVIS COMPLETE
1 series · 14 of 25 positions shown · non-contrast
Comparison: None

CLINICAL DATA: Abnormal weight gain, abdominal distention,
bloating, nausea and vomiting. Irregular menstrual cycles with heavy
bleeding for 1 year.

EXAM:
TRANSABDOMINAL AND TRANSVAGINAL ULTRASOUND OF PELVIS
TECHNIQUE: Both transabdominal and transvaginal ultrasound examinations of the
pelvis were performed. Transabdominal technique was performed for
global imaging of the pelvis including uterus, ovaries, adnexal
regions, and pelvic cul-de-sac. It was necessary to proceed with
endovaginal exam following the transabdominal exam to visualize the
uterus and adnexae and better detail..

[Series 1: us pelvis complete · 0.28mm/px · 14 of 67 slices shown]
[im 1/67]
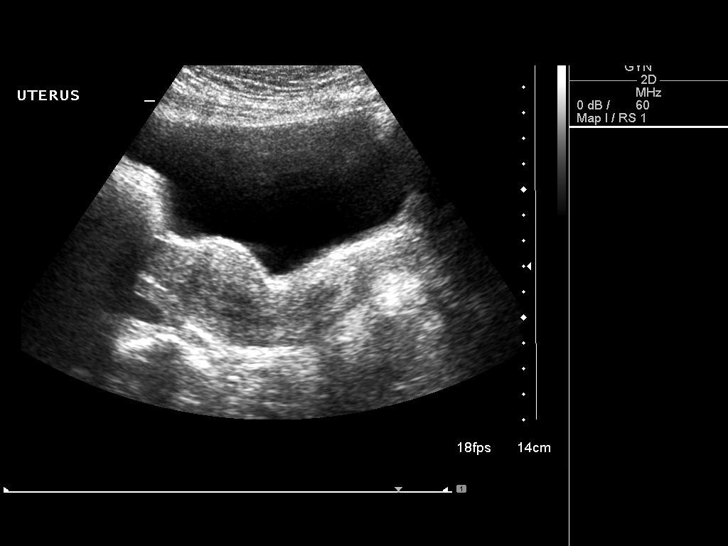
[im 6/67]
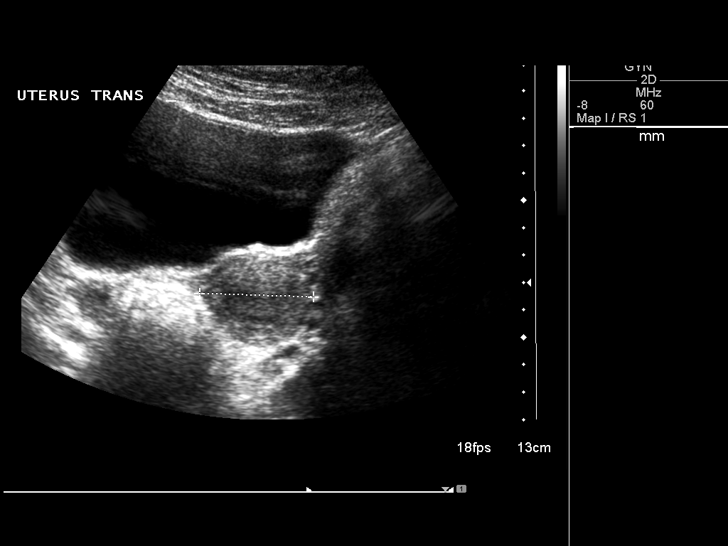
[im 12/67]
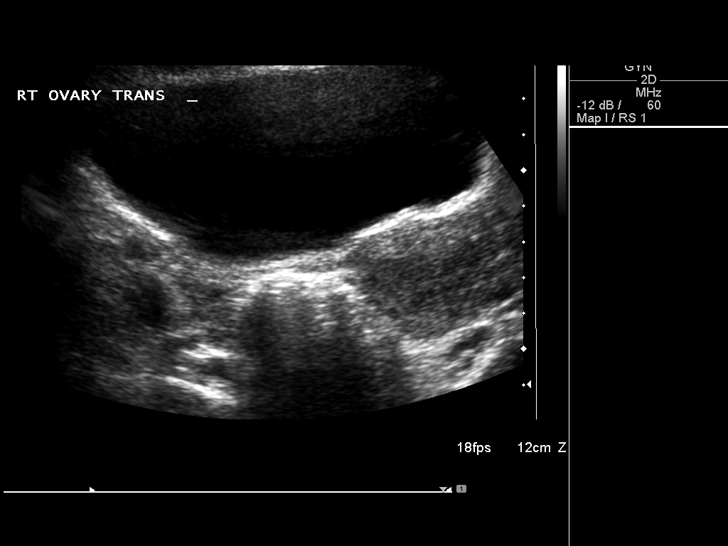
[im 17/67]
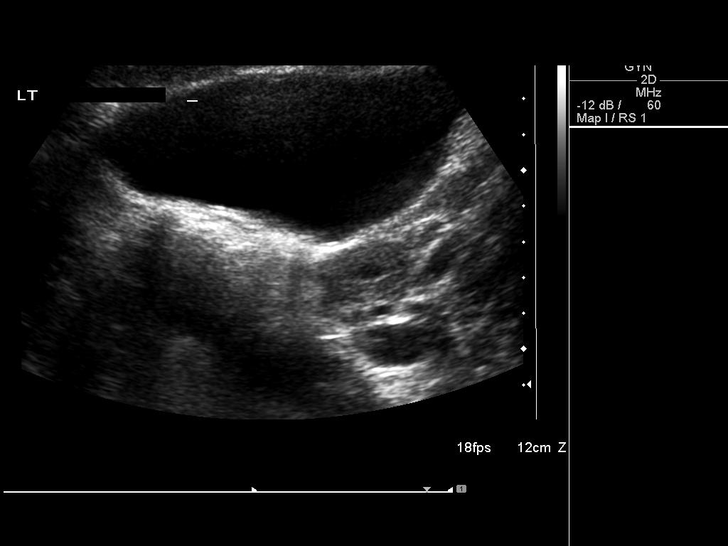
[im 23/67]
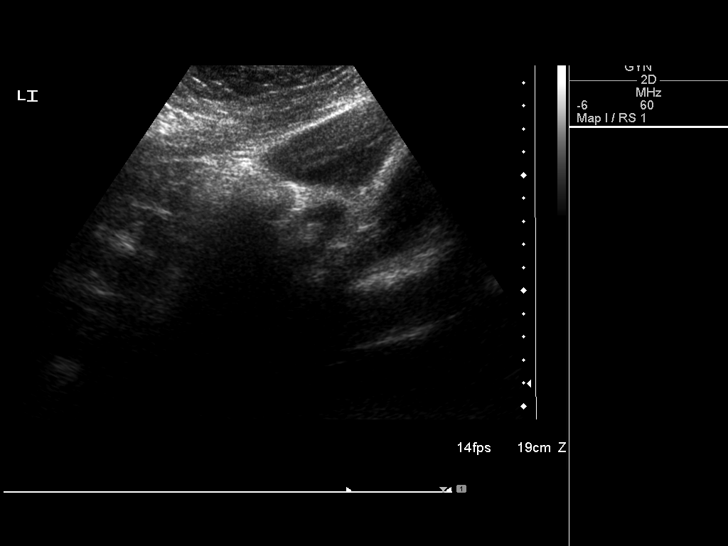
[im 25/67]
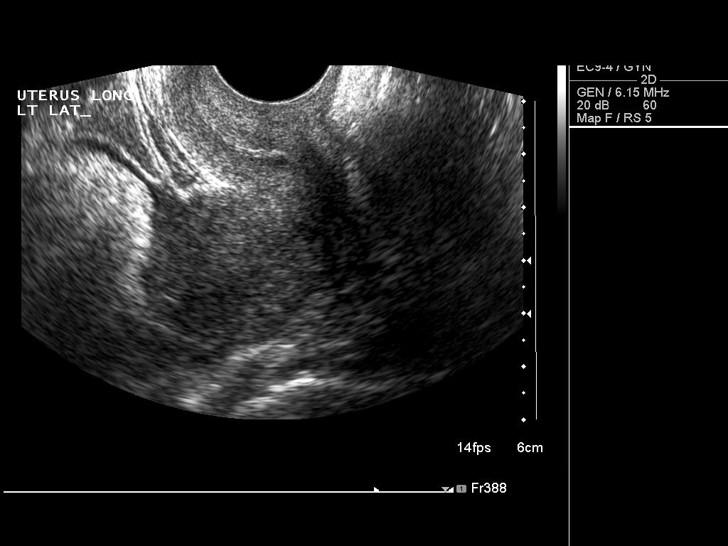
[im 31/67]
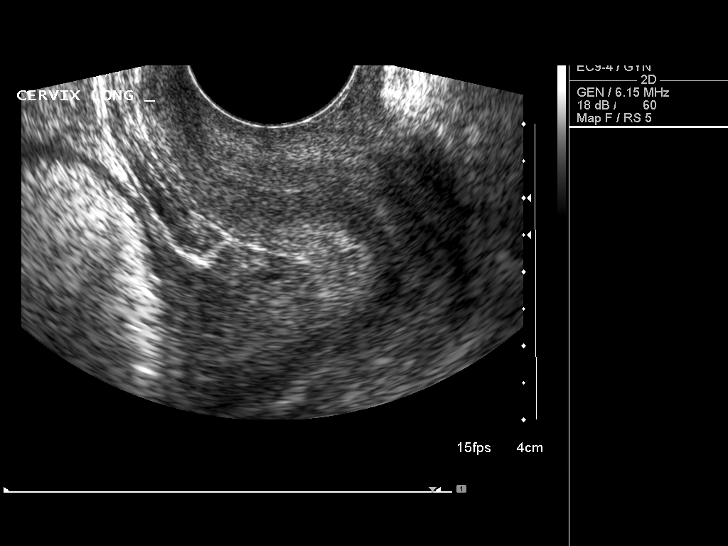
[im 36/67]
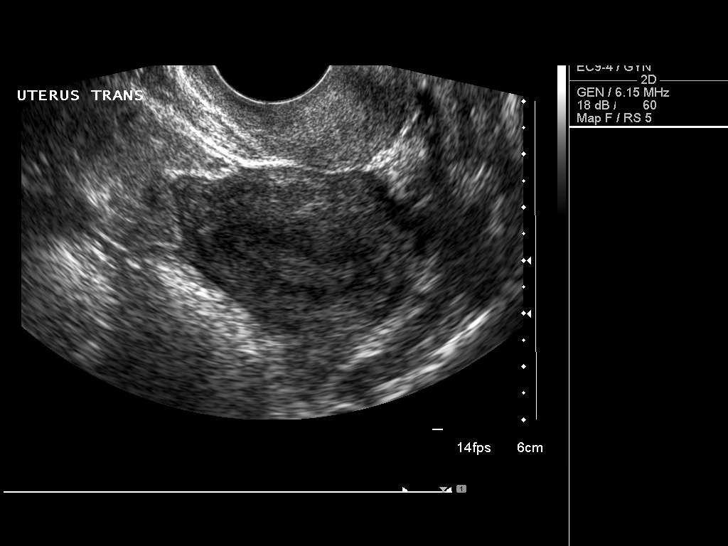
[im 42/67]
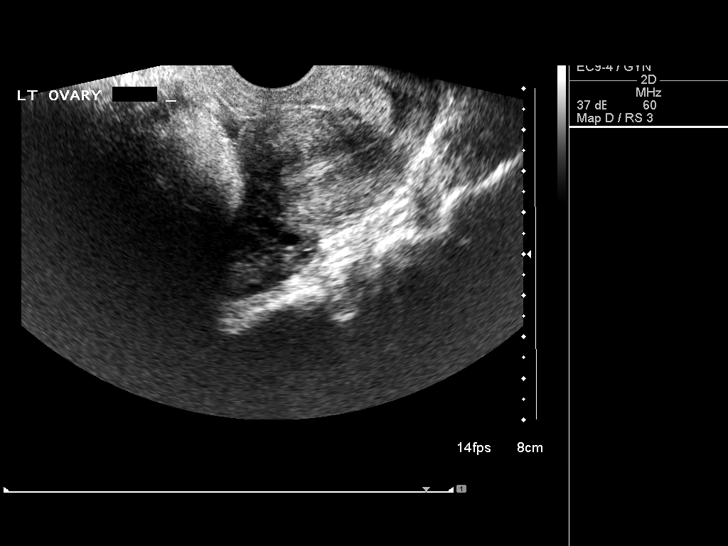
[im 45/67]
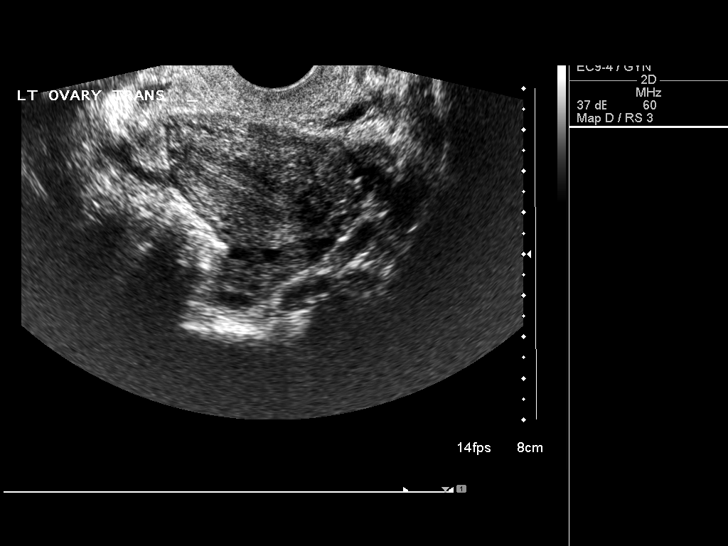
[im 50/67]
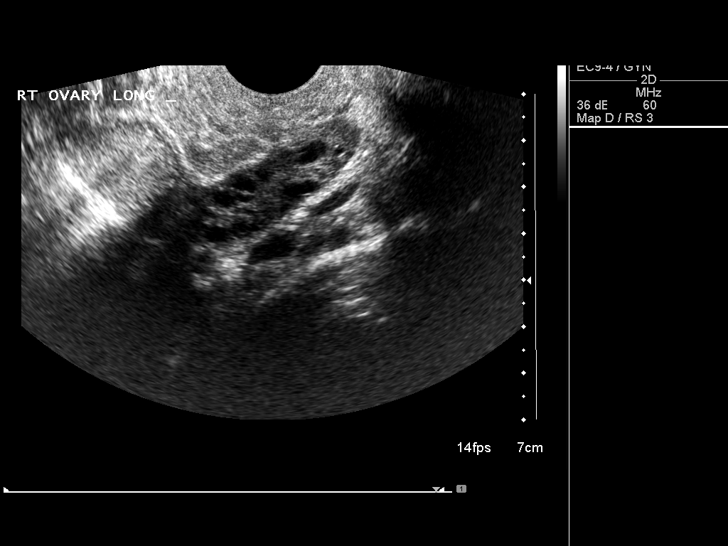
[im 56/67]
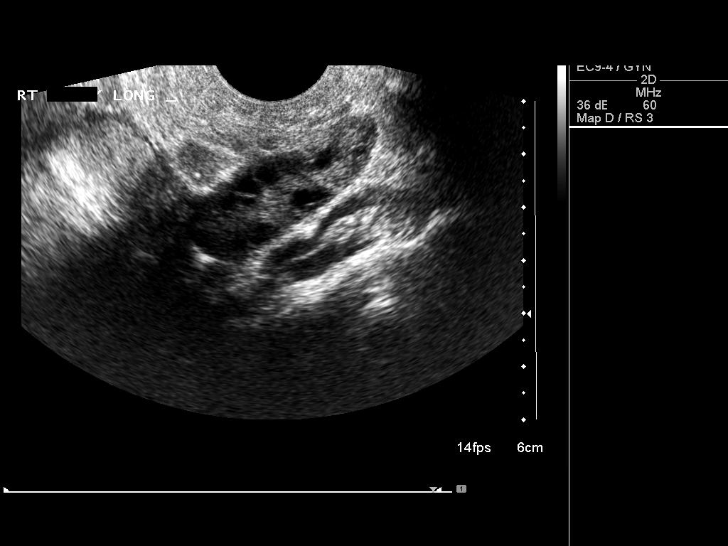
[im 61/67]
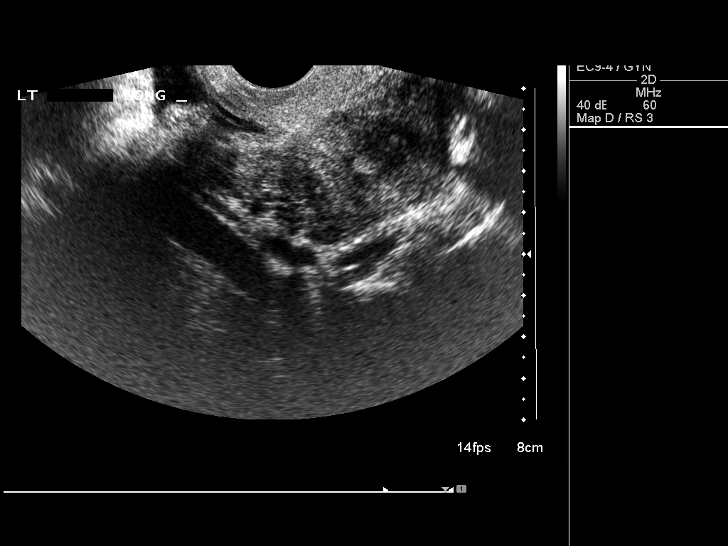
[im 67/67]
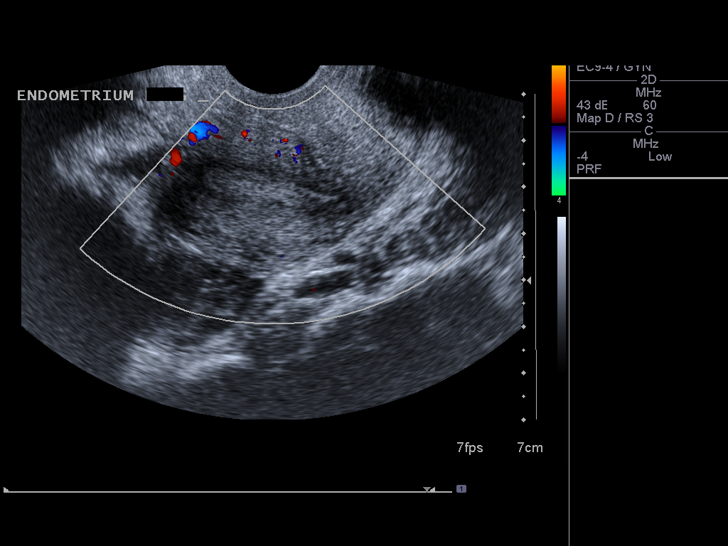

[14 of 25 positions shown; findings below may reference images not displayed]

FINDINGS: Uterus

Measurements: 7.8 x 3.6 x 4.4 cm. No fibroids or other mass
visualized.

Endometrium

Thickness: 4.7 mm.  No focal abnormality visualized.

Right ovary

Measurements: 4.5 x 1.5 x 1.9 cm. Normal appearance/no adnexal mass.

Left ovary

Measurements: 3.0 x 2.0 x 2.8 cm. Normal appearance/no adnexal mass.

Other findings

No free fluid.
IMPRESSION: Normal pelvic ultrasound for age.

## 2015-11-04 SURGERY — PARS PLANA VITRECTOMY WITH 25 GAUGE
Anesthesia: General | Site: Eye | Laterality: Right

## 2015-11-04 MED ORDER — 0.9 % SODIUM CHLORIDE (POUR BTL) OPTIME
TOPICAL | Status: DC | PRN
  Administered 2015-11-04: 1000 mL

## 2015-11-04 MED ORDER — TETRACAINE HCL 0.5 % OP SOLN
2.0000 [drp] | Freq: Once | OPHTHALMIC | Status: DC
  Filled 2015-11-04: qty 2

## 2015-11-04 MED ORDER — OXYCODONE-ACETAMINOPHEN 5-325 MG PO TABS
1.0000 | ORAL_TABLET | Freq: Four times a day (QID) | ORAL | Status: DC | PRN
  Filled 2015-11-04: qty 1

## 2015-11-04 MED ORDER — BACITRACIN-POLYMYXIN B 500-10000 UNIT/GM OP OINT
TOPICAL_OINTMENT | OPHTHALMIC | Status: AC
  Filled 2015-11-04: qty 3.5

## 2015-11-04 MED ORDER — LIDOCAINE HCL 4 % MT SOLN
OROMUCOSAL | Status: DC | PRN
  Administered 2015-11-04: 3 mL via TOPICAL

## 2015-11-04 MED ORDER — ETONOGESTREL 68 MG ~~LOC~~ IMPL: 1.0000 | DRUG_IMPLANT | Freq: Once | SUBCUTANEOUS | Status: DC

## 2015-11-04 MED ORDER — EPINEPHRINE HCL 1 MG/ML IJ SOLN
INTRAMUSCULAR | Status: AC
  Filled 2015-11-04: qty 1

## 2015-11-04 MED ORDER — DEXAMETHASONE SODIUM PHOSPHATE 10 MG/ML IJ SOLN
INTRAMUSCULAR | Status: DC | PRN
  Administered 2015-11-04: 10 mg via INTRAVENOUS

## 2015-11-04 MED ORDER — STERILE WATER FOR INJECTION IJ SOLN
INTRAMUSCULAR | Status: DC | PRN
  Administered 2015-11-04: 20 mL

## 2015-11-04 MED ORDER — SODIUM HYALURONATE 10 MG/ML IO SOLN
INTRAOCULAR | Status: AC
  Filled 2015-11-04: qty 0.85

## 2015-11-04 MED ORDER — BRIMONIDINE TARTRATE 0.2 % OP SOLN
1.0000 [drp] | Freq: Two times a day (BID) | OPHTHALMIC | Status: DC
  Filled 2015-11-04: qty 5

## 2015-11-04 MED ORDER — SODIUM CHLORIDE 0.45 % IV SOLN
INTRAVENOUS | Status: DC
  Administered 2015-11-04: 17:00:00 via INTRAVENOUS

## 2015-11-04 MED ORDER — POLYMYXIN B SULFATE 500000 UNITS IJ SOLR
INTRAMUSCULAR | Status: AC
  Filled 2015-11-04: qty 1

## 2015-11-04 MED ORDER — HEMOSTATIC AGENTS (NO CHARGE) OPTIME
TOPICAL | Status: DC | PRN
  Administered 2015-11-04: 1 via TOPICAL

## 2015-11-04 MED ORDER — LATANOPROST 0.005 % OP SOLN
1.0000 [drp] | Freq: Every day | OPHTHALMIC | Status: DC
  Filled 2015-11-04: qty 2.5

## 2015-11-04 MED ORDER — BSS PLUS IO SOLN
INTRAOCULAR | Status: AC
  Filled 2015-11-04: qty 500

## 2015-11-04 MED ORDER — MAGNESIUM HYDROXIDE 400 MG/5ML PO SUSP: 15.0000 mL | Freq: Four times a day (QID) | ORAL | Status: DC | PRN

## 2015-11-04 MED ORDER — INSULIN DEGLUDEC 100 UNIT/ML ~~LOC~~ SOPN
40.0000 [IU] | PEN_INJECTOR | Freq: Every day | SUBCUTANEOUS | Status: DC
  Administered 2015-11-04: 40 [IU] via SUBCUTANEOUS
  Filled 2015-11-04: qty 1

## 2015-11-04 MED ORDER — PHENYLEPHRINE HCL 10 MG/ML IJ SOLN
INTRAMUSCULAR | Status: DC | PRN
  Administered 2015-11-04: 80 ug via INTRAVENOUS

## 2015-11-04 MED ORDER — FENTANYL CITRATE (PF) 100 MCG/2ML IJ SOLN
INTRAMUSCULAR | Status: AC
  Filled 2015-11-04: qty 2

## 2015-11-04 MED ORDER — BISMUTH SUBSALICYLATE 262 MG/15ML PO SUSP: 30.0000 mL | Freq: Four times a day (QID) | ORAL | Status: DC | PRN

## 2015-11-04 MED ORDER — MORPHINE SULFATE (PF) 2 MG/ML IV SOLN
1.0000 mg | INTRAVENOUS | Status: AC | PRN
  Administered 2015-11-04 (×2): 2 mg via INTRAVENOUS
  Filled 2015-11-04 (×2): qty 1

## 2015-11-04 MED ORDER — TURMERIC 500 MG PO CAPS: 300.0000 mg | ORAL_CAPSULE | Freq: Every day | ORAL | Status: DC

## 2015-11-04 MED ORDER — PROPOFOL 10 MG/ML IV BOLUS
INTRAVENOUS | Status: DC | PRN
  Administered 2015-11-04: 200 mg via INTRAVENOUS

## 2015-11-04 MED ORDER — ASPIRIN EFFERVESCENT 325 MG PO TBEF: 325.0000 mg | EFFERVESCENT_TABLET | Freq: Four times a day (QID) | ORAL | Status: DC | PRN

## 2015-11-04 MED ORDER — ACETAZOLAMIDE SODIUM 500 MG IJ SOLR
500.0000 mg | Freq: Once | INTRAMUSCULAR | Status: AC
  Administered 2015-11-05: 500 mg via INTRAVENOUS
  Filled 2015-11-04: qty 500

## 2015-11-04 MED ORDER — SODIUM HYALURONATE 10 MG/ML IO SOLN
INTRAOCULAR | Status: DC | PRN
  Administered 2015-11-04: 0.85 mL via INTRAOCULAR

## 2015-11-04 MED ORDER — INSULIN ASPART 100 UNIT/ML ~~LOC~~ SOLN
5.0000 [IU] | Freq: Three times a day (TID) | SUBCUTANEOUS | Status: DC
  Administered 2015-11-04: 9 [IU] via SUBCUTANEOUS

## 2015-11-04 MED ORDER — HYPROMELLOSE (GONIOSCOPIC) 2.5 % OP SOLN
OPHTHALMIC | Status: AC
  Filled 2015-11-04: qty 15

## 2015-11-04 MED ORDER — INSULIN ASPART 100 UNIT/ML FLEXPEN
5.0000 [IU] | PEN_INJECTOR | Freq: Three times a day (TID) | SUBCUTANEOUS | Status: DC
  Filled 2015-11-04: qty 3

## 2015-11-04 MED ORDER — FENTANYL CITRATE (PF) 100 MCG/2ML IJ SOLN
25.0000 ug | INTRAMUSCULAR | Status: DC | PRN
  Administered 2015-11-04: 50 ug via INTRAVENOUS
  Administered 2015-11-04 (×2): 25 ug via INTRAVENOUS

## 2015-11-04 MED ORDER — BUPIVACAINE HCL (PF) 0.75 % IJ SOLN
INTRAMUSCULAR | Status: DC | PRN
  Administered 2015-11-04: 20 mL

## 2015-11-04 MED ORDER — BRIMONIDINE TARTRATE 0.15 % OP SOLN
1.0000 [drp] | Freq: Two times a day (BID) | OPHTHALMIC | Status: DC
  Administered 2015-11-04: 1 [drp] via OPHTHALMIC
  Filled 2015-11-04: qty 5

## 2015-11-04 MED ORDER — BUPIVACAINE-EPINEPHRINE (PF) 0.25% -1:200000 IJ SOLN
INTRAMUSCULAR | Status: AC
  Filled 2015-11-04: qty 30

## 2015-11-04 MED ORDER — ACETAZOLAMIDE SODIUM 500 MG IJ SOLR
INTRAMUSCULAR | Status: AC
  Filled 2015-11-04: qty 500

## 2015-11-04 MED ORDER — FENTANYL CITRATE (PF) 100 MCG/2ML IJ SOLN
INTRAMUSCULAR | Status: DC | PRN
Start: 2015-11-04 — End: 2015-11-04
  Administered 2015-11-04: 100 ug via INTRAVENOUS
  Administered 2015-11-04 (×2): 50 ug via INTRAVENOUS

## 2015-11-04 MED ORDER — TEMAZEPAM 15 MG PO CAPS: 15.0000 mg | ORAL_CAPSULE | Freq: Every evening | ORAL | Status: DC | PRN

## 2015-11-04 MED ORDER — PHENYLEPHRINE HCL 2.5 % OP SOLN
1.0000 [drp] | OPHTHALMIC | Status: AC
  Administered 2015-11-04 (×3): 1 [drp] via OPHTHALMIC
  Filled 2015-11-04: qty 2

## 2015-11-04 MED ORDER — ONDANSETRON HCL 4 MG/2ML IJ SOLN: 4.0000 mg | Freq: Four times a day (QID) | INTRAMUSCULAR | Status: DC | PRN

## 2015-11-04 MED ORDER — HYDROCODONE-ACETAMINOPHEN 5-325 MG PO TABS
1.0000 | ORAL_TABLET | ORAL | Status: DC | PRN
  Administered 2015-11-04 – 2015-11-05 (×2): 2 via ORAL
  Filled 2015-11-04 (×2): qty 2

## 2015-11-04 MED ORDER — DEXAMETHASONE SODIUM PHOSPHATE 10 MG/ML IJ SOLN
INTRAMUSCULAR | Status: AC
  Filled 2015-11-04: qty 1

## 2015-11-04 MED ORDER — TRIAMCINOLONE ACETONIDE 40 MG/ML IJ SUSP
INTRAMUSCULAR | Status: AC
  Filled 2015-11-04: qty 5

## 2015-11-04 MED ORDER — TROPICAMIDE 1 % OP SOLN
1.0000 [drp] | OPHTHALMIC | Status: AC
  Administered 2015-11-04 (×3): 1 [drp] via OPHTHALMIC
  Filled 2015-11-04: qty 3

## 2015-11-04 MED ORDER — VENLAFAXINE HCL 37.5 MG PO TABS: 75.0000 mg | ORAL_TABLET | Freq: Every day | ORAL | Status: DC

## 2015-11-04 MED ORDER — LIDOCAINE HCL 2 % IJ SOLN
INTRAMUSCULAR | Status: AC
  Filled 2015-11-04: qty 20

## 2015-11-04 MED ORDER — PHENYLEPHRINE 40 MCG/ML (10ML) SYRINGE FOR IV PUSH (FOR BLOOD PRESSURE SUPPORT)
PREFILLED_SYRINGE | INTRAVENOUS | Status: DC | PRN
  Administered 2015-11-04: 80 ug via INTRAVENOUS

## 2015-11-04 MED ORDER — ALPRAZOLAM 0.5 MG PO TABS: 0.5000 mg | ORAL_TABLET | Freq: Every evening | ORAL | Status: DC | PRN

## 2015-11-04 MED ORDER — NEOSTIGMINE METHYLSULFATE 5 MG/5ML IV SOSY
PREFILLED_SYRINGE | INTRAVENOUS | Status: DC | PRN
  Administered 2015-11-04: 3 mg via INTRAVENOUS

## 2015-11-04 MED ORDER — BSS IO SOLN
INTRAOCULAR | Status: AC
  Filled 2015-11-04: qty 15

## 2015-11-04 MED ORDER — ONDANSETRON HCL 4 MG/2ML IJ SOLN
INTRAMUSCULAR | Status: DC | PRN
  Administered 2015-11-04: 4 mg via INTRAVENOUS

## 2015-11-04 MED ORDER — GLYCOPYRROLATE 0.2 MG/ML IV SOSY
PREFILLED_SYRINGE | INTRAVENOUS | Status: DC | PRN
  Administered 2015-11-04: .4 mg via INTRAVENOUS

## 2015-11-04 MED ORDER — VENLAFAXINE HCL ER 75 MG PO CP24
150.0000 mg | ORAL_CAPSULE | Freq: Every day | ORAL | Status: DC
  Administered 2015-11-04: 150 mg via ORAL
  Filled 2015-11-04: qty 2

## 2015-11-04 MED ORDER — CYCLOPENTOLATE HCL 1 % OP SOLN
1.0000 [drp] | OPHTHALMIC | Status: AC
  Administered 2015-11-04 (×3): 1 [drp] via OPHTHALMIC
  Filled 2015-11-04: qty 2

## 2015-11-04 MED ORDER — SODIUM CHLORIDE 0.9 % IV SOLN
INTRAVENOUS | Status: DC
  Administered 2015-11-04 (×2): via INTRAVENOUS

## 2015-11-04 MED ORDER — GATIFLOXACIN 0.5 % OP SOLN
1.0000 [drp] | OPHTHALMIC | Status: AC
  Administered 2015-11-04 (×3): 1 [drp] via OPHTHALMIC
  Filled 2015-11-04: qty 2.5

## 2015-11-04 MED ORDER — BACITRACIN-POLYMYXIN B 500-10000 UNIT/GM OP OINT
1.0000 "application " | TOPICAL_OINTMENT | Freq: Three times a day (TID) | OPHTHALMIC | Status: DC
  Filled 2015-11-04: qty 3.5

## 2015-11-04 MED ORDER — PREDNISOLONE ACETATE 1 % OP SUSP
1.0000 [drp] | Freq: Four times a day (QID) | OPHTHALMIC | Status: DC
  Filled 2015-11-04: qty 5

## 2015-11-04 MED ORDER — CEFTAZIDIME 1 G IJ SOLR
INTRAMUSCULAR | Status: AC
  Filled 2015-11-04: qty 1

## 2015-11-04 MED ORDER — STERILE WATER FOR INJECTION IJ SOLN
INTRAMUSCULAR | Status: AC
  Filled 2015-11-04: qty 20

## 2015-11-04 MED ORDER — BSS IO SOLN
INTRAOCULAR | Status: DC | PRN
  Administered 2015-11-04: 15 mL via INTRAOCULAR

## 2015-11-04 MED ORDER — MIDAZOLAM HCL 2 MG/2ML IJ SOLN
INTRAMUSCULAR | Status: AC
  Filled 2015-11-04: qty 2

## 2015-11-04 MED ORDER — GATIFLOXACIN 0.5 % OP SOLN
1.0000 [drp] | Freq: Four times a day (QID) | OPHTHALMIC | Status: DC
  Filled 2015-11-04: qty 2.5

## 2015-11-04 MED ORDER — LORATADINE 10 MG PO TABS: 10.0000 mg | ORAL_TABLET | Freq: Every day | ORAL | Status: DC

## 2015-11-04 MED ORDER — IBUPROFEN-DIPHENHYDRAMINE CIT 200-38 MG PO TABS: 2.0000 | ORAL_TABLET | Freq: Every evening | ORAL | Status: DC | PRN

## 2015-11-04 MED ORDER — IBUPROFEN 200 MG PO TABS: 100.0000 mg | ORAL_TABLET | Freq: Four times a day (QID) | ORAL | Status: DC | PRN

## 2015-11-04 MED ORDER — ACETAMINOPHEN 325 MG PO TABS: 325.0000 mg | ORAL_TABLET | ORAL | Status: DC | PRN

## 2015-11-04 MED ORDER — MIDAZOLAM HCL 5 MG/5ML IJ SOLN
INTRAMUSCULAR | Status: DC | PRN
  Administered 2015-11-04: 2 mg via INTRAVENOUS

## 2015-11-04 MED ORDER — ATROPINE SULFATE 1 % OP SOLN
OPHTHALMIC | Status: AC
  Filled 2015-11-04: qty 5

## 2015-11-04 MED ORDER — EPINEPHRINE HCL 1 MG/ML IJ SOLN
INTRAMUSCULAR | Status: DC | PRN
  Administered 2015-11-04: .3 mL

## 2015-11-04 MED ORDER — BACITRACIN-POLYMYXIN B 500-10000 UNIT/GM OP OINT
TOPICAL_OINTMENT | OPHTHALMIC | Status: DC | PRN
  Administered 2015-11-04: 1 via OPHTHALMIC

## 2015-11-04 MED ORDER — ROCURONIUM BROMIDE 10 MG/ML (PF) SYRINGE
PREFILLED_SYRINGE | INTRAVENOUS | Status: DC | PRN
  Administered 2015-11-04: 30 mg via INTRAVENOUS
  Administered 2015-11-04: 10 mg via INTRAVENOUS

## 2015-11-04 MED ORDER — DORZOLAMIDE HCL 2 % OP SOLN
1.0000 [drp] | Freq: Three times a day (TID) | OPHTHALMIC | Status: DC
  Filled 2015-11-04: qty 10

## 2015-11-04 MED ORDER — LIDOCAINE 2% (20 MG/ML) 5 ML SYRINGE
INTRAMUSCULAR | Status: DC | PRN
  Administered 2015-11-04: 100 mg via INTRAVENOUS

## 2015-11-04 MED ORDER — SODIUM CHLORIDE 0.9 % IJ SOLN
INTRAMUSCULAR | Status: AC
  Filled 2015-11-04: qty 10

## 2015-11-04 MED ORDER — PROMETHAZINE HCL 25 MG/ML IJ SOLN: 6.2500 mg | INTRAMUSCULAR | Status: DC | PRN

## 2015-11-04 MED ORDER — BUPIVACAINE HCL (PF) 0.75 % IJ SOLN
INTRAMUSCULAR | Status: AC
  Filled 2015-11-04: qty 10

## 2015-11-04 SURGICAL SUPPLY — 63 items
APPLICATOR DR MATTHEWS STRL (MISCELLANEOUS) IMPLANT
BLADE EYE CATARACT 19 1.4 BEAV (BLADE) IMPLANT
BLADE MVR KNIFE 19G (BLADE) IMPLANT
BLADE MVR KNIFE 20G (BLADE) IMPLANT
CANNULA DUAL BORE 23G (CANNULA) IMPLANT
CANNULA VLV SOFT TIP 25GA (OPHTHALMIC) ×2 IMPLANT
CORDS BIPOLAR (ELECTRODE) ×2 IMPLANT
COTTONBALL LRG STERILE PKG (GAUZE/BANDAGES/DRESSINGS) ×6 IMPLANT
COVER MAYO STAND STRL (DRAPES) IMPLANT
DRAPE INCISE 51X51 W/FILM STRL (DRAPES) IMPLANT
DRAPE OPHTHALMIC 77X100 STRL (CUSTOM PROCEDURE TRAY) ×2 IMPLANT
ERASER HMR WETFIELD 23G BP (MISCELLANEOUS) ×2 IMPLANT
FILTER BLUE MILLIPORE (MISCELLANEOUS) IMPLANT
FILTER STRAW FLUID ASPIR (MISCELLANEOUS) IMPLANT
FORCEPS ECKARDT ILM 25G SERR (OPHTHALMIC RELATED) IMPLANT
FORCEPS GRIESHABER ILM 25G A (INSTRUMENTS) ×2 IMPLANT
GLOVE SS BIOGEL STRL SZ 6.5 (GLOVE) ×1 IMPLANT
GLOVE SS BIOGEL STRL SZ 7 (GLOVE) ×1 IMPLANT
GLOVE SUPERSENSE BIOGEL SZ 6.5 (GLOVE) ×1
GLOVE SUPERSENSE BIOGEL SZ 7 (GLOVE) ×1
GLOVE SURG 8.5 LATEX PF (GLOVE) ×2 IMPLANT
GOWN STRL REUS W/ TWL LRG LVL3 (GOWN DISPOSABLE) ×3 IMPLANT
GOWN STRL REUS W/TWL LRG LVL3 (GOWN DISPOSABLE) ×3
HANDLE PNEUMATIC FOR CONSTEL (OPHTHALMIC) IMPLANT
KIT BASIN OR (CUSTOM PROCEDURE TRAY) ×2 IMPLANT
KNIFE CRESCENT 2.5 55 ANG (BLADE) IMPLANT
MICROPICK 25G (MISCELLANEOUS)
NEEDLE 18GX1X1/2 (RX/OR ONLY) (NEEDLE) ×2 IMPLANT
NEEDLE 25GX 5/8IN NON SAFETY (NEEDLE) IMPLANT
NEEDLE FILTER BLUNT 18X 1/2SAF (NEEDLE) ×1
NEEDLE FILTER BLUNT 18X1 1/2 (NEEDLE) ×1 IMPLANT
NEEDLE HYPO 30X.5 LL (NEEDLE) IMPLANT
NS IRRIG 1000ML POUR BTL (IV SOLUTION) ×2 IMPLANT
PACK VITRECTOMY CUSTOM (CUSTOM PROCEDURE TRAY) ×2 IMPLANT
PAD ARMBOARD 7.5X6 YLW CONV (MISCELLANEOUS) ×4 IMPLANT
PAK PIK VITRECTOMY CVS 25GA (OPHTHALMIC) ×2 IMPLANT
PENCIL BIPOLAR 25GA STR DISP (OPHTHALMIC RELATED) IMPLANT
PIC ILLUMINATED 25G (OPHTHALMIC) ×2
PICK MICROPICK 25G (MISCELLANEOUS) IMPLANT
PIK ILLUMINATED 25G (OPHTHALMIC) ×1 IMPLANT
PROBE LASER ILLUM FLEX CVD 25G (OPHTHALMIC) IMPLANT
REPL STRA BRUSH NEEDLE (NEEDLE) IMPLANT
RESERVOIR BACK FLUSH (MISCELLANEOUS) IMPLANT
ROLLS DENTAL (MISCELLANEOUS) ×4 IMPLANT
SCISSORS TIP ADVANCED DSP 25GA (INSTRUMENTS) IMPLANT
SCRAPER DIAMOND 25GA (OPHTHALMIC RELATED) IMPLANT
SCRAPER DIAMOND DUST MEMBRANE (MISCELLANEOUS) IMPLANT
SPONGE SURGIFOAM ABS GEL 12-7 (HEMOSTASIS) ×2 IMPLANT
STOPCOCK 4 WAY LG BORE MALE ST (IV SETS) IMPLANT
SUT CHROMIC 7 0 TG140 8 (SUTURE) IMPLANT
SUT ETHILON 10 0 CS140 6 (SUTURE) IMPLANT
SUT ETHILON 9 0 TG140 8 (SUTURE) IMPLANT
SUT POLY NON ABSORB 10-0 8 STR (SUTURE) IMPLANT
SUT SILK 4 0 RB 1 (SUTURE) IMPLANT
SYR 20CC LL (SYRINGE) ×2 IMPLANT
SYR 5ML LL (SYRINGE) IMPLANT
SYR BULB 3OZ (MISCELLANEOUS) ×2 IMPLANT
SYR TB 1ML LUER SLIP (SYRINGE) ×2 IMPLANT
SYRINGE 10CC LL (SYRINGE) IMPLANT
TOWEL OR 17X24 6PK STRL BLUE (TOWEL DISPOSABLE) ×2 IMPLANT
TUBING HIGH PRESS EXTEN 6IN (TUBING) IMPLANT
WATER STERILE IRR 1000ML POUR (IV SOLUTION) ×2 IMPLANT
WIPE INSTRUMENT VISIWIPE 73X73 (MISCELLANEOUS) IMPLANT

## 2015-11-04 NOTE — Anesthesia Preprocedure Evaluation (Addendum)
Anesthesia Evaluation  Patient identified by MRN, date of birth, ID band Patient awake    Reviewed: Allergy & Precautions, NPO status , Patient's Chart, lab work & pertinent test results  History of Anesthesia Complications Negative for: history of anesthetic complications  Airway Mallampati: I  TM Distance: >3 FB Neck ROM: Full    Dental  (+) Teeth Intact, Dental Advisory Given   Pulmonary neg pulmonary ROS, neg shortness of breath, neg sleep apnea, neg COPD, neg recent URI,    Pulmonary exam normal breath sounds clear to auscultation       Cardiovascular (-) angina(-) Orthopnea negative cardio ROS  (-) dysrhythmias  Rhythm:Regular Rate:Normal     Neuro/Psych  Headaches, neg Seizures Peripheral neuropathy    GI/Hepatic negative GI ROS, Neg liver ROS, neg GERD  ,  Endo/Other  diabetes, Type 1, Insulin Dependent  Renal/GU negative Renal ROS     Musculoskeletal  (+) Fibromyalgia -  Abdominal (+) + obese,   Peds  Hematology negative hematology ROS (+)   Anesthesia Other Findings Vitreous hemorrhage right eye  Reproductive/Obstetrics                           Anesthesia Physical Anesthesia Plan  ASA: II  Anesthesia Plan: General   Post-op Pain Management:    Induction: Intravenous  Airway Management Planned: Oral ETT  Additional Equipment:   Intra-op Plan:   Post-operative Plan: Extubation in OR  Informed Consent: I have reviewed the patients History and Physical, chart, labs and discussed the procedure including the risks, benefits and alternatives for the proposed anesthesia with the patient or authorized representative who has indicated his/her understanding and acceptance.   Dental advisory given  Plan Discussed with: CRNA  Anesthesia Plan Comments: (Risks of general anesthesia discussed including, but not limited to, sore throat, hoarse voice, chipped/damaged teeth,  injury to vocal cords, nausea and vomiting, allergic reactions, lung infection, heart attack, stroke, and death. All questions answered. )       Anesthesia Quick Evaluation

## 2015-11-04 NOTE — Anesthesia Postprocedure Evaluation (Signed)
Anesthesia Post Note  Patient: Gilford RileCourtney Poer  Procedure(s) Performed: Procedure(s) (LRB): PARS PLANA VITRECTOMY WITH 25 GAUGE TO REPAIR A COMPLEX TRACTION RETINAL DETACHMENT (Right)  Patient location during evaluation: PACU Anesthesia Type: General Level of consciousness: awake and alert Pain management: pain level controlled Vital Signs Assessment: post-procedure vital signs reviewed and stable Respiratory status: spontaneous breathing, nonlabored ventilation and respiratory function stable Cardiovascular status: blood pressure returned to baseline and stable Postop Assessment: no signs of nausea or vomiting Anesthetic complications: no    Last Vitals:  Vitals:   11/04/15 1600 11/04/15 1622  BP: 110/73 (!) 150/88  Pulse: 89   Resp: 18 18  Temp: 36.8 C 37 C    Last Pain:  Vitals:   11/04/15 1824  TempSrc:   PainSc: 7                  Linton RumpJennifer Dickerson Willona Phariss

## 2015-11-04 NOTE — Transfer of Care (Signed)
Immediate Anesthesia Transfer of Care Note  Patient: Gilford RileCourtney Quam  Procedure(s) Performed: Procedure(s): PARS PLANA VITRECTOMY WITH 25 GAUGE TO REPAIR A COMPLEX TRACTION RETINAL DETACHMENT (Right)  Patient Location: PACU  Anesthesia Type:General  Level of Consciousness: awake, alert  and oriented  Airway & Oxygen Therapy: Patient Spontanous Breathing  Post-op Assessment: Report given to RN and Post -op Vital signs reviewed and stable  Post vital signs: Reviewed and stable  Last Vitals:  Vitals:   11/04/15 1037  BP: 136/75  Pulse: 98  Resp: 18  Temp: 37.2 C    Last Pain:  Vitals:   11/04/15 1037  TempSrc: Oral  PainSc:       Patients Stated Pain Goal: 5 (11/04/15 0957)  Complications: No apparent anesthesia complications

## 2015-11-04 NOTE — H&P (Signed)
I examined the patient today and there is no change in the medical status 

## 2015-11-04 NOTE — Progress Notes (Signed)
PHARMACIST - PHYSICIAN ORDER COMMUNICATION  CONCERNING: P&T Medication Policy on Herbal Medications  DESCRIPTION:  This patient's order for:  Tumeric  has been noted.  This product(s) is classified as an "herbal" or natural product. Due to a lack of definitive safety studies or FDA approval, nonstandard manufacturing practices, plus the potential risk of unknown drug-drug interactions while on inpatient medications, the Pharmacy and Therapeutics Committee does not permit the use of "herbal" or natural products of this type within [place].   ACTION TAKEN: The pharmacy department is unable to verify this order at this time and your patient has been informed of this safety policy. Please reevaluate patient's clinical condition at discharge and address if the herbal or natural product(s) should be resumed at that time.   

## 2015-11-04 NOTE — Brief Op Note (Signed)
Brief Operative note   Preoperative diagnosis:  vitreous hemmorrhage right eye Postoperative diagnosis  Traction retinal detachment right eye.  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  Repair of complex traction retinal detachment right eye with vitrectomy, laser, membrane peel, gas injection.  Surgeon:  Sherrie GeorgeJohn D Matthews, MD...  Assistant:  Rosalie DoctorLisa Johnson SA    Anesthesia: General  Specimen: none  Estimated blood loss:  1cc  Complications: none  Patient sent to PACU in good condition  Composed by Sherrie GeorgeJohn D Matthews MD  Dictation number: [id number]

## 2015-11-04 NOTE — Anesthesia Procedure Notes (Addendum)
Procedure Name: Intubation Date/Time: 11/04/2015 2:01 PM Performed by: Daiva EvesAVENEL, Sharran Caratachea W Pre-anesthesia Checklist: Patient identified, Emergency Drugs available, Suction available, Patient being monitored and Timeout performed Patient Re-evaluated:Patient Re-evaluated prior to inductionOxygen Delivery Method: Circle system utilized Preoxygenation: Pre-oxygenation with 100% oxygen Intubation Type: IV induction Ventilation: Mask ventilation without difficulty Laryngoscope Size: Mac and 3 Grade View: Grade I Tube type: Oral Tube size: 7.0 mm Number of attempts: 1 Placement Confirmation: ETT inserted through vocal cords under direct vision,  positive ETCO2,  CO2 detector and breath sounds checked- equal and bilateral Secured at: 22 cm Tube secured with: Tape Dental Injury: Teeth and Oropharynx as per pre-operative assessment  Comments: performed by Bronson Ingjohn martinelli crna

## 2015-11-05 ENCOUNTER — Encounter (HOSPITAL_COMMUNITY): Payer: Self-pay | Admitting: Ophthalmology

## 2015-11-05 DIAGNOSIS — E113591 Type 2 diabetes mellitus with proliferative diabetic retinopathy without macular edema, right eye: Secondary | ICD-10-CM | POA: Diagnosis not present

## 2015-11-05 MED ORDER — BACITRACIN-POLYMYXIN B 500-10000 UNIT/GM OP OINT: 1.0000 "application " | TOPICAL_OINTMENT | Freq: Three times a day (TID) | OPHTHALMIC | 0 refills | Status: DC

## 2015-11-05 MED ORDER — PREDNISOLONE ACETATE 1 % OP SUSP: 1.0000 [drp] | Freq: Four times a day (QID) | OPHTHALMIC | 0 refills | Status: DC

## 2015-11-05 MED ORDER — GATIFLOXACIN 0.5 % OP SOLN
1.0000 [drp] | Freq: Four times a day (QID) | OPHTHALMIC | Status: DC
Start: 2015-11-05 — End: 2017-04-12

## 2015-11-05 NOTE — Discharge Summary (Signed)
Discharge summary not needed on [facility] patients per medical records. 

## 2015-11-05 NOTE — Op Note (Signed)
NAMSaul Fordyce:  [name], [name]              ACCOUNT NO.:  [id number]  MEDICAL RECORD NO.:  [id number]  LOCATION:  [id number]                        FACILITY:  [facility]  PHYSICIAN:  [name]. Ashley [name], M.D. DATE OF BIRTH:  03/09/1990  DATE OF PROCEDURE:  11/04/2015 DATE OF DISCHARGE:                              OPERATIVE REPORT   ADMISSION DIAGNOSES:  Proliferative diabetic retinopathy, left eye. Traction retinal detachment with vitreous hemorrhage and proliferative diabetic retinopathy, right eye.  PROCEDURES:  Panretinal photocoagulation, left eye.  Repair of complex traction retinal detachment, right eye, with pars plana vitrectomy, retinal photocoagulation, membrane peel, and gas fluid exchange.  SURGEON:  Alan MulderJohn Mireyah Chervenak, MD  ASSISTANT:  Rosalie DoctorLisa Johnson, SA.  ANESTHESIA:  General.  DESCRIPTION OF PROCEDURE:  After proper endotracheal anesthesia, attention were carried to the left eye where the indirect ophthalmoscope laser was moved into place, 475 burns were placed around the retinal periphery with a power 400 mW, 1000 microns each and 0.1 seconds each. The ointment was placed in eye and it was closed and covered.  Attention was then carried to the right eye where usual prep and drape were performed.  A 25-gauge trocars were placed at 8, 10, and 2 o'clock. Pars plana vitrectomy was begun just behind the pseudophakia.  Dense vitreous hemorrhage was encountered.  This was carefully removed under low suction and rapid cutting scleral depression was used to gain access to the vitreous base for 360 degrees.  The vitrectomy was carried down to the macular surface where membranes were extended from the arcades to the disc and there was traction retinal detachment.  The lighted pick was used to engage the posterior membranes and free them from their attachments to the disc and the macula.  The retina settled.  The vitrectomy was carried into the mid periphery where surface proliferation was removed.   The vitrectomy is carried into the far periphery and the super-wide viewing BIOM was moved into place.  The entire vitreous base was trimmed to 360 degrees removing all blood and vitreous remnants.  The endolaser was positioned in the eye, 842 burns were placed around the retinal periphery.  The power was 300 mW, 0.1 seconds each.  Once this was accomplished, a total gas fluid exchange was carried out.  The instruments were removed from the eye and the 25- gauge trocars were removed from the eye.  The wounds were tested and found to be secure.  Polymyxin and ceftazidime were rinsed around the globe for antibiotic coverage.  Decadron 10 mg was injected into the lower subconjunctival space.  Marcaine was injected around the globe for postop pain.  Closing pressure was 10 with a Baer care tonometer. Polysporin ophthalmic ointment, a patch, and shield were placed.  The patient was awakened and taken to recovery in satisfactory condition.  COMPLICATIONS:  None.  DURATION:  1 hour.     [name]. Ashley [name], M.D.     JDM/MEDQ  D:  11/04/2015  T:  11/05/2015  Job:  [id number]

## 2015-11-05 NOTE — Progress Notes (Signed)
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  Discharge to home.  Give post operative instructions  Left eye:  Clear cornea and anterior segment  Theresa [name], [name] 11/05/2015, 6:15 AM

## 2015-11-11 ENCOUNTER — Ambulatory Visit (INDEPENDENT_AMBULATORY_CARE_PROVIDER_SITE_OTHER): Payer: BLUE CROSS/BLUE SHIELD | Admitting: Ophthalmology

## 2015-11-11 DIAGNOSIS — H4311 Vitreous hemorrhage, right eye: Secondary | ICD-10-CM

## 2015-12-02 ENCOUNTER — Encounter (INDEPENDENT_AMBULATORY_CARE_PROVIDER_SITE_OTHER): Payer: BLUE CROSS/BLUE SHIELD | Admitting: Ophthalmology

## 2015-12-02 DIAGNOSIS — H4311 Vitreous hemorrhage, right eye: Secondary | ICD-10-CM

## 2015-12-05 ENCOUNTER — Ambulatory Visit (INDEPENDENT_AMBULATORY_CARE_PROVIDER_SITE_OTHER): Payer: BLUE CROSS/BLUE SHIELD | Admitting: Ophthalmology

## 2016-01-01 ENCOUNTER — Encounter (INDEPENDENT_AMBULATORY_CARE_PROVIDER_SITE_OTHER): Payer: BLUE CROSS/BLUE SHIELD | Admitting: Ophthalmology

## 2016-01-01 DIAGNOSIS — E10319 Type 1 diabetes mellitus with unspecified diabetic retinopathy without macular edema: Secondary | ICD-10-CM

## 2016-01-01 DIAGNOSIS — H4311 Vitreous hemorrhage, right eye: Secondary | ICD-10-CM

## 2016-01-01 DIAGNOSIS — E103591 Type 1 diabetes mellitus with proliferative diabetic retinopathy without macular edema, right eye: Secondary | ICD-10-CM

## 2016-02-06 ENCOUNTER — Other Ambulatory Visit: Payer: Self-pay | Admitting: Student

## 2016-02-06 ENCOUNTER — Ambulatory Visit
Admission: RE | Admit: 2016-02-06 | Discharge: 2016-02-06 | Disposition: A | Discharge status: Home / Self Care | Payer: BLUE CROSS/BLUE SHIELD | Source: Ambulatory Visit | Attending: Student | Admitting: Student

## 2016-02-06 DIAGNOSIS — M25511 Pain in right shoulder: Secondary | ICD-10-CM

## 2016-02-06 DIAGNOSIS — M25551 Pain in right hip: Secondary | ICD-10-CM

## 2016-03-09 ENCOUNTER — Encounter (INDEPENDENT_AMBULATORY_CARE_PROVIDER_SITE_OTHER): Payer: BLUE CROSS/BLUE SHIELD | Admitting: Ophthalmology

## 2016-03-09 DIAGNOSIS — E103593 Type 1 diabetes mellitus with proliferative diabetic retinopathy without macular edema, bilateral: Secondary | ICD-10-CM | POA: Diagnosis not present

## 2016-03-09 DIAGNOSIS — E10319 Type 1 diabetes mellitus with unspecified diabetic retinopathy without macular edema: Secondary | ICD-10-CM

## 2016-03-09 DIAGNOSIS — H43813 Vitreous degeneration, bilateral: Secondary | ICD-10-CM | POA: Diagnosis not present

## 2016-03-19 ENCOUNTER — Encounter: Payer: BLUE CROSS/BLUE SHIELD | Admitting: Women's Health

## 2016-04-09 ENCOUNTER — Encounter: Payer: Self-pay | Admitting: Women's Health

## 2016-04-09 ENCOUNTER — Ambulatory Visit (INDEPENDENT_AMBULATORY_CARE_PROVIDER_SITE_OTHER): Payer: BLUE CROSS/BLUE SHIELD | Admitting: Women's Health

## 2016-04-09 VITALS — BP 122/78 | Ht 64.0 in | Wt 217.0 lb

## 2016-04-09 DIAGNOSIS — Z01419 Encounter for gynecological examination (general) (routine) without abnormal findings: Secondary | ICD-10-CM

## 2016-04-09 NOTE — Progress Notes (Signed)
Gilford RileCourtney Lapenna 07/13/1990 [id number]    History:    Presents for annual exam.  05/2015 Nexplanon with no bleeding. Type 1 diabetes poor control,  endocrinologist manages.  10/2015 retinal detachment, struggles with peripheral neuropathy and chronic pain. Negative GC/Chlamydia with current partner. Normal Pap history. Gardasil series completed completed.  Past medical history, past surgical history, family history and social history were all reviewed and documented in the EPIC chart. Works in [employer]. Mother died from diabetes in her 4350s. Father unknown, grandmother supportive.  ROS:  A ROS was performed and pertinent positives and negatives are included.  Exam:  Vitals:   04/09/16 1111  BP: 122/78  Weight: 217 lb (98.4 kg)  Height: 5\' 4"  (1.626 m)   Body mass index is 37.25 kg/m.   General appearance:  Normal Thyroid:  Symmetrical, normal in size, without palpable masses or nodularity. Respiratory  Auscultation:  Clear without wheezing or rhonchi Cardiovascular  Auscultation:  Regular rate, without rubs, murmurs or gallops  Edema/varicosities:  Not grossly evident Abdominal  Soft,nontender, without masses, guarding or rebound.  Liver/spleen:  No organomegaly noted  Hernia:  None appreciated  Skin  Inspection:  Numerous skin lesions to legs, back, abdomen   Breasts: Examined lying and sitting.     Right: Without masses, retractions, discharge or axillary adenopathy.     Left: Without masses, retractions, discharge or axillary adenopathy. Gentitourinary   Inguinal/mons:  Normal without inguinal adenopathy  External genitalia:  Normal  BUS/Urethra/Skene's glands:  Normal  Vagina:  Normal  Cervix:  Normal  Uterus:   normal in size, shape and contour.  Midline and mobile  Adnexa/parametria:     Rt: Without masses or tenderness.   Lt: Without masses or tenderness.  Anus and perineum: Normal  Digital rectal exam: Normal sphincter tone without palpated masses or  tenderness  Assessment/Plan:  26 y.o. SBF G1P 0 for annual exam.     05/2015 Nexplanon-amenorrhea Type 1 diabetes with poor control endocrinologist manages [place] [facility] Peripheral neuropathy Obesity Numerous skin lesions-dermatology follow-up recommended  Plan: Aware Nexplanon is good for 3 years. Condoms encouraged until permanent partner. SBEs, continue exercise, calcium rich diet, MVI daily encouraged. Keep scheduled follow-up with endocrinologist. Encouraged nutritionist, decrease carbs and diet. Pap normal 2017, new screening guidelines reviewed.  Marland Kitchen.    Harrington ChallengerYOUNG,Walker Sitar J WHNP, 1:24 PM 04/09/2016

## 2016-04-09 NOTE — Patient Instructions (Signed)
Carbohydrate Counting for Diabetes Mellitus, Adult Carbohydrate counting is a method for keeping track of how many carbohydrates you eat. Eating carbohydrates naturally increases the amount of sugar (glucose) in the blood. Counting how many carbohydrates you eat helps keep your blood glucose within normal limits, which helps you manage your diabetes (diabetes mellitus). It is important to know how many carbohydrates you can safely have in each meal. This is different for every person. A diet and nutrition specialist (registered dietitian) can help you make a meal plan and calculate how many carbohydrates you should have at each meal and snack. Carbohydrates are found in the following foods:  Grains, such as breads and cereals.  Dried beans and soy products.  Starchy vegetables, such as potatoes, peas, and corn.  Fruit and fruit juices.  Milk and yogurt.  Sweets and snack foods, such as cake, cookies, candy, chips, and soft drinks. How do I count carbohydrates? There are two ways to count carbohydrates in food. You can use either of the methods or a combination of both. Reading "Nutrition Facts" on packaged food  The "Nutrition Facts" list is included on the labels of almost all packaged foods and beverages in the [place] It includes:  The serving size.  Information about nutrients in each serving, including the grams (g) of carbohydrate per serving. To use the "Nutrition Facts":  Decide how many servings you will have.  Multiply the number of servings by the number of carbohydrates per serving.  The resulting number is the total amount of carbohydrates that you will be having. Learning standard serving sizes of other foods  When you eat foods containing carbohydrates that are not packaged or do not include "Nutrition Facts" on the label, you need to measure the servings in order to count the amount of carbohydrates:  Measure the foods that you will eat with a food scale or measuring  cup, if needed.  Decide how many standard-size servings you will eat.  Multiply the number of servings by 15. Most carbohydrate-rich foods have about 15 g of carbohydrates per serving.  For example, if you eat 8 oz (170 g) of strawberries, you will have eaten 2 servings and 30 g of carbohydrates (2 servings x 15 g = 30 g).  For foods that have more than one food mixed, such as soups and casseroles, you must count the carbohydrates in each food that is included. The following list contains standard serving sizes of common carbohydrate-rich foods. Each of these servings has about 15 g of carbohydrates:   hamburger bun or  English muffin.   oz (15 mL) syrup.   oz (14 g) jelly.  1 slice of bread.  1 six-inch tortilla.  3 oz (85 g) cooked rice or pasta.  4 oz (113 g) cooked dried beans.  4 oz (113 g) starchy vegetable, such as peas, corn, or potatoes.  4 oz (113 g) hot cereal.  4 oz (113 g) mashed potatoes or  of a large baked potato.  4 oz (113 g) canned or frozen fruit.  4 oz (120 mL) fruit juice.  4-6 crackers.  6 chicken nuggets.  6 oz (170 g) unsweetened dry cereal.  6 oz (170 g) plain fat-free yogurt or yogurt sweetened with artificial sweeteners.  8 oz (240 mL) milk.  8 oz (170 g) fresh fruit or one small piece of fruit.  24 oz (680 g) popped popcorn. Example of carbohydrate counting Sample meal  3 oz (85 g) chicken breast.  6 oz (  170 g) brown rice.  4 oz (113 g) corn.  8 oz (240 mL) milk.  8 oz (170 g) strawberries with sugar-free whipped topping. Carbohydrate calculation 1. Identify the foods that contain carbohydrates:  Rice.  Corn.  Milk.  Strawberries. 2. Calculate how many servings you have of each food:  2 servings rice.  1 serving corn.  1 serving milk.  1 serving strawberries. 3. Multiply each number of servings by 15 g:  2 servings rice x 15 g = 30 g.  1 serving corn x 15 g = 15 g.  1 serving milk x 15 g = 15  g.  1 serving strawberries x 15 g = 15 g. 4. Add together all of the amounts to find the total grams of carbohydrates eaten:  30 g + 15 g + 15 g + 15 g = 75 g of carbohydrates total. This information is not intended to replace advice given to you by your health care provider. Make sure you discuss any questions you have with your health care provider. Document Released: 02/22/2005 Document Revised: 09/12/2015 Document Reviewed: 08/06/2015 Elsevier Interactive Patient Education  2017 [employer] Health Maintenance, Female Introduction Adopting a healthy lifestyle and getting preventive care can go a long way to promote health and wellness. Talk with your health care provider about what schedule of regular examinations is right for you. This is a good chance for you to check in with your provider about disease prevention and staying healthy. In between checkups, there are plenty of things you can do on your own. Experts have done a lot of research about which lifestyle changes and preventive measures are most likely to keep you healthy. Ask your health care provider for more information. Weight and diet Eat a healthy diet  Be sure to include plenty of vegetables, fruits, low-fat dairy products, and lean protein.  Do not eat a lot of foods high in solid fats, added sugars, or salt.  Get regular exercise. This is one of the most important things you can do for your health.  Most adults should exercise for at least 150 minutes each week. The exercise should increase your heart rate and make you sweat (moderate-intensity exercise).  Most adults should also do strengthening exercises at least twice a week. This is in addition to the moderate-intensity exercise. Maintain a healthy weight  Body mass index (BMI) is a measurement that can be used to identify possible weight problems. It estimates body fat based on height and weight. Your health care provider can help determine your BMI and help  you achieve or maintain a healthy weight.  For females 20 years of age and older:  A BMI below 18.5 is considered underweight.  A BMI of 18.5 to 24.9 is normal.  A BMI of 25 to 29.9 is considered overweight.  A BMI of 30 and above is considered obese. Watch levels of cholesterol and blood lipids  You should start having your blood tested for lipids and cholesterol at 27 years of age, then have this test every 5 years.  You may need to have your cholesterol levels checked more often if:  Your lipid or cholesterol levels are high.  You are older than 27 years of age.  You are at high risk for heart disease. Cancer screening Lung Cancer  Lung cancer screening is recommended for adults 55-80 years old who are at high risk for lung cancer because of a history of smoking.  A yearly low-dose CT scan   of the lungs is recommended for people who:  Currently smoke.  Have quit within the past 15 years.  Have at least a 30-pack-year history of smoking. A pack year is smoking an average of one pack of cigarettes a day for 1 year.  Yearly screening should continue until it has been 15 years since you quit.  Yearly screening should stop if you develop a health problem that would prevent you from having lung cancer treatment. Breast Cancer  Practice breast self-awareness. This means understanding how your breasts normally appear and feel.  It also means doing regular breast self-exams. Let your health care provider know about any changes, no matter how small.  If you are in your 20s or 30s, you should have a clinical breast exam (CBE) by a health care provider every 1-3 years as part of a regular health exam.  If you are 40 or older, have a CBE every year. Also consider having a breast X-ray (mammogram) every year.  If you have a family history of breast cancer, talk to your health care provider about genetic screening.  If you are at high risk for breast cancer, talk to your health  care provider about having an MRI and a mammogram every year.  Breast cancer gene (BRCA) assessment is recommended for women who have family members with BRCA-related cancers. BRCA-related cancers include:  Breast.  Ovarian.  Tubal.  Peritoneal cancers.  Results of the assessment will determine the need for genetic counseling and BRCA1 and BRCA2 testing. Cervical Cancer  Your health care provider may recommend that you be screened regularly for cancer of the pelvic organs (ovaries, uterus, and vagina). This screening involves a pelvic examination, including checking for microscopic changes to the surface of your cervix (Pap test). You may be encouraged to have this screening done every 3 years, beginning at age 21.  For women ages 30-65, health care providers may recommend pelvic exams and Pap testing every 3 years, or they may recommend the Pap and pelvic exam, combined with testing for human papilloma virus (HPV), every 5 years. Some types of HPV increase your risk of cervical cancer. Testing for HPV may also be done on women of any age with unclear Pap test results.  Other health care providers may not recommend any screening for nonpregnant women who are considered low risk for pelvic cancer and who do not have symptoms. Ask your health care provider if a screening pelvic exam is right for you.  If you have had past treatment for cervical cancer or a condition that could lead to cancer, you need Pap tests and screening for cancer for at least 20 years after your treatment. If Pap tests have been discontinued, your risk factors (such as having a new sexual partner) need to be reassessed to determine if screening should resume. Some women have medical problems that increase the chance of getting cervical cancer. In these cases, your health care provider may recommend more frequent screening and Pap tests. Colorectal Cancer  This type of cancer can be detected and often prevented.  Routine  colorectal cancer screening usually begins at 27 years of age and continues through 27 years of age.  Your health care provider may recommend screening at an earlier age if you have risk factors for colon cancer.  Your health care provider may also recommend using home test kits to check for hidden blood in the stool.  A small camera at the end of a tube can be used to   examine your colon directly (sigmoidoscopy or colonoscopy). This is done to check for the earliest forms of colorectal cancer.  Routine screening usually begins at age 50.  Direct examination of the colon should be repeated every 5-10 years through 27 years of age. However, you may need to be screened more often if early forms of precancerous polyps or small growths are found. Skin Cancer  Check your skin from head to toe regularly.  Tell your health care provider about any new moles or changes in moles, especially if there is a change in a mole's shape or color.  Also tell your health care provider if you have a mole that is larger than the size of a pencil eraser.  Always use sunscreen. Apply sunscreen liberally and repeatedly throughout the day.  Protect yourself by wearing long sleeves, pants, a wide-brimmed hat, and sunglasses whenever you are outside. Heart disease, diabetes, and high blood pressure  High blood pressure causes heart disease and increases the risk of stroke. High blood pressure is more likely to develop in:  People who have blood pressure in the high end of the normal range (130-139/85-89 mm Hg).  People who are overweight or obese.  People who are African American.  If you are 18-39 years of age, have your blood pressure checked every 3-5 years. If you are 40 years of age or older, have your blood pressure checked every year. You should have your blood pressure measured twice-once when you are at a hospital or clinic, and once when you are not at a hospital or clinic. Record the average of the two  measurements. To check your blood pressure when you are not at a hospital or clinic, you can use:  An automated blood pressure machine at a pharmacy.  A home blood pressure monitor.  If you are between 55 years and 79 years old, ask your health care provider if you should take aspirin to prevent strokes.  Have regular diabetes screenings. This involves taking a blood sample to check your fasting blood sugar level.  If you are at a normal weight and have a low risk for diabetes, have this test once every three years after 27 years of age.  If you are overweight and have a high risk for diabetes, consider being tested at a younger age or more often. Preventing infection Hepatitis B  If you have a higher risk for hepatitis B, you should be screened for this virus. You are considered at high risk for hepatitis B if:  You were born in a country where hepatitis B is common. Ask your health care provider which countries are considered high risk.  Your parents were born in a high-risk country, and you have not been immunized against hepatitis B (hepatitis B vaccine).  You have HIV or AIDS.  You use needles to inject street drugs.  You live with someone who has hepatitis B.  You have had sex with someone who has hepatitis B.  You get hemodialysis treatment.  You take certain medicines for conditions, including cancer, organ transplantation, and autoimmune conditions. Hepatitis C  Blood testing is recommended for:  Everyone born from 1945 through 1965.  Anyone with known risk factors for hepatitis C. Sexually transmitted infections (STIs)  You should be screened for sexually transmitted infections (STIs) including gonorrhea and chlamydia if:  You are sexually active and are younger than 27 years of age.  You are older than 27 years of age and your health care provider tells   you that you are at risk for this type of infection.  Your sexual activity has changed since you were last  screened and you are at an increased risk for chlamydia or gonorrhea. Ask your health care provider if you are at risk.  If you do not have HIV, but are at risk, it may be recommended that you take a prescription medicine daily to prevent HIV infection. This is called pre-exposure prophylaxis (PrEP). You are considered at risk if:  You are sexually active and do not regularly use condoms or know the HIV status of your partner(s).  You take drugs by injection.  You are sexually active with a partner who has HIV. Talk with your health care provider about whether you are at high risk of being infected with HIV. If you choose to begin PrEP, you should first be tested for HIV. You should then be tested every 3 months for as long as you are taking PrEP. Pregnancy  If you are premenopausal and you may become pregnant, ask your health care provider about preconception counseling.  If you may become pregnant, take 400 to 800 micrograms (mcg) of folic acid every day.  If you want to prevent pregnancy, talk to your health care provider about birth control (contraception). Osteoporosis and menopause  Osteoporosis is a disease in which the bones lose minerals and strength with aging. This can result in serious bone fractures. Your risk for osteoporosis can be identified using a bone density scan.  If you are 65 years of age or older, or if you are at risk for osteoporosis and fractures, ask your health care provider if you should be screened.  Ask your health care provider whether you should take a calcium or vitamin D supplement to lower your risk for osteoporosis.  Menopause may have certain physical symptoms and risks.  Hormone replacement therapy may reduce some of these symptoms and risks. Talk to your health care provider about whether hormone replacement therapy is right for you. Follow these instructions at home:  Schedule regular health, dental, and eye exams.  Stay current with your  immunizations.  Do not use any tobacco products including cigarettes, chewing tobacco, or electronic cigarettes.  If you are pregnant, do not drink alcohol.  If you are breastfeeding, limit how much and how often you drink alcohol.  Limit alcohol intake to no more than 1 drink per day for nonpregnant women. One drink equals 12 ounces of beer, 5 ounces of wine, or 1 ounces of hard liquor.  Do not use street drugs.  Do not share needles.  Ask your health care provider for help if you need support or information about quitting drugs.  Tell your health care provider if you often feel depressed.  Tell your health care provider if you have ever been abused or do not feel safe at home. This information is not intended to replace advice given to you by your health care provider. Make sure you discuss any questions you have with your health care provider. Document Released: 09/07/2010 Document Revised: 07/31/2015 Document Reviewed: 11/26/2014  2017 Elsevier  

## 2016-07-20 ENCOUNTER — Encounter: Payer: BLUE CROSS/BLUE SHIELD | Attending: Family Medicine | Admitting: *Deleted

## 2016-07-20 DIAGNOSIS — E109 Type 1 diabetes mellitus without complications: Secondary | ICD-10-CM | POA: Insufficient documentation

## 2016-07-20 DIAGNOSIS — Z713 Dietary counseling and surveillance: Secondary | ICD-10-CM | POA: Insufficient documentation

## 2016-07-20 DIAGNOSIS — E669 Obesity, unspecified: Secondary | ICD-10-CM | POA: Insufficient documentation

## 2016-07-20 NOTE — Patient Instructions (Addendum)
Plan: Aim for 5 units for each Carb Choice(15 grams) for your meals and snacks Your Sliding Scale for high BG is 1 unit for every 50 points you are above 100 mg/dl Consider recording your BG, Carb Choices and insulin doses so we can assess if this is working for you. We discussed Carb Counting by [employer] today as a back up to reading Food Labels Treatment for low BG is 15 grams fast acting carbohydrate such as 4 Glucose Tablets, 4 pieces of sugar candy or 4 oz juice or regular soda. Great job with your exercise!

## 2016-07-23 NOTE — Progress Notes (Signed)
Diabetes Self-Management Education  Visit Type: First/Initial  Appt. Start Time: 1400 Appt. End Time: 1530  07/23/2016  Ms. Gilford Rileourtney Brockwell, identified by name and date of birth, is a 26 y.o. female with a diagnosis of Diabetes: Type 1. She states her pain is being much better on Effexor and she is now going to gym several days a week, feeling much better. She does state she experiences some hypoglycemia with increased exercise and is not comfortable with carb counting yet.  ASSESSMENT  There were no vitals taken for this visit. There is no height or weight on file to calculate BMI.      Diabetes Self-Management Education - 07/20/16 1425      Visit Information   Visit Type First/Initial     Initial Visit   Diabetes Type Type 1   Are you currently following a meal plan? No   Are you taking your medications as prescribed? Yes   Date Diagnosed age 26     Health Coping   How would you rate your overall health? Fair     Psychosocial Assessment   Patient Belief/Attitude about Diabetes Motivated to manage diabetes  and burned out   Other persons present Patient   Patient Concerns Glycemic Control  controlling BG after exercise   How often do you need to have someone help you when you read instructions, pamphlets, or other written materials from your doctor or pharmacy? 1 - Never   What is the last grade level you completed in school? bachelors degree     Pre-Education Assessment   Patient understands incorporating nutritional management into lifestyle. Needs Instruction   Patient undertands incorporating physical activity into lifestyle. Needs Instruction   Patient understands prevention, detection, and treatment of acute complications. Needs Instruction     Complications   Last HgB A1C per patient/outside source 7 %   How often do you check your blood sugar? > 4 times/day   Have you had a dilated eye exam in the past 12 months? Yes   Have you had a dental exam in the past 12  months? Yes   Are you checking your feet? Yes   How many days per week are you checking your feet? 7     Dietary Intake   Breakfast high protein shake with almond milk and strawberries added on days she goes to the gym. OR 1 pkg oatmeal with almond milk     Exercise   Exercise Type Light (walking / raking leaves)  able to go to gym now that her pain is controlled better   How many days per week to you exercise? 5   How many minutes per day do you exercise? 75   Total minutes per week of exercise 375     Patient Education   Previous Diabetes Education Yes (please comment)   Nutrition management  Role of diet in the treatment of diabetes and the relationship between the three main macronutrients and blood glucose level;Food label reading, portion sizes and measuring food.;Carbohydrate counting;Meal timing in regards to the patients' current diabetes medication.   Physical activity and exercise  Identified with patient nutritional and/or medication changes necessary with exercise.   Medications Reviewed patients medication for diabetes, action, purpose, timing of dose and side effects.  intro to insulin to carb ratio options   Acute complications Taught treatment of hypoglycemia - the 15 rule.   Psychosocial adjustment Helped patient identify a support system for diabetes management  DM 1 Support Group  Individualized Goals (developed by patient)   Medications --  consider use of ICR of 5 units per carb choice (15 grams)   Reducing Risk examine blood glucose patterns     Post-Education Assessment   Patient understands incorporating nutritional management into lifestyle. Demonstrates understanding / competency   Patient undertands incorporating physical activity into lifestyle. Demonstrates understanding / competency   Patient understands using medications safely. Demonstrates understanding / competency   Patient understands monitoring blood glucose, interpreting and using results  Demonstrates understanding / competency   Patient understands prevention, detection, and treatment of acute complications. Demonstrates understanding / competency     Outcomes   Expected Outcomes Demonstrated interest in learning. Expect positive outcomes   Future DMSE PRN   Program Status Completed      Individualized Plan for Diabetes Self-Management Training:   Learning Objective:  Patient will have a greater understanding of diabetes self-management. Patient education plan is to attend individual and/or group sessions per assessed needs and concerns.   Plan:   Patient Instructions  Plan: Aim for 5 units for each Carb Choice(15 grams) for your meals and snacks Your Sliding Scale for high BG is 1 unit for every 50 points you are above 100 mg/dl Consider recording your BG, Carb Choices and insulin doses so we can assess if this is working for you. We discussed Carb Counting by [employer] today as a back up to reading Food Labels Treatment for low BG is 15 grams fast acting carbohydrate such as 4 Glucose Tablets, 4 pieces of sugar candy or 4 oz juice or regular soda. Great job with your exercise!    Expected Outcomes:  Demonstrated interest in learning. Expect positive outcomes  Education material provided: Meal plan card and Carbohydrate counting sheet, BG Log Sheet, Hypoglycemia handout, DM 1 Support Group information sheet  If problems or questions, patient to contact team via:  Phone  Future DSME appointment: 1 month

## 2016-08-24 ENCOUNTER — Encounter: Payer: BLUE CROSS/BLUE SHIELD | Attending: Family Medicine | Admitting: *Deleted

## 2016-08-24 DIAGNOSIS — Z713 Dietary counseling and surveillance: Secondary | ICD-10-CM | POA: Insufficient documentation

## 2016-08-24 DIAGNOSIS — E669 Obesity, unspecified: Secondary | ICD-10-CM | POA: Diagnosis not present

## 2016-08-24 DIAGNOSIS — E109 Type 1 diabetes mellitus without complications: Secondary | ICD-10-CM | POA: Insufficient documentation

## 2016-08-24 NOTE — Patient Instructions (Signed)
Plan: Continue to aim for 5 units for each Carb Choice(15 grams) for your meals and snacks (this replaces the 6 units that you were starting with on your Sliding Scale from Dr. Sharl MaKerr) Your Sliding Scale for high BG is 1 unit for every 50 points you are above 100 mg/dl Continue recording your BG, Carb Choices and insulin doses so we can assess if this is working for you as able Continue with your Carb Counting by [employer] today as a back up to reading Food Labels Treatment for low BG is 15 grams fast acting carbohydrate such as 4 Glucose Tablets, 4 pieces of sugar candy or 4 oz juice or regular soda. Great job with your exercise!   We discussed CGM options today. You have indicated you are [facility] and will let Dr. Sharl MaKerr know so he can prescribe for you.

## 2016-08-24 NOTE — Progress Notes (Signed)
Diabetes Self-Management Education  Visit Type:  Follow-up  Appt. Start Time: 1415 Appt. End Time: 1445  08/24/2016  [name], identified by name and date of birth, is a 26 y.o. female with a diagnosis of Diabetes: Type 1.  She states improved confidence with Carb Counting from 2/10 to 8/10. She continues to go to gym 3-5 days a week for 45 minutes ot 2 hours depending on her level of pain with her fibromyalgia. She states she has started using the insulin to carb ratio of 5 units / carb choice (15 grams) which also equals 1 unit/ 3 grams carbohydrate. We discovered today that her Correction of 1 unit / 50 mg/dl above target of [id number] also includes 6 units as starting point, which would allow for her meal. So I will instruct her to not include that 6 units if she is using the Insulin to Carb ratio as that replaces it. She also states an increase in low BG's in the 60's so this change should improve that, we will monitor. She is interested in being weighed on Tanita Scale today when offered so she can see relative proportion of muscle mass and other components to fat.  TANITA  BODY COMP RESULTS 08/24/2016  Weight (lbs 218.8   BMI (kg/m^2) 38.8   Fat Mass (lbs) 111.2   Fat Free Mass (lbs) 107.6   Total Body Water (lbs) 79.0   ASSESSMENT  Height 5\' 3"  (1.6 m), weight 218 lb 12.8 oz (99.2 kg). Body mass index is 38.76 kg/m.       Diabetes Self-Management Education - 08/24/16 1512      Psychosocial Assessment   Patient Belief/Attitude about Diabetes Motivated to manage diabetes   Self-care barriers --  fibromyalgia now under better control and DM 1   Patient Concerns Glycemic Control     Complications   How often do you check your blood sugar? > 4 times/day   Number of hypoglycemic episodes per month 10   Can you tell when your blood sugar is low? Yes  but not until it gets to 60   Have you had a dilated eye exam in the past 12 months? Yes   Have you had a dental exam in the  past 12 months? Yes   Are you checking your feet? Yes   How many days per week are you checking your feet? 7     Exercise   Exercise Type Light (walking / raking leaves)  goes to gym regularly, length of them depends on pain level   How many days per week to you exercise? 4   How many minutes per day do you exercise? 75   Total minutes per week of exercise 300     Patient Education   Previous Diabetes Education Yes (please comment)   Monitoring Other (comment)  Explained overview of CGM and the 3 options available to her     Individualized Goals (developed by patient)   Nutrition Adjust meds/carbs with exercise as discussed   Physical Activity Exercise 3-5 times per week   Medications take my medication as prescribed   Monitoring  --  [facility] Dr. Sharl [facility]err [facility]   Reducing Risk examine blood glucose patterns     Post-Education Assessment   Patient understands incorporating nutritional management into lifestyle. Demonstrates understanding / competency   Patient undertands incorporating physical activity into lifestyle. Demonstrates understanding / competency   Patient understands using medications safely. Demonstrates understanding / competency  Patient understands monitoring blood glucose, interpreting and using results Demonstrates understanding / competency   Patient understands prevention, detection, and treatment of acute complications. Demonstrates understanding / competency     Outcomes   Program Status Completed     Subsequent Visit   Since your last visit have you continued or begun to take your medications as prescribed? Yes   Since your last visit have you experienced any weight changes? Loss   Weight Loss (lbs) 6   Since your last visit, are you checking your blood glucose at least once a day? Yes      Learning Objective:  Patient will have a greater understanding of diabetes self-management. Patient education plan is to attend individual and/or group  sessions per assessed needs and concerns.   Plan:   Patient Instructions  Plan: Continue to aim for 5 units for each Carb Choice(15 grams) for your meals and snacks (this replaces the 6 units that you were starting with on your Sliding Scale from Dr. Sharl Ma) Your Sliding Scale for high BG is 1 unit for every 50 points you are above 100 mg/dl Continue recording your BG, Carb Choices and insulin doses so we can assess if this is working for you as able Continue with your Carb Counting by [employer] today as a back up to reading Food Labels Treatment for low BG is 15 grams fast acting carbohydrate such as 4 Glucose Tablets, 4 pieces of sugar candy or 4 oz juice or regular soda. Great job with your exercise!   We discussed CGM options today. You have indicated you are interested in [facility] and will let Dr. Sharl Ma know so he can prescribe for you.    Expected Outcomes:  Demonstrated interest in learning. Expect positive outcomes  Education material provided: Hand written info on CGM options, their pros and cons, Tanita Scale results  If problems or questions, patient to contact team via:  Phone  Future DSME appointment: - 2 months

## 2016-09-07 ENCOUNTER — Ambulatory Visit (INDEPENDENT_AMBULATORY_CARE_PROVIDER_SITE_OTHER): Payer: BLUE CROSS/BLUE SHIELD | Admitting: Ophthalmology

## 2016-09-23 ENCOUNTER — Ambulatory Visit (INDEPENDENT_AMBULATORY_CARE_PROVIDER_SITE_OTHER): Payer: BLUE CROSS/BLUE SHIELD | Admitting: Ophthalmology

## 2016-09-23 DIAGNOSIS — E103593 Type 1 diabetes mellitus with proliferative diabetic retinopathy without macular edema, bilateral: Secondary | ICD-10-CM

## 2016-09-23 DIAGNOSIS — E10319 Type 1 diabetes mellitus with unspecified diabetic retinopathy without macular edema: Secondary | ICD-10-CM

## 2016-09-23 DIAGNOSIS — H43811 Vitreous degeneration, right eye: Secondary | ICD-10-CM

## 2016-11-03 ENCOUNTER — Ambulatory Visit: Payer: BLUE CROSS/BLUE SHIELD | Admitting: *Deleted

## 2016-11-24 ENCOUNTER — Encounter: Payer: BLUE CROSS/BLUE SHIELD | Attending: Internal Medicine | Admitting: *Deleted

## 2016-11-24 DIAGNOSIS — E109 Type 1 diabetes mellitus without complications: Secondary | ICD-10-CM

## 2016-11-24 DIAGNOSIS — E1042 Type 1 diabetes mellitus with diabetic polyneuropathy: Secondary | ICD-10-CM | POA: Insufficient documentation

## 2016-11-24 NOTE — Progress Notes (Signed)
Insulin Pump and / or CGM Evaluation Visit:  Date: 11/24/2016   Appt start time: 1430 end time:  1530.  Assessment:  This patient has DM 1 and their primary concerns today: She is aware of pumps and CGM. We discussed CGM last viist and she was leaning towards the [place]. She states she and Dr. Sharl Ma decided to try Metformin and see how that worked but she has had severe side effects from it with no apparent improvement in BG's. She would like to discuss pump therapy today to understand it better.Marland Kitchen   MEDICATIONS: Basal Insulin: 40 units of Tresiba at night via pen Bolus Insulin: 5-12 units of Novolog at each meal  via pen  Total Daily Dose of insulin per day 70 units Other diabetes medications: Metformin for now  Progress Towards Obtaining an Insulin Pump Goal(s):   Patient states their expectations of pump therapy include: better BG control and improved discretion in giving her insulin Patient expresses understanding that for improved outcomes for their diabetes on an insulin pump they will:  During the first 1-2 weeks (or more) of new pump use, the patient will test BG pre and 2 hour post each meal, bedtime and 3 AM to assist the provider in evaluating accuracy of pump settings and to prevent DKA   Will test BG at least 4 times per day once evaluation time is completed  Change out pump infusion set or pod at least every 3 days  Upload pump information to their pump's Web Based Program on a regular basis so provider can assess patterns and make setting adjustments.     Intervention:    Taught difference between delivery of insulin via syringe/pen compared to insulin pump.  Demonstrated improved insulin delivery via pump due to improved accuracy of dose and flexibility of adjusting bolus insulin based on carb intake and BG correction.  Showed patient the following pumps: Medtronic, OmniPod, Tandem  Showed patient the following CGM: Medtronic, Dexcom and Libre  Demonstrated pump, insulin  reservoir and infusion set options, and button pushing for bolus delivery of insulin through the pump  Explained importance of testing BG at least 4 times per day for appropriate correction of high BG and prevention of DKA as applicable.  Emphasized importance of follow up after Pump Start for appropriate pump setting adjustments and on-going training on more advanced features.  Discussed current Continuous Glucose Monitoring options   Handouts given during visit include:  Introduction to Pump Therapy Handout  Insulin Pump and /or CGM Packet from [employer] per her request  Patient already receiving DM 1 / Pump Support Group emails and has attended at least one event.   Monitoring/Evaluation:    Patient does not want to continue with pursuit of Ommi Pod insulin pump yet, but is interested in thinking about it..  Patient to contact local Pump Company Rep when ready so they can start the process of obtaining the pump. Contact information provided to the patient.  Once pump / CGM is shipped, patient to call this office to set up training.

## 2017-03-31 ENCOUNTER — Encounter (INDEPENDENT_AMBULATORY_CARE_PROVIDER_SITE_OTHER): Payer: BLUE CROSS/BLUE SHIELD | Admitting: Ophthalmology

## 2017-03-31 DIAGNOSIS — E103512 Type 1 diabetes mellitus with proliferative diabetic retinopathy with macular edema, left eye: Secondary | ICD-10-CM

## 2017-03-31 DIAGNOSIS — H35372 Puckering of macula, left eye: Secondary | ICD-10-CM

## 2017-03-31 DIAGNOSIS — E10311 Type 1 diabetes mellitus with unspecified diabetic retinopathy with macular edema: Secondary | ICD-10-CM | POA: Diagnosis not present

## 2017-03-31 DIAGNOSIS — E103591 Type 1 diabetes mellitus with proliferative diabetic retinopathy without macular edema, right eye: Secondary | ICD-10-CM

## 2017-03-31 DIAGNOSIS — H43812 Vitreous degeneration, left eye: Secondary | ICD-10-CM | POA: Diagnosis not present

## 2017-04-12 ENCOUNTER — Ambulatory Visit (INDEPENDENT_AMBULATORY_CARE_PROVIDER_SITE_OTHER): Payer: BLUE CROSS/BLUE SHIELD | Admitting: Women's Health

## 2017-04-12 ENCOUNTER — Encounter: Payer: Self-pay | Admitting: Women's Health

## 2017-04-12 VITALS — BP 130/78 | Ht 62.0 in | Wt 218.0 lb

## 2017-04-12 DIAGNOSIS — Z01419 Encounter for gynecological examination (general) (routine) without abnormal findings: Secondary | ICD-10-CM

## 2017-04-12 DIAGNOSIS — Z113 Encounter for screening for infections with a predominantly sexual mode of transmission: Secondary | ICD-10-CM | POA: Diagnosis not present

## 2017-04-12 MED ORDER — FLUCONAZOLE 150 MG PO TABS: 150.0000 mg | ORAL_TABLET | Freq: Once | ORAL | 3 refills | Status: AC

## 2017-04-12 NOTE — Progress Notes (Signed)
Theresa RileCourtney [name] 09/28/1990 [id number]    History: 27 yo SBF G1P0 Presents for annual exam with request for STD screen. Same partner for last 3 years, recently notified that he has a fiance who has chlamydia. Last sexual encounter greater than one month ago. 05/2015 Nexplanon with no bleeding. Uses condoms. Type 1 daibetes diagnosed age 27 with poor control, managed by endocrinologist. 2017 - bilateral cataract surgery, bilateral retinal detachment repair, managed by ophthalmologist. 2017- normal Pap, normal Pap history. Gardasil completed. Skin keloids managed by dermatologist. [place] Peripheral neuropathy in feet under control. Weight stable, but has gained 80 pounds from 3 years ago.  Past medical history, past surgical history, family history and social history were all reviewed and documented in the EPIC chart. Graduated [employer]. Recently found out boyfriend of 3 years has a fiance, distraught over breakup, lives alone. 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 Mother died from diabetes in her 1050's. Father unknown, grandmother supportive.   ROS:  A ROS was performed and pertinent positives and negatives are included.  Exam:  Vitals:   04/12/17 1116  BP: 130/78  Weight: 218 lb (98.9 kg)  Height: 5\' 2"  (1.575 m)   Body mass index is 39.87 kg/m.   General appearance:  Normal Thyroid:  Symmetrical, normal in size, without palpable masses or nodularity. Respiratory  Auscultation:  Clear without wheezing or rhonchi Cardiovascular  Auscultation:  Regular rate, without rubs, murmurs or gallops  Edema/varicosities:  Not grossly evident Abdominal  Soft,nontender, without masses, guarding or rebound.  Liver/spleen:  No organomegaly noted  Hernia:  None appreciated  Skin  Inspection:  Keloids of skin on back shoulders and arms.   Breasts: Examined lying and  sitting.     Right: Without masses, retractions, discharge or axillary adenopathy.     Left: Without masses, retractions, discharge or axillary adenopathy. Gentitourinary   Inguinal/mons:  Normal without inguinal adenopathy  External genitalia:  Normal  BUS/Urethra/Skene's glands:  Normal  Vagina:  Normal  Cervix:  Normal  Uterus:  normal in size, shape and contour.  Midline and mobile  Adnexa/parametria:     Rt: Without masses or tenderness.   Lt: Without masses or tenderness.  Anus and perineum: Normal  Digital rectal exam: Not done  Assessment/Plan:  27 y.o.  SBF G1P0 for annual exam.   05/2015 Nexplanon-amenorrhea Type 1 diabetes with poor control - endocrinologist manages Chronic back pain/fibromyalgia  [place] Peripheral neuropathy Obesity Keloids/acne of skin - managed by dermatologist Diabetic Retinopathy - managed by ophthalmologist STD Screen  Plan: Aware that Nexplanon will need to be replace 05/2018, plans to replace with another Nexplanon. Consistent condom use encouraged. Encouarged SBE's, healthy diet, exercise, and weight loss. Given material about low carb diet. Will continue to see endocrinologist, pain management, dermatologist, and ophthalmologist for follow up.  Chlamydia/Gonorrhea screen, Hep B/C, HIV, RPR,  .     Harrington Challengerancy J Lynwood Kubisiak [facility] JerseyWHNP, 11:47 AM 04/12/2017

## 2017-04-12 NOTE — Patient Instructions (Signed)
Carbohydrate Counting for Diabetes Mellitus, Adult Carbohydrate counting is a method for keeping track of how many carbohydrates you eat. Eating carbohydrates naturally increases the amount of sugar (glucose) in the blood. Counting how many carbohydrates you eat helps keep your blood glucose within normal limits, which helps you manage your diabetes (diabetes mellitus). It is important to know how many carbohydrates you can safely have in each meal. This is different for every person. A diet and nutrition specialist (registered dietitian) can help you make a meal plan and calculate how many carbohydrates you should have at each meal and snack. Carbohydrates are found in the following foods:  Grains, such as breads and cereals.  Dried beans and soy products.  Starchy vegetables, such as potatoes, peas, and corn.  Fruit and fruit juices.  Milk and yogurt.  Sweets and snack foods, such as cake, cookies, candy, chips, and soft drinks.  How do I count carbohydrates? There are two ways to count carbohydrates in food. You can use either of the methods or a combination of both. Reading "Nutrition Facts" on packaged food The "Nutrition Facts" list is included on the labels of almost all packaged foods and beverages in the [place] It includes:  The serving size.  Information about nutrients in each serving, including the grams (g) of carbohydrate per serving.  To use the "Nutrition Facts":  Decide how many servings you will have.  Multiply the number of servings by the number of carbohydrates per serving.  The resulting number is the total amount of carbohydrates that you will be having.  Learning standard serving sizes of other foods When you eat foods containing carbohydrates that are not packaged or do not include "Nutrition Facts" on the label, you need to measure the servings in order to count the amount of carbohydrates:  Measure the foods that you will eat with a food scale or  measuring cup, if needed.  Decide how many standard-size servings you will eat.  Multiply the number of servings by 15. Most carbohydrate-rich foods have about 15 g of carbohydrates per serving. ? For example, if you eat 8 oz (170 g) of strawberries, you will have eaten 2 servings and 30 g of carbohydrates (2 servings x 15 g = 30 g).  For foods that have more than one food mixed, such as soups and casseroles, you must count the carbohydrates in each food that is included.  The following list contains standard serving sizes of common carbohydrate-rich foods. Each of these servings has about 15 g of carbohydrates:   hamburger bun or  English muffin.   oz (15 mL) syrup.   oz (14 g) jelly.  1 slice of bread.  1 six-inch tortilla.  3 oz (85 g) cooked rice or pasta.  4 oz (113 g) cooked dried beans.  4 oz (113 g) starchy vegetable, such as peas, corn, or potatoes.  4 oz (113 g) hot cereal.  4 oz (113 g) mashed potatoes or  of a large baked potato.  4 oz (113 g) canned or frozen fruit.  4 oz (120 mL) fruit juice.  4-6 crackers.  6 chicken nuggets.  6 oz (170 g) unsweetened dry cereal.  6 oz (170 g) plain fat-free yogurt or yogurt sweetened with artificial sweeteners.  8 oz (240 mL) milk.  8 oz (170 g) fresh fruit or one small piece of fruit.  24 oz (680 g) popped popcorn.  Example of carbohydrate counting Sample meal  3 oz (85 g) chicken breast.    6 oz (170 g) brown rice.  4 oz (113 g) corn.  8 oz (240 mL) milk.  8 oz (170 g) strawberries with sugar-free whipped topping. Carbohydrate calculation 1. Identify the foods that contain carbohydrates: ? Rice. ? Corn. ? Milk. ? Strawberries. 2. Calculate how many servings you have of each food: ? 2 servings rice. ? 1 serving corn. ? 1 serving milk. ? 1 serving strawberries. 3. Multiply each number of servings by 15 g: ? 2 servings rice x 15 g = 30 g. ? 1 serving corn x 15 g = 15 g. ? 1 serving milk x 15  g = 15 g. ? 1 serving strawberries x 15 g = 15 g. 4. Add together all of the amounts to find the total grams of carbohydrates eaten: ? 30 g + 15 g + 15 g + 15 g = 75 g of carbohydrates total. This information is not intended to replace advice given to you by your health care provider. Make sure you discuss any questions you have with your health care provider. Document Released: 02/22/2005 Document Revised: 09/12/2015 Document Reviewed: 08/06/2015 Elsevier Interactive Patient Education  2018 [place] Maintenance, Female Adopting a healthy lifestyle and getting preventive care can go a long way to promote health and wellness. Talk with your health care provider about what schedule of regular examinations is right for you. This is a good chance for you to check in with your provider about disease prevention and staying healthy. In between checkups, there are plenty of things you can do on your own. Experts have done a lot of research about which lifestyle changes and preventive measures are most likely to keep you healthy. Ask your health care provider for more information. Weight and diet Eat a healthy diet  Be sure to include plenty of vegetables, fruits, low-fat dairy products, and lean protein.  Do not eat a lot of foods high in solid fats, added sugars, or salt.  Get regular exercise. This is one of the most important things you can do for your health. ? Most adults should exercise for at least 150 minutes each week. The exercise should increase your heart rate and make you sweat (moderate-intensity exercise). ? Most adults should also do strengthening exercises at least twice a week. This is in addition to the moderate-intensity exercise.  Maintain a healthy weight  Body mass index (BMI) is a measurement that can be used to identify possible weight problems. It estimates body fat based on height and weight. Your health care provider can help determine your BMI and help you  achieve or maintain a healthy weight.  For females 76 years of age and older: ? A BMI below 18.5 is considered underweight. ? A BMI of 18.5 to 24.9 is normal. ? A BMI of 25 to 29.9 is considered overweight. ? A BMI of 30 and above is considered obese.  Watch levels of cholesterol and blood lipids  You should start having your blood tested for lipids and cholesterol at 28 years of age, then have this test every 5 years.  You may need to have your cholesterol levels checked more often if: ? Your lipid or cholesterol levels are high. ? You are older than 28 years of age. ? You are at high risk for heart disease.  Cancer screening Lung Cancer  Lung cancer screening is recommended for adults 52-25 years old who are at high risk for lung cancer because of a history of smoking.  A  yearly low-dose CT scan of the lungs is recommended for people who: ? Currently smoke. ? Have quit within the past 15 years. ? Have at least a 30-pack-year history of smoking. A pack year is smoking an average of one pack of cigarettes a day for 1 year.  Yearly screening should continue until it has been 15 years since you quit.  Yearly screening should stop if you develop a health problem that would prevent you from having lung cancer treatment.  Breast Cancer  Practice breast self-awareness. This means understanding how your breasts normally appear and feel.  It also means doing regular breast self-exams. Let your health care provider know about any changes, no matter how small.  If you are in your 20s or 30s, you should have a clinical breast exam (CBE) by a health care provider every 1-3 years as part of a regular health exam.  If you are 4 or older, have a CBE every year. Also consider having a breast X-ray (mammogram) every year.  If you have a family history of breast cancer, talk to your health care provider about genetic screening.  If you are at high risk for breast cancer, talk to your health  care provider about having an MRI and a mammogram every year.  Breast cancer gene (BRCA) assessment is recommended for women who have family members with BRCA-related cancers. BRCA-related cancers include: ? Breast. ? Ovarian. ? Tubal. ? Peritoneal cancers.  Results of the assessment will determine the need for genetic counseling and BRCA1 and BRCA2 testing.  Cervical Cancer Your health care provider may recommend that you be screened regularly for cancer of the pelvic organs (ovaries, uterus, and vagina). This screening involves a pelvic examination, including checking for microscopic changes to the surface of your cervix (Pap test). You may be encouraged to have this screening done every 3 years, beginning at age 12.  For women ages 13-65, health care providers may recommend pelvic exams and Pap testing every 3 years, or they may recommend the Pap and pelvic exam, combined with testing for human papilloma virus (HPV), every 5 years. Some types of HPV increase your risk of cervical cancer. Testing for HPV may also be done on women of any age with unclear Pap test results.  Other health care providers may not recommend any screening for nonpregnant women who are considered low risk for pelvic cancer and who do not have symptoms. Ask your health care provider if a screening pelvic exam is right for you.  If you have had past treatment for cervical cancer or a condition that could lead to cancer, you need Pap tests and screening for cancer for at least 20 years after your treatment. If Pap tests have been discontinued, your risk factors (such as having a new sexual partner) need to be reassessed to determine if screening should resume. Some women have medical problems that increase the chance of getting cervical cancer. In these cases, your health care provider may recommend more frequent screening and Pap tests.  Colorectal Cancer  This type of cancer can be detected and often  prevented.  Routine colorectal cancer screening usually begins at 28 years of age and continues through 28 years of age.  Your health care provider may recommend screening at an earlier age if you have risk factors for colon cancer.  Your health care provider may also recommend using home test kits to check for hidden blood in the stool.  A small camera at the end of  a tube can be used to examine your colon directly (sigmoidoscopy or colonoscopy). This is done to check for the earliest forms of colorectal cancer.  Routine screening usually begins at age 45.  Direct examination of the colon should be repeated every 5-10 years through 29 years of age. However, you may need to be screened more often if early forms of precancerous polyps or small growths are found.  Skin Cancer  Check your skin from head to toe regularly.  Tell your health care provider about any new moles or changes in moles, especially if there is a change in a mole's shape or color.  Also tell your health care provider if you have a mole that is larger than the size of a pencil eraser.  Always use sunscreen. Apply sunscreen liberally and repeatedly throughout the day.  Protect yourself by wearing long sleeves, pants, a wide-brimmed hat, and sunglasses whenever you are outside.  Heart disease, diabetes, and high blood pressure  High blood pressure causes heart disease and increases the risk of stroke. High blood pressure is more likely to develop in: ? People who have blood pressure in the high end of the normal range (130-139/85-89 mm Hg). ? People who are overweight or obese. ? People who are African American.  If you are 65-44 years of age, have your blood pressure checked every 3-5 years. If you are 35 years of age or older, have your blood pressure checked every year. You should have your blood pressure measured twice-once when you are at a hospital or clinic, and once when you are not at a hospital or clinic.  Record the average of the two measurements. To check your blood pressure when you are not at a hospital or clinic, you can use: ? An automated blood pressure machine at a pharmacy. ? A home blood pressure monitor.  If you are between 55 years and 4 years old, ask your health care provider if you should take aspirin to prevent strokes.  Have regular diabetes screenings. This involves taking a blood sample to check your fasting blood sugar level. ? If you are at a normal weight and have a low risk for diabetes, have this test once every three years after 28 years of age. ? If you are overweight and have a high risk for diabetes, consider being tested at a younger age or more often. Preventing infection Hepatitis B  If you have a higher risk for hepatitis B, you should be screened for this virus. You are considered at high risk for hepatitis B if: ? You were born in a country where hepatitis B is common. Ask your health care provider which countries are considered high risk. ? Your parents were born in a high-risk country, and you have not been immunized against hepatitis B (hepatitis B vaccine). ? You have HIV or AIDS. ? You use needles to inject street drugs. ? You live with someone who has hepatitis B. ? You have had sex with someone who has hepatitis B. ? You get hemodialysis treatment. ? You take certain medicines for conditions, including cancer, organ transplantation, and autoimmune conditions.  Hepatitis C  Blood testing is recommended for: ? Everyone born from [birth year] through 1966. ? Anyone with known risk factors for hepatitis C.  Sexually transmitted infections (STIs)  You should be screened for sexually transmitted infections (STIs) including gonorrhea and chlamydia if: ? You are sexually active and are younger than 28 years of age. ? You are older than  29 years of age and your health care provider tells you that you are at risk for this type of infection. ? Your sexual  activity has changed since you were last screened and you are at an increased risk for chlamydia or gonorrhea. Ask your health care provider if you are at risk.  If you do not have HIV, but are at risk, it may be recommended that you take a prescription medicine daily to prevent HIV infection. This is called pre-exposure prophylaxis (PrEP). You are considered at risk if: ? You are sexually active and do not regularly use condoms or know the HIV status of your partner(s). ? You take drugs by injection. ? You are sexually active with a partner who has HIV.  Talk with your health care provider about whether you are at high risk of being infected with HIV. If you choose to begin PrEP, you should first be tested for HIV. You should then be tested every 3 months for as long as you are taking PrEP. Pregnancy  If you are premenopausal and you may become pregnant, ask your health care provider about preconception counseling.  If you may become pregnant, take 400 to 800 micrograms (mcg) of folic acid every day.  If you want to prevent pregnancy, talk to your health care provider about birth control (contraception). Osteoporosis and menopause  Osteoporosis is a disease in which the bones lose minerals and strength with aging. This can result in serious bone fractures. Your risk for osteoporosis can be identified using a bone density scan.  If you are 29 years of age or older, or if you are at risk for osteoporosis and fractures, ask your health care provider if you should be screened.  Ask your health care provider whether you should take a calcium or vitamin D supplement to lower your risk for osteoporosis.  Menopause may have certain physical symptoms and risks.  Hormone replacement therapy may reduce some of these symptoms and risks. Talk to your health care provider about whether hormone replacement therapy is right for you. Follow these instructions at home:  Schedule regular health, dental,  and eye exams.  Stay current with your immunizations.  Do not use any tobacco products including cigarettes, chewing tobacco, or electronic cigarettes.  If you are pregnant, do not drink alcohol.  If you are breastfeeding, limit how much and how often you drink alcohol.  Limit alcohol intake to no more than 1 drink per day for nonpregnant women. One drink equals 12 ounces of beer, 5 ounces of wine, or 1 ounces of hard liquor.  Do not use street drugs.  Do not share needles.  Ask your health care provider for help if you need support or information about quitting drugs.  Tell your health care provider if you often feel depressed.  Tell your health care provider if you have ever been abused or do not feel safe at home. This information is not intended to replace advice given to you by your health care provider. Make sure you discuss any questions you have with your health care provider. Document Released: 09/07/2010 Document Revised: 07/31/2015 Document Reviewed: 11/26/2014 Elsevier Interactive Patient Education  [employer].

## 2017-04-13 LAB — C. TRACHOMATIS/N. GONORRHOEAE RNA
C. trachomatis RNA, TMA: NOT DETECTED
N. gonorrhoeae RNA, TMA: NOT DETECTED

## 2017-04-15 LAB — URINE CULTURE
MICRO NUMBER:: 90153799
SPECIMEN QUALITY:: ADEQUATE

## 2017-04-15 LAB — URINALYSIS, COMPLETE W/RFL CULTURE
Bilirubin Urine: NEGATIVE
Hyaline Cast: NONE SEEN /LPF
Ketones, ur: NEGATIVE
Leukocyte Esterase: NEGATIVE
Nitrites, Initial: POSITIVE — AB
Protein, ur: NEGATIVE
RBC / HPF: NONE SEEN /HPF (ref 0–2)
Specific Gravity, Urine: 1.033 (ref 1.001–1.03)
pH: 5.5 (ref 5.0–8.0)

## 2017-04-15 LAB — CULTURE INDICATED

## 2017-04-18 ENCOUNTER — Telehealth: Payer: Self-pay

## 2017-04-18 ENCOUNTER — Other Ambulatory Visit: Payer: Self-pay | Admitting: Women's Health

## 2017-04-18 MED ORDER — NITROFURANTOIN MONOHYD MACRO 100 MG PO CAPS: 100.0000 mg | ORAL_CAPSULE | Freq: Two times a day (BID) | ORAL | 0 refills | Status: DC

## 2017-04-18 NOTE — Telephone Encounter (Signed)
There were no allergy warnings with the Macrobid so I sent Rx for her. Patient advised.

## 2017-04-18 NOTE — Telephone Encounter (Signed)
I did not see an allergy for Septra, if she is sensitive to that Macrobid twice daily for 7 days take with food.

## 2017-04-18 NOTE — Telephone Encounter (Signed)
Theresa [name], see attached order and the adverse reaction/allergy and advise.

## 2017-04-18 NOTE — Telephone Encounter (Signed)
-----   Message from Harrington ChallengerNancy J Young, NP sent at 04/15/2017  4:02 PM EST ----- Please call and review urine pos for UTI, septra bid for 3 days.

## 2017-06-22 ENCOUNTER — Ambulatory Visit (INDEPENDENT_AMBULATORY_CARE_PROVIDER_SITE_OTHER): Payer: BLUE CROSS/BLUE SHIELD | Admitting: Women's Health

## 2017-06-22 ENCOUNTER — Encounter: Payer: Self-pay | Admitting: Women's Health

## 2017-06-22 VITALS — BP 132/90 | Ht 63.0 in | Wt 219.2 lb

## 2017-06-22 DIAGNOSIS — N83201 Unspecified ovarian cyst, right side: Secondary | ICD-10-CM

## 2017-06-22 DIAGNOSIS — N926 Irregular menstruation, unspecified: Secondary | ICD-10-CM

## 2017-06-22 MED ORDER — MEGESTROL ACETATE 40 MG PO TABS: 40.0000 mg | ORAL_TABLET | Freq: Two times a day (BID) | ORAL | 1 refills | Status: DC

## 2017-06-22 NOTE — Progress Notes (Signed)
27 year old SBF G1P0 presents with light irregular bleeding for the last 2 months  follow-up of right ovarian cyst.  Nexplanon placed 05/2015,  amenorrhea prior to February.  Was seen at primary care for irregular bleeding and abdominal pain and was found to have a 4.2 cm simple right ovarian cyst.  Endometrium 2.5 mm.  Has not been sexually active in greater than 3 months,  denies need for STD screen.  Type I diabetic poor control.  Reports abdominal pain much  less today.  Denies urinary symptoms, vaginal discharge, itching, odor or fever.  Exam: Appears well.  Abdomen soft, nontender, obese.  External genitalia within normal limits, speculum exam scant light brown bleeding, bimanual no CMT or adnexal tenderness.  4.2 simple right ovarian cyst 06/08/2017 05/2015 nexplanon irregular light bleeding for 2 months Type 1 diabetes poor control  Plan: Reassurance given regarding normality of exam and light bleeding, reviewed simple cyst is functional will resolve on its own.  Reviewed options of just watching or possibly Megace.  Prescription for Megace p.o. twice daily for 10 days if needed if bleeding becomes heavy again.  Otherwise  will just watch at this time.

## 2017-09-30 ENCOUNTER — Encounter (INDEPENDENT_AMBULATORY_CARE_PROVIDER_SITE_OTHER): Payer: BLUE CROSS/BLUE SHIELD | Admitting: Ophthalmology

## 2017-10-13 ENCOUNTER — Encounter (INDEPENDENT_AMBULATORY_CARE_PROVIDER_SITE_OTHER): Payer: BLUE CROSS/BLUE SHIELD | Admitting: Ophthalmology

## 2017-10-21 ENCOUNTER — Emergency Department (HOSPITAL_COMMUNITY): Payer: BLUE CROSS/BLUE SHIELD

## 2017-10-21 ENCOUNTER — Ambulatory Visit (HOSPITAL_COMMUNITY)
Admission: EM | Admit: 2017-10-21 | Discharge: 2017-10-21 | Disposition: A | Discharge status: Home / Self Care | Payer: BLUE CROSS/BLUE SHIELD | Source: Home / Self Care

## 2017-10-21 ENCOUNTER — Telehealth: Payer: Self-pay | Admitting: *Deleted

## 2017-10-21 ENCOUNTER — Other Ambulatory Visit: Payer: Self-pay

## 2017-10-21 ENCOUNTER — Emergency Department (HOSPITAL_COMMUNITY)
Admission: EM | Admit: 2017-10-21 | Discharge: 2017-10-22 | Disposition: A | Discharge status: Home / Self Care | Payer: BLUE CROSS/BLUE SHIELD | Attending: Emergency Medicine | Admitting: Emergency Medicine

## 2017-10-21 ENCOUNTER — Encounter (HOSPITAL_COMMUNITY): Payer: Self-pay

## 2017-10-21 DIAGNOSIS — R52 Pain, unspecified: Secondary | ICD-10-CM

## 2017-10-21 DIAGNOSIS — E109 Type 1 diabetes mellitus without complications: Secondary | ICD-10-CM | POA: Diagnosis not present

## 2017-10-21 DIAGNOSIS — R1030 Lower abdominal pain, unspecified: Secondary | ICD-10-CM | POA: Insufficient documentation

## 2017-10-21 DIAGNOSIS — Z794 Long term (current) use of insulin: Secondary | ICD-10-CM | POA: Diagnosis not present

## 2017-10-21 DIAGNOSIS — E103593 Type 1 diabetes mellitus with proliferative diabetic retinopathy without macular edema, bilateral: Secondary | ICD-10-CM | POA: Insufficient documentation

## 2017-10-21 DIAGNOSIS — R102 Pelvic and perineal pain: Secondary | ICD-10-CM | POA: Diagnosis not present

## 2017-10-21 DIAGNOSIS — Z79899 Other long term (current) drug therapy: Secondary | ICD-10-CM | POA: Insufficient documentation

## 2017-10-21 DIAGNOSIS — N939 Abnormal uterine and vaginal bleeding, unspecified: Secondary | ICD-10-CM | POA: Diagnosis present

## 2017-10-21 LAB — COMPREHENSIVE METABOLIC PANEL
ALT: 14 U/L (ref 0–44)
AST: 16 U/L (ref 15–41)
Albumin: 3.3 g/dL — ABNORMAL LOW (ref 3.5–5.0)
Alkaline Phosphatase: 99 U/L (ref 38–126)
Anion gap: 10 (ref 5–15)
BUN: 12 mg/dL (ref 6–20)
CO2: 24 mmol/L (ref 22–32)
Calcium: 8.6 mg/dL — ABNORMAL LOW (ref 8.9–10.3)
Chloride: 103 mmol/L (ref 98–111)
Creatinine, Ser: 0.81 mg/dL (ref 0.44–1.00)
GFR calc Af Amer: >60 mL/min (ref >60)
GFR calc non Af Amer: >60 mL/min (ref >60)
Glucose, Bld: 185 mg/dL — ABNORMAL HIGH (ref 70–99)
Potassium: 4.1 mmol/L (ref 3.5–5.1)
Sodium: 137 mmol/L (ref 135–145)
Total Bilirubin: 0.6 mg/dL (ref 0.3–1.2)
Total Protein: 7.3 g/dL (ref 6.5–8.1)

## 2017-10-21 LAB — I-STAT BETA HCG BLOOD, ED (MC, WL, AP ONLY): I-stat hCG, quantitative: <5 m[IU]/mL (ref <5)

## 2017-10-21 LAB — URINALYSIS, ROUTINE W REFLEX MICROSCOPIC
Bilirubin Urine: NEGATIVE
Glucose, UA: 50 mg/dL — AB
Ketones, ur: 20 mg/dL — AB
Leukocytes, UA: NEGATIVE
Nitrite: NEGATIVE
Protein, ur: NEGATIVE mg/dL
Specific Gravity, Urine: 1.018 (ref 1.005–1.030)
pH: 7 (ref 5.0–8.0)

## 2017-10-21 LAB — CBC
HCT: 35.1 % — ABNORMAL LOW (ref 36.0–46.0)
Hemoglobin: 11.6 g/dL — ABNORMAL LOW (ref 12.0–15.0)
MCH: 29.7 pg (ref 26.0–34.0)
MCHC: 33 g/dL (ref 30.0–36.0)
MCV: 89.8 fL (ref 78.0–100.0)
Platelets: 358 10*3/uL (ref 150–400)
RBC: 3.91 MIL/uL (ref 3.87–5.11)
RDW: 13.8 % (ref 11.5–15.5)
WBC: 6.8 10*3/uL (ref 4.0–10.5)

## 2017-10-21 LAB — WET PREP, GENITAL
Clue Cells Wet Prep HPF POC: NONE SEEN
Sperm: NONE SEEN
Trich, Wet Prep: NONE SEEN
Yeast Wet Prep HPF POC: NONE SEEN

## 2017-10-21 LAB — LIPASE, BLOOD: Lipase: 19 U/L (ref 11–51)

## 2017-10-21 LAB — CBG MONITORING, ED: Glucose-Capillary: 169 mg/dL — ABNORMAL HIGH (ref 70–99)

## 2017-10-21 MED ORDER — DIPHENHYDRAMINE HCL 50 MG/ML IJ SOLN
12.5000 mg | Freq: Once | INTRAMUSCULAR | Status: AC
  Administered 2017-10-21: 12.5 mg via INTRAVENOUS
  Filled 2017-10-21: qty 1

## 2017-10-21 MED ORDER — SODIUM CHLORIDE 0.9 % IV BOLUS
1000.0000 mL | Freq: Once | INTRAVENOUS | Status: AC
  Administered 2017-10-21: 1000 mL via INTRAVENOUS

## 2017-10-21 MED ORDER — ONDANSETRON HCL 4 MG/2ML IJ SOLN
4.0000 mg | Freq: Once | INTRAMUSCULAR | Status: AC
  Administered 2017-10-21: 4 mg via INTRAVENOUS
  Filled 2017-10-21: qty 2

## 2017-10-21 MED ORDER — IOPAMIDOL (ISOVUE-300) INJECTION 61%
100.0000 mL | Freq: Once | INTRAVENOUS | Status: AC | PRN
  Administered 2017-10-21: 100 mL via INTRAVENOUS

## 2017-10-21 MED ORDER — IOPAMIDOL (ISOVUE-300) INJECTION 61%
INTRAVENOUS | Status: AC
  Filled 2017-10-21: qty 100

## 2017-10-21 MED ORDER — FENTANYL CITRATE (PF) 100 MCG/2ML IJ SOLN
50.0000 ug | Freq: Once | INTRAMUSCULAR | Status: AC
  Administered 2017-10-21: 50 ug via INTRAVENOUS
  Filled 2017-10-21: qty 2

## 2017-10-21 MED ORDER — PROCHLORPERAZINE EDISYLATE 10 MG/2ML IJ SOLN
10.0000 mg | Freq: Once | INTRAMUSCULAR | Status: AC
  Administered 2017-10-21: 10 mg via INTRAVENOUS
  Filled 2017-10-21: qty 2

## 2017-10-21 MED ORDER — LACTATED RINGERS IV BOLUS
1000.0000 mL | Freq: Once | INTRAVENOUS | Status: AC
  Administered 2017-10-21: 1000 mL via INTRAVENOUS

## 2017-10-21 NOTE — ED Triage Notes (Signed)
Pt arrived via POV. Pt is AOx4 and Ambulatory. Pt chief complaint is lower abdominal pain and nausea that has been going on since last Friday. Pt also stated she has been having intermittent vaginal bleeding.

## 2017-10-21 NOTE — ED Notes (Signed)
Pt driven to ER by mother.

## 2017-10-21 NOTE — ED Provider Notes (Signed)
[facility]-EMERGENCY DEPT Provider Note   CSN: [id number] Arrival date & time: 10/21/17  1700     History   Chief Complaint Chief Complaint  Patient presents with  . Abdominal Pain  . Nausea    HPI Theresa [name] is a 27 y.o. female.  27 y/o female with a PMH of Fibromyalgia, Headache,DM I and Insomnia presents to the ED with a chief complaint of of vaginal bleeding x 1 week. Patient reports the bleeding has been having, changing 4 large pads a day along with clots.She states she gets spotting with her nexplanon this time the bleeding has been heavier. She reports lowe abdominal cramping along with nausea. Patient was seen by her PCP yesterday in which they ordered a pelvic US which was normal. She states the symptoms have worsen and feel like the time she had a cyst rupture. Patient has a previous history of left ovarian cyst. She denies any fever, chest pain or shortness of breath.    Abdominal Pain   Associated symptoms include nausea and vomiting. Pertinent negatives include fever, diarrhea, dysuria, hematuria and arthralgias.    Past Medical History:  Diagnosis Date  . Fibromyalgia   . Headache   . Insomnia   . Insulin dependent type 1 diabetes mellitus (HCC)   . Peripheral neuropathy   . Seasonal allergies   . 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 Proliferative diabetic retinopathy of both eyes (HCC) 11/04/2015  . Chronic pain 09/29/2013  . Nausea with vomiting 09/29/2013  . DKA (diabetic ketoacidoses) (HCC) 07/26/2013  . Type 1 diabetes mellitus (HCC) 11/22/2011  . Yeast vaginitis 11/22/2011    Past Surgical History:  Procedure Laterality Date  . CATARACT EXTRACTION W/ INTRAOCULAR LENS  IMPLANT, BILATERAL Bilateral 05/2014 - 06/2014  . CYST EXCISION Right 07/2013   Excision of chronically  infected right neck cyst with culture Hattie Perch 07/11/2013  . EYE SURGERY    . MASS EXCISION Right 07/10/2013   Procedure: MINOR EXCISION RIGHT NECK CYST;  Surgeon: Drema Halon, MD;  Location: [facility];  Service: ENT;  Laterality: Right;  . Nexplanon  05/07/2015  . PARS PLANA REPAIR OF RETINAL DEATACHMENT Right 11/04/2015  . PARS PLANA VITRECTOMY Right 11/04/2015   Procedure: PARS PLANA VITRECTOMY WITH 25 GAUGE TO REPAIR A COMPLEX TRACTION RETINAL DETACHMENT;  Surgeon: Sherrie George, MD;  Location: [facility] OR;  Service: Ophthalmology;  Laterality: Right;  . WISDOM TOOTH EXTRACTION  09/2015     OB History    Gravida  1   Para      Term      Preterm      AB  1   Living  0     SAB  1   TAB      Ectopic      Multiple      Live Births               Home Medications    Prior to Admission medications   Medication Sig Start Date End Date Taking? Authorizing Provider  ALPRAZolam Prudy Feeler) 0.5 MG tablet Take 0.5 mg by mouth at bedtime as needed for anxiety.   Yes [provider]  BELSOMRA 15 MG TABS Take 15 mg by mouth at bedtime as needed for sleep. 09/06/17  Yes [provider]  cyclobenzaprine (FLEXERIL) 5 MG tablet Take 5 mg by mouth 3 (three) times daily as needed for muscle spasms.   Yes [provider]  etonogestrel (NEXPLANON) 68 MG IMPL implant 1 each by Subdermal route once. March 2017   Yes [provider]  insulin aspart (NOVOLOG FLEXPEN) 100 UNIT/ML FlexPen Inject 5-20 Units into the skin 3 (three) times daily with meals. Sliding scale   Yes [provider]  Insulin Degludec (TRESIBA FLEXTOUCH) 100 UNIT/ML SOPN Inject 55 Units into the skin daily.  12/14/14  Yes [provider]  oxyCODONE-acetaminophen (PERCOCET/ROXICET) 5-325 MG tablet Take 1 tablet by mouth every 6 (six) hours as needed for moderate pain or severe pain.   Yes [provider]  Turmeric (RA TURMERIC) 500 MG CAPS Take 300 mg  by mouth daily.   Yes [provider]  venlafaxine (EFFEXOR) 75 MG tablet Take 75 mg by mouth daily.  01/22/15  Yes [provider]  Vitamin D, Ergocalciferol, (DRISDOL) 50000 units CAPS capsule Take 50,000 Units by mouth once a week. 05/24/17  Yes [provider]  megestrol (MEGACE) 40 MG tablet Take 1 tablet (40 mg total) by mouth 2 (two) times daily. Patient not taking: Reported on 10/21/2017 06/22/17   Harrington Challenger, NP  nitrofurantoin, macrocrystal-monohydrate, (MACROBID) 100 MG capsule Take 1 capsule (100 mg total) by mouth 2 (two) times daily with a meal. Patient not taking: Reported on 06/22/2017 04/18/17   Harrington Challenger, NP    Family History Family History  Problem Relation Age of Onset  . Diabetes Mother   . Migraines Mother   . Hypertension Mother   . CVA Mother   . Breast cancer Maternal Grandmother   . Thyroid disease Maternal Grandmother   . Cancer Maternal Grandfather     Social History Social History   Tobacco Use  . Smoking status: Never Smoker  . Smokeless tobacco: Never Used  Substance Use Topics  . Alcohol use: No    Alcohol/week: 0.0 standard drinks  . Drug use: No     Allergies   Aloe; Duloxetine; Gabapentin; Olive oil; Lyrica [pregabalin]; Tapentadol; and Zonisamide   Review of Systems Review of Systems  Constitutional: Negative for chills and fever.  HENT: Negative for ear pain and sore throat.   Eyes: Negative for pain and visual disturbance.  Respiratory: Negative for cough and shortness of breath.   Cardiovascular: Negative for chest pain and palpitations.  Gastrointestinal: Positive for abdominal pain, nausea and vomiting. Negative for diarrhea.  Genitourinary: Positive for pelvic pain and vaginal bleeding. Negative for dysuria, flank pain and hematuria.  Musculoskeletal: Negative for arthralgias and back pain.  Skin: Negative for color change and rash.  Neurological: Negative for seizures and syncope.  All other  systems reviewed and are negative.    Physical Exam Updated Vital Signs BP 128/74 (BP Location: Left Arm)   Pulse (!) 112   Temp 99.1 F (37.3 C) (Oral)   Resp 20   Ht 5\' 3"  (1.6 m)   Wt 99.8 kg   SpO2 98%   BMI 38.97 kg/m   Physical Exam  Constitutional: She is oriented to person, place, and time. She appears well-developed and well-nourished. No distress.  HENT:  Head: Normocephalic and atraumatic.  Mouth/Throat: Oropharynx is clear and moist. No oropharyngeal exudate.  Eyes: Pupils are equal, round, and reactive to light.  Neck: Normal range of motion.  Cardiovascular: Regular rhythm and normal heart sounds. Tachycardia present.  Pulmonary/Chest: Effort normal and breath  sounds normal. No respiratory distress.  Abdominal: Soft. Bowel sounds are normal. She exhibits no distension. There is tenderness in the right lower quadrant, suprapubic area and left lower quadrant. There is no rigidity, no guarding, no CVA tenderness, no tenderness at McBurney's point and negative Murphy's sign.  Genitourinary: Pelvic exam was performed with patient supine. Cervix exhibits motion tenderness. Cervix exhibits no discharge. Right adnexum displays tenderness. Left adnexum displays tenderness. There is bleeding in the vagina. No erythema in the vagina. Vaginal discharge found.  Genitourinary Comments: There is cervical motion tenderness BL, mild bleeding seen in the vaginal vault. White discharge seen along with cervix.  Pelvic chaperone by Cicero Duck NT.  Musculoskeletal: She exhibits no tenderness or deformity.       Right lower leg: She exhibits no edema.       Left lower leg: She exhibits no edema.  Neurological: She is alert and oriented to person, place, and time.  Skin: Skin is warm and dry.  Psychiatric: She has a normal mood and affect.  Nursing note and vitals reviewed.    ED Treatments / Results  Labs (all labs ordered are listed, but only abnormal results are displayed) Labs  Reviewed  WET PREP, GENITAL - Abnormal; Notable for the following components:      Result Value   WBC, Wet Prep HPF POC FEW (*)    All other components within normal limits  COMPREHENSIVE METABOLIC PANEL - Abnormal; Notable for the following components:   Glucose, Bld 185 (*)    Calcium 8.6 (*)    Albumin 3.3 (*)    All other components within normal limits  CBC - Abnormal; Notable for the following components:   Hemoglobin 11.6 (*)    HCT 35.1 (*)    All other components within normal limits  URINALYSIS, ROUTINE W REFLEX MICROSCOPIC - Abnormal; Notable for the following components:   Color, Urine AMBER (*)    Glucose, UA 50 (*)    Hgb urine dipstick SMALL (*)    Ketones, ur 20 (*)    Bacteria, UA RARE (*)    All other components within normal limits  CBG MONITORING, ED - Abnormal; Notable for the following components:   Glucose-Capillary 169 (*)    All other components within normal limits  LIPASE, BLOOD  I-STAT BETA HCG BLOOD, ED ([facility], [facility], [facility] ONLY)  GC/CHLAMYDIA PROBE AMP () NOT AT [facility]    EKG None  Radiology US Pelvic Doppler (torsion R/o Or Mass Arterial Flow)  Result Date: 10/21/2017 CLINICAL DATA:  Pelvic pain for 1 week. EXAM: TRANSABDOMINAL AND TRANSVAGINAL ULTRASOUND OF PELVIS DOPPLER ULTRASOUND OF OVARIES TECHNIQUE: Both transabdominal and transvaginal ultrasound examinations of the pelvis were performed. Transabdominal technique was performed for global imaging of the pelvis including uterus, ovaries, adnexal regions, and pelvic cul-de-sac. It was necessary to proceed with endovaginal exam following the transabdominal exam to visualize the adnexal structures. Color and duplex Doppler ultrasound was utilized to evaluate blood flow to the ovaries. COMPARISON:  None. FINDINGS: Uterus Measurements: 5.5 x 3.5 x 4.8 cm. No fibroids or other mass visualized. Endometrium Thickness: 7 mm.  No focal abnormality visualized. Right ovary Measurements: 2.4 x 1.6 x 2.3 cm.  Normal appearance/no adnexal mass. Left ovary Measurements: 4.1 x 2.4 x 4.3 cm. Normal appearance/no adnexal mass. There is a 2.8 x 2.0 x 2.3 cm cyst within the left ovary. Pulsed Doppler evaluation of both ovaries demonstrates normal low-resistance arterial and venous waveforms. Other findings Trace fluid in the  pelvis. Within the right aspect of the pelvis, between the uterus and the right ovary, there is a large area of peripheral echogenicity with posterior acoustic shadowing, nonspecific in etiology. IMPRESSION: 1. Within the right hemipelvis between the right ovary and right aspect of the uterus there is a large area with peripheral increased echogenicity and posterior acoustic shadowing. This is nonspecific in etiology. This may represent a large bowel loop however the possibility of pelvic mass or dermoid are not excluded. Recommend further evaluation with contrast-enhanced CT of the abdomen and pelvis. Electronically Signed   By: Annia Beltrew  Davis M.D.   On: 10/21/2017 22:39   [place] Pelvic Complete With Transvaginal  Result Date: 10/21/2017 CLINICAL DATA:  Pelvic pain for 1 week. EXAM: TRANSABDOMINAL AND TRANSVAGINAL ULTRASOUND OF PELVIS DOPPLER ULTRASOUND OF OVARIES TECHNIQUE: Both transabdominal and transvaginal ultrasound examinations of the pelvis were performed. Transabdominal technique was performed for global imaging of the pelvis including uterus, ovaries, adnexal regions, and pelvic cul-de-sac. It was necessary to proceed with endovaginal exam following the transabdominal exam to visualize the adnexal structures. Color and duplex Doppler ultrasound was utilized to evaluate blood flow to the ovaries. COMPARISON:  None. FINDINGS: Uterus Measurements: 5.5 x 3.5 x 4.8 cm. No fibroids or other mass visualized. Endometrium Thickness: 7 mm.  No focal abnormality visualized. Right ovary Measurements: 2.4 x 1.6 x 2.3 cm. Normal appearance/no adnexal mass. Left ovary Measurements: 4.1 x 2.4 x 4.3 cm. Normal  appearance/no adnexal mass. There is a 2.8 x 2.0 x 2.3 cm cyst within the left ovary. Pulsed Doppler evaluation of both ovaries demonstrates normal low-resistance arterial and venous waveforms. Other findings Trace fluid in the pelvis. Within the right aspect of the pelvis, between the uterus and the right ovary, there is a large area of peripheral echogenicity with posterior acoustic shadowing, nonspecific in etiology. IMPRESSION: 1. Within the right hemipelvis between the right ovary and right aspect of the uterus there is a large area with peripheral increased echogenicity and posterior acoustic shadowing. This is nonspecific in etiology. This may represent a large bowel loop however the possibility of pelvic mass or dermoid are not excluded. Recommend further evaluation with contrast-enhanced CT of the abdomen and pelvis. Electronically Signed   By: Annia Beltrew  Davis M.D.   On: 10/21/2017 22:39    Procedures Procedures (including critical care time)  Medications Ordered in ED Medications  lactated ringers bolus 1,000 mL (1,000 mLs Intravenous New Bag/Given 10/21/17 2239)  sodium chloride 0.9 % bolus 1,000 mL ( Intravenous Rate/Dose Change 10/21/17 2253)  iopamidol (ISOVUE-300) 61 % injection (has no administration in time range)  ondansetron (ZOFRAN) injection 4 mg (4 mg Intravenous Given 10/21/17 1946)  ondansetron (ZOFRAN) injection 4 mg (4 mg Intravenous Given 10/21/17 2052)  prochlorperazine (COMPAZINE) injection 10 mg (10 mg Intravenous Given 10/21/17 2205)  diphenhydrAMINE (BENADRYL) injection 12.5 mg (12.5 mg Intravenous Given 10/21/17 2204)  fentaNYL (SUBLIMAZE) injection 50 mcg (50 mcg Intravenous Given 10/21/17 2244)  iopamidol (ISOVUE-300) 61 % injection 100 mL (100 mLs Intravenous Contrast Given 10/21/17 2258)     Initial Impression / Assessment and Plan / ED Course  I have reviewed the triage vital signs and the nursing notes.  Pertinent labs & imaging results that were available during my  care of the patient were reviewed by me and considered in my medical decision making (see chart for details).     Patient presents to the ED with lower abdominal pain. Patient was seen yesterday by her PCP who ordered a  US abdomen which was negative for any acute finding.Patient states the symptoms have worsen over the past 24 hours. Patient upon evaluation complains of lower abdomen patient generalized. Patient upon arrival is tachycardic at 112~.   CMP showed Hgb 11.6 which is greater than previous level recorded. The CBC showed no leukocytosis, patient is tender along suprapubic area and along lower quadrant. UA showed some rare bacteria.No nitrites, no leukocytes patient is not having urinary symptoms.  Pelvic examination chaperoned by Cicero DuckErika NT showed bleeding along vaginal vault along with white discharge.   US Pelvic transvaginal showed no torsion. However, cyst present on left ovary.US results warranted further imaging at this time will order a CT ABD&Pelvis to r/o any appendicitis, dermoid , or pelvic mass.   Patient has received fluids, zofran and compazine, she is currently not actively vomiting states she feels somewhat better.  CT ABD&Pelvis showed Wall thickening of the ascending and transverse colon most compatible with colitis. There is a possible hepatic adenoma. Dr. Ollen Bargesuratola has discusse these results with patient and she is advised to follow up with PCP for a possible MRI of her liver. At this time patient is stable and would like to go home.  I have prescribed metronidazole + cipro to treat her colitis. Patient is advised to follow up with PCP for reevaluation of symptoms after antibiotic treatment. Return precautions provided. Vitals are stable for discharge.    Final Clinical Impressions(s) / ED Diagnoses   Final diagnoses:  Lower abdominal pain  Pain    ED Discharge Orders    None       Claude MangesSoto, Tiffany Calmes, PA-C 10/22/17 0015    Virgina Norfolkuratolo, Adam, DO 10/22/17 [phone]

## 2017-10-21 NOTE — ED Triage Notes (Signed)
Pt presents with complaints of lower abdominal pain and emesis. States that she has been seen by her PCP and is feeling worse.  Pt also endorses vaginal bleeding. Pt referred to the ER for further evaluation. Pt given copay back due to not seeing provider or having any interventions done here.

## 2017-10-21 NOTE — Telephone Encounter (Signed)
Patient called c/o severe lower quad pain per patient. Has Nexplanon and right ovary cyst, c/o bleeding with clots, cycle started on 10/14/17, no heavy, c/o intense back pain, nausea and episodes at times of vomiting. Saw PCP on 10/20/17 negative UPT, urine, she is not sexually active. I suggested if pain is severe to go to ER to be seen. Since no ultrasound tech in office and at ER she will be able to have any imaging needed . Patient verbalized she understood.

## 2017-10-21 NOTE — ED Notes (Signed)
Pt needs full bladder for transvaginal US, will obtain u/a after US performed. Erick ColaceMelinda P Ociel Retherford, RN

## 2017-10-22 MED ORDER — METRONIDAZOLE 500 MG PO TABS: 500.0000 mg | ORAL_TABLET | Freq: Three times a day (TID) | ORAL | 0 refills | Status: DC

## 2017-10-22 MED ORDER — ONDANSETRON HCL 4 MG PO TABS: 4.0000 mg | ORAL_TABLET | Freq: Four times a day (QID) | ORAL | 0 refills | Status: DC

## 2017-10-22 MED ORDER — CIPROFLOXACIN HCL 500 MG PO TABS: 500.0000 mg | ORAL_TABLET | Freq: Two times a day (BID) | ORAL | 0 refills | Status: DC

## 2017-10-22 NOTE — Discharge Instructions (Signed)
I have prescribed antibiotics please take as directed for 7 days. Please follow up with PCP for reevaluation of your symptoms within 1 week.If you experience any fever or your symptoms worsen please return for reeval. If you experience any of the following symptoms please return to the ED:  Your pain does not go away as soon as your health care provider told you to expect. You cannot stop throwing up. Your pain is only in areas of the abdomen, such as the right side or the left lower portion of the abdomen. You have bloody or black stools, or stools that look like tar. You have severe pain, cramping, or bloating in your abdomen. You have signs of dehydration, such as: Dark urine, very little urine, or no urine. Cracked lips. Dry mouth. Sunken eyes. Sleepiness. Weakness.

## 2017-10-24 ENCOUNTER — Ambulatory Visit: Payer: BLUE CROSS/BLUE SHIELD | Admitting: Women's Health

## 2017-10-24 LAB — GC/CHLAMYDIA PROBE AMP (~~LOC~~) NOT AT ARMC
Chlamydia: NEGATIVE
Neisseria Gonorrhea: NEGATIVE

## 2017-10-25 ENCOUNTER — Encounter (HOSPITAL_COMMUNITY): Payer: Self-pay | Admitting: *Deleted

## 2017-10-25 ENCOUNTER — Other Ambulatory Visit: Payer: Self-pay

## 2017-10-25 ENCOUNTER — Observation Stay (HOSPITAL_COMMUNITY)
Admission: EM | Admit: 2017-10-25 | Discharge: 2017-10-26 | Disposition: A | Discharge status: Home / Self Care | Payer: BLUE CROSS/BLUE SHIELD | Attending: Family Medicine | Admitting: Family Medicine

## 2017-10-25 DIAGNOSIS — M797 Fibromyalgia: Secondary | ICD-10-CM | POA: Insufficient documentation

## 2017-10-25 DIAGNOSIS — Z794 Long term (current) use of insulin: Secondary | ICD-10-CM | POA: Diagnosis not present

## 2017-10-25 DIAGNOSIS — Z8249 Family history of ischemic heart disease and other diseases of the circulatory system: Secondary | ICD-10-CM | POA: Diagnosis not present

## 2017-10-25 DIAGNOSIS — R3129 Other microscopic hematuria: Secondary | ICD-10-CM | POA: Diagnosis not present

## 2017-10-25 DIAGNOSIS — F418 Other specified anxiety disorders: Secondary | ICD-10-CM | POA: Diagnosis present

## 2017-10-25 DIAGNOSIS — E876 Hypokalemia: Secondary | ICD-10-CM | POA: Diagnosis not present

## 2017-10-25 DIAGNOSIS — E1042 Type 1 diabetes mellitus with diabetic polyneuropathy: Secondary | ICD-10-CM | POA: Diagnosis not present

## 2017-10-25 DIAGNOSIS — Z823 Family history of stroke: Secondary | ICD-10-CM | POA: Diagnosis not present

## 2017-10-25 DIAGNOSIS — K769 Liver disease, unspecified: Secondary | ICD-10-CM | POA: Diagnosis present

## 2017-10-25 DIAGNOSIS — E1043 Type 1 diabetes mellitus with diabetic autonomic (poly)neuropathy: Principal | ICD-10-CM | POA: Insufficient documentation

## 2017-10-25 DIAGNOSIS — F419 Anxiety disorder, unspecified: Secondary | ICD-10-CM | POA: Insufficient documentation

## 2017-10-25 DIAGNOSIS — K3184 Gastroparesis: Secondary | ICD-10-CM | POA: Insufficient documentation

## 2017-10-25 DIAGNOSIS — E860 Dehydration: Secondary | ICD-10-CM | POA: Diagnosis not present

## 2017-10-25 DIAGNOSIS — G8929 Other chronic pain: Secondary | ICD-10-CM | POA: Diagnosis not present

## 2017-10-25 DIAGNOSIS — E109 Type 1 diabetes mellitus without complications: Secondary | ICD-10-CM | POA: Diagnosis present

## 2017-10-25 DIAGNOSIS — R112 Nausea with vomiting, unspecified: Secondary | ICD-10-CM | POA: Diagnosis present

## 2017-10-25 LAB — COMPREHENSIVE METABOLIC PANEL
ALT: 13 U/L (ref 0–44)
AST: 27 U/L (ref 15–41)
Albumin: 3.8 g/dL (ref 3.5–5.0)
Alkaline Phosphatase: 96 U/L (ref 38–126)
Anion gap: 16 — ABNORMAL HIGH (ref 5–15)
BUN: 14 mg/dL (ref 6–20)
CO2: 30 mmol/L (ref 22–32)
Calcium: 9 mg/dL (ref 8.9–10.3)
Chloride: 92 mmol/L — ABNORMAL LOW (ref 98–111)
Creatinine, Ser: 0.76 mg/dL (ref 0.44–1.00)
GFR calc Af Amer: >60 mL/min (ref >60)
GFR calc non Af Amer: >60 mL/min (ref >60)
Glucose, Bld: 286 mg/dL — ABNORMAL HIGH (ref 70–99)
Potassium: 2.9 mmol/L — ABNORMAL LOW (ref 3.5–5.1)
Sodium: 138 mmol/L (ref 135–145)
Total Bilirubin: 2.6 mg/dL — ABNORMAL HIGH (ref 0.3–1.2)
Total Protein: 8 g/dL (ref 6.5–8.1)

## 2017-10-25 LAB — CBC
HCT: 36.7 % (ref 36.0–46.0)
Hemoglobin: 12.1 g/dL (ref 12.0–15.0)
MCH: 29.8 pg (ref 26.0–34.0)
MCHC: 33 g/dL (ref 30.0–36.0)
MCV: 90.4 fL (ref 78.0–100.0)
Platelets: 401 10*3/uL — ABNORMAL HIGH (ref 150–400)
RBC: 4.06 MIL/uL (ref 3.87–5.11)
RDW: 13.6 % (ref 11.5–15.5)
WBC: 8.9 10*3/uL (ref 4.0–10.5)

## 2017-10-25 LAB — BLOOD GAS, VENOUS
Acid-Base Excess: 6.8 mmol/L — ABNORMAL HIGH (ref 0.0–2.0)
Bicarbonate: 31.3 mmol/L — ABNORMAL HIGH (ref 20.0–28.0)
O2 Saturation: 50.4 %
Patient temperature: 98.6
pCO2, Ven: 45.4 mmHg (ref 44.0–60.0)
pH, Ven: 7.453 — ABNORMAL HIGH (ref 7.250–7.430)

## 2017-10-25 LAB — URINALYSIS, ROUTINE W REFLEX MICROSCOPIC
Bilirubin Urine: NEGATIVE
Glucose, UA: >=500 mg/dL — AB
Ketones, ur: 80 mg/dL — AB
Leukocytes, UA: NEGATIVE
Nitrite: NEGATIVE
Protein, ur: 100 mg/dL — AB
RBC / HPF: >50 RBC/hpf — ABNORMAL HIGH (ref 0–5)
Specific Gravity, Urine: 1.026 (ref 1.005–1.030)
pH: 6 (ref 5.0–8.0)

## 2017-10-25 LAB — I-STAT BETA HCG BLOOD, ED (MC, WL, AP ONLY): I-stat hCG, quantitative: <5 m[IU]/mL (ref <5)

## 2017-10-25 LAB — LIPASE, BLOOD: Lipase: 20 U/L (ref 11–51)

## 2017-10-25 LAB — I-STAT CG4 LACTIC ACID, ED: Lactic Acid, Venous: 1.24 mmol/L (ref 0.5–1.9)

## 2017-10-25 MED ORDER — ONDANSETRON HCL 4 MG/2ML IJ SOLN
4.0000 mg | Freq: Once | INTRAMUSCULAR | Status: AC
  Administered 2017-10-25: 4 mg via INTRAVENOUS
  Filled 2017-10-25: qty 2

## 2017-10-25 MED ORDER — SODIUM CHLORIDE 0.9 % IV BOLUS
1000.0000 mL | Freq: Once | INTRAVENOUS | Status: AC
  Administered 2017-10-25: 1000 mL via INTRAVENOUS

## 2017-10-25 MED ORDER — MORPHINE SULFATE (PF) 4 MG/ML IV SOLN
4.0000 mg | Freq: Once | INTRAVENOUS | Status: AC
  Administered 2017-10-25: 4 mg via INTRAVENOUS
  Filled 2017-10-25: qty 1

## 2017-10-25 NOTE — ED Provider Notes (Signed)
[facility]-EMERGENCY DEPT Provider Note   CSN: [id number] Arrival date & time: 10/25/17  2100     History   Chief Complaint Chief Complaint  Patient presents with  . Emesis    HPI Theresa [name] is a 27 y.o. female.  Patient presents to the emergency department with chief complaint of nausea and vomiting and generalized abdominal pain.  She is type I diabetic, and is insulin controlled.  She states that she was seen 3 days ago and was diagnosed with colitis, and was discharged home with Flagyl and Cipro.  She reports that she has been unable to take the medication because she has been vomiting.  She denies any fevers or chills.  She states that anything she eats or drinks she immediately vomits back up.  She denies any dysuria, hematuria, or vaginal discharge.  The history is provided by the patient. No language interpreter was used.    Past Medical History:  Diagnosis Date  . Fibromyalgia   . Headache   . Insomnia   . Insulin dependent type 1 diabetes mellitus (HCC)   . Peripheral neuropathy   . Seasonal allergies   . 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 Proliferative diabetic retinopathy of both eyes (HCC) 11/04/2015  . Chronic pain 09/29/2013  . Nausea with vomiting 09/29/2013  . DKA (diabetic ketoacidoses) (HCC) 07/26/2013  . Type 1 diabetes mellitus (HCC) 11/22/2011  . Yeast vaginitis 11/22/2011    Past Surgical History:  Procedure Laterality Date  . CATARACT EXTRACTION W/ INTRAOCULAR LENS  IMPLANT, BILATERAL Bilateral 05/2014 - 06/2014  . 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 EYE SURGERY    . MASS EXCISION Right 07/10/2013   Procedure: MINOR EXCISION RIGHT NECK CYST;  Surgeon: Drema Halon, MD;  Location: [facility];  Service: ENT;  Laterality: Right;  . Nexplanon  05/07/2015  . PARS PLANA REPAIR OF RETINAL DEATACHMENT Right 11/04/2015  . PARS PLANA VITRECTOMY Right 11/04/2015   Procedure: PARS PLANA VITRECTOMY WITH 25 GAUGE TO REPAIR A COMPLEX TRACTION RETINAL DETACHMENT;  Surgeon: Sherrie George, MD;  Location: [facility] OR;  Service: Ophthalmology;  Laterality: Right;  . WISDOM TOOTH EXTRACTION  09/2015     OB History    Gravida  1   Para      Term      Preterm      AB  1   Living  0     SAB  1   TAB      Ectopic      Multiple      Live Births               Home Medications    Prior to Admission medications   Medication Sig Start Date End Date Taking? Authorizing Provider  ALPRAZolam Prudy Feeler) 0.5 MG tablet Take 0.5 mg by mouth at bedtime as needed for anxiety.    [provider]  BELSOMRA 15 MG TABS Take 15 mg by mouth at bedtime as needed for sleep. 09/06/17   [provider]  ciprofloxacin (CIPRO) 500 MG tablet Take 1 tablet (500 mg total) by mouth 2 (two) times daily for 7 days. 10/22/17 10/29/17  Claude Manges, PA-C  cyclobenzaprine (FLEXERIL) 5 MG tablet Take  5 mg by mouth 3 (three) times daily as needed for muscle spasms.    [provider]  etonogestrel (NEXPLANON) 68 MG IMPL implant 1 each by Subdermal route once. March 2017    [provider]  insulin aspart (NOVOLOG FLEXPEN) 100 UNIT/ML FlexPen Inject 5-20 Units into the skin 3 (three) times daily with meals. Sliding scale    [provider]  Insulin Degludec (TRESIBA FLEXTOUCH) 100 UNIT/ML SOPN Inject 55 Units into the skin daily.  12/14/14   [provider]  megestrol (MEGACE) 40 MG tablet Take 1 tablet (40 mg total) by mouth 2 (two) times daily. Patient not taking: Reported on 10/21/2017 06/22/17   Harrington Challenger, NP  metroNIDAZOLE (FLAGYL) 500 MG tablet Take 1 tablet (500 mg total) by mouth 3 (three) times daily for 7 days. 10/22/17 10/29/17  Claude Manges,  PA-C  nitrofurantoin, macrocrystal-monohydrate, (MACROBID) 100 MG capsule Take 1 capsule (100 mg total) by mouth 2 (two) times daily with a meal. Patient not taking: Reported on 06/22/2017 04/18/17   Harrington Challenger, NP  ondansetron (ZOFRAN) 4 MG tablet Take 1 tablet (4 mg total) by mouth every 6 (six) hours for 7 days. 10/22/17 10/29/17  Claude Manges, PA-C  oxyCODONE-acetaminophen (PERCOCET/ROXICET) 5-325 MG tablet Take 1 tablet by mouth every 6 (six) hours as needed for moderate pain or severe pain.    [provider]  Turmeric (RA TURMERIC) 500 MG CAPS Take 300 mg by mouth daily.    [provider]  venlafaxine (EFFEXOR) 75 MG tablet Take 75 mg by mouth daily.  01/22/15   [provider]  Vitamin D, Ergocalciferol, (DRISDOL) 50000 units CAPS capsule Take 50,000 Units by mouth once a week. 05/24/17   [provider]    Family History Family History  Problem Relation Age of Onset  . Diabetes Mother   . Migraines Mother   . Hypertension Mother   . CVA Mother   . Breast cancer Maternal Grandmother   . Thyroid disease Maternal Grandmother   . Cancer Maternal Grandfather     Social History Social History   Tobacco Use  . Smoking status: Never Smoker  . Smokeless tobacco: Never Used  Substance Use Topics  . Alcohol use: No    Alcohol/week: 0.0 standard drinks  . Drug use: No     Allergies   Aloe; Duloxetine; Gabapentin; Olive oil; Lyrica [pregabalin]; Tapentadol; and Zonisamide   Review of Systems Review of Systems  All other systems reviewed and are negative.    Physical Exam Updated Vital Signs BP (!) 146/94 (BP Location: Right Arm)   Pulse 98   Temp 98.4 F (36.9 C) (Oral)   Resp 16   SpO2 100%   Physical Exam  Constitutional: She is oriented to person, place, and time. She appears well-developed and well-nourished.  HENT:  Head: Normocephalic and atraumatic.  Eyes: Pupils are equal, round, and reactive to light. Conjunctivae and  EOM are normal.  Neck: Normal range of motion. Neck supple.  Cardiovascular: Normal rate and regular rhythm. Exam reveals no gallop and no friction rub.  No murmur heard. Pulmonary/Chest: Effort normal and breath sounds normal. No respiratory distress. She has no wheezes. She has no rales. She exhibits no tenderness.  Abdominal: Soft. Bowel sounds are normal. She exhibits no distension and no mass. There is no tenderness. There is no rebound and no guarding.  Musculoskeletal: Normal range of motion. She exhibits no edema or tenderness.  Neurological: She is alert and  oriented to person, place, and time.  Skin: Skin is warm and dry.  Psychiatric: She has a normal mood and affect. Her behavior is normal. Judgment and thought content normal.  Nursing note and vitals reviewed.    ED Treatments / Results  Labs (all labs ordered are listed, but only abnormal results are displayed) Labs Reviewed  COMPREHENSIVE METABOLIC PANEL - Abnormal; Notable for the following components:      Result Value   Potassium 2.9 (*)    Chloride 92 (*)    Glucose, Bld 286 (*)    Total Bilirubin 2.6 (*)    Anion gap 16 (*)    All other components within normal limits  CBC - Abnormal; Notable for the following components:   Platelets 401 (*)    All other components within normal limits  URINALYSIS, ROUTINE W REFLEX MICROSCOPIC - Abnormal; Notable for the following components:   Glucose, UA >=500 (*)    Hgb urine dipstick MODERATE (*)    Ketones, ur 80 (*)    Protein, ur 100 (*)    RBC / HPF >50 (*)    Bacteria, UA RARE (*)    All other components within normal limits  BLOOD GAS, VENOUS - Abnormal; Notable for the following components:   pH, Ven 7.453 (*)    Bicarbonate 31.3 (*)    Acid-Base Excess 6.8 (*)    All other components within normal limits  LIPASE, BLOOD  HIV ANTIBODY (ROUTINE TESTING)  COMPREHENSIVE METABOLIC PANEL  CBC WITH DIFFERENTIAL/PLATELET  I-STAT BETA HCG BLOOD, ED ([facility], [facility], [facility] ONLY)   I-STAT CG4 LACTIC ACID, ED    EKG None  Radiology No results found.  Procedures Procedures (including critical care time)  Medications Ordered in ED Medications  sodium chloride 0.9 % bolus 1,000 mL (has no administration in time range)  morphine 4 MG/ML injection 4 mg (has no administration in time range)  ondansetron (ZOFRAN) injection 4 mg (has no administration in time range)     Initial Impression / Assessment and Plan / ED Course  I have reviewed the triage vital signs and the nursing notes.  Pertinent labs & imaging results that were available during my care of the patient were reviewed by me and considered in my medical decision making (see chart for details).  Clinical Course as of Oct 27 138  Wed Oct 26, 2017  0013 CO2: 30 [PC]    Clinical Course User Index [PC] Nira Connardama, Pedro Eduardo, MD    Patient with nausea and vomiting and generalized abdominal pain.  She was seen 3 days ago for the same and was diagnosed with colitis.  She was treated with Cipro and Flagyl, but has been unable to keep this down due to her persistent vomiting.  She has not had fever or an elevated white blood cell count.  She does have a mild anion gap of 16, but is not acidotic by VBG.  Her potassium is 2.9, which is likely low secondary to diarrhea.  She does not have a elevated lactic acid.  She does have ketones in her urine.  She states that this does not feel like DKA however.  Due to patient's persistent nausea and vomiting, dehydration, and neurolysed abdominal discomfort, I do not feel that she would be able to tolerate continued outpatient therapy.  Patient discussed with Dr. Eudelia Bunchardama, who agrees with the plan.  Appreciate Dr. Antionette Charpyd for bringing patient in for monitoring in the hospital.  Final Clinical Impressions(s) / ED Diagnoses  Final diagnoses:  Intractable vomiting with nausea, unspecified vomiting type    ED Discharge Orders    None       Roxy HorsemanBrowning, Nohely Whitehorn,  PA-C 10/26/17 0143    Benjiman CorePickering, Nathan, MD 11/01/17 [phone]

## 2017-10-25 NOTE — ED Triage Notes (Signed)
Pt reports severe nausea/vomiting and mild diarrhea. She c/o burning in her throat from vomiting. She says she was seen here for the same last week and just not feeling better. She is unable to take prescribed meds because of vomiting. C/o mild upper abdominal pain.

## 2017-10-26 ENCOUNTER — Other Ambulatory Visit: Payer: Self-pay

## 2017-10-26 ENCOUNTER — Encounter (HOSPITAL_COMMUNITY): Payer: Self-pay | Admitting: Family Medicine

## 2017-10-26 ENCOUNTER — Encounter (INDEPENDENT_AMBULATORY_CARE_PROVIDER_SITE_OTHER): Payer: BLUE CROSS/BLUE SHIELD | Admitting: Ophthalmology

## 2017-10-26 DIAGNOSIS — F418 Other specified anxiety disorders: Secondary | ICD-10-CM | POA: Diagnosis present

## 2017-10-26 DIAGNOSIS — K769 Liver disease, unspecified: Secondary | ICD-10-CM | POA: Diagnosis not present

## 2017-10-26 DIAGNOSIS — F419 Anxiety disorder, unspecified: Secondary | ICD-10-CM | POA: Diagnosis not present

## 2017-10-26 DIAGNOSIS — E876 Hypokalemia: Secondary | ICD-10-CM | POA: Diagnosis present

## 2017-10-26 DIAGNOSIS — E1040 Type 1 diabetes mellitus with diabetic neuropathy, unspecified: Secondary | ICD-10-CM

## 2017-10-26 DIAGNOSIS — R112 Nausea with vomiting, unspecified: Secondary | ICD-10-CM | POA: Diagnosis not present

## 2017-10-26 LAB — COMPREHENSIVE METABOLIC PANEL
ALT: 18 U/L (ref 0–44)
AST: 25 U/L (ref 15–41)
Albumin: 3.3 g/dL — ABNORMAL LOW (ref 3.5–5.0)
Alkaline Phosphatase: 85 U/L (ref 38–126)
Anion gap: 12 (ref 5–15)
BUN: 14 mg/dL (ref 6–20)
CO2: 30 mmol/L (ref 22–32)
Calcium: 8.6 mg/dL — ABNORMAL LOW (ref 8.9–10.3)
Chloride: 98 mmol/L (ref 98–111)
Creatinine, Ser: 0.75 mg/dL (ref 0.44–1.00)
GFR calc Af Amer: >60 mL/min (ref >60)
GFR calc non Af Amer: >60 mL/min (ref >60)
Glucose, Bld: 251 mg/dL — ABNORMAL HIGH (ref 70–99)
Potassium: 3.2 mmol/L — ABNORMAL LOW (ref 3.5–5.1)
Sodium: 140 mmol/L (ref 135–145)
Total Bilirubin: 0.9 mg/dL (ref 0.3–1.2)
Total Protein: 7 g/dL (ref 6.5–8.1)

## 2017-10-26 LAB — CBC WITH DIFFERENTIAL/PLATELET
Basophils Absolute: 0.1 10*3/uL (ref 0.0–0.1)
Basophils Relative: 1 %
Eosinophils Absolute: 0.1 10*3/uL (ref 0.0–0.7)
Eosinophils Relative: 1 %
HCT: 34.7 % — ABNORMAL LOW (ref 36.0–46.0)
Hemoglobin: 11.4 g/dL — ABNORMAL LOW (ref 12.0–15.0)
Lymphocytes Relative: 40 %
Lymphs Abs: 3.4 10*3/uL (ref 0.7–4.0)
MCH: 29.6 pg (ref 26.0–34.0)
MCHC: 32.9 g/dL (ref 30.0–36.0)
MCV: 90.1 fL (ref 78.0–100.0)
Monocytes Absolute: 0.9 10*3/uL (ref 0.1–1.0)
Monocytes Relative: 11 %
Neutro Abs: 4 10*3/uL (ref 1.7–7.7)
Neutrophils Relative %: 47 %
Platelets: 358 10*3/uL (ref 150–400)
RBC: 3.85 MIL/uL — ABNORMAL LOW (ref 3.87–5.11)
RDW: 13.9 % (ref 11.5–15.5)
WBC: 8.5 10*3/uL (ref 4.0–10.5)

## 2017-10-26 LAB — HIV ANTIBODY (ROUTINE TESTING W REFLEX): HIV Screen 4th Generation wRfx: NONREACTIVE

## 2017-10-26 LAB — GLUCOSE, CAPILLARY
Glucose-Capillary: 224 mg/dL — ABNORMAL HIGH (ref 70–99)
Glucose-Capillary: 189 mg/dL — ABNORMAL HIGH (ref 70–99)
Glucose-Capillary: 247 mg/dL — ABNORMAL HIGH (ref 70–99)
Glucose-Capillary: 172 mg/dL — ABNORMAL HIGH (ref 70–99)
Glucose-Capillary: 134 mg/dL — ABNORMAL HIGH (ref 70–99)

## 2017-10-26 MED ORDER — INSULIN ASPART 100 UNIT/ML ~~LOC~~ SOLN
0.0000 [IU] | SUBCUTANEOUS | Status: DC
  Administered 2017-10-26: 5 [IU] via SUBCUTANEOUS
  Administered 2017-10-26: 3 [IU] via SUBCUTANEOUS
  Administered 2017-10-26: 2 [IU] via SUBCUTANEOUS
  Administered 2017-10-26: 3 [IU] via SUBCUTANEOUS

## 2017-10-26 MED ORDER — MAGNESIUM SULFATE IN D5W 1-5 GM/100ML-% IV SOLN
1.0000 g | Freq: Once | INTRAVENOUS | Status: AC
  Administered 2017-10-26: 1 g via INTRAVENOUS
  Filled 2017-10-26: qty 100

## 2017-10-26 MED ORDER — METOCLOPRAMIDE HCL 10 MG PO TABS
10.0000 mg | ORAL_TABLET | Freq: Three times a day (TID) | ORAL | Status: DC
  Administered 2017-10-26 (×2): 10 mg via ORAL
  Filled 2017-10-26 (×2): qty 1

## 2017-10-26 MED ORDER — FAMOTIDINE IN NACL 20-0.9 MG/50ML-% IV SOLN
20.0000 mg | Freq: Two times a day (BID) | INTRAVENOUS | Status: DC
  Administered 2017-10-26: 20 mg via INTRAVENOUS
  Filled 2017-10-26: qty 50

## 2017-10-26 MED ORDER — ENOXAPARIN SODIUM 40 MG/0.4ML ~~LOC~~ SOLN
40.0000 mg | SUBCUTANEOUS | Status: DC
  Administered 2017-10-26: 40 mg via SUBCUTANEOUS
  Filled 2017-10-26: qty 0.4

## 2017-10-26 MED ORDER — PROMETHAZINE HCL 25 MG/ML IJ SOLN
25.0000 mg | Freq: Once | INTRAMUSCULAR | Status: AC
  Administered 2017-10-26: 25 mg via INTRAVENOUS
  Filled 2017-10-26: qty 1

## 2017-10-26 MED ORDER — ONDANSETRON HCL 4 MG/2ML IJ SOLN
4.0000 mg | Freq: Four times a day (QID) | INTRAMUSCULAR | Status: DC | PRN
  Administered 2017-10-26: 4 mg via INTRAVENOUS
  Filled 2017-10-26: qty 2

## 2017-10-26 MED ORDER — POTASSIUM CHLORIDE CRYS ER 20 MEQ PO TBCR
40.0000 meq | EXTENDED_RELEASE_TABLET | ORAL | Status: AC
  Administered 2017-10-26 (×2): 40 meq via ORAL
  Filled 2017-10-26 (×2): qty 2

## 2017-10-26 MED ORDER — HYDROCODONE-ACETAMINOPHEN 5-325 MG PO TABS: 1.0000 | ORAL_TABLET | ORAL | Status: DC | PRN

## 2017-10-26 MED ORDER — SUCRALFATE 1 G PO TABS: 1.0000 g | ORAL_TABLET | Freq: Three times a day (TID) | ORAL | 0 refills | Status: DC

## 2017-10-26 MED ORDER — SUCRALFATE 1 G PO TABS
1.0000 g | ORAL_TABLET | Freq: Three times a day (TID) | ORAL | Status: DC
  Administered 2017-10-26 (×2): 1 g via ORAL
  Filled 2017-10-26 (×2): qty 1

## 2017-10-26 MED ORDER — ALPRAZOLAM 0.5 MG PO TABS: 0.5000 mg | ORAL_TABLET | Freq: Every evening | ORAL | Status: DC | PRN

## 2017-10-26 MED ORDER — CYCLOBENZAPRINE HCL 10 MG PO TABS: 10.0000 mg | ORAL_TABLET | Freq: Every day | ORAL | Status: DC

## 2017-10-26 MED ORDER — FAMOTIDINE 20 MG PO TABS: 20.0000 mg | ORAL_TABLET | Freq: Two times a day (BID) | ORAL | 0 refills | Status: DC

## 2017-10-26 MED ORDER — POTASSIUM CHLORIDE IN NACL 40-0.9 MEQ/L-% IV SOLN
INTRAVENOUS | Status: AC
  Administered 2017-10-26: 125 mL/h via INTRAVENOUS
  Filled 2017-10-26 (×2): qty 1000

## 2017-10-26 MED ORDER — ACETAMINOPHEN 325 MG PO TABS: 650.0000 mg | ORAL_TABLET | Freq: Four times a day (QID) | ORAL | Status: DC | PRN

## 2017-10-26 MED ORDER — ACETAMINOPHEN 650 MG RE SUPP: 650.0000 mg | Freq: Four times a day (QID) | RECTAL | Status: DC | PRN

## 2017-10-26 MED ORDER — METOCLOPRAMIDE HCL 10 MG PO TABS: 10.0000 mg | ORAL_TABLET | Freq: Three times a day (TID) | ORAL | 0 refills | Status: DC

## 2017-10-26 MED ORDER — HYDROMORPHONE HCL 1 MG/ML IJ SOLN
1.0000 mg | Freq: Once | INTRAMUSCULAR | Status: AC
  Administered 2017-10-26: 1 mg via INTRAVENOUS
  Filled 2017-10-26: qty 1

## 2017-10-26 MED ORDER — SODIUM CHLORIDE 0.9% FLUSH: 3.0000 mL | Freq: Two times a day (BID) | INTRAVENOUS | Status: DC

## 2017-10-26 MED ORDER — VENLAFAXINE HCL ER 150 MG PO CP24
150.0000 mg | ORAL_CAPSULE | Freq: Every day | ORAL | Status: DC
  Administered 2017-10-26: 150 mg via ORAL
  Filled 2017-10-26: qty 1

## 2017-10-26 MED ORDER — KETOROLAC TROMETHAMINE 30 MG/ML IJ SOLN
30.0000 mg | Freq: Four times a day (QID) | INTRAMUSCULAR | Status: DC | PRN
  Administered 2017-10-26 (×2): 30 mg via INTRAVENOUS
  Filled 2017-10-26 (×2): qty 1

## 2017-10-26 MED ORDER — ONDANSETRON HCL 4 MG PO TABS: 4.0000 mg | ORAL_TABLET | Freq: Four times a day (QID) | ORAL | Status: DC | PRN

## 2017-10-26 MED ORDER — METOCLOPRAMIDE HCL 5 MG/ML IJ SOLN
10.0000 mg | Freq: Three times a day (TID) | INTRAMUSCULAR | Status: DC
  Administered 2017-10-26 (×2): 10 mg via INTRAVENOUS
  Filled 2017-10-26 (×2): qty 2

## 2017-10-26 MED ORDER — INSULIN GLARGINE 100 UNIT/ML ~~LOC~~ SOLN
25.0000 [IU] | Freq: Every day | SUBCUTANEOUS | Status: DC
  Administered 2017-10-26: 25 [IU] via SUBCUTANEOUS
  Filled 2017-10-26: qty 0.25

## 2017-10-26 NOTE — Progress Notes (Signed)
Initial Nutrition Assessment  DOCUMENTATION CODES:   Obesity unspecified  INTERVENTION:   Encouraged PO intake Provided brief diet education for possible gastroparesis  NUTRITION DIAGNOSIS:   Inadequate oral intake related to nausea, vomiting as evidenced by per patient/family report.  GOAL:   Patient will meet greater than or equal to 90% of their needs  MONITOR:   PO intake, Labs, Weight trends, I & O's  REASON FOR ASSESSMENT:   Malnutrition Screening Tool    ASSESSMENT:   27 y.o. 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 She remains unable to tolerate anything by mouth without immediate vomiting and will be observed for ongoing evaluation and management of intractable nausea with vomiting, possibly diabetic gastroparesis.  Patient in room with family at bedside. Pt reports not eating much for a week. Pt has had N/V for over a week now. Currently states she still feels nauseous. Pt did eat some chicken broth, apple juice and italian ice this morning for breakfast. This didn't worsen her nausea. Pt last consumed solid food on 8/15 which was chicken, rice and green beans. Pt states she sometimes drinks protein shakes at home but isn't interested in any at this time. Pt's family with questions about what patient should eat to help with nausea and diarrhea. Reviewed appropriate food options.  Per patient, UBW is 221 lb. Pt has lost 11 lb since 8/16 (5% wt loss x 5 days, significant for time frame).  Encouraged pt to incorporate protein shakes into diet if she continues to lose weight unintentionally.  Medications: Reglan tablet TID, K-DUR tablet every 4 hours, Carafate tablet QID, IV Zofran PRN Labs reviewed: CBGs: 172-247 Low K   NUTRITION - FOCUSED PHYSICAL EXAM:  Nutrition focused  physical exam shows no sign of depletion of muscle mass or body fat.  Diet Order:   Diet Order            DIET SOFT Room service appropriate? Yes; Fluid consistency: Thin  Diet effective now              EDUCATION NEEDS:   Education needs have been addressed  Skin:  Skin Assessment: Reviewed RN Assessment  Last BM:  8/20  Height:   Ht Readings from Last 1 Encounters:  10/26/17 5\' 3"  (1.6 m)    Weight:   Wt Readings from Last 1 Encounters:  10/26/17 94.9 kg    Ideal Body Weight:  52.3 kg  BMI:  Body mass index is 37.06 kg/m.  Estimated Nutritional Needs:   Kcal:  1600-1800  Protein:  65-75g protein  Fluid:  1.8L/day  Tilda FrancoLindsey Garfield Coiner, MS, RD, LDN [facility] Clinical Dietitian Pager: [phone] After Hours Pager: [phone]

## 2017-10-26 NOTE — H&P (Signed)
History and Physical    Theresa [name] YNW:[id number] DOB: Jul 27, 1990 DOA: 10/25/2017  PCP: Virl Axe, FNP   Patient coming from: Home   Chief Complaint: Nausea, vomiting, upper abdominal discomfort   HPI: Theresa [name] is a 27 y.o. 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  Patient developed nausea with vomiting and upper abdominal discomfort several days ago, was evaluated in the emergency department on 10/21/2017 with CT of the abdomen and pelvis that suggested a possible colitis as well as a fat-containing liver lesion.  She was sent home with antibiotics and recommended follow-up for MRI liver.  She reports seeing her PCP and was referred to GI with appointment upcoming.  Unfortunately, the nausea and vomiting never resolved and has begun to worsen again.  She reports constant severe nausea for the past day or more with frequent vomiting.  She denies any diarrhea.  She denies fevers or chills.  She reports cramping discomfort in the upper abdomen with radiation of a burning pain up into the chest that is worse with vomiting and right after.  She has not experienced these symptoms until recently.  She continues to pass flatus.   ED Course: Upon arrival to the ED, patient is found to be afebrile, saturating well on room air, and with vitals otherwise normal.  Chemistry panel is notable for a potassium of 2.9 and bilirubin 2.6.  CBC features a mild thrombocytosis.  Lactic acid is reassuringly normal.  Urinalysis is notable for ketones and microscopic hematuria.  Patient was treated with a liter of normal saline, Dilaudid, morphine, Zofran, and Phenergan in the ED.  She remains unable to tolerate anything by mouth without immediate vomiting and will be observed for ongoing evaluation and management of  intractable nausea with vomiting, possibly diabetic gastroparesis.  Review of Systems:  All other systems reviewed and apart from HPI, are negative.  Past Medical History:  Diagnosis Date  . Fibromyalgia   . Headache   . Insomnia   . Insulin dependent type 1 diabetes mellitus (HCC)   . Peripheral neuropathy   . Seasonal allergies   . 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 CATARACT EXTRACTION W/ INTRAOCULAR LENS  IMPLANT, BILATERAL Bilateral 05/2014 - 06/2014  . 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 EYE SURGERY    . MASS EXCISION Right 07/10/2013   Procedure: MINOR EXCISION RIGHT NECK CYST;  Surgeon: Drema Halon, MD;  Location: [facility];  Service: ENT;  Laterality: Right;  . Nexplanon  05/07/2015  . PARS PLANA REPAIR OF RETINAL DEATACHMENT Right 11/04/2015  . PARS PLANA VITRECTOMY Right 11/04/2015   Procedure: PARS PLANA VITRECTOMY WITH 25 GAUGE TO REPAIR A COMPLEX TRACTION RETINAL DETACHMENT;  Surgeon: Sherrie George, MD;  [facility];  Service: Ophthalmology;  Laterality: Right;  . WISDOM TOOTH EXTRACTION  09/2015     reports that she has never smoked. She has never used smokeless tobacco. She reports that she does not drink alcohol or use drugs.  Allergies  Allergen Reactions  . Aloe Hives  . Duloxetine Other (See Comments)    RESTLESSNESS URGES TO MOVE  . Gabapentin Swelling    SWELLING REACTION OF FEET  . Olive  Oil Hives  . Lyrica [Pregabalin] Other (See Comments)    Heart palpitations Fainting  . Tapentadol Swelling  . Zonisamide Other (See Comments)    WORSENING PAIN    Family History  Problem Relation Age of Onset  . Diabetes Mother   . Migraines Mother   . Hypertension Mother   . CVA Mother   . Breast cancer Maternal Grandmother   . Thyroid disease Maternal Grandmother   .  Cancer Maternal Grandfather      Prior to Admission medications   Medication Sig Start Date End Date Taking? Authorizing Provider  ALPRAZolam Prudy Feeler(XANAX) 0.5 MG tablet Take 0.5 mg by mouth at bedtime as needed for anxiety.   Yes [provider]  ciprofloxacin (CIPRO) 500 MG tablet Take 1 tablet (500 mg total) by mouth 2 (two) times daily for 7 days. 10/22/17 10/29/17 Yes Soto, Leonie DouglasJohana, PA-C  cyclobenzaprine (FLEXERIL) 10 MG tablet Take 10 mg by mouth at bedtime. 10/18/17  Yes [provider]  etonogestrel (NEXPLANON) 68 MG IMPL implant 1 each by Subdermal route once. March 2017   Yes [provider]  insulin aspart (NOVOLOG FLEXPEN) 100 UNIT/ML FlexPen Inject 5-20 Units into the skin 3 (three) times daily with meals. Sliding scale   Yes [provider]  Insulin Degludec (TRESIBA FLEXTOUCH) 100 UNIT/ML SOPN Inject 55 Units into the skin daily.  12/14/14  Yes [provider]  metroNIDAZOLE (FLAGYL) 500 MG tablet Take 1 tablet (500 mg total) by mouth 3 (three) times daily for 7 days. 10/22/17 10/29/17 Yes Soto, Leonie DouglasJohana, PA-C  ondansetron (ZOFRAN-ODT) 4 MG disintegrating tablet Take 4 mg by mouth every 8 (eight) hours as needed for nausea or vomiting.  10/24/17  Yes [provider]  oxyCODONE-acetaminophen (PERCOCET) 7.5-325 MG tablet Take 1 tablet by mouth every 6 (six) hours as needed for severe pain. for pain 10/20/17  Yes [provider]  Turmeric (RA TURMERIC) 500 MG CAPS Take 500 mg by mouth daily.    Yes [provider]  venlafaxine (EFFEXOR) 75 MG tablet Take 150 mg by mouth daily.  01/22/15  Yes [provider]  Vitamin D, Ergocalciferol, (DRISDOL) 50000 units CAPS capsule Take 50,000 Units by mouth once a week. Sunday 05/24/17  Yes [provider]  megestrol (MEGACE) 40 MG tablet Take 1 tablet (40 mg total) by mouth 2 (two) times daily. Patient not taking: Reported on 10/21/2017 06/22/17   Harrington ChallengerYoung, Nancy J, NP    ondansetron (ZOFRAN) 4 MG tablet Take 1 tablet (4 mg total) by mouth every 6 (six) hours for 7 days. Patient not taking: Reported on 10/26/2017 10/22/17 10/29/17  Claude MangesSoto, Johana, PA-C    Physical Exam: Vitals:   10/25/17 2124  BP: (!) 146/94  Pulse: 98  Resp: 16  Temp: 98.4 F (36.9 C)  TempSrc: Oral  SpO2: 100%      Constitutional: NAD, calm  Eyes: PERTLA, lids and conjunctivae normal ENMT: Mucous membranes are moist. Posterior pharynx clear of any exudate or lesions.   Neck: normal, supple, no masses, no thyromegaly Respiratory: clear to auscultation bilaterally, no wheezing, no crackles. Normal respiratory effort.    Cardiovascular: S1 & S2 heard, regular rate and rhythm. No extremity edema.   Abdomen: No distension, soft, non-tender. Bowel sounds appreciated.  Musculoskeletal: no clubbing / cyanosis. No joint deformity upper and lower extremities.   Skin: no significant rashes, lesions, ulcers. Poor turgor.  Neurologic: CN 2-12 grossly intact. Strength 5/5 in all 4 limbs.  Psychiatric: Alert and oriented  x 3. Pleasant and cooperative.     Labs on Admission: I have personally reviewed following labs and imaging studies  CBC: Recent Labs  Lab 10/21/17 1756 10/25/17 2134  WBC 6.8 8.9  HGB 11.6* 12.1  HCT 35.1* 36.7  MCV 89.8 90.4  PLT 358 401*   Basic Metabolic Panel: Recent Labs  Lab 10/21/17 1756 10/25/17 2134  NA 137 138  K 4.1 2.9*  CL 103 92*  CO2 24 30  GLUCOSE 185* 286*  BUN 12 14  CREATININE 0.81 0.76  CALCIUM 8.6* 9.0   GFR: Estimated Creatinine Clearance: 119.1 mL/min (by C-G formula based on SCr of 0.76 mg/dL). Liver Function Tests: Recent Labs  Lab 10/21/17 1756 10/25/17 2134  AST 16 27  ALT 14 13  ALKPHOS 99 96  BILITOT 0.6 2.6*  PROT 7.3 8.0  ALBUMIN 3.3* 3.8   Recent Labs  Lab 10/21/17 1756 10/25/17 2134  LIPASE 19 20   No results for input(s): AMMONIA in the last 168 hours. Coagulation Profile: No results for input(s):  INR, PROTIME in the last 168 hours. Cardiac Enzymes: No results for input(s): CKTOTAL, CKMB, CKMBINDEX, TROPONINI in the last 168 hours. BNP (last 3 results) No results for input(s): PROBNP in the last 8760 hours. HbA1C: No results for input(s): HGBA1C in the last 72 hours. CBG: Recent Labs  Lab 10/21/17 1953  GLUCAP 169*   Lipid Profile: No results for input(s): CHOL, HDL, LDLCALC, TRIG, CHOLHDL, LDLDIRECT in the last 72 hours. Thyroid Function Tests: No results for input(s): TSH, T4TOTAL, FREET4, T3FREE, THYROIDAB in the last 72 hours. Anemia Panel: No results for input(s): VITAMINB12, FOLATE, FERRITIN, TIBC, IRON, RETICCTPCT in the last 72 hours. Urine analysis:    Component Value Date/Time   COLORURINE YELLOW 10/25/2017 2135   APPEARANCEUR CLEAR 10/25/2017 2135   LABSPEC 1.026 10/25/2017 2135   PHURINE 6.0 10/25/2017 2135   GLUCOSEU >=500 (A) 10/25/2017 2135   HGBUR MODERATE (A) 10/25/2017 2135   BILIRUBINUR NEGATIVE 10/25/2017 2135   BILIRUBINUR neg 07/19/2013 1519   KETONESUR 80 (A) 10/25/2017 2135   PROTEINUR 100 (A) 10/25/2017 2135   UROBILINOGEN 0.2 02/25/2014 0524   NITRITE NEGATIVE 10/25/2017 2135   LEUKOCYTESUR NEGATIVE 10/25/2017 2135   Sepsis Labs: @LABRCNTIP (procalcitonin:4,lacticidven:4) ) Recent Results (from the past 240 hour(s))  Wet prep, genital     Status: Abnormal   Collection Time: 10/21/17  8:42 PM  Result Value Ref Range Status   Yeast Wet Prep HPF POC NONE SEEN NONE SEEN Final    Comment: Specimen diluted due to transport tube containing more than 1 ml of saline, interpret results with caution.   Trich, Wet Prep NONE SEEN NONE SEEN Final    Comment: Specimen diluted due to transport tube containing more than 1 ml of saline, interpret results with caution.   Clue Cells Wet Prep HPF POC NONE SEEN NONE SEEN Final    Comment: Specimen diluted due to transport tube containing more than 1 ml of saline, interpret results with caution.   WBC, Wet  Prep HPF POC FEW (A) NONE SEEN Final    Comment: Specimen diluted due to transport tube containing more than 1 ml of saline, interpret results with caution.   Sperm NONE SEEN  Final    Comment: Specimen diluted due to transport tube containing more than 1 ml of saline, interpret results with caution. Performed at [facility], [address] [place], [place] [address]      Radiological Exams on Admission:  No results found.  EKG: Not performed.   Assessment/Plan  1. Intractable nausea with vomiting  - Presents with several days of nausea, vomiting, and upper abdominal discomfort  - Recent CT demonstrated a liver lesion and suggested a possible colitis; she was sent home with antibiotics but continues to have N/V and difficulty taking oral medications due to this   - There is no fever or leukocytosis to suggest infectious process, and she is not having any diarrhea  - She may have gastroparesis; she reports being referred to GI by PCP and has upcoming appt  - Treated in ED with IVF, antiemetics, and analgesia  - Continue IVF hydration, replete electrolytes, continue antiemetics, minimize opiate analgesics, advance diet as she tolerates   2. Type I DM  - No recent A1c, was 15.4% in 2015  - Managed at home with Tresiba 55 units qD and Novolog 5-20 units TID - Check CBG's, continue basal insulin with dose-reduction to start, continue SSI with Novolog    3. Hypokalemia  - Serum potassium is 2.9 on admission in setting of N/V  - KCl added to IVF and supplemental mag given empirically  - Continue cardiac monitoring and repeat chem panel in am    4. Chronic anxiety  - Continue Effexor and prn Xanax   5. Liver lesion  - Recent CT with fat-containing liver lesion suggestive of adenoma  - Outpatient MRI was been recommended at last visit - Pt reports she has been referred to GI by PCP     DVT prophylaxis: Lovenox Code Status: Full  Family Communication: Discussed  with patient  Consults called: None Admission status: Observation     Briscoe Deutscherimothy S Opyd, MD [facility] Pager [phone]  If 7PM-7AM, please contact night-coverage [email or web address] Password [email or web address]  10/26/2017, 12:38 AM

## 2017-10-26 NOTE — Discharge Summary (Signed)
Physician Discharge Summary  Theresa [name]  ZOX:[id number]  DOB: 11-20-1990  DOA: 10/25/2017 PCP: Virl Axe, FNP  Admit date: 10/25/2017 Discharge date: 10/26/2017  Admitted From: Home  Disposition: Home   Recommendations for Outpatient Follow-up:  1. Follow up with PCP in 1 week 2. Follow up with GI on 10/27/17 3. Please obtain BMP/CBC in one week to monitor Hgb and electrolytes   Discharge Condition: Stable   CODE STATUS: Full Code  Diet recommendation: Regular   Brief/Interim Summary: For full details see H&P/Progress note, but in brief, Theresa [name] is a 27 year old female with medical history significant for type 1 diabetes mellitus with peripheral neuropathy, fibromyalgia and anxiety presented to the emergency department for evaluation of nausea and vomiting with upper abdominal discomfort.  Patient was seen on 8/16 and CT abdomen showed possible colitis with fat-containing liver lesion.  She was sent home on antibiotic with recommended follow-up MRI of the liver.  Patient was seen by PCP and referred to GI with upcoming appointment on 8/22.  Upon ED evaluation patient was found to be dehydrated with potassium of 2.9, urine notable for ketones and mild elevated bilirubin of 2.6.  Patient was given IV fluids.  Patient was given p.o. trial and was unable to tolerate in the ED so therefore she was admitted for intractable nausea and vomiting secondary to possible diabetes gastroparesis.  Patient was started on Reglan and symptoms improved.   Subjective: Patient seen and examined, she has some nausea but has not vomited, abdominal pain has improved. Patient has an appointment with GI on 8/22. Tolerated full liquid diet well, does not wishes to try soft diet. No acute events overnight.   Discharge Diagnoses/Hospital Course:  Principal Problem:   Intractable nausea and vomiting Active Problems:   Type 1 diabetes mellitus (HCC)   Chronic pain   Liver lesion, left lobe    Hypokalemia   Anxiety  Intractable nausea and vomiting Felt to be secondary to diabetes gastroparesis, she was treated with Reglan with improvement of symptoms.  Patient tolerating diet, still with some dry heaves but no vomiting.  Will discharge on Reglan 10 mg 3 times daily. Pepcid 20 mg BID and Carafate 1G QID.  Advised to follow up with GI as an outpatient.  Patient may need gastric emptying study and EGD.  Patient was treated with IV fluid, antiemetic and analgesia. Patient report that she will continue full liquid diet at home until seen by GI.   Hypokalemia Repleted with IV electrolytes and given 80 mg of K-Dur. Check BMP in 1 week  Type 1 DM  Continue home regimen, CBGs stable during hospital stay.   Liver lesion  Recent CT with fat containing liver lesion suggestive of adenoma  Recommend MRI follow up as outpatient, will defer to GI.   Anxiety  Continue Effexor and PRN Xanax   All other chronic medical condition were stable during the hospitalization.  On the day of the discharge the patient's vitals were stable, and no other acute medical condition were reported by patient. the patient was felt safe to be discharge to home.   Discharge Instructions  You were cared for by a hospitalist during your hospital stay. If you have any questions about your discharge medications or the care you received while you were in the hospital after you are discharged, you can call the unit and asked to speak with the hospitalist on call if the hospitalist that took care of you is not available. Once you are discharged,  your primary care physician will handle any further medical issues. Please note that NO REFILLS for any discharge medications will be authorized once you are discharged, as it is imperative that you return to your primary care physician (or establish a relationship with a primary care physician if you do not have one) for your aftercare needs so that they can reassess your need for  medications and monitor your lab values.  Discharge Instructions    Call MD for:  difficulty breathing, headache or visual disturbances   Complete by:  As directed    Call MD for:  extreme fatigue   Complete by:  As directed    Call MD for:  hives   Complete by:  As directed    Call MD for:  persistant dizziness or light-headedness   Complete by:  As directed    Call MD for:  persistant nausea and vomiting   Complete by:  As directed    Call MD for:  redness, tenderness, or signs of infection (pain, swelling, redness, odor or green/yellow discharge around incision site)   Complete by:  As directed    Call MD for:  severe uncontrolled pain   Complete by:  As directed    Call MD for:  temperature >100.4   Complete by:  As directed    Diet - low sodium heart healthy   Complete by:  As directed    Increase activity slowly   Complete by:  As directed      Allergies as of 10/26/2017      Reactions   Aloe Hives   Duloxetine Other (See Comments)   RESTLESSNESS URGES TO MOVE   Gabapentin Swelling   SWELLING REACTION OF FEET   Olive Oil Hives   Lyrica [pregabalin] Other (See Comments)   Heart palpitations Fainting   Tapentadol Swelling   Zonisamide Other (See Comments)   WORSENING PAIN      Medication List    STOP taking these medications   ciprofloxacin 500 MG tablet Commonly known as:  CIPRO   megestrol 40 MG tablet Commonly known as:  MEGACE   metroNIDAZOLE 500 MG tablet Commonly known as:  FLAGYL   ondansetron 4 MG tablet Commonly known as:  ZOFRAN     TAKE these medications   ALPRAZolam 0.5 MG tablet Commonly known as:  XANAX Take 0.5 mg by mouth at bedtime as needed for anxiety.   cyclobenzaprine 10 MG tablet Commonly known as:  FLEXERIL Take 10 mg by mouth at bedtime.   famotidine 20 MG tablet Commonly known as:  PEPCID Take 1 tablet (20 mg total) by mouth 2 (two) times daily.   metoCLOPramide 10 MG tablet Commonly known as:  REGLAN Take 1 tablet  (10 mg total) by mouth 3 (three) times daily before meals.   NEXPLANON 68 MG Impl implant Generic drug:  etonogestrel 1 each by Subdermal route once. March 2017   NOVOLOG FLEXPEN 100 UNIT/ML FlexPen Generic drug:  insulin aspart Inject 5-20 Units into the skin 3 (three) times daily with meals. Sliding scale   ondansetron 4 MG disintegrating tablet Commonly known as:  ZOFRAN-ODT Take 4 mg by mouth every 8 (eight) hours as needed for nausea or vomiting.   oxyCODONE-acetaminophen 7.5-325 MG tablet Commonly known as:  PERCOCET Take 1 tablet by mouth every 6 (six) hours as needed for severe pain. for pain   RA TURMERIC 500 MG Caps Generic drug:  Turmeric Take 500 mg by mouth daily.   sucralfate 1  g tablet Commonly known as:  CARAFATE Take 1 tablet (1 g total) by mouth 4 (four) times daily -  with meals and at bedtime.   TRESIBA FLEXTOUCH 100 UNIT/ML Sopn FlexTouch Pen Generic drug:  insulin degludec Inject 55 Units into the skin daily.   venlafaxine XR 75 MG 24 hr capsule Commonly known as:  EFFEXOR-XR Take 150 mg by mouth daily with breakfast.   Vitamin D (Ergocalciferol) 50000 units Caps capsule Commonly known as:  DRISDOL Take 50,000 Units by mouth once a week. Sunday       Allergies  Allergen Reactions  . Aloe Hives  . Duloxetine Other (See Comments)    RESTLESSNESS URGES TO MOVE  . Gabapentin Swelling    SWELLING REACTION OF FEET  . Olive Oil Hives  . Lyrica [Pregabalin] Other (See Comments)    Heart palpitations Fainting  . Tapentadol Swelling  . Zonisamide Other (See Comments)    WORSENING PAIN    Consultations:  None    Procedures/Studies: Ct Abdomen Pelvis W Contrast  Result Date: 10/21/2017 CLINICAL DATA:  Patient with abdominal pain. EXAM: CT ABDOMEN AND PELVIS WITH CONTRAST TECHNIQUE: Multidetector CT imaging of the abdomen and pelvis was performed using the standard protocol following bolus administration of intravenous contrast. CONTRAST:   100mL ISOVUE-300 IOPAMIDOL (ISOVUE-300) INJECTION 61% COMPARISON:  Pelvic ultrasound earlier same date FINDINGS: Lower chest: Normal heart size. Dependent atelectasis within the bilateral lower lobes. No pleural effusion. Hepatobiliary: Within the left hepatic lobe there is a 6.2 x 2.4 cm soft tissue mass which appears to be both solid and cystic and fat containing (image 20; series 2). Normal appearance of the gallbladder. Pancreas: Unremarkable Spleen: Unremarkable Adrenals/Urinary Tract: Normal adrenal glands. Kidneys are lobular in contour. Urinary bladder is unremarkable. No hydronephrosis. Stomach/Bowel: There is wall thickening of the ascending and transverse colon. Normal morphology of the stomach. No free fluid or free intraperitoneal air. Vascular/Lymphatic: Normal caliber abdominal aorta. Peripheral calcified atherosclerotic plaque. No retroperitoneal lymphadenopathy. There a few prominent right lower quadrant mesenteric nodes measuring up to 7 mm (image 54; series 2), likely reactive. Reproductive: Uterus and adnexal structures are unremarkable. No pelvic mass identified. Other: None. Musculoskeletal: No aggressive or acute appearing osseous lesions. IMPRESSION: 1. Wall thickening of the ascending and transverse colon most compatible with colitis. 2. Within the left hepatic lobe there is a heterogeneous soft tissue mass which appears to contain both fat and fluid. Possibility of a heterogeneous hepatic adenoma is a consideration. There are additional less common fat containing hepatic lesions which may be considered. Recommend further evaluation of the hepatic mass in the nonacute setting with pre and post contrast-enhanced abdominal MRI. 3. No pelvic mass. Electronically Signed   By: Annia Beltrew  Davis M.D.   On: 10/21/2017 23:38   [place] Pelvic Doppler (torsion R/o Or Mass Arterial Flow)  Result Date: 10/21/2017 CLINICAL DATA:  Pelvic pain for 1 week. EXAM: TRANSABDOMINAL AND TRANSVAGINAL ULTRASOUND OF PELVIS  DOPPLER ULTRASOUND OF OVARIES TECHNIQUE: Both transabdominal and transvaginal ultrasound examinations of the pelvis were performed. Transabdominal technique was performed for global imaging of the pelvis including uterus, ovaries, adnexal regions, and pelvic cul-de-sac. It was necessary to proceed with endovaginal exam following the transabdominal exam to visualize the adnexal structures. Color and duplex Doppler ultrasound was utilized to evaluate blood flow to the ovaries. COMPARISON:  None. FINDINGS: Uterus Measurements: 5.5 x 3.5 x 4.8 cm. No fibroids or other mass visualized. Endometrium Thickness: 7 mm.  No focal abnormality visualized. Right ovary Measurements:  2.4 x 1.6 x 2.3 cm. Normal appearance/no adnexal mass. Left ovary Measurements: 4.1 x 2.4 x 4.3 cm. Normal appearance/no adnexal mass. There is a 2.8 x 2.0 x 2.3 cm cyst within the left ovary. Pulsed Doppler evaluation of both ovaries demonstrates normal low-resistance arterial and venous waveforms. Other findings Trace fluid in the pelvis. Within the right aspect of the pelvis, between the uterus and the right ovary, there is a large area of peripheral echogenicity with posterior acoustic shadowing, nonspecific in etiology. IMPRESSION: 1. Within the right hemipelvis between the right ovary and right aspect of the uterus there is a large area with peripheral increased echogenicity and posterior acoustic shadowing. This is nonspecific in etiology. This may represent a large bowel loop however the possibility of pelvic mass or dermoid are not excluded. Recommend further evaluation with contrast-enhanced CT of the abdomen and pelvis. Electronically Signed   By: Annia Belt M.D.   On: 10/21/2017 22:39   US Pelvic Complete With Transvaginal  Result Date: 10/21/2017 CLINICAL DATA:  Pelvic pain for 1 week. EXAM: TRANSABDOMINAL AND TRANSVAGINAL ULTRASOUND OF PELVIS DOPPLER ULTRASOUND OF OVARIES TECHNIQUE: Both transabdominal and transvaginal ultrasound  examinations of the pelvis were performed. Transabdominal technique was performed for global imaging of the pelvis including uterus, ovaries, adnexal regions, and pelvic cul-de-sac. It was necessary to proceed with endovaginal exam following the transabdominal exam to visualize the adnexal structures. Color and duplex Doppler ultrasound was utilized to evaluate blood flow to the ovaries. COMPARISON:  None. FINDINGS: Uterus Measurements: 5.5 x 3.5 x 4.8 cm. No fibroids or other mass visualized. Endometrium Thickness: 7 mm.  No focal abnormality visualized. Right ovary Measurements: 2.4 x 1.6 x 2.3 cm. Normal appearance/no adnexal mass. Left ovary Measurements: 4.1 x 2.4 x 4.3 cm. Normal appearance/no adnexal mass. There is a 2.8 x 2.0 x 2.3 cm cyst within the left ovary. Pulsed Doppler evaluation of both ovaries demonstrates normal low-resistance arterial and venous waveforms. Other findings Trace fluid in the pelvis. Within the right aspect of the pelvis, between the uterus and the right ovary, there is a large area of peripheral echogenicity with posterior acoustic shadowing, nonspecific in etiology. IMPRESSION: 1. Within the right hemipelvis between the right ovary and right aspect of the uterus there is a large area with peripheral increased echogenicity and posterior acoustic shadowing. This is nonspecific in etiology. This may represent a large bowel loop however the possibility of pelvic mass or dermoid are not excluded. Recommend further evaluation with contrast-enhanced CT of the abdomen and pelvis. Electronically Signed   By: Annia Belt M.D.   On: 10/21/2017 22:39     Discharge Exam: Vitals:   10/26/17 0427 10/26/17 1629  BP: 138/86 (!) 144/93  Pulse: (!) 103 93  Resp: 16 16  Temp: 98 F (36.7 C) 98.2 F (36.8 C)  SpO2: 99% 99%   Vitals:   10/26/17 0135 10/26/17 0237 10/26/17 0427 10/26/17 1629  BP: (!) 152/90  138/86 (!) 144/93  Pulse: (!) 105  (!) 103 93  Resp: 20  16 16   Temp: 98.3  F (36.8 C)  98 F (36.7 C) 98.2 F (36.8 C)  TempSrc:    Oral  SpO2: 97%  99% 99%  Weight: 94.3 kg  94.9 kg   Height:  5\' 3"  (1.6 m)      General: Pt is alert, awake, not in acute distress Cardiovascular: RRR, S1/S2 +, no rubs, no gallops Respiratory: CTA bilaterally, no wheezing, no rhonchi Abdominal: Soft, NT, ND,  bowel sounds + Extremities: no edema, no cyanosis   The results of significant diagnostics from this hospitalization (including imaging, microbiology, ancillary and laboratory) are listed below for reference.     Microbiology: Recent Results (from the past 240 hour(s))  Wet prep, genital     Status: Abnormal   Collection Time: 10/21/17  8:42 PM  Result Value Ref Range Status   Yeast Wet Prep HPF POC NONE SEEN NONE SEEN Final    Comment: Specimen diluted due to transport tube containing more than 1 ml of saline, interpret results with caution.   Trich, Wet Prep NONE SEEN NONE SEEN Final    Comment: Specimen diluted due to transport tube containing more than 1 ml of saline, interpret results with caution.   Clue Cells Wet Prep HPF POC NONE SEEN NONE SEEN Final    Comment: Specimen diluted due to transport tube containing more than 1 ml of saline, interpret results with caution.   WBC, Wet Prep HPF POC FEW (A) NONE SEEN Final    Comment: Specimen diluted due to transport tube containing more than 1 ml of saline, interpret results with caution.   Sperm NONE SEEN  Final    Comment: Specimen diluted due to transport tube containing more than 1 ml of saline, interpret results with caution. Performed at [facility], [address], [place], [place] [address]      Labs: BNP (last 3 results) No results for input(s): BNP in the last 8760 hours. Basic Metabolic Panel: Recent Labs  Lab 10/21/17 1756 10/25/17 2134 10/26/17 0607  NA 137 138 140  K 4.1 2.9* 3.2*  CL 103 92* 98  CO2 24 30 30   GLUCOSE 185* 286* 251*  BUN 12 14 14   CREATININE 0.81  0.76 0.75  CALCIUM 8.6* 9.0 8.6*   Liver Function Tests: Recent Labs  Lab 10/21/17 1756 10/25/17 2134 10/26/17 0607  AST 16 27 25   ALT 14 13 18   ALKPHOS 99 96 85  BILITOT 0.6 2.6* 0.9  PROT 7.3 8.0 7.0  ALBUMIN 3.3* 3.8 3.3*   Recent Labs  Lab 10/21/17 1756 10/25/17 2134  LIPASE 19 20   No results for input(s): AMMONIA in the last 168 hours. CBC: Recent Labs  Lab 10/21/17 1756 10/25/17 2134 10/26/17 0607  WBC 6.8 8.9 8.5  NEUTROABS  --   --  4.0  HGB 11.6* 12.1 11.4*  HCT 35.1* 36.7 34.7*  MCV 89.8 90.4 90.1  PLT 358 401* 358   Cardiac Enzymes: No results for input(s): CKTOTAL, CKMB, CKMBINDEX, TROPONINI in the last 168 hours. BNP: Invalid input(s): POCBNP CBG: Recent Labs  Lab 10/26/17 0144 10/26/17 0430 10/26/17 0739 10/26/17 1133 10/26/17 1628  GLUCAP 224* 189* 247* 172* 134*   D-Dimer No results for input(s): DDIMER in the last 72 hours. Hgb A1c No results for input(s): HGBA1C in the last 72 hours. Lipid Profile No results for input(s): CHOL, HDL, LDLCALC, TRIG, CHOLHDL, LDLDIRECT in the last 72 hours. Thyroid function studies No results for input(s): TSH, T4TOTAL, T3FREE, THYROIDAB in the last 72 hours.  Invalid input(s): FREET3 Anemia work up No results for input(s): VITAMINB12, FOLATE, FERRITIN, TIBC, IRON, RETICCTPCT in the last 72 hours. Urinalysis    Component Value Date/Time   COLORURINE YELLOW 10/25/2017 2135   APPEARANCEUR CLEAR 10/25/2017 2135   LABSPEC 1.026 10/25/2017 2135   PHURINE 6.0 10/25/2017 2135   GLUCOSEU >=500 (A) 10/25/2017 2135   HGBUR MODERATE (A) 10/25/2017 2135   BILIRUBINUR NEGATIVE 10/25/2017  2135   BILIRUBINUR neg 07/19/2013 1519   KETONESUR 80 (A) 10/25/2017 2135   PROTEINUR 100 (A) 10/25/2017 2135   UROBILINOGEN 0.2 02/25/2014 0524   NITRITE NEGATIVE 10/25/2017 2135   LEUKOCYTESUR NEGATIVE 10/25/2017 2135   Sepsis Labs Invalid input(s): PROCALCITONIN,  WBC,  LACTICIDVEN Microbiology Recent Results  (from the past 240 hour(s))  Wet prep, genital     Status: Abnormal   Collection Time: 10/21/17  8:42 PM  Result Value Ref Range Status   Yeast Wet Prep HPF POC NONE SEEN NONE SEEN Final    Comment: Specimen diluted due to transport tube containing more than 1 ml of saline, interpret results with caution.   Trich, Wet Prep NONE SEEN NONE SEEN Final    Comment: Specimen diluted due to transport tube containing more than 1 ml of saline, interpret results with caution.   Clue Cells Wet Prep HPF POC NONE SEEN NONE SEEN Final    Comment: Specimen diluted due to transport tube containing more than 1 ml of saline, interpret results with caution.   WBC, Wet Prep HPF POC FEW (A) NONE SEEN Final    Comment: Specimen diluted due to transport tube containing more than 1 ml of saline, interpret results with caution.   Sperm NONE SEEN  Final    Comment: Specimen diluted due to transport tube containing more than 1 ml of saline, interpret results with caution. Performed at [facility], [address] [place], [place] [id number]     Time coordinating discharge: 32 minutes  SIGNED:  Latrelle DodrillEdwin Silva, MD  [facility] 10/26/2017, 4:45 PM  Pager please text page via  [email or web address]  Note - This record has been created using [employer] software. Chart creation errors have been sought, but may not always have been located. Such creation errors do not reflect on the standard of medical care.

## 2017-11-03 ENCOUNTER — Encounter (INDEPENDENT_AMBULATORY_CARE_PROVIDER_SITE_OTHER): Payer: BLUE CROSS/BLUE SHIELD | Admitting: Ophthalmology

## 2017-11-10 ENCOUNTER — Encounter (INDEPENDENT_AMBULATORY_CARE_PROVIDER_SITE_OTHER): Payer: BLUE CROSS/BLUE SHIELD | Admitting: Ophthalmology

## 2017-11-10 DIAGNOSIS — H35372 Puckering of macula, left eye: Secondary | ICD-10-CM | POA: Diagnosis not present

## 2017-11-10 DIAGNOSIS — E103593 Type 1 diabetes mellitus with proliferative diabetic retinopathy without macular edema, bilateral: Secondary | ICD-10-CM

## 2017-11-10 DIAGNOSIS — H43812 Vitreous degeneration, left eye: Secondary | ICD-10-CM | POA: Diagnosis not present

## 2017-11-10 DIAGNOSIS — E10319 Type 1 diabetes mellitus with unspecified diabetic retinopathy without macular edema: Secondary | ICD-10-CM

## 2018-01-06 ENCOUNTER — Encounter: Payer: Self-pay | Admitting: Gynecology

## 2018-01-06 ENCOUNTER — Ambulatory Visit: Payer: BLUE CROSS/BLUE SHIELD | Admitting: Gynecology

## 2018-01-06 VITALS — BP 136/86

## 2018-01-06 DIAGNOSIS — Z113 Encounter for screening for infections with a predominantly sexual mode of transmission: Secondary | ICD-10-CM

## 2018-01-06 DIAGNOSIS — N898 Other specified noninflammatory disorders of vagina: Secondary | ICD-10-CM | POA: Diagnosis not present

## 2018-01-06 LAB — WET PREP FOR TRICH, YEAST, CLUE

## 2018-01-06 MED ORDER — FLUCONAZOLE 150 MG PO TABS: 150.0000 mg | ORAL_TABLET | Freq: Once | ORAL | 1 refills | Status: AC

## 2018-01-06 NOTE — Progress Notes (Signed)
    Theresa [name] 01/30/1991 [id number]        27 y.o.  G1P0010 presents with several weeks of vaginal irritation following antibiotic use for sinusitis.  Also requests STD screening.  No known exposure but wants to be screened.  No significant discharge, odor or urinary symptoms such as frequency dysuria urgency.  Past medical history,surgical history, problem list, medications, allergies, family history and social history were all reviewed and documented in the EPIC chart.  Directed ROS with pertinent positives and negatives documented in the history of present illness/assessment and plan.  Exam: Kennon Portela assistant Vitals:   01/06/18 1556  BP: 136/86   General appearance:  Normal Abdomen soft nontender without masses guarding rebound Pelvic external BUS vagina with white cakey discharge.  Cervix normal.  Uterus grossly normal midline mobile nontender.  Adnexa without masses or tenderness.  Assessment/Plan:  27 y.o. G1P0010 exam and wet prep consistent with yeast.  Diflucan 150 mg x 1 dose prescribed.  Additional refill provided in the event her symptoms persist.  She also requested STD screening without known exposure.  GC/chlamydia, hepatitis B, hepatitis C, HIV and RPR ordered.  Due to have her Nexplanon replaced March and I reminded her to schedule that appointment.    Dara Lords MD, 4:11 PM 01/06/2018

## 2018-01-06 NOTE — Patient Instructions (Addendum)
Follow-up in March 2020 to have the Nexplanon replaced.  Take the one Diflucan tablet for the yeast infection.  Repeat it if needed for persistent symptoms.

## 2018-01-07 LAB — C. TRACHOMATIS/N. GONORRHOEAE RNA
C. trachomatis RNA, TMA: NOT DETECTED
N. gonorrhoeae RNA, TMA: NOT DETECTED

## 2018-01-09 LAB — HEPATITIS C ANTIBODY
Hepatitis C Ab: NONREACTIVE
SIGNAL TO CUT-OFF: 0.03 (ref <1.00)

## 2018-01-09 LAB — RPR: RPR Ser Ql: NONREACTIVE

## 2018-01-09 LAB — HEPATITIS B SURFACE ANTIGEN: Hepatitis B Surface Ag: NONREACTIVE

## 2018-01-09 LAB — HIV ANTIBODY (ROUTINE TESTING W REFLEX): HIV 1&2 Ab, 4th Generation: NONREACTIVE

## 2018-04-19 ENCOUNTER — Encounter: Payer: BLUE CROSS/BLUE SHIELD | Admitting: Women's Health

## 2018-04-28 ENCOUNTER — Telehealth: Payer: Self-pay | Admitting: Physician Assistant

## 2018-04-28 ENCOUNTER — Other Ambulatory Visit: Payer: Self-pay

## 2018-04-28 ENCOUNTER — Ambulatory Visit: Payer: Self-pay | Admitting: Physician Assistant

## 2018-04-28 MED ORDER — ALPRAZOLAM 0.5 MG PO TABS: ORAL_TABLET | ORAL | 0 refills | Status: DC

## 2018-04-28 NOTE — Telephone Encounter (Signed)
Last fill 09/06/2017 #120

## 2018-04-28 NOTE — Telephone Encounter (Signed)
Theresa [name] had to RS appt today because TH is sick.  She RS to 3/17 but needs a refill on her Xanax.  Please send to [facility]

## 2018-04-28 NOTE — Telephone Encounter (Signed)
rx called [facility] per request.

## 2018-05-05 ENCOUNTER — Ambulatory Visit (INDEPENDENT_AMBULATORY_CARE_PROVIDER_SITE_OTHER): Payer: BLUE CROSS/BLUE SHIELD | Admitting: Physician Assistant

## 2018-05-05 ENCOUNTER — Encounter: Payer: Self-pay | Admitting: Physician Assistant

## 2018-05-05 VITALS — BP 143/93 | HR 98

## 2018-05-05 DIAGNOSIS — F411 Generalized anxiety disorder: Secondary | ICD-10-CM | POA: Diagnosis not present

## 2018-05-05 DIAGNOSIS — G4700 Insomnia, unspecified: Secondary | ICD-10-CM

## 2018-05-05 DIAGNOSIS — F908 Attention-deficit hyperactivity disorder, other type: Secondary | ICD-10-CM

## 2018-05-05 DIAGNOSIS — F331 Major depressive disorder, recurrent, moderate: Secondary | ICD-10-CM | POA: Diagnosis not present

## 2018-05-05 MED ORDER — ACETYLCYSTEINE 600 MG PO CAPS: 600.0000 mg | ORAL_CAPSULE | Freq: Two times a day (BID) | ORAL | 0 refills | Status: DC

## 2018-05-05 MED ORDER — ALPRAZOLAM 0.5 MG PO TABS: ORAL_TABLET | ORAL | 0 refills | Status: DC

## 2018-05-05 MED ORDER — MODAFINIL 200 MG PO TABS: 100.0000 mg | ORAL_TABLET | Freq: Every day | ORAL | 1 refills | Status: DC

## 2018-05-05 NOTE — Progress Notes (Signed)
[facility]  Patient ID: Theresa [name],  MRN: [id number]  PCP: Virl Axe, FNP  Date of Evaluation: 05/05/2018 Time spent:25 minutes  Chief Complaint:  Chief Complaint    Depression      HISTORY/CURRENT STATUS: HPI Here to discuss depression, inability to focus.   C/O decreased focus and attention for 2 years.  She's mentioned it to her PCP several times in the past and they've told her it's probably related to DM.  She doesn't think so.  Feels foggy all the time.  States she's stopped the oxycodone b/c she didn't want to be in that cycle of opiates.  She's seeing a new pain dr and has been started on low-dose Naltrexone for the pain.  Has been on it for 2 months, with minimal relief.  Not Using the Xanax but maybe a couple of times a month.  So, it doesn't seem that those meds are causing the decreased attn and finishing task. Gets distracted easily, even with things she's interested in , can be challenging.   Patient denies loss of interest in usual activities and is able to enjoy things. Has decreased energy and motivation.  Appetite has not changed. Does feel sad a lot but not tearful, no feelings of hopelessness. Denies suicidal or homicidal thoughts.  She has felt a little bit more depressed in the past couple of months.  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  She prefers to not take medication specifically for that or change anything with current medications, but work on that on her own.  She is not in counseling right now but is planning to get back in soon.  Still has anxiety. But not daily.  Some weeks she can go without the Xanax at all.  But then she may need it several times daily for a couple of days.  It still works when she needs it.  Denies muscle or joint pain, stiffness, or dystonia.  Denies dizziness, syncope, seizures, numbness,  tingling, tremor, tics, unsteady gait, slurred speech, confusion.   Individual Medical History/ Review of Systems: Changes? :Yes She has been started on low-dose naltrexone for the pain from her pain specialist.  States she is no longer taking oxycodone.  Past medications for mental health diagnoses include: Elavil, Cymbalta, gabapentin caused swelling in her lower extremities, Horizant, Topamax, baclofen, Lunesta, Belsomra was effective, trazodone caused increased glucose sonar caused increased glucose  Allergies: Aloe; Duloxetine; Gabapentin; Olive oil; Lyrica [pregabalin]; Tapentadol; and Zonisamide  Current Medications:  Current Outpatient Medications:  .  5-Hydroxytryptophan (5-HTP PO), Take by mouth., Disp: , Rfl:  .  ALPRAZolam (XANAX) 0.5 MG tablet, Take 1-2 tablets by mouth every 12 hours as needed for anxiety, Disp: 120 tablet, Rfl: 0 .  ASHWAGANDHA PO, Take by mouth., Disp: , Rfl:  .  B-COMPLEX-C PO, Take by mouth., Disp: , Rfl:  .  cyclobenzaprine (FLEXERIL) 10 MG tablet, Take 10 mg by mouth at bedtime., Disp: , Rfl: 1 .  etonogestrel (NEXPLANON) 68 MG IMPL implant, 1 each by Subdermal route once. March 2017, Disp: , Rfl:  .  famotidine (PEPCID) 20 MG tablet, Take 1 tablet (20 mg total) by mouth 2 (two) times daily., Disp: 60 tablet, Rfl: 0 .  Ginkgo Biloba Extract 60 MG CAPS, Take by mouth., Disp: , Rfl:  .  insulin aspart (NOVOLOG FLEXPEN) 100 UNIT/ML FlexPen,  Inject 5-20 Units into the skin 3 (three) times daily with meals. Sliding scale, Disp: , Rfl:  .  Insulin Degludec (TRESIBA FLEXTOUCH) 100 UNIT/ML SOPN, Inject 55 Units into the skin daily. , Disp: , Rfl:  .  NALTREXONE HCL PO, Take by mouth. For FM pain., Disp: , Rfl:  .  ondansetron (ZOFRAN-ODT) 4 MG disintegrating tablet, Take 4 mg by mouth every 8 (eight) hours as needed for nausea or vomiting. , Disp: , Rfl: 0 .  Turmeric (RA TURMERIC) 500 MG CAPS, Take 500 mg by mouth daily. , Disp: , Rfl:  .  venlafaxine XR  (EFFEXOR-XR) 75 MG 24 hr capsule, Take 150 mg by mouth daily with breakfast., Disp: , Rfl:  .  Vitamin D, Ergocalciferol, (DRISDOL) 50000 units CAPS capsule, Take 50,000 Units by mouth once a week. Sunday, Disp: , Rfl: 2 .  Acetylcysteine 600 MG CAPS, Take 1 capsule (600 mg total) by mouth 2 (two) times daily., Disp: 60 capsule, Rfl: 0 .  DOXYCYCLINE PO, Take by mouth., Disp: , Rfl:  .  modafinil (PROVIGIL) 200 MG tablet, Take 0.5-1 tablets (100-200 mg total) by mouth daily after breakfast., Disp: 30 tablet, Rfl: 1 .  oxyCODONE-acetaminophen (PERCOCET) 7.5-325 MG tablet, Take 1 tablet by mouth every 6 (six) hours as needed for severe pain. for pain, Disp: , Rfl: 0 Medication Side Effects: none  Family Medical/ Social History: Changes? No  MENTAL HEALTH EXAM:  Blood pressure (!) 143/93, pulse 98.There is no height or weight on file to calculate BMI.  General Appearance: Casual, Well Groomed and Obese  Eye Contact:  Good  Speech:  Clear and Coherent  Volume:  Normal  Mood:  Euthymic  Affect:  Appropriate  Thought Process:  Goal Directed  Orientation:  Full (Time, Place, and Person)  Thought Content: Logical   Suicidal Thoughts:  No  Homicidal Thoughts:  No  Memory:  WNL  Judgement:  Good  Insight:  Good  Psychomotor Activity:  Normal  Concentration:  Concentration: Fair and Attention Span: Fair  Recall:  Good  Fund of Knowledge: Good  Language: Good  Assets:  Desire for Improvement  ADL's:  Intact  Cognition: WNL  Prognosis:  Good    DIAGNOSES: No diagnosis found.  Receiving Psychotherapy: No    RECOMMENDATIONS: PDMP reviewed.  I discussed the fact that she has had the oxycodone filled monthly for 3 months.  If she is not taking the medication, do not feel it.  It looks like she is taking it.  She verbalizes understanding.  Last Xanax feel was last summer. We discussed adding a stimulant but if we did that she would have to go off of the Xanax or could only use it for  extreme emergencies like flying or something.  She prefers not to do that as his anxiety is still a problem at times.  We also discussed a milder stimulant of modafinil or armodafinil.  She is aware that insurance usually does not pay for those 2 drugs because she does not have a diagnosis of narcolepsy.  I have given her information for [employer] in [place] which are the cheapest to pay out of pocket per good Rx coupon.  She verbalizes understanding. Start modafinil 200 mg, one half p.o. every morning for at least a week.  If ineffective at that dose can increase to 200 mg again as needed.  We discussed benefits risks, side effects and she accepts. Continue Xanax 0.5 mg half to 1 twice daily as  needed. Continue low-dose naltrexone for pain management. Continue Effexor XR 75 mg twice daily. Continue B complex, ginkgo biloba, add multivitamin.  Continue vitamin D 50,000 IUs weekly. Get back in to see her counselor. Return in 4 weeks or sooner as needed.    Melony Overly, PA-C   This record has been created using [employer].  Chart creation errors have been sought, but may not always have been located and corrected. Such creation errors do not reflect on the standard of medical care.

## 2018-05-12 ENCOUNTER — Encounter (INDEPENDENT_AMBULATORY_CARE_PROVIDER_SITE_OTHER): Payer: BLUE CROSS/BLUE SHIELD | Admitting: Ophthalmology

## 2018-05-23 ENCOUNTER — Ambulatory Visit: Payer: Self-pay | Admitting: Physician Assistant

## 2018-05-24 ENCOUNTER — Encounter: Payer: BLUE CROSS/BLUE SHIELD | Admitting: Women's Health

## 2018-05-29 ENCOUNTER — Encounter (INDEPENDENT_AMBULATORY_CARE_PROVIDER_SITE_OTHER): Payer: BLUE CROSS/BLUE SHIELD | Admitting: Ophthalmology

## 2018-05-31 ENCOUNTER — Ambulatory Visit (INDEPENDENT_AMBULATORY_CARE_PROVIDER_SITE_OTHER): Payer: BLUE CROSS/BLUE SHIELD | Admitting: Physician Assistant

## 2018-05-31 ENCOUNTER — Encounter: Payer: Self-pay | Admitting: Physician Assistant

## 2018-05-31 ENCOUNTER — Other Ambulatory Visit: Payer: Self-pay

## 2018-05-31 DIAGNOSIS — F331 Major depressive disorder, recurrent, moderate: Secondary | ICD-10-CM

## 2018-05-31 NOTE — Progress Notes (Signed)
2:03pm No answer.  LM that I would try again in a few minutes for phone appt.   2:13 pm.  No answer.  I left msg for her to call the office to r/s appt.    Melony Overly, PA-C

## 2018-06-08 ENCOUNTER — Other Ambulatory Visit: Payer: Self-pay

## 2018-06-12 ENCOUNTER — Encounter: Payer: BLUE CROSS/BLUE SHIELD | Admitting: Women's Health

## 2018-06-13 ENCOUNTER — Ambulatory Visit: Payer: BLUE CROSS/BLUE SHIELD | Admitting: Gynecology

## 2018-06-20 ENCOUNTER — Encounter: Payer: BLUE CROSS/BLUE SHIELD | Admitting: Women's Health

## 2018-06-26 ENCOUNTER — Other Ambulatory Visit: Payer: Self-pay

## 2018-06-26 ENCOUNTER — Encounter (INDEPENDENT_AMBULATORY_CARE_PROVIDER_SITE_OTHER): Payer: BLUE CROSS/BLUE SHIELD | Admitting: Ophthalmology

## 2018-06-27 ENCOUNTER — Encounter: Payer: Self-pay | Admitting: Gynecology

## 2018-06-27 ENCOUNTER — Ambulatory Visit (INDEPENDENT_AMBULATORY_CARE_PROVIDER_SITE_OTHER): Payer: BLUE CROSS/BLUE SHIELD | Admitting: Gynecology

## 2018-06-27 VITALS — BP 122/82

## 2018-06-27 DIAGNOSIS — Z3046 Encounter for surveillance of implantable subdermal contraceptive: Secondary | ICD-10-CM

## 2018-06-27 DIAGNOSIS — Z30017 Encounter for initial prescription of implantable subdermal contraceptive: Secondary | ICD-10-CM | POA: Diagnosis not present

## 2018-06-27 HISTORY — PX: OTHER SURGICAL HISTORY: SHX169

## 2018-06-27 NOTE — Progress Notes (Signed)
    Theresa [name] 10/24/1990 [id number]        28 y.o.  G1P0010 presents for Nexplanon removal and reinsertion.  She has done well with her current Nexplanon and wants to have it replaced and it has been 3 years.. I reviewed the Nexplanon removal and insertional process and the side effects/risks. I reviewed irregular bleeding, insertion site infections, underlying neurovascular damage with permanent sequela, migration of the implant making removal difficult requiring surgery, the need to have it removed in 3 years under a separate procedure and lastly the risk of failure with pregnancy.  The patient is left handed. She has read through and signed the consent form.  Procedure with Kennon Portela assistant: Right upper arm examined and the Nexplanon was identified.  The skin overlying the distal end of the rod was cleansed with Betadine and infiltrated with 1% lidocaine.  A small skin incision was made and the Nexplanon rod was removed without difficulty through sharp and blunt dissection, shown to the patient and discarded.  The insertional tract was infiltrated with 1% lidocaine and the new Nexplanon was placed through the small skin incision according to manufacturer's recommendation without difficulty. The skin defect was closed with a Steri-Strip. The patient palpated the rod. A pressure dressing was applied and postoperative instructions give her.   Lot number:  [id number]   Dara Lords MD, 2:18 PM 06/27/2018

## 2018-06-27 NOTE — Patient Instructions (Signed)
Etonogestrel implant  What is this medicine?  ETONOGESTREL (et oh noe JES trel) is a contraceptive (birth control) device. It is used to prevent pregnancy. It can be used for up to 3 years.  This medicine may be used for other purposes; ask your health care provider or pharmacist if you have questions.  COMMON BRAND NAME(S): Implanon, Nexplanon  What should I tell my health care provider before I take this medicine?  They need to know if you have any of these conditions:  -abnormal vaginal bleeding  -blood vessel disease or blood clots  -breast, cervical, endometrial, ovarian, liver, or uterine cancer  -diabetes  -gallbladder disease  -heart disease or recent heart attack  -high blood pressure  -high cholesterol or triglycerides  -kidney disease  -liver disease  -migraine headaches  -seizures  -stroke  -tobacco smoker  -an unusual or allergic reaction to etonogestrel, anesthetics or antiseptics, other medicines, foods, dyes, or preservatives  -pregnant or trying to get pregnant  -breast-feeding  How should I use this medicine?  This device is inserted just under the skin on the inner side of your upper arm by a health care professional.  Talk to your pediatrician regarding the use of this medicine in children. Special care may be needed.  Overdosage: If you think you have taken too much of this medicine contact a poison control center or emergency room at once.  NOTE: This medicine is only for you. Do not share this medicine with others.  What if I miss a dose?  This does not apply.  What may interact with this medicine?  Do not take this medicine with any of the following medications:  -amprenavir  -fosamprenavir  This medicine may also interact with the following medications:  -acitretin  -aprepitant  -armodafinil  -bexarotene  -bosentan  -carbamazepine  -certain medicines for fungal infections like fluconazole, ketoconazole, itraconazole and voriconazole  -certain medicines to treat hepatitis, HIV or  AIDS  -cyclosporine  -felbamate  -griseofulvin  -lamotrigine  -modafinil  -oxcarbazepine  -phenobarbital  -phenytoin  -primidone  -rifabutin  -rifampin  -rifapentine  -St. John's wort  -topiramate  This list may not describe all possible interactions. Give your health care provider a list of all the medicines, herbs, non-prescription drugs, or dietary supplements you use. Also tell them if you smoke, drink alcohol, or use illegal drugs. Some items may interact with your medicine.  What should I watch for while using this medicine?  This product does not protect you against HIV infection (AIDS) or other sexually transmitted diseases.  You should be able to feel the implant by pressing your fingertips over the skin where it was inserted. Contact your doctor if you cannot feel the implant, and use a non-hormonal birth control method (such as condoms) until your doctor confirms that the implant is in place. Contact your doctor if you think that the implant may have broken or become bent while in your arm.  You will receive a user card from your health care provider after the implant is inserted. The card is a record of the location of the implant in your upper arm and when it should be removed. Keep this card with your health records.  What side effects may I notice from receiving this medicine?  Side effects that you should report to your doctor or health care professional as soon as possible:  -allergic reactions like skin rash, itching or hives, swelling of the face, lips, or tongue  -breast lumps, breast tissue   changes, or discharge  -breathing problems  -changes in emotions or moods  -if you feel that the implant may have broken or bent while in your arm  -high blood pressure  -pain, irritation, swelling, or bruising at the insertion site  -scar at site of insertion  -signs of infection at the insertion site such as fever, and skin redness, pain or discharge  -signs and symptoms of a blood clot such as breathing  problems; changes in vision; chest pain; severe, sudden headache; pain, swelling, warmth in the leg; trouble speaking; sudden numbness or weakness of the face, arm or leg  -signs and symptoms of liver injury like dark yellow or brown urine; general ill feeling or flu-like symptoms; light-colored stools; loss of appetite; nausea; right upper belly pain; unusually weak or tired; yellowing of the eyes or skin  -unusual vaginal bleeding, discharge  Side effects that usually do not require medical attention (report to your doctor or health care professional if they continue or are bothersome):  -acne  -breast pain or tenderness  -headache  -irregular menstrual bleeding  -nausea  This list may not describe all possible side effects. Call your doctor for medical advice about side effects. You may report side effects to FDA at 1-800-FDA-1088.  Where should I keep my medicine?  This drug is given in a hospital or clinic and will not be stored at home.  NOTE: This sheet is a summary. It may not cover all possible information. If you have questions about this medicine, talk to your doctor, pharmacist, or health care provider.   2019 [employer]vier/Gold Standard (2017-01-11 14:11:42)

## 2018-06-28 ENCOUNTER — Encounter: Payer: Self-pay | Admitting: Women's Health

## 2018-06-28 ENCOUNTER — Ambulatory Visit (INDEPENDENT_AMBULATORY_CARE_PROVIDER_SITE_OTHER): Payer: BLUE CROSS/BLUE SHIELD | Admitting: Women's Health

## 2018-06-28 ENCOUNTER — Other Ambulatory Visit: Payer: Self-pay

## 2018-06-28 VITALS — BP 140/88 | Ht 62.0 in | Wt 215.0 lb

## 2018-06-28 DIAGNOSIS — Z01419 Encounter for gynecological examination (general) (routine) without abnormal findings: Secondary | ICD-10-CM

## 2018-06-28 NOTE — Patient Instructions (Signed)
Vit D3 1000 iu daily Health Maintenance, Female Adopting a healthy lifestyle and getting preventive care can go a long way to promote health and wellness. Talk with your health care provider about what schedule of regular examinations is right for you. This is a good chance for you to check in with your provider about disease prevention and staying healthy. In between checkups, there are plenty of things you can do on your own. Experts have done a lot of research about which lifestyle changes and preventive measures are most likely to keep you healthy. Ask your health care provider for more information. Weight and diet Eat a healthy diet  Be sure to include plenty of vegetables, fruits, low-fat dairy products, and lean protein.  Do not eat a lot of foods high in solid fats, added sugars, or salt.  Get regular exercise. This is one of the most important things you can do for your health. ? Most adults should exercise for at least 150 minutes each week. The exercise should increase your heart rate and make you sweat (moderate-intensity exercise). ? Most adults should also do strengthening exercises at least twice a week. This is in addition to the moderate-intensity exercise. Maintain a healthy weight  Body mass index (BMI) is a measurement that can be used to identify possible weight problems. It estimates body fat based on height and weight. Your health care provider can help determine your BMI and help you achieve or maintain a healthy weight.  For females 89 years of age and older: ? A BMI below 18.5 is considered underweight. ? A BMI of 18.5 to 24.9 is normal. ? A BMI of 25 to 29.9 is considered overweight. ? A BMI of 30 and above is considered obese. Watch levels of cholesterol and blood lipids  You should start having your blood tested for lipids and cholesterol at 29 years of age, then have this test every 5 years.  You may need to have your cholesterol levels checked more often  if: ? Your lipid or cholesterol levels are high. ? You are older than 29 years of age. ? You are at high risk for heart disease. Cancer screening Lung Cancer  Lung cancer screening is recommended for adults 64-54 years old who are at high risk for lung cancer because of a history of smoking.  A yearly low-dose CT scan of the lungs is recommended for people who: ? Currently smoke. ? Have quit within the past 15 years. ? Have at least a 30-pack-year history of smoking. A pack year is smoking an average of one pack of cigarettes a day for 1 year.  Yearly screening should continue until it has been 15 years since you quit.  Yearly screening should stop if you develop a health problem that would prevent you from having lung cancer treatment. Breast Cancer  Practice breast self-awareness. This means understanding how your breasts normally appear and feel.  It also means doing regular breast self-exams. Let your health care provider know about any changes, no matter how small.  If you are in your 20s or 30s, you should have a clinical breast exam (CBE) by a health care provider every 1-3 years as part of a regular health exam.  If you are 40 or older, have a CBE every year. Also consider having a breast X-ray (mammogram) every year.  If you have a family history of breast cancer, talk to your health care provider about genetic screening.  If you are at high  risk for breast cancer, talk to your health care provider about having an MRI and a mammogram every year.  Breast cancer gene (BRCA) assessment is recommended for women who have family members with BRCA-related cancers. BRCA-related cancers include: ? Breast. ? Ovarian. ? Tubal. ? Peritoneal cancers.  Results of the assessment will determine the need for genetic counseling and BRCA1 and BRCA2 testing. Cervical Cancer Your health care provider may recommend that you be screened regularly for cancer of the pelvic organs (ovaries,  uterus, and vagina). This screening involves a pelvic examination, including checking for microscopic changes to the surface of your cervix (Pap test). You may be encouraged to have this screening done every 3 years, beginning at age 75.  For women ages 50-65, health care providers may recommend pelvic exams and Pap testing every 3 years, or they may recommend the Pap and pelvic exam, combined with testing for human papilloma virus (HPV), every 5 years. Some types of HPV increase your risk of cervical cancer. Testing for HPV may also be done on women of any age with unclear Pap test results.  Other health care providers may not recommend any screening for nonpregnant women who are considered low risk for pelvic cancer and who do not have symptoms. Ask your health care provider if a screening pelvic exam is right for you.  If you have had past treatment for cervical cancer or a condition that could lead to cancer, you need Pap tests and screening for cancer for at least 20 years after your treatment. If Pap tests have been discontinued, your risk factors (such as having a new sexual partner) need to be reassessed to determine if screening should resume. Some women have medical problems that increase the chance of getting cervical cancer. In these cases, your health care provider may recommend more frequent screening and Pap tests. Colorectal Cancer  This type of cancer can be detected and often prevented.  Routine colorectal cancer screening usually begins at 29 years of age and continues through 29 years of age.  Your health care provider may recommend screening at an earlier age if you have risk factors for colon cancer.  Your health care provider may also recommend using home test kits to check for hidden blood in the stool.  A small camera at the end of a tube can be used to examine your colon directly (sigmoidoscopy or colonoscopy). This is done to check for the earliest forms of colorectal  cancer.  Routine screening usually begins at age 17.  Direct examination of the colon should be repeated every 5-10 years through 29 years of age. However, you may need to be screened more often if early forms of precancerous polyps or small growths are found. Skin Cancer  Check your skin from head to toe regularly.  Tell your health care provider about any new moles or changes in moles, especially if there is a change in a mole's shape or color.  Also tell your health care provider if you have a mole that is larger than the size of a pencil eraser.  Always use sunscreen. Apply sunscreen liberally and repeatedly throughout the day.  Protect yourself by wearing long sleeves, pants, a wide-brimmed hat, and sunglasses whenever you are outside. Heart disease, diabetes, and high blood pressure  High blood pressure causes heart disease and increases the risk of stroke. High blood pressure is more likely to develop in: ? People who have blood pressure in the high end of the normal range (  130-139/85-89 mm Hg). ? People who are overweight or obese. ? People who are African American.  If you are 110-28 years of age, have your blood pressure checked every 3-5 years. If you are 42 years of age or older, have your blood pressure checked every year. You should have your blood pressure measured twice-once when you are at a hospital or clinic, and once when you are not at a hospital or clinic. Record the average of the two measurements. To check your blood pressure when you are not at a hospital or clinic, you can use: ? An automated blood pressure machine at a pharmacy. ? A home blood pressure monitor.  If you are between 28 years and 6 years old, ask your health care provider if you should take aspirin to prevent strokes.  Have regular diabetes screenings. This involves taking a blood sample to check your fasting blood sugar level. ? If you are at a normal weight and have a low risk for diabetes,  have this test once every three years after 29 years of age. ? If you are overweight and have a high risk for diabetes, consider being tested at a younger age or more often. Preventing infection Hepatitis B  If you have a higher risk for hepatitis B, you should be screened for this virus. You are considered at high risk for hepatitis B if: ? You were born in a country where hepatitis B is common. Ask your health care provider which countries are considered high risk. ? Your parents were born in a high-risk country, and you have not been immunized against hepatitis B (hepatitis B vaccine). ? You have HIV or AIDS. ? You use needles to inject street drugs. ? You live with someone who has hepatitis B. ? You have had sex with someone who has hepatitis B. ? You get hemodialysis treatment. ? You take certain medicines for conditions, including cancer, organ transplantation, and autoimmune conditions. Hepatitis C  Blood testing is recommended for: ? Everyone born from [birth year] through 1966. ? Anyone with known risk factors for hepatitis C. Sexually transmitted infections (STIs)  You should be screened for sexually transmitted infections (STIs) including gonorrhea and chlamydia if: ? You are sexually active and are younger than 29 years of age. ? You are older than 29 years of age and your health care provider tells you that you are at risk for this type of infection. ? Your sexual activity has changed since you were last screened and you are at an increased risk for chlamydia or gonorrhea. Ask your health care provider if you are at risk.  If you do not have HIV, but are at risk, it may be recommended that you take a prescription medicine daily to prevent HIV infection. This is called pre-exposure prophylaxis (PrEP). You are considered at risk if: ? You are sexually active and do not regularly use condoms or know the HIV status of your partner(s). ? You take drugs by injection. ? You are sexually  active with a partner who has HIV. Talk with your health care provider about whether you are at high risk of being infected with HIV. If you choose to begin PrEP, you should first be tested for HIV. You should then be tested every 3 months for as long as you are taking PrEP. Pregnancy  If you are premenopausal and you may become pregnant, ask your health care provider about preconception counseling.  If you may become pregnant, take 400 to 800 micrograms (  mcg) of folic acid every day.  If you want to prevent pregnancy, talk to your health care provider about birth control (contraception). Osteoporosis and menopause  Osteoporosis is a disease in which the bones lose minerals and strength with aging. This can result in serious bone fractures. Your risk for osteoporosis can be identified using a bone density scan.  If you are 64 years of age or older, or if you are at risk for osteoporosis and fractures, ask your health care provider if you should be screened.  Ask your health care provider whether you should take a calcium or vitamin D supplement to lower your risk for osteoporosis.  Menopause may have certain physical symptoms and risks.  Hormone replacement therapy may reduce some of these symptoms and risks. Talk to your health care provider about whether hormone replacement therapy is right for you. Follow these instructions at home:  Schedule regular health, dental, and eye exams.  Stay current with your immunizations.  Do not use any tobacco products including cigarettes, chewing tobacco, or electronic cigarettes.  If you are pregnant, do not drink alcohol.  If you are breastfeeding, limit how much and how often you drink alcohol.  Limit alcohol intake to no more than 1 drink per day for nonpregnant women. One drink equals 12 ounces of beer, 5 ounces of wine, or 1 ounces of hard liquor.  Do not use street drugs.  Do not share needles.  Ask your health care provider for  help if you need support or information about quitting drugs.  Tell your health care provider if you often feel depressed.  Tell your health care provider if you have ever been abused or do not feel safe at home. This information is not intended to replace advice given to you by your health care provider. Make sure you discuss any questions you have with your health care provider. Document Released: 09/07/2010 Document Revised: 07/31/2015 Document Reviewed: 11/26/2014 Elsevier Interactive Patient Education  2019 [employer].

## 2018-06-28 NOTE — Progress Notes (Signed)
Theresa [name] 1991/01/16 [id number]    History:    Presents for annual exam.  4/20 Nexplanon, had been amenorrheic with Nexplanon until the last few months.  Negative STD screen with past partner not sexually active currently.  Normal Pap history.  Gardasil series completed.  Type 1 diabetes since age 28 with poor control, 2017 bilateral cataracts has peripheral neuropathy.  Struggling with anxiety and depression therapist managing.  Past medical history, past surgical history, family history and social history were all reviewed and documented in the EPIC chart.  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  Mother deceased from diabetes in her 38s.  Father history unknown.  Raised by grandmother who is supportive.  Graduated from [employer] with degree in psychology.  [employer]  ROS:  A ROS was performed and pertinent positives and negatives are included.  Exam:  Vitals:   06/28/18 1243  BP: 140/88  Weight: 215 lb (97.5 kg)  Height: 5\' 2"  (1.575 m)   Body mass index is 39.32 kg/m.   General appearance:  Normal Thyroid:  Symmetrical, normal in size, without palpable masses or nodularity. Respiratory  Auscultation:  Clear without wheezing or rhonchi Cardiovascular  Auscultation:  Regular rate, without rubs, murmurs or gallops  Edema/varicosities:  Not grossly evident Abdominal  Soft,nontender, without masses, guarding or rebound.  Liver/spleen:  No organomegaly noted  Hernia:  None appreciated  Skin  Inspection: Numerous lesions legs, arms back, chest dermatology managing   Breasts: Examined lying and sitting.     Right: Without masses, retractions, discharge or axillary adenopathy.     Left: Without masses, retractions, discharge or axillary adenopathy. Gentitourinary   Inguinal/mons:  Normal without inguinal adenopathy  External genitalia:  Normal  BUS/Urethra/Skene's glands:  Normal  Vagina:  Normal  Cervix:  Normal  Uterus:   normal in size, shape and contour.  Midline  and mobile  Adnexa/parametria:     Rt: Without masses or tenderness.   Lt: Without masses or tenderness.  Anus and perineum: Normal    Assessment/Plan:  28 y.o. SBF G0 for annual exam with no GYN complaints.  06/2018 Nexplanon Type 1 diabetes insulin since age 45 poor control-endocrinologist managing labs and meds Depression therapist managing Obesity  Plan: SBEs, exercise, calcium rich foods, MVI daily encouraged.  Aware of need to decrease calorie/carbs for weight loss.  Instructed to call if no relief of irregular bleeding.  Continue therapy for depression.  Self-care, leisure activities encouraged.  Pap.  New screening guidelines reviewed.    Harrington Challenger [facility], 12:51 PM 06/28/2018

## 2018-06-29 ENCOUNTER — Encounter (INDEPENDENT_AMBULATORY_CARE_PROVIDER_SITE_OTHER): Payer: BLUE CROSS/BLUE SHIELD | Admitting: Ophthalmology

## 2018-06-29 DIAGNOSIS — E103593 Type 1 diabetes mellitus with proliferative diabetic retinopathy without macular edema, bilateral: Secondary | ICD-10-CM

## 2018-06-29 DIAGNOSIS — E10319 Type 1 diabetes mellitus with unspecified diabetic retinopathy without macular edema: Secondary | ICD-10-CM

## 2018-06-29 DIAGNOSIS — H35372 Puckering of macula, left eye: Secondary | ICD-10-CM

## 2018-06-29 DIAGNOSIS — H43812 Vitreous degeneration, left eye: Secondary | ICD-10-CM

## 2018-06-29 LAB — PAP IG W/ RFLX HPV ASCU

## 2018-07-05 ENCOUNTER — Other Ambulatory Visit: Payer: Self-pay

## 2018-07-05 ENCOUNTER — Ambulatory Visit (INDEPENDENT_AMBULATORY_CARE_PROVIDER_SITE_OTHER): Payer: BLUE CROSS/BLUE SHIELD | Admitting: Physician Assistant

## 2018-07-05 ENCOUNTER — Encounter: Payer: Self-pay | Admitting: Physician Assistant

## 2018-07-05 DIAGNOSIS — F908 Attention-deficit hyperactivity disorder, other type: Secondary | ICD-10-CM | POA: Diagnosis not present

## 2018-07-05 DIAGNOSIS — F411 Generalized anxiety disorder: Secondary | ICD-10-CM

## 2018-07-05 DIAGNOSIS — F331 Major depressive disorder, recurrent, moderate: Secondary | ICD-10-CM | POA: Diagnosis not present

## 2018-07-05 DIAGNOSIS — G4700 Insomnia, unspecified: Secondary | ICD-10-CM | POA: Diagnosis not present

## 2018-07-05 MED ORDER — AMPHETAMINE-DEXTROAMPHETAMINE 10 MG PO TABS: 10.0000 mg | ORAL_TABLET | Freq: Every day | ORAL | 0 refills | Status: DC

## 2018-07-05 NOTE — Progress Notes (Signed)
[facility] Check  Patient ID: Theresa [name],  MRN: [id number]  PCP: Virl Axe, FNP  Date of Evaluation: 07/05/2018 Time spent:15 minutes  Chief Complaint:  Chief Complaint    Follow-up     Virtual Visit via Telephone Note  I connected with patient by a video enabled telemedicine application or telephone, with their informed consent, and verified patient privacy and that I am speaking with the correct person using two identifiers.  I am private, in my home and the patient is at home.  I discussed the limitations, risks, security and privacy concerns of performing an evaluation and management service by telephone and the availability of in person appointments. I also discussed with the patient that there may be a patient responsible charge related to this service. The patient expressed understanding and agreed to proceed.   I discussed the assessment and treatment plan with the patient. The patient was provided an opportunity to ask questions and all were answered. The patient agreed with the plan and demonstrated an understanding of the instructions.   The patient was advised to call back or seek an in-person evaluation if the symptoms worsen or if the condition fails to improve as anticipated.  I provided 15 minutes of non-face-to-face time during this encounter.  HISTORY/CURRENT STATUS: HPI for routine med check.  At her visit 2 months ago we added modafinil to help with focus and concentration as well as her energy level.  She states it did not help at all.  If at all possible, she would like to try something else.  She has no motivation or energy.  She denies anhedonia, isolating, or crying easily.  "I do not feel depressed, I just have no energy is hard for me to stay on task and concentrate."  States the anxiety has been a lot better.  She has not taken a Xanax in 2 months.  She has the Xanax if needed.  Reports that it is usually triggered by things like family  gatherings or something like that.  "Even through all of the craziness of the coronavirus pandemic, I have not been anxious about it.  I am just going about my business."  No panic attacks.  She sleeps well.  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  States there are so many packages due to the coronavirus pandemic and shelter in place, the post office needs help to deal with that.  She has to go in at 4 in the morning.  Individual Medical History/ Review of Systems: Changes? :No    Past medications for mental health diagnoses include: Elavil, Cymbalta, gabapentin caused swelling in her lower extremities, Horizant, Topamax, baclofen, Lunesta, Belsomra was effective, trazodone caused increased glucose sonar caused increased glucose, Modafinil did not help.  Allergies: Aloe; Duloxetine; Gabapentin; Olive oil; Lyrica [pregabalin]; Tapentadol; and Zonisamide  Current Medications:  Current Outpatient Medications:  .  Acetylcysteine 600 MG CAPS, Take 1 capsule (600 mg total) by mouth 2 (two) times daily., Disp: 60 capsule, Rfl: 0 .  ALPRAZolam (XANAX) 0.5 MG tablet, Take 1-2 tablets by mouth every 12 hours as needed for anxiety, Disp: 120 tablet, Rfl: 0 .  ASHWAGANDHA PO, Take by mouth., Disp: , Rfl:  .  B-COMPLEX-C PO, Take by mouth., Disp: , Rfl:  .  cyclobenzaprine (FLEXERIL) 10 MG tablet, Take 10 mg by mouth at bedtime., Disp: , Rfl: 1 .  etonogestrel (NEXPLANON) 68 MG IMPL implant, 1 each by Subdermal route once.  March 2017, Disp: , Rfl:  .  famotidine (PEPCID) 20 MG tablet, Take 1 tablet (20 mg total) by mouth 2 (two) times daily., Disp: 60 tablet, Rfl: 0 .  Ginkgo Biloba Extract 60 MG CAPS, Take by mouth., Disp: , Rfl:  .  insulin aspart (NOVOLOG FLEXPEN) 100 UNIT/ML FlexPen, Inject 5-20 Units into the skin 3 (three) times daily with meals. Sliding scale, Disp: , Rfl:  .  Insulin Degludec (TRESIBA FLEXTOUCH) 100 UNIT/ML SOPN, Inject 55 Units into the skin  daily. , Disp: , Rfl:  .  modafinil (PROVIGIL) 200 MG tablet, Take 0.5-1 tablets (100-200 mg total) by mouth daily after breakfast., Disp: 30 tablet, Rfl: 1 .  NALTREXONE HCL PO, Take by mouth. For FM pain., Disp: , Rfl:  .  oxyCODONE-acetaminophen (PERCOCET) 7.5-325 MG tablet, Take 1 tablet by mouth every 6 (six) hours as needed for severe pain. for pain, Disp: , Rfl: 0 .  Turmeric (RA TURMERIC) 500 MG CAPS, Take 500 mg by mouth daily. , Disp: , Rfl:  .  venlafaxine XR (EFFEXOR-XR) 75 MG 24 hr capsule, Take 150 mg by mouth daily with breakfast., Disp: , Rfl:  .  5-Hydroxytryptophan (5-HTP PO), Take by mouth., Disp: , Rfl:  .  amphetamine-dextroamphetamine (ADDERALL) 10 MG tablet, Take 1 tablet (10 mg total) by mouth daily with breakfast., Disp: 30 tablet, Rfl: 0 .  DOXYCYCLINE PO, Take by mouth., Disp: , Rfl:  .  ondansetron (ZOFRAN-ODT) 4 MG disintegrating tablet, Take 4 mg by mouth every 8 (eight) hours as needed for nausea or vomiting. , Disp: , Rfl: 0 Medication Side Effects: none  Family Medical/ Social History: Changes? Yes stay in place orders d/t coronavirus. Working at [employer] now.   MENTAL HEALTH EXAM:  Last menstrual period 06/07/2018.There is no height or weight on file to calculate BMI.  General Appearance: unable to assess  Eye Contact:  unable to assess  Speech:  Clear and Coherent  Volume:  Normal  Mood:  Euthymic  Affect:  unable to assess  Thought Process:  Goal Directed  Orientation:  Full (Time, Place, and Person)  Thought Content: Logical   Suicidal Thoughts:  No  Homicidal Thoughts:  No  Memory:  WNL  Judgement:  Good  Insight:  Good  Psychomotor Activity:  unable to assess  Concentration:  Concentration: Fair and Attention Span: Fair  Recall:  Good  Fund of Knowledge: Good  Language: Good  Assets:  Desire for Improvement  ADL's:  Intact  Cognition: WNL  Prognosis:  Good  Blood pressures have been normal.  At visit on 06/28/2018 with GYN, BP was  140/88.  DIAGNOSES:    ICD-10-CM   1. Major depressive disorder, recurrent episode, moderate (HCC) F33.1   2. Attention deficit hyperactivity disorder (ADHD), other type F90.8   3. Insomnia, unspecified type G47.00   4. Generalized anxiety disorder F41.1     Receiving Psychotherapy: No    RECOMMENDATIONS: DC Xanax except for emergencies. Start Adderall 10 mg 1 p.o. every morning PRN.  PDMP was reviewed.  We discussed benefits, risks, side effects and patient accepts. Continue Effexor XR 75 mg 2 every morning. Continue NAC 600 mg twice daily.   Call in 1-2 weeks if the Adderall is ineffective and will increase to 15 mg.  She verbalizes understanding. Return in 4 weeks.   Melony Overlyeresa , PA-C   This record has been created using [employer] software.  Chart creation errors have been sought, but may not always have been  located and corrected. Such creation errors do not reflect on the standard of medical care.

## 2018-07-18 ENCOUNTER — Telehealth: Payer: Self-pay | Admitting: Physician Assistant

## 2018-07-18 ENCOUNTER — Other Ambulatory Visit: Payer: Self-pay | Admitting: Physician Assistant

## 2018-07-18 MED ORDER — AMPHETAMINE-DEXTROAMPHETAMINE 15 MG PO TABS: 15.0000 mg | ORAL_TABLET | Freq: Every day | ORAL | 0 refills | Status: DC

## 2018-07-18 NOTE — Telephone Encounter (Signed)
Please let her know to take 1.5 pills of the Adderall 10mg , until gone.  I just sent in Rx for 15mg  to get when she's finished w/ 10mg .

## 2018-07-18 NOTE — Progress Notes (Signed)
Rx sent for Adderall 15mg 

## 2018-07-18 NOTE — Telephone Encounter (Signed)
Pt called to advise Adderall 10 mg has not been working recently and would like to increase before next appt 5/28.

## 2018-07-19 NOTE — Telephone Encounter (Signed)
Unable to reach pt or leave message, will try again later.

## 2018-07-20 ENCOUNTER — Ambulatory Visit: Payer: BLUE CROSS/BLUE SHIELD | Admitting: Gynecology

## 2018-07-20 NOTE — Telephone Encounter (Signed)
Pt. Made aware and will let you know at next appt. If it is working.

## 2018-07-20 NOTE — Telephone Encounter (Signed)
Thanks

## 2018-08-03 ENCOUNTER — Encounter: Payer: Self-pay | Admitting: Physician Assistant

## 2018-08-03 ENCOUNTER — Other Ambulatory Visit: Payer: Self-pay

## 2018-08-03 ENCOUNTER — Ambulatory Visit (INDEPENDENT_AMBULATORY_CARE_PROVIDER_SITE_OTHER): Payer: BLUE CROSS/BLUE SHIELD | Admitting: Physician Assistant

## 2018-08-03 DIAGNOSIS — F331 Major depressive disorder, recurrent, moderate: Secondary | ICD-10-CM | POA: Diagnosis not present

## 2018-08-03 DIAGNOSIS — F411 Generalized anxiety disorder: Secondary | ICD-10-CM | POA: Diagnosis not present

## 2018-08-03 DIAGNOSIS — F908 Attention-deficit hyperactivity disorder, other type: Secondary | ICD-10-CM | POA: Diagnosis not present

## 2018-08-03 MED ORDER — AMPHETAMINE-DEXTROAMPHETAMINE 20 MG PO TABS: 20.0000 mg | ORAL_TABLET | Freq: Every day | ORAL | 0 refills | Status: DC

## 2018-08-03 NOTE — Progress Notes (Signed)
[facility]  Patient ID: Theresa [name],  MRN: [id number]  PCP: [name], [place], FNP  Date of Evaluation: 08/03/2018 Time spent:15 minutes  Chief Complaint:  Chief Complaint    Follow-up     Virtual Visit via Telephone Note  I connected with patient by a video enabled telemedicine application or telephone, with their informed consent, and verified patient privacy and that I am speaking with the correct person using two identifiers.  I am private, at [facility] and the patient is home.   I discussed the limitations, risks, security and privacy concerns of performing an evaluation and management service by telephone and the availability of in person appointments. I also discussed with the patient that there may be a patient responsible charge related to this service. The patient expressed understanding and agreed to proceed.   I discussed the assessment and treatment plan with the patient. The patient was provided an opportunity to ask questions and all were answered. The patient agreed with the plan and demonstrated an understanding of the instructions.   The patient was advised to call back or seek an in-person evaluation if the symptoms worsen or if the condition fails to improve as anticipated.  I provided 15 minutes of non-face-to-face time during this encounter.  HISTORY/CURRENT STATUS: HPI  For routine med check.   We ended up increasing the Adderall to 15 mg daily.  It has helped more than the 10 mg did, but while she was waiting on that prescription to be filled, she took 2 of the Adderall 10 mg which has helped more than anything else.  She has been able to concentrate more, finish tasks and has had no side effects such as insomnia.   States she has not needed the Xanax in several months now.  She feels good about that.  Asks for me to take over Rx the Effexor b/c PCP has left.  Is not really sure if it is helping or not.  She was originally prescribed it  because of her chronic pain.  Would eventually like to get off of it but does not feel like now is the time with everything that is going on.  Denies dizziness, syncope, seizures, numbness, tingling, tremor, tics, unsteady gait, slurred speech, confusion. Denies muscle or joint pain, stiffness, or dystonia.  Individual Medical History/ Review of Systems: Changes? :No    Past medications for mental health diagnoses include: Elavil, Cymbalta, gabapentin caused swelling in her lower extremities, Horizant, Topamax, baclofen, Lunesta, Belsomra was effective, trazodone caused increased glucose sonar caused increased glucose, Modafinil did not help.  Allergies: Aloe; Duloxetine; Gabapentin; Olive oil; Lyrica [pregabalin]; Tapentadol; and Zonisamide  Current Medications:  Current Outpatient Medications:  .  5-Hydroxytryptophan (5-HTP PO), Take by mouth., Disp: , Rfl:  .  Acetylcysteine 600 MG CAPS, Take 1 capsule (600 mg total) by mouth 2 (two) times daily., Disp: 60 capsule, Rfl: 0 .  ALPRAZolam (XANAX) 0.5 MG tablet, Take 1-2 tablets by mouth every 12 hours as needed for anxiety, Disp: 120 tablet, Rfl: 0 .  ASHWAGANDHA PO, Take by mouth., Disp: , Rfl:  .  B-COMPLEX-C PO, Take by mouth., Disp: , Rfl:  .  cyclobenzaprine (FLEXERIL) 10 MG tablet, Take 10 mg by mouth at bedtime., Disp: , Rfl: 1 .  etonogestrel (NEXPLANON) 68 MG IMPL implant, 1 each by Subdermal route once. March 2017, Disp: , Rfl:  .  famotidine (PEPCID) 20 MG tablet, Take 1 tablet (20 mg total) by mouth 2 (two) times daily., Disp:  60 tablet, Rfl: 0 .  Ginkgo Biloba Extract 60 MG CAPS, Take by mouth., Disp: , Rfl:  .  insulin aspart (NOVOLOG FLEXPEN) 100 UNIT/ML FlexPen, Inject 5-20 Units into the skin 3 (three) times daily with meals. Sliding scale, Disp: , Rfl:  .  Insulin Degludec (TRESIBA FLEXTOUCH) 100 UNIT/ML SOPN, Inject 55 Units into the skin daily. , Disp: , Rfl:  .  NALTREXONE HCL PO, Take by mouth. For FM pain., Disp: , Rfl:   .  ondansetron (ZOFRAN-ODT) 4 MG disintegrating tablet, Take 4 mg by mouth every 8 (eight) hours as needed for nausea or vomiting. , Disp: , Rfl: 0 .  oxyCODONE-acetaminophen (PERCOCET) 7.5-325 MG tablet, Take 1 tablet by mouth every 6 (six) hours as needed for severe pain. for pain, Disp: , Rfl: 0 .  Turmeric (RA TURMERIC) 500 MG CAPS, Take 500 mg by mouth daily. , Disp: , Rfl:  .  venlafaxine XR (EFFEXOR-XR) 75 MG 24 hr capsule, Take 150 mg by mouth daily with breakfast., Disp: , Rfl:  .  [START ON 08/17/2018] amphetamine-dextroamphetamine (ADDERALL) 20 MG tablet, Take 1 tablet (20 mg total) by mouth daily., Disp: 30 tablet, Rfl: 0 .  DOXYCYCLINE PO, Take by mouth., Disp: , Rfl:  Medication Side Effects: none  Family Medical/ Social History: Changes? No  MENTAL HEALTH EXAM:  There were no vitals taken for this visit.There is no height or weight on file to calculate BMI.  General Appearance: unable to assess  Eye Contact:  unable to assess  Speech:  Clear and Coherent  Volume:  Normal  Mood:  Euthymic  Affect:  unable to assess  Thought Process:  Goal Directed  Orientation:  Full (Time, Place, and Person)  Thought Content: Logical   Suicidal Thoughts:  No  Homicidal Thoughts:  No  Memory:  WNL  Judgement:  Good  Insight:  Good  Psychomotor Activity:  unable to assess  Concentration:  Concentration: Good and Attention Span: Good  Recall:  Good  Fund of Knowledge: Good  Language: Good  Assets:  Desire for Improvement  ADL's:  Intact  Cognition: WNL  Prognosis:  Good    DIAGNOSES:    ICD-10-CM   1. Attention deficit hyperactivity disorder (ADHD), other type F90.8   2. Major depressive disorder, recurrent episode, moderate (HCC) F33.1   3. Generalized anxiety disorder F41.1     Receiving Psychotherapy: No    RECOMMENDATIONS:  Increase Adderall to 20 mg every morning.  PDMP was reviewed.  It is only been about a week since she got the 15 mg.  I have advised her to take  1-1/2 of those pills to see how well she does with that, and then she can get the 20 mg filled early.  If she feels that the 22.5 mg is too strong, decrease back to 15 mg until she is able to get the 20.  She verbalizes understanding. Continue Effexor XR 75 mg, 2 daily.  I will be happy to take over writing this prescription.  We briefly discussed the fact that this may be helping her more than she realizes especially with the chronic pain.  If she really wants to get off of it, I am happy to assist in the future.  It will be a slow wean. Continue ginkgo biloba, B complex, NAC 5 HTP Continue Xanax 0.5 mg as needed emergencies. Return in 4 weeks.  Melony Overly, PA-C   This record has been created using [employer].  Chart creation errors  have been sought, but may not always have been located and corrected. Such creation errors do not reflect on the standard of medical care.

## 2018-08-10 ENCOUNTER — Ambulatory Visit: Payer: BC Managed Care – PPO | Admitting: Gynecology

## 2018-08-31 ENCOUNTER — Encounter: Payer: Self-pay | Admitting: Physician Assistant

## 2018-08-31 ENCOUNTER — Other Ambulatory Visit: Payer: Self-pay

## 2018-08-31 ENCOUNTER — Ambulatory Visit (INDEPENDENT_AMBULATORY_CARE_PROVIDER_SITE_OTHER): Payer: BC Managed Care – PPO | Admitting: Physician Assistant

## 2018-08-31 DIAGNOSIS — G4700 Insomnia, unspecified: Secondary | ICD-10-CM

## 2018-08-31 DIAGNOSIS — F411 Generalized anxiety disorder: Secondary | ICD-10-CM

## 2018-08-31 DIAGNOSIS — F331 Major depressive disorder, recurrent, moderate: Secondary | ICD-10-CM | POA: Diagnosis not present

## 2018-08-31 DIAGNOSIS — F908 Attention-deficit hyperactivity disorder, other type: Secondary | ICD-10-CM

## 2018-08-31 MED ORDER — AMPHETAMINE-DEXTROAMPHETAMINE 20 MG PO TABS: ORAL_TABLET | ORAL | 0 refills | Status: DC

## 2018-08-31 NOTE — Progress Notes (Signed)
[facility]  Patient ID: Theresa [name],  MRN: [id number]  PCP: Elenore Paddy, FNP  Date of Evaluation: 08/31/2018 Time spent:15 minutes  Chief Complaint:  Chief Complaint    ADD; Follow-up     Virtual Visit via Telephone Note  I connected with patient by a video enabled telemedicine application or telephone, with their informed consent, and verified patient privacy and that I am speaking with the correct person using two identifiers.  I am private, in my office and the patient is home.  I discussed the limitations, risks, security and privacy concerns of performing an evaluation and management service by telephone and the availability of in person appointments. I also discussed with the patient that there may be a patient responsible charge related to this service. The patient expressed understanding and agreed to proceed.   I discussed the assessment and treatment plan with the patient. The patient was provided an opportunity to ask questions and all were answered. The patient agreed with the plan and demonstrated an understanding of the instructions.   The patient was advised to call back or seek an in-person evaluation if the symptoms worsen or if the condition fails to improve as anticipated.  I provided 15 minutes of non-face-to-face time during this encounter.  HISTORY/CURRENT STATUS: HPI for routine med check.  At the last visit, we increased the Adderall.  She has been able to focus and finish things better without being so distracted.  The biggest problem is that the medication is not lasting long enough however.  She feels it is only effective for about 3 or 4 hours.  Not sure if it is quite strong enough to either but the longevity of its effect is the biggest problem that she has right now.  Patient denies loss of interest in usual activities and is able to enjoy things.  Denies decreased energy or motivation.  Appetite has not changed.  No extreme sadness,  tearfulness, or feelings of hopelessness.  Denies any changes in concentration, making decisions or remembering things.  Denies suicidal or homicidal thoughts.  Is controlled.  She is rarely needing the Xanax.  States she actually feels better being off of it.  Denies dizziness, syncope, seizures, numbness, tingling, tremor, tics, unsteady gait, slurred speech, confusion. Denies muscle or joint pain, stiffness, or dystonia.  Individual Medical History/ Review of Systems: Changes? :No    Past medications for mental health diagnoses include: Elavil, Cymbalta, gabapentin caused swelling in her lower extremities, Horizant, Topamax, baclofen, Lunesta, Belsomra was effective, trazodone caused increased glucose sonar caused increased glucose, Modafinildid not help.  Allergies: Aloe, Duloxetine, Gabapentin, Olive oil, Lyrica [pregabalin], Tapentadol, and Zonisamide  Current Medications:  Current Outpatient Medications:  .  5-Hydroxytryptophan (5-HTP PO), Take by mouth., Disp: , Rfl:  .  Acetylcysteine 600 MG CAPS, Take 1 capsule (600 mg total) by mouth 2 (two) times daily., Disp: 60 capsule, Rfl: 0 .  ALPRAZolam (XANAX) 0.5 MG tablet, Take 1-2 tablets by mouth every 12 hours as needed for anxiety, Disp: 120 tablet, Rfl: 0 .  ASHWAGANDHA PO, Take by mouth., Disp: , Rfl:  .  B-COMPLEX-C PO, Take by mouth., Disp: , Rfl:  .  cetirizine (ZYRTEC) 10 MG tablet, Take 10 mg by mouth daily., Disp: , Rfl:  .  cyclobenzaprine (FLEXERIL) 10 MG tablet, Take 10 mg by mouth at bedtime., Disp: , Rfl: 1 .  etonogestrel (NEXPLANON) 68 MG IMPL implant, 1 each by Subdermal route once. March 2017, Disp: , Rfl:  .  famotidine (PEPCID) 20 MG tablet, Take 1 tablet (20 mg total) by mouth 2 (two) times daily., Disp: 60 tablet, Rfl: 0 .  Ginkgo Biloba Extract 60 MG CAPS, Take by mouth., Disp: , Rfl:  .  insulin aspart (NOVOLOG FLEXPEN) 100 UNIT/ML FlexPen, Inject 5-20 Units into the skin 3 (three) times daily with meals.  Sliding scale, Disp: , Rfl:  .  Insulin Degludec (TRESIBA FLEXTOUCH) 100 UNIT/ML SOPN, Inject 55 Units into the skin daily. , Disp: , Rfl:  .  NALTREXONE HCL PO, Take by mouth. For FM pain., Disp: , Rfl:  .  ondansetron (ZOFRAN-ODT) 4 MG disintegrating tablet, Take 4 mg by mouth every 8 (eight) hours as needed for nausea or vomiting. , Disp: , Rfl: 0 .  oxyCODONE-acetaminophen (PERCOCET) 7.5-325 MG tablet, Take 1 tablet by mouth every 6 (six) hours as needed for severe pain. for pain, Disp: , Rfl: 0 .  Turmeric (RA TURMERIC) 500 MG CAPS, Take 500 mg by mouth daily. , Disp: , Rfl:  .  venlafaxine XR (EFFEXOR-XR) 75 MG 24 hr capsule, Take 150 mg by mouth daily with breakfast., Disp: , Rfl:  .  amphetamine-dextroamphetamine (ADDERALL) 20 MG tablet, 1 po q am, 1/2 at noon., Disp: 45 tablet, Rfl: 0 .  DOXYCYCLINE PO, Take by mouth., Disp: , Rfl:  Medication Side Effects: none  Family Medical/ Social History: Changes? No  MENTAL HEALTH EXAM:  There were no vitals taken for this visit.There is no height or weight on file to calculate BMI.  General Appearance: unable to assess  Eye Contact:  unable to assess  Speech:  Clear and Coherent  Volume:  Normal  Mood:  Euthymic  Affect:  unable to assess  Thought Process:  Goal Directed  Orientation:  Full (Time, Place, and Person)  Thought Content: Logical   Suicidal Thoughts:  No  Homicidal Thoughts:  No  Memory:  WNL  Judgement:  Good  Insight:  Good  Psychomotor Activity:  unable to assess  Concentration:  Concentration: Good and Attention Span: Fair  Recall:  Good  Fund of Knowledge: Good  Language: Good  Assets:  Desire for Improvement  ADL's:  Intact  Cognition: WNL  Prognosis:  Good    DIAGNOSES:    ICD-10-CM   1. Attention deficit hyperactivity disorder (ADHD), other type  F90.8   2. Major depressive disorder, recurrent episode, moderate (HCC)  F33.1   3. Generalized anxiety disorder  F41.1   4. Insomnia, unspecified type   G47.00     Receiving Psychotherapy: No    RECOMMENDATIONS:  Increase Adderall 20 mg to 1 every morning and then 1/2 q. afternoon.  We discussed the benefits of this helping her to focus longer during the day but if she takes it too late it can keep her up at night.  She verbalizes understanding.  We also talked about the possibility of changing to extended release.  We will discuss further in the future if needed. Continue Effexor XR 75 mg, 2 daily. Continue Xanax 0.5 mg rarely. Continue NAC 600 mg twice daily. Continue B complex. Return in 4 weeks.  Melony Overlyeresa Bettylee Feig, PA-C   This record has been created using [employer] software.  Chart creation errors have been sought, but may not always have been located and corrected. Such creation errors do not reflect on the standard of medical care.

## 2018-09-26 ENCOUNTER — Telehealth: Payer: Self-pay | Admitting: *Deleted

## 2018-09-26 MED ORDER — MEGESTROL ACETATE 20 MG PO TABS: 20.0000 mg | ORAL_TABLET | Freq: Two times a day (BID) | ORAL | 0 refills | Status: DC

## 2018-09-26 NOTE — Telephone Encounter (Signed)
Patient called c/o spotting for about 5 weeks now, has Nexplanon , not sexually active. Reports this happened in past and megace was sent to pharmacy, no flow, only spotting with wiping after using bathroom. Please advise

## 2018-09-26 NOTE — Telephone Encounter (Signed)
Ok, megace 20mg  bid for 10 days, have her call if continued spotting

## 2018-09-26 NOTE — Telephone Encounter (Signed)
patient aware, Rx sent. Patient also remember to schedule C&B now scheduled on 10/19/18.

## 2018-10-07 DIAGNOSIS — R87612 Low grade squamous intraepithelial lesion on cytologic smear of cervix (LGSIL): Secondary | ICD-10-CM

## 2018-10-07 HISTORY — DX: Low grade squamous intraepithelial lesion on cytologic smear of cervix (LGSIL): R87.612

## 2018-10-18 ENCOUNTER — Other Ambulatory Visit: Payer: Self-pay

## 2018-10-19 ENCOUNTER — Encounter: Payer: Self-pay | Admitting: Gynecology

## 2018-10-19 ENCOUNTER — Ambulatory Visit: Payer: BC Managed Care – PPO | Admitting: Gynecology

## 2018-10-19 VITALS — BP 118/74

## 2018-10-19 DIAGNOSIS — N870 Mild cervical dysplasia: Secondary | ICD-10-CM

## 2018-10-19 DIAGNOSIS — R87612 Low grade squamous intraepithelial lesion on cytologic smear of cervix (LGSIL): Secondary | ICD-10-CM

## 2018-10-19 DIAGNOSIS — N921 Excessive and frequent menstruation with irregular cycle: Secondary | ICD-10-CM

## 2018-10-19 MED ORDER — ESTRADIOL 1 MG PO TABS: 1.0000 mg | ORAL_TABLET | Freq: Every day | ORAL | 0 refills | Status: DC

## 2018-10-19 NOTE — Progress Notes (Signed)
    Zaraya Delauder November 22, 1990 [id number]        28 y.o.  G1P0010 presents for colposcopy.  Recent first abnormal Pap smear showed LGSIL.  Several previous Pap smears normal.  Also complaining of spotting almost daily since July.  Recently had her Nexplanon switched out in April.  Was treated with Megace per Izora Gala in July but has continued to bleed daily.  Past medical history,surgical history, problem list, medications, allergies, family history and social history were all reviewed and documented in the EPIC chart.  Directed ROS with pertinent positives and negatives documented in the history of present illness/assessment and plan.  Exam: Caryn Bee assistant Vitals:   10/19/18 1518  BP: 118/74   General appearance:  Normal Right upper inner arm with well-healed Nexplanon placement site.  Nexplanon rod palpated subcutaneously in proper position. Abdomen soft nontender without mass guarding rebound Pelvic external BUS vagina with vaginal spotting noted.  Cervix grossly normal.  Uterus normal size midline mobile nontender.  Adnexa without masses or tenderness.  Colposcopy performed after acetic acid cleanse was adequate with transformation zone visualized 360 degrees.  No abnormalities were seen.  ECC performed.  Patient tolerated well.  Physical Exam  Genitourinary:      Assessment/Plan:  28 y.o. G1P0010 with:  1. First abnormal Pap smear showing LGSIL.  Colposcopy was adequate normal.  The patient and I discussed dysplasia, high-grade/low-grade, progression/regression and the HPV association.  She will follow-up for the ECC results.  If negative or low-grade then plan expectant management with follow-up Pap smear next year when due for annual exam.  If otherwise then will triage based upon results. 2. Breakthrough bleeding on Nexplanon.  Course of Megace without resolution of her bleeding.  Will try estrogen supplementation add estradiol 1 mg daily x30 days.  If bleeding resolves then  will follow.  If continues I asked her to call and we will schedule an ultrasound for pelvic surveillance and endometrial assessment.    Anastasio Auerbach MD, 4:01 PM 10/19/2018

## 2018-10-19 NOTE — Patient Instructions (Signed)
Office will call you with biopsy results.  Call if the spotting continues after the estrogen treatment.

## 2018-10-23 ENCOUNTER — Other Ambulatory Visit: Payer: Self-pay

## 2018-10-23 ENCOUNTER — Ambulatory Visit (INDEPENDENT_AMBULATORY_CARE_PROVIDER_SITE_OTHER): Payer: BC Managed Care – PPO | Admitting: Physician Assistant

## 2018-10-23 ENCOUNTER — Encounter: Payer: Self-pay | Admitting: Physician Assistant

## 2018-10-23 ENCOUNTER — Encounter: Payer: Self-pay | Admitting: Gynecology

## 2018-10-23 DIAGNOSIS — F411 Generalized anxiety disorder: Secondary | ICD-10-CM

## 2018-10-23 DIAGNOSIS — G4700 Insomnia, unspecified: Secondary | ICD-10-CM

## 2018-10-23 DIAGNOSIS — F331 Major depressive disorder, recurrent, moderate: Secondary | ICD-10-CM | POA: Diagnosis not present

## 2018-10-23 DIAGNOSIS — F908 Attention-deficit hyperactivity disorder, other type: Secondary | ICD-10-CM

## 2018-10-23 LAB — TISSUE SPECIMEN

## 2018-10-23 LAB — PATHOLOGY REPORT

## 2018-10-23 MED ORDER — AMPHETAMINE-DEXTROAMPHETAMINE 20 MG PO TABS: ORAL_TABLET | ORAL | 0 refills | Status: DC

## 2018-10-23 NOTE — Progress Notes (Signed)
[facility]  Patient ID: Theresa [name],  MRN: [id number]  PCP: [name], [place], FNP  Date of Evaluation: 10/23/2018 Time spent:15 minutes  Chief Complaint:  Chief Complaint    Follow-up     Virtual Visit via Telephone Note  I connected with patient by a video enabled telemedicine application or telephone, with their informed consent, and verified patient privacy and that I am speaking with the correct person using two identifiers.  I am private, in my home and the patient is home.   I discussed the limitations, risks, security and privacy concerns of performing an evaluation and management service by telephone and the availability of in person appointments. I also discussed with the patient that there may be a patient responsible charge related to this service. The patient expressed understanding and agreed to proceed.   I discussed the assessment and treatment plan with the patient. The patient was provided an opportunity to ask questions and all were answered. The patient agreed with the plan and demonstrated an understanding of the instructions.   The patient was advised to call back or seek an in-person evaluation if the symptoms worsen or if the condition fails to improve as anticipated.  I provided 15 minutes of non-face-to-face time during this encounter.  HISTORY/CURRENT STATUS: HPI For routine med check.  At the last visit, we added the afternoon dose of Adderall.  She states it is helped quite a bit.  She is able to focus more and accomplish things in the afternoon and early evening than she was before adding that.  She denies any side effects such as insomnia, tachycardia, or palpitations.  Patient denies loss of interest in usual activities and is able to enjoy things.  Denies decreased energy or motivation.  Appetite has not changed.  No extreme sadness, tearfulness, or feelings of hopelessness.  Denies any changes in concentration, making decisions or  remembering things.  Denies suicidal or homicidal thoughts.  She has felt a little "down" lately but has had some issues with prolonged menstrual bleeding.  She is seeing her GYN for that.  She has had some anxiety several times.  She took Xanax once a week or so ago and it was helpful.  She is not taking it often though.  She sleeps well most of the time.  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  Denies dizziness, syncope, seizures, numbness, tingling, tremor, tics, unsteady gait, slurred speech, confusion. Denies muscle or joint pain, stiffness, or dystonia.  Individual Medical History/ Review of Systems: Changes? :Yes  menorrhagia, GYN has added estrogen a few days ago.   Past medications for mental health diagnoses include: Elavil, Cymbalta, gabapentin caused swelling in her lower extremities, Horizant, Topamax, baclofen, Lunesta, Belsomra was effective, trazodone caused increased glucose sonar caused increased glucose, Modafinildid not help.  Allergies: Aloe, Duloxetine, Gabapentin, Olive oil, Lyrica [pregabalin], Tapentadol, and Zonisamide  Current Medications:  Current Outpatient Medications:  .  Acetylcysteine 600 MG CAPS, Take 1 capsule (600 mg total) by mouth 2 (two) times daily., Disp: 60 capsule, Rfl: 0 .  ALPRAZolam (XANAX) 0.5 MG tablet, Take 1-2 tablets by mouth every 12 hours as needed for anxiety, Disp: 120 tablet, Rfl: 0 .  amphetamine-dextroamphetamine (ADDERALL) 20 MG tablet, 1 po q am, 1/2 at noon., Disp: 45 tablet, Rfl: 0 .  ASHWAGANDHA PO, Take by mouth., Disp: , Rfl:  .  cetirizine (ZYRTEC) 10 MG tablet, Take 10 mg by mouth daily., Disp: , Rfl:  .  cyclobenzaprine (FLEXERIL)  10 MG tablet, Take 10 mg by mouth at bedtime., Disp: , Rfl: 1 .  estradiol (ESTRACE) 1 MG tablet, Take 1 tablet (1 mg total) by mouth daily., Disp: 30 tablet, Rfl: 0 .  etonogestrel (NEXPLANON) 68 MG IMPL implant, 1 each by Subdermal route once. March 2017, Disp: , Rfl:  .  famotidine  (PEPCID) 20 MG tablet, Take 1 tablet (20 mg total) by mouth 2 (two) times daily., Disp: 60 tablet, Rfl: 0 .  Ginkgo Biloba Extract 60 MG CAPS, Take by mouth., Disp: , Rfl:  .  insulin aspart (NOVOLOG FLEXPEN) 100 UNIT/ML FlexPen, Inject 5-20 Units into the skin 3 (three) times daily with meals. Sliding scale, Disp: , Rfl:  .  Insulin Degludec (TRESIBA FLEXTOUCH) 100 UNIT/ML SOPN, Inject 55 Units into the skin daily. , Disp: , Rfl:  .  NALTREXONE HCL PO, Take by mouth. For FM pain., Disp: , Rfl:  .  oxyCODONE-acetaminophen (PERCOCET) 7.5-325 MG tablet, Take 1 tablet by mouth every 6 (six) hours as needed for severe pain. for pain, Disp: , Rfl: 0 .  Turmeric (RA TURMERIC) 500 MG CAPS, Take 500 mg by mouth daily. , Disp: , Rfl:  .  venlafaxine XR (EFFEXOR-XR) 75 MG 24 hr capsule, Take 150 mg by mouth daily with breakfast., Disp: , Rfl:  .  5-Hydroxytryptophan (5-HTP PO), Take by mouth., Disp: , Rfl:  .  [START ON 11/22/2018] amphetamine-dextroamphetamine (ADDERALL) 20 MG tablet, 1 q am, 1/2 q afternoon, Disp: 45 tablet, Rfl: 0 .  [START ON 12/21/2018] amphetamine-dextroamphetamine (ADDERALL) 20 MG tablet, 1 q am, 1/2 q afternoon., Disp: 45 tablet, Rfl: 0 .  B-COMPLEX-C PO, Take by mouth., Disp: , Rfl:  .  ondansetron (ZOFRAN-ODT) 4 MG disintegrating tablet, Take 4 mg by mouth every 8 (eight) hours as needed for nausea or vomiting. , Disp: , Rfl: 0 Medication Side Effects: none  Family Medical/ Social History: Changes? No  MENTAL HEALTH EXAM:  There were no vitals taken for this visit.There is no height or weight on file to calculate BMI.  General Appearance: unable to assess  Eye Contact:  unable to assess  Speech:  Clear and Coherent  Volume:  Normal  Mood:  Euthymic  Affect:  unable to assess  Thought Process:  Goal Directed  Orientation:  Full (Time, Place, and Person)  Thought Content: Logical   Suicidal Thoughts:  No  Homicidal Thoughts:  No  Memory:  WNL  Judgement:  Good  Insight:   Good  Psychomotor Activity:  unable to assess  Concentration:  Concentration: Good and Attention Span: Good  Recall:  Good  Fund of Knowledge: Good  Language: Good  Assets:  Desire for Improvement  ADL's:  Intact  Cognition: WNL  Prognosis:  Good    DIAGNOSES:    ICD-10-CM   1. Attention deficit hyperactivity disorder (ADHD), other type  F90.8   2. Major depressive disorder, recurrent episode, moderate (HCC)  F33.1   3. Generalized anxiety disorder  F41.1   4. Insomnia, unspecified type  G47.00     Receiving Psychotherapy: No    RECOMMENDATIONS:  Continue Adderall 20 mg 1 every morning and 1/2 p.o. in the afternoon. Continue Xanax 0.5 mg half to 1 daily as needed, take rarely. Continue NAC 600 mg 1 twice daily. Continue Effexor XR 75 mg, 2 p.o. every morning. Return in 3 months.  Donnal Moat, PA-C   This record has been created using [employer].  Chart creation errors have been sought, but  may not always have been located and corrected. Such creation errors do not reflect on the standard of medical care.

## 2018-11-27 ENCOUNTER — Encounter: Payer: Self-pay | Admitting: Gynecology

## 2019-01-01 ENCOUNTER — Encounter (INDEPENDENT_AMBULATORY_CARE_PROVIDER_SITE_OTHER): Payer: BC Managed Care – PPO | Admitting: Ophthalmology

## 2019-01-05 ENCOUNTER — Encounter (INDEPENDENT_AMBULATORY_CARE_PROVIDER_SITE_OTHER): Payer: BC Managed Care – PPO | Admitting: Ophthalmology

## 2019-01-11 ENCOUNTER — Encounter (INDEPENDENT_AMBULATORY_CARE_PROVIDER_SITE_OTHER): Payer: BC Managed Care – PPO | Admitting: Ophthalmology

## 2019-01-11 DIAGNOSIS — H43813 Vitreous degeneration, bilateral: Secondary | ICD-10-CM

## 2019-01-11 DIAGNOSIS — E103593 Type 1 diabetes mellitus with proliferative diabetic retinopathy without macular edema, bilateral: Secondary | ICD-10-CM | POA: Diagnosis not present

## 2019-01-11 DIAGNOSIS — E10319 Type 1 diabetes mellitus with unspecified diabetic retinopathy without macular edema: Secondary | ICD-10-CM

## 2019-01-11 DIAGNOSIS — H26491 Other secondary cataract, right eye: Secondary | ICD-10-CM | POA: Diagnosis not present

## 2019-01-23 ENCOUNTER — Ambulatory Visit: Payer: BC Managed Care – PPO | Admitting: Physician Assistant

## 2019-01-31 ENCOUNTER — Ambulatory Visit: Payer: BC Managed Care – PPO | Admitting: Adult Health

## 2019-02-06 ENCOUNTER — Other Ambulatory Visit: Payer: Self-pay

## 2019-02-06 ENCOUNTER — Encounter: Payer: Self-pay | Admitting: Adult Health

## 2019-02-06 ENCOUNTER — Ambulatory Visit (INDEPENDENT_AMBULATORY_CARE_PROVIDER_SITE_OTHER): Payer: BC Managed Care – PPO | Admitting: Adult Health

## 2019-02-06 DIAGNOSIS — G4700 Insomnia, unspecified: Secondary | ICD-10-CM | POA: Diagnosis not present

## 2019-02-06 DIAGNOSIS — F908 Attention-deficit hyperactivity disorder, other type: Secondary | ICD-10-CM

## 2019-02-06 DIAGNOSIS — F331 Major depressive disorder, recurrent, moderate: Secondary | ICD-10-CM | POA: Diagnosis not present

## 2019-02-06 DIAGNOSIS — F411 Generalized anxiety disorder: Secondary | ICD-10-CM | POA: Diagnosis not present

## 2019-02-06 MED ORDER — BUPROPION HCL ER (XL) 150 MG PO TB24: ORAL_TABLET | ORAL | 2 refills | Status: DC

## 2019-02-06 NOTE — Progress Notes (Signed)
Theresa [name] [id number] 1990-04-08 28 y.o.  Subjective:   Patient ID:  Theresa [name] is a 28 y.o. (DOB 10-29-1990) female.  Chief Complaint: No chief complaint on file.   HPI Theresa [name] presents to the office today for follow-up of anxiety, depression, ADHD, and insomnia.   Describes mood today as "ok". Pleasant. Flat. Mood symptoms - reports depression, anxiety, and irritability. Stating "I think I might have seasonal depression". Also stating "I may just have depression related to my chronic illness". Diagnosed with Fibromyalgia 5 years ago. Feels like the fibromyalgia "pulls me down". Feels like she needs a medication adjustment or change. Adderall has not helped with interest and motivation - not taking afternoon dose most days. Taking other medications as prescribed.  Energy levels lower. Active, has a regular exercise routine. Works full-time out of her home - [place]. Enjoys some usual interests and activities. Lives alone. Spending time with family - cousins local. Both parents deceased.  Appetite decreased. Weight loss this year - medical issues - 20 pounds.  Sleeping difficulties. Either sleeps too much or not all - pain or taking Adderall too late in the day. Averages 10 or more hours when she does sleep.  Focus and concentration mostly stable. Completing tasks. Managing aspects of household.  Denies SI or HI. Denies AH or VH.  Past medications for mental health diagnoses include: Elavil, Cymbalta, gabapentin caused swelling in her lower extremities, Horizant, Topamax, baclofen, Lunesta, Belsomra was effective, trazodone caused increased glucose sonar caused increased glucose, Modafinildid not help.  Allergies: Aloe, Duloxetine, Gabapentin, Olive oil, Lyrica [pregabalin], Tapentadol, and Zonisamide  Review of Systems:  Review of Systems  Musculoskeletal: Negative for gait problem.  Neurological: Negative for tremors, seizures and weakness.   Psychiatric/Behavioral: Positive for sleep disturbance.    Medications: I have reviewed the patient's current medications.  Current Outpatient Medications  Medication Sig Dispense Refill  . 5-Hydroxytryptophan (5-HTP PO) Take by mouth.    . Acetylcysteine 600 MG CAPS Take 1 capsule (600 mg total) by mouth 2 (two) times daily. 60 capsule 0  . ALPRAZolam (XANAX) 0.5 MG tablet Take 1-2 tablets by mouth every 12 hours as needed for anxiety 120 tablet 0  . amphetamine-dextroamphetamine (ADDERALL) 20 MG tablet 1 po q am, 1/2 at noon. 45 tablet 0  . amphetamine-dextroamphetamine (ADDERALL) 20 MG tablet 1 q am, 1/2 q afternoon 45 tablet 0  . amphetamine-dextroamphetamine (ADDERALL) 20 MG tablet 1 q am, 1/2 q afternoon. 45 tablet 0  . ASHWAGANDHA PO Take by mouth.    . B-COMPLEX-C PO Take by mouth.    Marland Kitchen buPROPion (WELLBUTRIN XL) 150 MG 24 hr tablet Take one tablet daily x 7 days, then two tablets daily. 60 tablet 2  . cetirizine (ZYRTEC) 10 MG tablet Take 10 mg by mouth daily.    . cyclobenzaprine (FLEXERIL) 10 MG tablet Take 10 mg by mouth at bedtime.  1  . estradiol (ESTRACE) 1 MG tablet Take 1 tablet (1 mg total) by mouth daily. 30 tablet 0  . etonogestrel (NEXPLANON) 68 MG IMPL implant 1 each by Subdermal route once. March 2017    . famotidine (PEPCID) 20 MG tablet Take 1 tablet (20 mg total) by mouth 2 (two) times daily. 60 tablet 0  . Ginkgo Biloba Extract 60 MG CAPS Take by mouth.    . insulin aspart (NOVOLOG FLEXPEN) 100 UNIT/ML FlexPen Inject 5-20 Units into the skin 3 (three) times daily with meals. Sliding scale    . Insulin Degludec (TRESIBA  FLEXTOUCH) 100 UNIT/ML SOPN Inject 55 Units into the skin daily.     Marland Kitchen NALTREXONE HCL PO Take by mouth. For FM pain.    Marland Kitchen ondansetron (ZOFRAN-ODT) 4 MG disintegrating tablet Take 4 mg by mouth every 8 (eight) hours as needed for nausea or vomiting.   0  . oxyCODONE-acetaminophen (PERCOCET) 7.5-325 MG tablet Take 1 tablet by mouth every 6 (six)  hours as needed for severe pain. for pain  0  . Turmeric (RA TURMERIC) 500 MG CAPS Take 500 mg by mouth daily.     Marland Kitchen venlafaxine XR (EFFEXOR-XR) 75 MG 24 hr capsule Take 150 mg by mouth daily with breakfast.     No current facility-administered medications for this visit.     Medication Side Effects: None  Allergies:  Allergies  Allergen Reactions  . Aloe Hives  . Duloxetine Other (See Comments)    RESTLESSNESS URGES TO MOVE  . Gabapentin Swelling    SWELLING REACTION OF FEET  . Olive Oil Hives  . Lyrica [Pregabalin] Other (See Comments)    Heart palpitations Fainting  . Tapentadol Swelling  . Zonisamide Other (See Comments)    WORSENING PAIN    Past Medical History:  Diagnosis Date  . Fibromyalgia   . Headache   . Insomnia   . Insulin dependent type 1 diabetes mellitus (HCC)   . 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  Recommend follow-up Pap smear/HPV at next annual exam.  . Peripheral neuropathy   . Seasonal allergies   . 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 Diabetes Mother   . Migraines Mother   . Hypertension Mother   . CVA Mother   . Breast cancer Maternal Grandmother   . Thyroid disease Maternal Grandmother   . Cancer Maternal Grandfather     Social History   Socioeconomic History  . Marital status: Single    Spouse name: Not on file  . Number of children: Not on file  . Years of education: Not on file  . Highest education level: Not on file  Occupational History  . Not on file  Social Needs  . Financial resource strain: Not on file  . Food insecurity    Worry: Not on file    Inability: Not on file  . Transportation needs    Medical: Not on file    Non-medical: Not on file  Tobacco Use  . Smoking status: Never Smoker  . Smokeless tobacco: Never Used  Substance and Sexual Activity  . Alcohol  use: No    Alcohol/week: 0.0 standard drinks  . Drug use: No  . Sexual activity: Yes    Birth control/protection: Implant    Comment: 1ST INTERCOURSE- 45, PARTNERS - 6 . Nexplanon 06/27/2018  Lifestyle  . Physical activity    Days per week: Not on file    Minutes per session: Not on file  . Stress: Not on file  Relationships  . Social Musician on phone: Not on file    Gets together: Not on file    Attends religious service: Not on file    Active member of club or organization: Not on file    Attends meetings of clubs or organizations: Not on file    Relationship status: Not on file  . Intimate partner violence    Fear of current  or ex partner: Not on file    Emotionally abused: Not on file    Physically abused: Not on file    Forced sexual activity: Not on file  Other Topics Concern  . Not on file  Social History Narrative   Student.  Not married.  Lives alone in a one story home.   Not working.    Past Medical History, Surgical history, Social history, and Family history were reviewed and updated as appropriate.   Please see review of systems for further details on the patient's review from today.   Objective:   Physical Exam:  There were no vitals taken for this visit.  Physical Exam Constitutional:      General: She is not in acute distress.    Appearance: She is well-developed.  Musculoskeletal:        General: No deformity.  Neurological:     Mental Status: She is alert and oriented to person, place, and time.     Coordination: Coordination normal.  Psychiatric:        Attention and Perception: Attention and perception normal. She does not perceive auditory or visual hallucinations.        Mood and Affect: Mood is depressed. Mood is not anxious. Affect is flat. Affect is not labile, blunt, angry or inappropriate.        Speech: Speech normal.        Behavior: Behavior normal.        Thought Content: Thought content normal. Thought content is not  paranoid or delusional. Thought content does not include homicidal or suicidal ideation. Thought content does not include homicidal or suicidal plan.        Cognition and Memory: Cognition and memory normal.        Judgment: Judgment normal.     Comments: Insight intact     Lab Review:     Component Value Date/Time   NA 140 10/26/2017 0607   K 3.2 (L) 10/26/2017 0607   CL 98 10/26/2017 0607   CO2 30 10/26/2017 0607   GLUCOSE 251 (H) 10/26/2017 0607   BUN 14 10/26/2017 0607   CREATININE 0.75 10/26/2017 0607   CREATININE 0.55 12/25/2013 1455   CALCIUM 8.6 (L) 10/26/2017 0607   PROT 7.0 10/26/2017 0607   ALBUMIN 3.3 (L) 10/26/2017 0607   AST 25 10/26/2017 0607   ALT 18 10/26/2017 0607   ALKPHOS 85 10/26/2017 0607   BILITOT 0.9 10/26/2017 0607   GFRNONAA >60 10/26/2017 0607   GFRAA >60 10/26/2017 0607       Component Value Date/Time   WBC 8.5 10/26/2017 0607   RBC 3.85 (L) 10/26/2017 0607   HGB 11.4 (L) 10/26/2017 0607   HCT 34.7 (L) 10/26/2017 0607   PLT 358 10/26/2017 0607   MCV 90.1 10/26/2017 0607   MCV 97.7 (A) 05/24/2013 2021   MCH 29.6 10/26/2017 0607   MCHC 32.9 10/26/2017 0607   RDW 13.9 10/26/2017 0607   LYMPHSABS 3.4 10/26/2017 0607   MONOABS 0.9 10/26/2017 0607   EOSABS 0.1 10/26/2017 0607   BASOSABS 0.1 10/26/2017 0607    No results found for: POCLITH, LITHIUM   No results found for: PHENYTOIN, PHENOBARB, VALPROATE, CBMZ   .res Assessment: Plan:    RECOMMENDATIONS:  Discontinue Adderall 20 mg 1 every morning and 1/2 p.o. in the afternoon - not taking afternoon dose. Discontinue Xanax 0.5 mg half to 1 daily as needed - does not take Continue NAC 600 mg 1 twice daily. Continue Effexor  XR 75 mg, 2 p.o. every morning. Add Wellbutrin XL 150mg  daily x 7 days, then 300mg  daily.  Return in 3 months.  Diagnoses and all orders for this visit:  Attention deficit hyperactivity disorder (ADHD), other type -     buPROPion (WELLBUTRIN XL) 150 MG 24 hr  tablet; Take one tablet daily x 7 days, then two tablets daily.  Major depressive disorder, recurrent episode, moderate (HCC) -     buPROPion (WELLBUTRIN XL) 150 MG 24 hr tablet; Take one tablet daily x 7 days, then two tablets daily.  Generalized anxiety disorder -     buPROPion (WELLBUTRIN XL) 150 MG 24 hr tablet; Take one tablet daily x 7 days, then two tablets daily.  Insomnia, unspecified type     Please see After Visit Summary for patient specific instructions.  Future Appointments  Date Time Provider Department Center  07/18/2019  2:00 PM , MD [facility] None    No orders of the defined types were placed in this encounter.   -------------------------------

## 2019-03-01 ENCOUNTER — Other Ambulatory Visit: Payer: Self-pay | Admitting: Adult Health

## 2019-03-01 DIAGNOSIS — F411 Generalized anxiety disorder: Secondary | ICD-10-CM

## 2019-03-01 DIAGNOSIS — F331 Major depressive disorder, recurrent, moderate: Secondary | ICD-10-CM

## 2019-03-01 DIAGNOSIS — F908 Attention-deficit hyperactivity disorder, other type: Secondary | ICD-10-CM

## 2019-03-03 NOTE — Telephone Encounter (Signed)
Has apt 12/29

## 2019-03-06 ENCOUNTER — Ambulatory Visit: Payer: BC Managed Care – PPO | Admitting: Adult Health

## 2019-03-07 ENCOUNTER — Ambulatory Visit: Payer: BC Managed Care – PPO | Admitting: Adult Health

## 2019-03-08 ENCOUNTER — Other Ambulatory Visit: Payer: Self-pay

## 2019-03-08 ENCOUNTER — Encounter: Payer: Self-pay | Admitting: Adult Health

## 2019-03-08 ENCOUNTER — Ambulatory Visit (INDEPENDENT_AMBULATORY_CARE_PROVIDER_SITE_OTHER): Payer: BC Managed Care – PPO | Admitting: Adult Health

## 2019-03-08 DIAGNOSIS — F908 Attention-deficit hyperactivity disorder, other type: Secondary | ICD-10-CM | POA: Diagnosis not present

## 2019-03-08 DIAGNOSIS — G4700 Insomnia, unspecified: Secondary | ICD-10-CM | POA: Diagnosis not present

## 2019-03-08 DIAGNOSIS — F331 Major depressive disorder, recurrent, moderate: Secondary | ICD-10-CM | POA: Diagnosis not present

## 2019-03-08 DIAGNOSIS — F411 Generalized anxiety disorder: Secondary | ICD-10-CM | POA: Diagnosis not present

## 2019-03-08 MED ORDER — BUPROPION HCL ER (XL) 150 MG PO TB24: ORAL_TABLET | ORAL | 5 refills | Status: DC

## 2019-03-08 MED ORDER — AMPHETAMINE-DEXTROAMPHETAMINE 20 MG PO TABS: ORAL_TABLET | ORAL | 0 refills | Status: DC

## 2019-03-08 NOTE — Progress Notes (Signed)
[name] [id number] 06-24-1990 28 y.o.  Subjective:   Patient ID:  Theresa [name] is a 28 y.o. (DOB 09-09-1990) female.  Chief Complaint: No chief complaint on file.   HPI [name] presents to the office today for follow-up of anxiety, depression, ADHD, and insomnia.   Describes mood today as "ok". Pleasant. Flat. Mood symptoms - reports decreased depression, anxiety, and irritability. Stating "I can tell a difference with the Wellbutrin". Fibromyalgia - "pretty stable at the moment" - diagnosed 5 years ago. Stating "the Fibromyalgia gets worse the colder it gets". Stayed home with dog for the holidays. Improved interest and motivation. Taking other medications as prescribed.  Energy levels "a little better". Active, has a regular exercise routine. Works full-time out of her home - [place] - it's slow right now". Enjoys some usual interests and activities. Lives alone with dog - "[name]". Spending time with family - cousins local.  Appetite decreased. Weight stable. Sleeping better some nights than others - "it's improved". Averages 5 hours. Focus and concentration mostly stable - using Adderall on days she needs it. Completing tasks. Managing aspects of household.  Denies SI or HI. Denies AH or VH.  Past medications for mental health diagnoses include: Elavil, Cymbalta, gabapentin caused swelling in her lower extremities, Horizant, Topamax, baclofen, Lunesta, Belsomra was effective, trazodone caused increased glucose sonar caused increased glucose, Modafinildid not help.  Allergies: Aloe, Duloxetine, Gabapentin, Olive oil, Lyrica [pregabalin], Tapentadol, and Zonisamide   PHQ2-9     [facility] 08/24/2016 [facility] Nutrition from 07/20/2016 [facility] Nutrition from 01/15/2014 in [facility]  PHQ-2  Total Score  0  0  0  0       Review of Systems:  Review of Systems  Musculoskeletal: Negative for gait problem.  Neurological: Negative for tremors.  Psychiatric/Behavioral:       Please refer to HPI    Medications: I have reviewed the patient's current medications.  Current Outpatient Medications  Medication Sig Dispense Refill  . 5-Hydroxytryptophan (5-HTP PO) Take by mouth.    . Acetylcysteine 600 MG CAPS Take 1 capsule (600 mg total) by mouth 2 (two) times daily. 60 capsule 0  . amphetamine-dextroamphetamine (ADDERALL) 20 MG tablet 1 q am, 1/2 q afternoon 45 tablet 0  . amphetamine-dextroamphetamine (ADDERALL) 20 MG tablet 1 q am, 1/2 q afternoon. 45 tablet 0  . amphetamine-dextroamphetamine (ADDERALL) 20 MG tablet 1 po q am, 1/2 at noon. 45 tablet 0  . ASHWAGANDHA PO Take by mouth.    . B-COMPLEX-C PO Take by mouth.    Marland Kitchen buPROPion (WELLBUTRIN XL) 150 MG 24 hr tablet Take three tablets every morning. 90 tablet 5  . cetirizine (ZYRTEC) 10 MG tablet Take 10 mg by mouth daily.    . cyclobenzaprine (FLEXERIL) 10 MG tablet Take 10 mg by mouth at bedtime.  1  . estradiol (ESTRACE) 1 MG tablet Take 1 tablet (1 mg total) by mouth daily. 30 tablet 0  . etonogestrel (NEXPLANON) 68 MG IMPL implant 1 each by Subdermal route once. March 2017    . famotidine (PEPCID) 20 MG tablet Take 1 tablet (20 mg total) by mouth 2 (two) times daily. 60 tablet 0  . Ginkgo Biloba Extract 60 MG CAPS Take by mouth.    . insulin aspart (NOVOLOG FLEXPEN) 100 UNIT/ML FlexPen Inject 5-20 Units into the skin 3 (three) times daily with  meals. Sliding scale    . Insulin Degludec (TRESIBA FLEXTOUCH) 100 UNIT/ML SOPN Inject 55 Units into the skin daily.     Marland Kitchen. NALTREXONE HCL PO Take by mouth. For FM pain.    Marland Kitchen. ondansetron (ZOFRAN-ODT) 4 MG disintegrating tablet Take 4 mg by mouth every 8 (eight) hours as needed for nausea or vomiting.   0  . oxyCODONE-acetaminophen (PERCOCET) 7.5-325 MG tablet Take 1 tablet by mouth  every 6 (six) hours as needed for severe pain. for pain  0  . Turmeric (RA TURMERIC) 500 MG CAPS Take 500 mg by mouth daily.     Marland Kitchen. venlafaxine XR (EFFEXOR-XR) 75 MG 24 hr capsule Take 150 mg by mouth daily with breakfast.     No current facility-administered medications for this visit.    Medication Side Effects: None  Allergies:  Allergies  Allergen Reactions  . Aloe Hives  . Duloxetine Other (See Comments)    RESTLESSNESS URGES TO MOVE  . Gabapentin Swelling    SWELLING REACTION OF FEET  . Olive Oil Hives  . Lyrica [Pregabalin] Other (See Comments)    Heart palpitations Fainting  . Tapentadol Swelling  . Zonisamide Other (See Comments)    WORSENING PAIN    Past Medical History:  Diagnosis Date  . Fibromyalgia   . Headache   . Insomnia   . Insulin dependent type 1 diabetes mellitus (HCC)   . 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  Recommend follow-up Pap smear/HPV at next annual exam.  . Peripheral neuropathy   . Seasonal allergies   . 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 Diabetes Mother   . Migraines Mother   . Hypertension Mother   . CVA Mother   . Breast cancer Maternal Grandmother   . Thyroid disease Maternal Grandmother   . Cancer Maternal Grandfather     Social History   Socioeconomic History  . Marital status: Single    Spouse name: Not on file  . Number of children: Not on file  . Years of education: Not on file  . Highest education level: Not on file  Occupational History  . Not on file  Tobacco Use  . Smoking status: Never Smoker  . Smokeless tobacco: Never Used  Substance and Sexual Activity  . Alcohol use: No    Alcohol/week: 0.0 standard drinks  . Drug use: No  . Sexual activity: Yes    Birth control/protection: Implant    Comment: 1ST INTERCOURSE- [age], PARTNERS - 6 . Nexplanon 06/27/2018   Other Topics Concern  . Not on file  Social History Narrative   Student.  Not married.  Lives alone in a one story home.   Not working.   Social Determinants of Health   Financial Resource Strain:   . Difficulty of Paying Living Expenses: Not on file  Food Insecurity:   . Worried About Programme researcher, broadcasting/film/videounning Out of Food in the Last Year: Not on file  . Ran Out of Food in the Last Year: Not on file  Transportation Needs:   . Lack of Transportation (Medical): Not on file  . Lack of Transportation (Non-Medical): Not on file  Physical Activity:   . Days of Exercise per Week: Not on file  . Minutes of Exercise per Session: Not on file  Stress:   . Feeling of Stress : Not on file  Social Connections:   . Frequency of Communication with Friends and Family: Not on file  . Frequency of Social Gatherings with Friends and Family: Not on file  . Attends Religious Services: Not on file  . Active Member of Clubs or Organizations: Not on file  . Attends Banker Meetings: Not on file  . Marital Status: Not on file  Intimate Partner Violence:   . Fear of Current or Ex-Partner: Not on file  . Emotionally Abused: Not on file  . Physically Abused: Not on file  . Sexually Abused: Not on file    Past Medical History, Surgical history, Social history, and Family history were reviewed and updated as appropriate.   Please see review of systems for further details on the patient's review from today.   Objective:   Physical Exam:  There were no vitals taken for this visit.  Physical Exam Constitutional:      General: She is not in acute distress.    Appearance: She is well-developed.  Musculoskeletal:        General: No deformity.  Neurological:     Mental Status: She is alert and oriented to person, place, and time.     Coordination: Coordination normal.  Psychiatric:        Attention and Perception: Attention and perception normal. She does not perceive auditory or visual hallucinations.         Mood and Affect: Mood is anxious and depressed. Affect is not labile, blunt, angry or inappropriate.        Speech: Speech normal.        Behavior: Behavior normal.        Thought Content: Thought content normal. Thought content is not paranoid or delusional. Thought content does not include homicidal or suicidal ideation. Thought content does not include homicidal or suicidal plan.        Cognition and Memory: Cognition and memory normal.        Judgment: Judgment normal.     Comments: Insight intact     Lab Review:     Component Value Date/Time   NA 140 10/26/2017 0607   K 3.2 (L) 10/26/2017 0607   CL 98 10/26/2017 0607   CO2 30 10/26/2017 0607   GLUCOSE 251 (H) 10/26/2017 0607   BUN 14 10/26/2017 0607   CREATININE 0.75 10/26/2017 0607   CREATININE 0.55 12/25/2013 1455   CALCIUM 8.6 (L) 10/26/2017 0607   PROT 7.0 10/26/2017 0607   ALBUMIN 3.3 (L) 10/26/2017 0607   AST 25 10/26/2017 0607   ALT 18 10/26/2017 0607   ALKPHOS 85 10/26/2017 0607   BILITOT 0.9 10/26/2017 0607   GFRNONAA >60 10/26/2017 0607   GFRAA >60 10/26/2017 0607       Component Value Date/Time   WBC 8.5 10/26/2017 0607   RBC 3.85 (L) 10/26/2017 0607   HGB 11.4 (L) 10/26/2017 0607   HCT 34.7 (L) 10/26/2017 0607   PLT 358 10/26/2017 0607   MCV 90.1 10/26/2017 0607   MCV 97.7 (A) 05/24/2013 2021   MCH 29.6 10/26/2017 0607   MCHC 32.9 10/26/2017 0607   RDW 13.9 10/26/2017 0607   LYMPHSABS 3.4 10/26/2017 0607   MONOABS 0.9 10/26/2017 0607   EOSABS 0.1 10/26/2017 0607   BASOSABS 0.1 10/26/2017 0607    No results found for: POCLITH, LITHIUM   No results found for: PHENYTOIN, PHENOBARB, VALPROATE, CBMZ   .res Assessment: Plan:    Adderall 20 mg 1 every morning  Continue NAC 600  mg 1 twice daily. Continue Effexor XR 75 mg, 2 p.o. every morning. Increase Wellbutrin XL 300mg  daily to 450mg  daily.   Return in 3 months.  Discussed potential benefits, risks, and side effects of stimulants  with patient to include increased heart rate, palpitations, insomnia, increased anxiety, increased irritability, or decreased appetite.  Instructed patient to contact office if experiencing any significant tolerability issues.  Patient advised to contact office with any questions, adverse effects, or acute worsening in signs and symptoms.  Diagnoses and all orders for this visit:  Attention deficit hyperactivity disorder (ADHD), other type -     buPROPion (WELLBUTRIN XL) 150 MG 24 hr tablet; Take three tablets every morning. -     amphetamine-dextroamphetamine (ADDERALL) 20 MG tablet; 1 po q am, 1/2 at noon.  Major depressive disorder, recurrent episode, moderate (HCC) -     buPROPion (WELLBUTRIN XL) 150 MG 24 hr tablet; Take three tablets every morning.  Generalized anxiety disorder -     buPROPion (WELLBUTRIN XL) 150 MG 24 hr tablet; Take three tablets every morning.     Please see After Visit Summary for patient specific instructions.  Future Appointments  Date Time Provider Department Center  07/18/2019  2:00 PM , MD [facility] None    No orders of the defined types were placed in this encounter.   -------------------------------

## 2019-04-02 ENCOUNTER — Encounter (HOSPITAL_COMMUNITY): Payer: Self-pay

## 2019-04-02 ENCOUNTER — Other Ambulatory Visit: Payer: Self-pay

## 2019-04-02 DIAGNOSIS — K8020 Calculus of gallbladder without cholecystitis without obstruction: Secondary | ICD-10-CM | POA: Insufficient documentation

## 2019-04-02 DIAGNOSIS — R1084 Generalized abdominal pain: Secondary | ICD-10-CM | POA: Insufficient documentation

## 2019-04-02 DIAGNOSIS — Z20822 Contact with and (suspected) exposure to covid-19: Secondary | ICD-10-CM | POA: Diagnosis not present

## 2019-04-02 DIAGNOSIS — R112 Nausea with vomiting, unspecified: Principal | ICD-10-CM | POA: Insufficient documentation

## 2019-04-02 DIAGNOSIS — K759 Inflammatory liver disease, unspecified: Secondary | ICD-10-CM | POA: Diagnosis not present

## 2019-04-02 DIAGNOSIS — M797 Fibromyalgia: Secondary | ICD-10-CM | POA: Diagnosis not present

## 2019-04-02 DIAGNOSIS — F419 Anxiety disorder, unspecified: Secondary | ICD-10-CM | POA: Insufficient documentation

## 2019-04-02 DIAGNOSIS — E1042 Type 1 diabetes mellitus with diabetic polyneuropathy: Secondary | ICD-10-CM | POA: Diagnosis not present

## 2019-04-02 DIAGNOSIS — Z794 Long term (current) use of insulin: Secondary | ICD-10-CM | POA: Insufficient documentation

## 2019-04-02 DIAGNOSIS — E860 Dehydration: Secondary | ICD-10-CM | POA: Diagnosis not present

## 2019-04-02 DIAGNOSIS — Z79899 Other long term (current) drug therapy: Secondary | ICD-10-CM | POA: Diagnosis not present

## 2019-04-02 DIAGNOSIS — R7989 Other specified abnormal findings of blood chemistry: Secondary | ICD-10-CM | POA: Diagnosis not present

## 2019-04-02 MED ORDER — SODIUM CHLORIDE 0.9% FLUSH
3.0000 mL | Freq: Once | INTRAVENOUS | Status: AC
  Administered 2019-04-03: 3 mL via INTRAVENOUS

## 2019-04-02 NOTE — ED Triage Notes (Signed)
Patient arrived via [facility] with complaints of abdominal pain, nausea, and vomiting that started at noon today. Prescription medication at home but states she has been unable to keep it down.

## 2019-04-03 ENCOUNTER — Emergency Department (HOSPITAL_COMMUNITY): Payer: BC Managed Care – PPO

## 2019-04-03 ENCOUNTER — Other Ambulatory Visit: Payer: Self-pay | Admitting: Adult Health

## 2019-04-03 ENCOUNTER — Observation Stay (HOSPITAL_COMMUNITY): Payer: BC Managed Care – PPO

## 2019-04-03 ENCOUNTER — Observation Stay (HOSPITAL_COMMUNITY)
Admission: EM | Admit: 2019-04-03 | Discharge: 2019-04-04 | Disposition: A | Discharge status: Home / Self Care | Payer: BC Managed Care – PPO | Attending: Internal Medicine | Admitting: Internal Medicine

## 2019-04-03 ENCOUNTER — Encounter (HOSPITAL_COMMUNITY): Payer: Self-pay | Admitting: Emergency Medicine

## 2019-04-03 DIAGNOSIS — F908 Attention-deficit hyperactivity disorder, other type: Secondary | ICD-10-CM

## 2019-04-03 DIAGNOSIS — M797 Fibromyalgia: Secondary | ICD-10-CM | POA: Diagnosis present

## 2019-04-03 DIAGNOSIS — E860 Dehydration: Secondary | ICD-10-CM

## 2019-04-03 DIAGNOSIS — R112 Nausea with vomiting, unspecified: Secondary | ICD-10-CM | POA: Diagnosis not present

## 2019-04-03 DIAGNOSIS — R109 Unspecified abdominal pain: Secondary | ICD-10-CM

## 2019-04-03 DIAGNOSIS — E109 Type 1 diabetes mellitus without complications: Secondary | ICD-10-CM | POA: Diagnosis present

## 2019-04-03 DIAGNOSIS — F411 Generalized anxiety disorder: Secondary | ICD-10-CM

## 2019-04-03 DIAGNOSIS — R1084 Generalized abdominal pain: Secondary | ICD-10-CM | POA: Diagnosis present

## 2019-04-03 DIAGNOSIS — F419 Anxiety disorder, unspecified: Secondary | ICD-10-CM | POA: Diagnosis present

## 2019-04-03 DIAGNOSIS — K759 Inflammatory liver disease, unspecified: Secondary | ICD-10-CM

## 2019-04-03 DIAGNOSIS — F418 Other specified anxiety disorders: Secondary | ICD-10-CM | POA: Diagnosis present

## 2019-04-03 DIAGNOSIS — F331 Major depressive disorder, recurrent, moderate: Secondary | ICD-10-CM

## 2019-04-03 LAB — URINALYSIS, ROUTINE W REFLEX MICROSCOPIC
Bilirubin Urine: NEGATIVE
Glucose, UA: >=500 mg/dL — AB
Hgb urine dipstick: NEGATIVE
Ketones, ur: 80 mg/dL — AB
Leukocytes,Ua: NEGATIVE
Nitrite: NEGATIVE
Protein, ur: 30 mg/dL — AB
Specific Gravity, Urine: 1.031 — ABNORMAL HIGH (ref 1.005–1.030)
pH: 8 (ref 5.0–8.0)

## 2019-04-03 LAB — CBC
HCT: 37.4 % (ref 36.0–46.0)
HCT: 34.7 % — ABNORMAL LOW (ref 36.0–46.0)
Hemoglobin: 12.4 g/dL (ref 12.0–15.0)
Hemoglobin: 11.1 g/dL — ABNORMAL LOW (ref 12.0–15.0)
MCH: 30.9 pg (ref 26.0–34.0)
MCH: 30.7 pg (ref 26.0–34.0)
MCHC: 33.2 g/dL (ref 30.0–36.0)
MCHC: 32 g/dL (ref 30.0–36.0)
MCV: 93.3 fL (ref 80.0–100.0)
MCV: 96.1 fL (ref 80.0–100.0)
Platelets: 329 10*3/uL (ref 150–400)
Platelets: 327 10*3/uL (ref 150–400)
RBC: 4.01 MIL/uL (ref 3.87–5.11)
RBC: 3.61 MIL/uL — ABNORMAL LOW (ref 3.87–5.11)
RDW: 12.6 % (ref 11.5–15.5)
RDW: 12.8 % (ref 11.5–15.5)
WBC: 8.7 10*3/uL (ref 4.0–10.5)
WBC: 7.3 10*3/uL (ref 4.0–10.5)
nRBC: 0 % (ref 0.0–0.2)
nRBC: 0 % (ref 0.0–0.2)

## 2019-04-03 LAB — LIPASE, BLOOD: Lipase: 12 U/L (ref 11–51)

## 2019-04-03 LAB — COMPREHENSIVE METABOLIC PANEL
ALT: 378 U/L — ABNORMAL HIGH (ref 0–44)
ALT: 432 U/L — ABNORMAL HIGH (ref 0–44)
AST: 994 U/L — ABNORMAL HIGH (ref 15–41)
AST: 889 U/L — ABNORMAL HIGH (ref 15–41)
Albumin: 4 g/dL (ref 3.5–5.0)
Albumin: 3.8 g/dL (ref 3.5–5.0)
Alkaline Phosphatase: 254 U/L — ABNORMAL HIGH (ref 38–126)
Alkaline Phosphatase: 257 U/L — ABNORMAL HIGH (ref 38–126)
Anion gap: 13 (ref 5–15)
Anion gap: 12 (ref 5–15)
BUN: 13 mg/dL (ref 6–20)
BUN: 15 mg/dL (ref 6–20)
CO2: 24 mmol/L (ref 22–32)
CO2: 23 mmol/L (ref 22–32)
Calcium: 9.7 mg/dL (ref 8.9–10.3)
Calcium: 8.9 mg/dL (ref 8.9–10.3)
Chloride: 100 mmol/L (ref 98–111)
Chloride: 102 mmol/L (ref 98–111)
Creatinine, Ser: 0.67 mg/dL (ref 0.44–1.00)
Creatinine, Ser: 0.81 mg/dL (ref 0.44–1.00)
GFR calc Af Amer: >60 mL/min (ref >60)
GFR calc Af Amer: >60 mL/min (ref >60)
GFR calc non Af Amer: >60 mL/min (ref >60)
GFR calc non Af Amer: >60 mL/min (ref >60)
Glucose, Bld: 326 mg/dL — ABNORMAL HIGH (ref 70–99)
Glucose, Bld: 377 mg/dL — ABNORMAL HIGH (ref 70–99)
Potassium: 4.1 mmol/L (ref 3.5–5.1)
Potassium: 4.3 mmol/L (ref 3.5–5.1)
Sodium: 137 mmol/L (ref 135–145)
Sodium: 137 mmol/L (ref 135–145)
Total Bilirubin: 2.7 mg/dL — ABNORMAL HIGH (ref 0.3–1.2)
Total Bilirubin: 2.6 mg/dL — ABNORMAL HIGH (ref 0.3–1.2)
Total Protein: 8.1 g/dL (ref 6.5–8.1)
Total Protein: 7.3 g/dL (ref 6.5–8.1)

## 2019-04-03 LAB — HEPATITIS PANEL, ACUTE
HCV Ab: NONREACTIVE
Hep A IgM: NONREACTIVE
Hep B C IgM: NONREACTIVE
Hepatitis B Surface Ag: NONREACTIVE

## 2019-04-03 LAB — GLUCOSE, CAPILLARY
Glucose-Capillary: 324 mg/dL — ABNORMAL HIGH (ref 70–99)
Glucose-Capillary: 395 mg/dL — ABNORMAL HIGH (ref 70–99)
Glucose-Capillary: 14 mg/dL — CL (ref 70–99)
Glucose-Capillary: 139 mg/dL — ABNORMAL HIGH (ref 70–99)
Glucose-Capillary: 131 mg/dL — ABNORMAL HIGH (ref 70–99)
Glucose-Capillary: 147 mg/dL — ABNORMAL HIGH (ref 70–99)

## 2019-04-03 LAB — I-STAT BETA HCG BLOOD, ED (MC, WL, AP ONLY): I-stat hCG, quantitative: <5 m[IU]/mL (ref <5)

## 2019-04-03 LAB — RAPID URINE DRUG SCREEN, HOSP PERFORMED
Amphetamines: NOT DETECTED
Barbiturates: NOT DETECTED
Benzodiazepines: NOT DETECTED
Cocaine: NOT DETECTED
Opiates: NOT DETECTED
Tetrahydrocannabinol: NOT DETECTED

## 2019-04-03 LAB — HIV ANTIBODY (ROUTINE TESTING W REFLEX): HIV Screen 4th Generation wRfx: NONREACTIVE

## 2019-04-03 LAB — SARS CORONAVIRUS 2 (TAT 6-24 HRS): SARS Coronavirus 2: NEGATIVE

## 2019-04-03 LAB — ACETAMINOPHEN LEVEL: Acetaminophen (Tylenol), Serum: <10 ug/mL — ABNORMAL LOW (ref 10–30)

## 2019-04-03 LAB — ETHANOL: Alcohol, Ethyl (B): <10 mg/dL (ref <10)

## 2019-04-03 LAB — CBG MONITORING, ED: Glucose-Capillary: 295 mg/dL — ABNORMAL HIGH (ref 70–99)

## 2019-04-03 LAB — SALICYLATE LEVEL: Salicylate Lvl: <7 mg/dL — ABNORMAL LOW (ref 7.0–30.0)

## 2019-04-03 MED ORDER — VENLAFAXINE HCL ER 150 MG PO CP24
150.0000 mg | ORAL_CAPSULE | Freq: Every day | ORAL | Status: DC
  Administered 2019-04-03 – 2019-04-04 (×2): 150 mg via ORAL
  Filled 2019-04-03 (×2): qty 1

## 2019-04-03 MED ORDER — INSULIN GLARGINE 100 UNIT/ML ~~LOC~~ SOLN
55.0000 [IU] | Freq: Every day | SUBCUTANEOUS | Status: DC
  Administered 2019-04-03 – 2019-04-04 (×2): 55 [IU] via SUBCUTANEOUS
  Filled 2019-04-03 (×3): qty 0.55

## 2019-04-03 MED ORDER — KETOROLAC TROMETHAMINE 30 MG/ML IJ SOLN
30.0000 mg | Freq: Once | INTRAMUSCULAR | Status: AC
  Administered 2019-04-03: 30 mg via INTRAVENOUS
  Filled 2019-04-03: qty 1

## 2019-04-03 MED ORDER — INSULIN ASPART 100 UNIT/ML FLEXPEN: 5.0000 [IU] | PEN_INJECTOR | Freq: Three times a day (TID) | SUBCUTANEOUS | Status: DC

## 2019-04-03 MED ORDER — ONDANSETRON HCL 4 MG PO TABS: 4.0000 mg | ORAL_TABLET | Freq: Four times a day (QID) | ORAL | Status: DC | PRN

## 2019-04-03 MED ORDER — HALOPERIDOL LACTATE 5 MG/ML IJ SOLN
5.0000 mg | Freq: Once | INTRAMUSCULAR | Status: AC
  Administered 2019-04-03: 5 mg via INTRAVENOUS
  Filled 2019-04-03: qty 1

## 2019-04-03 MED ORDER — DEXTROSE 50 % IV SOLN
INTRAVENOUS | Status: AC
  Filled 2019-04-03: qty 50

## 2019-04-03 MED ORDER — SODIUM CHLORIDE 0.9 % IV BOLUS
500.0000 mL | Freq: Once | INTRAVENOUS | Status: AC
  Administered 2019-04-03: 500 mL via INTRAVENOUS

## 2019-04-03 MED ORDER — OXYCODONE-ACETAMINOPHEN 7.5-325 MG PO TABS
1.0000 | ORAL_TABLET | Freq: Four times a day (QID) | ORAL | Status: DC | PRN
  Administered 2019-04-03 – 2019-04-04 (×2): 1 via ORAL
  Filled 2019-04-03 (×2): qty 1

## 2019-04-03 MED ORDER — BUPROPION HCL ER (XL) 150 MG PO TB24
150.0000 mg | ORAL_TABLET | Freq: Every day | ORAL | Status: DC
  Administered 2019-04-03 – 2019-04-04 (×2): 150 mg via ORAL
  Filled 2019-04-03 (×2): qty 1

## 2019-04-03 MED ORDER — SODIUM CHLORIDE 0.9 % IV SOLN: INTRAVENOUS | Status: DC

## 2019-04-03 MED ORDER — FAMOTIDINE IN NACL 20-0.9 MG/50ML-% IV SOLN
20.0000 mg | Freq: Once | INTRAVENOUS | Status: AC
  Administered 2019-04-03: 20 mg via INTRAVENOUS
  Filled 2019-04-03: qty 50

## 2019-04-03 MED ORDER — FENTANYL CITRATE (PF) 100 MCG/2ML IJ SOLN
50.0000 ug | Freq: Once | INTRAMUSCULAR | Status: AC
  Administered 2019-04-03: 50 ug via INTRAVENOUS
  Filled 2019-04-03: qty 2

## 2019-04-03 MED ORDER — GADOBUTROL 1 MMOL/ML IV SOLN
10.0000 mL | Freq: Once | INTRAVENOUS | Status: AC | PRN
  Administered 2019-04-03: 10 mL via INTRAVENOUS

## 2019-04-03 MED ORDER — INSULIN ASPART 100 UNIT/ML ~~LOC~~ SOLN
0.0000 [IU] | Freq: Three times a day (TID) | SUBCUTANEOUS | Status: DC
  Administered 2019-04-03: 3 [IU] via SUBCUTANEOUS
  Administered 2019-04-03: 20 [IU] via SUBCUTANEOUS
  Administered 2019-04-03: 15 [IU] via SUBCUTANEOUS
  Administered 2019-04-04: 3 [IU] via SUBCUTANEOUS
  Filled 2019-04-03: qty 0.2

## 2019-04-03 MED ORDER — INSULIN ASPART 100 UNIT/ML ~~LOC~~ SOLN
3.0000 [IU] | Freq: Three times a day (TID) | SUBCUTANEOUS | Status: DC
  Administered 2019-04-03: 3 [IU] via SUBCUTANEOUS
  Filled 2019-04-03: qty 0.03

## 2019-04-03 MED ORDER — KETOROLAC TROMETHAMINE 30 MG/ML IJ SOLN
30.0000 mg | Freq: Four times a day (QID) | INTRAMUSCULAR | Status: DC | PRN
  Administered 2019-04-03: 30 mg via INTRAVENOUS
  Filled 2019-04-03: qty 1

## 2019-04-03 MED ORDER — ONDANSETRON HCL 4 MG/2ML IJ SOLN: 4.0000 mg | Freq: Four times a day (QID) | INTRAMUSCULAR | Status: DC | PRN

## 2019-04-03 MED ORDER — INSULIN DEGLUDEC 100 UNIT/ML ~~LOC~~ SOPN: 55.0000 [IU] | PEN_INJECTOR | Freq: Every day | SUBCUTANEOUS | Status: DC

## 2019-04-03 NOTE — ED Notes (Signed)
Pt transported to MRI 

## 2019-04-03 NOTE — Progress Notes (Signed)
   Follow Up Note  HPI: Please refer to HPI done earlier this morning for further information Briefly 29 year old female with medical history significant for fibromyalgia, type I DM, presents with generalized abdominal pain, nausea/vomiting.  In the ED noted to have elevated LFTs, right upper quadrant showed gallstones, with stone in the neck of the gallbladder but normal CBD.  MRCP was largely unremarkable, with no CBD stone or cholecystitis.  Acute viral hepatitis panel nonreactive.  General surgery consulted, no further recommendation.   Exam:  General: NAD   Cardiovascular: S1, S2 present  Respiratory: CTAB  Abdomen: Soft, mild generalized tenderness, nondistended, bowel sounds present  Musculoskeletal: No bilateral pedal edema noted  Skin: Normal  Psychiatry: Normal mood   Present on Admission: . Anxiety . Chronic pain . Intractable nausea and vomiting . Type 1 diabetes mellitus (HCC) . Abdominal pain, acute, generalized   Plan Trend LFTs, if no significant downward trend may consult GI for further work-up  Type 1 diabetes mellitus, not in DKA, noted hyperglycemia as well as hypoglycemia, continue home regimen, diabetes coordinator consulted

## 2019-04-03 NOTE — ED Notes (Signed)
Attempted to call report. 4W stated nurse not assigned yet, charge nurse will look in to this.

## 2019-04-03 NOTE — ED Notes (Signed)
Pt ambulatory to the restroom in the lobby. Pt called x1 for blood draw, but was in the restroom at this time. Pt then ambulated to registration and informed them that she is back and will me waiting in her a wheelchair.

## 2019-04-03 NOTE — Consult Note (Signed)
Theresa [name] 02/05/1991  [id number].    Requesting MD: Dr. Crist InfanteNkeiruke Ezenduka Chief Complaint/Reason for Consult: elevated LFTs  HPI:  This is a 29 yo black female with a history of type I Diabetes who manages with insulin injections who also has a history of fibromyalgia.  She states about a year ago she was hospitalized for intractable vomiting at which point it was suspected to possibly just be enteritis.  She improved and has had no other abdominal symptoms since that time until yesterday.  She woke up and developed intractable N/V again.  She had some generalized abdominal pain but in no one specific location.  She also had pain in her throat and chest from the vomiting.  She had a BM yesterday morning that was normal and solid.  She denies, fevers, chills, cough, HA, dysuria, etc.  She had not eaten prior to her onset of N/V.  She has never had symptoms of biliary disease/colic.  She presented to the ED where she was treated for her N/V.  This resolved and her abdominal pain improved, but she was ultimately admitted secondary to an elevation of her LFTs.  She then underwent an US which revealed a gallstone, but a normal CBD of 6mm and no evidence of cholecystitis.  She then had an MRCP which was also negative for cholecystitis or choledocholithiasis.  Her LFTs remain elevated today and we have been asked to see her for consideration of biliary etiology.  ROS: ROS: Please see HPI, otherwise all other systems have been reviewed and are negative.  She denies chronic abdominal pain specifically.  Family History  Problem Relation Age of Onset   Diabetes Mother    Migraines Mother    Hypertension Mother    CVA Mother    Breast cancer Maternal Grandmother    Thyroid disease Maternal Grandmother    Cancer Maternal Grandfather     Past Medical History:  Diagnosis Date   Fibromyalgia    Headache    Insomnia    Insulin dependent type 1 diabetes mellitus (HCC)    LGSIL of  cervix of undetermined significance 10/2018   Colposcopy normal with ECC showing atypical fragments.  Recommend follow-up Pap smear/HPV at next annual exam.   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 with culture Hattie Perch/notes 07/11/2013   EYE SURGERY     MASS EXCISION Right 07/10/2013   Procedure: MINOR EXCISION RIGHT NECK CYST;  Surgeon: Drema Halonhristopher E Newman, MD;  Location: [facility];  Service: ENT;  Laterality: Right;   Nexplanon  06/27/2018   PARS PLANA REPAIR OF RETINAL DEATACHMENT Right 11/04/2015   PARS PLANA VITRECTOMY Right 11/04/2015   Procedure: PARS PLANA VITRECTOMY WITH 25 GAUGE TO REPAIR A COMPLEX TRACTION RETINAL DETACHMENT;  Surgeon: Sherrie GeorgeJohn D Matthews, MD;  [facility];  Service: Ophthalmology;  Laterality: Right;   WISDOM TOOTH EXTRACTION  09/2015    Social History:  reports that she has never smoked. She has never used smokeless tobacco. She reports that she does not drink alcohol or use drugs.  Allergies:  Allergies  Allergen Reactions   Aloe Hives   Duloxetine Other (See Comments)    RESTLESSNESS URGES TO  MOVE   Gabapentin Swelling    SWELLING REACTION OF FEET   Olive Oil Hives   Lyrica [Pregabalin] Other (See Comments)    Heart palpitations Fainting   Tapentadol Swelling   Zonisamide Other (See Comments)    WORSENING PAIN    Medications Prior to Admission  Medication Sig Dispense Refill   5-Hydroxytryptophan (5-HTP PO) Take 1 capsule by mouth daily.      ASHWAGANDHA PO Take 1 capsule by mouth daily.      B-COMPLEX-C PO Take 1 capsule by mouth daily.      cetirizine (ZYRTEC) 10 MG tablet Take 10 mg by mouth daily.     etonogestrel  (NEXPLANON) 68 MG IMPL implant 1 each by Subdermal route once. March 2017     Ginkgo Biloba Extract 60 MG CAPS Take 1 capsule by mouth daily.      insulin aspart (NOVOLOG FLEXPEN) 100 UNIT/ML FlexPen Inject 5-20 Units into the skin 3 (three) times daily with meals. Sliding scale     Insulin Degludec (TRESIBA FLEXTOUCH) 100 UNIT/ML SOPN Inject 55 Units into the skin daily.      ondansetron (ZOFRAN-ODT) 4 MG disintegrating tablet Take 4 mg by mouth every 8 (eight) hours as needed for nausea or vomiting.   0   oxyCODONE-acetaminophen (PERCOCET) 7.5-325 MG tablet Take 1 tablet by mouth every 6 (six) hours as needed for severe pain. for pain  0   Turmeric (RA TURMERIC) 500 MG CAPS Take 500 mg by mouth daily.      venlafaxine XR (EFFEXOR-XR) 75 MG 24 hr capsule Take 150 mg by mouth daily with breakfast.     Acetylcysteine 600 MG CAPS Take 1 capsule (600 mg total) by mouth 2 (two) times daily. (Patient not taking: Reported on 04/03/2019) 60 capsule 0   amphetamine-dextroamphetamine (ADDERALL) 20 MG tablet 1 q am, 1/2 q afternoon (Patient not taking: Reported on 04/03/2019) 45 tablet 0   amphetamine-dextroamphetamine (ADDERALL) 20 MG tablet 1 q am, 1/2 q afternoon. (Patient not taking: Reported on 04/03/2019) 45 tablet 0   amphetamine-dextroamphetamine (ADDERALL) 20 MG tablet 1 po q am, 1/2 at noon. (Patient not taking: Reported on 04/03/2019) 45 tablet 0   estradiol (ESTRACE) 1 MG tablet Take 1 tablet (1 mg total) by mouth daily. (Patient not taking: Reported on 04/03/2019) 30 tablet 0   famotidine (PEPCID) 20 MG tablet Take 1 tablet (20 mg total) by mouth 2 (two) times daily. 60 tablet 0     Physical Exam: Blood pressure 119/71, pulse (!) 115, temperature 98.2 F (36.8 C), temperature source Oral, resp. rate 20, height 5\' 4"  (1.626 m), weight 89.3 kg, SpO2 99 %. General: pleasant, mildy obese black female who is laying in bed in NAD HEENT: head is normocephalic, atraumatic.  Sclera are  noninjected.  PERRL.  Ears and nose without any masses or lesions.  Mouth is pink and moist Heart: regular rhythm, but tachycardic.  Normal s1,s2. No obvious murmurs, gallops, or rubs noted.  Palpable radial and pedal pulses bilaterally Lungs: CTAB, no wheezes, rhonchi, or rales noted.  Respiratory effort nonlabored Abd: soft, tender mildly in her epigastrium as well as her BLQ, no guarding or rebounding, no peritoneal signs.  Specifically nontender in the RUQ. ND, +BS, no masses, hernias, or organomegaly MS: all 4 extremities are symmetrical with no cyanosis, clubbing, or edema. Skin: warm and dry with no masses, lesions, or rashes Neuro: Cranial nerves 2-12 grossly intact, speech is normal Psych: A&Ox3 with an appropriate affect.  Results for orders placed or performed during the hospital encounter of 04/03/19 (from the past 48 hour(s))  CBG monitoring, ED     Status: Abnormal   Collection Time: 04/03/19 12:32 AM  Result Value Ref Range   Glucose-Capillary 295 (H) 70 - 99 mg/dL  I-Stat beta hCG blood, ED     Status: None   Collection Time: 04/03/19 12:47 AM  Result Value Ref Range   I-stat hCG, quantitative <5.0 <5 mIU/mL   Comment 3            Comment:   GEST. AGE      CONC.  (mIU/mL)   <=1 WEEK        5 - 50     2 WEEKS       50 - 500     3 WEEKS       100 - 10,000     4 WEEKS     1,000 - 30,000        FEMALE AND NON-PREGNANT FEMALE:     LESS THAN 5 mIU/mL   Lipase, blood     Status: None   Collection Time: 04/03/19 12:50 AM  Result Value Ref Range   Lipase 12 11 - 51 U/L    Comment: Performed at [facility], [address] [place], [place] [address]  Comprehensive metabolic panel     Status: Abnormal   Collection Time: 04/03/19 12:50 AM  Result Value Ref Range   Sodium 137 135 - 145 mmol/L   Potassium 4.1 3.5 - 5.1 mmol/L   Chloride 100 98 - 111 mmol/L   CO2 24 22 - 32 mmol/L   Glucose, Bld 326 (H) 70 - 99 mg/dL   BUN 13 6 - 20 mg/dL   Creatinine,  Ser 0.09 0.44 - 1.00 mg/dL   Calcium 9.7 8.9 - 23.3 mg/dL   Total Protein 8.1 6.5 - 8.1 g/dL   Albumin 4.0 3.5 - 5.0 g/dL   AST 007 (H) 15 - 41 U/L   ALT 378 (H) 0 - 44 U/L   Alkaline Phosphatase 254 (H) 38 - 126 U/L   Total Bilirubin 2.7 (H) 0.3 - 1.2 mg/dL   GFR calc non Af Amer >60 >60 mL/min   GFR calc Af Amer >60 >60 mL/min   Anion gap 13 5 - 15    Comment: Performed at [facility], [name] [place], [place] [address]  CBC     Status: None   Collection Time: 04/03/19 12:50 AM  Result Value Ref Range   WBC 8.7 4.0 - 10.5 K/uL   RBC 4.01 3.87 - 5.11 MIL/uL   Hemoglobin 12.4 12.0 - 15.0 g/dL   HCT 33.5 45.6 - 25.6 %   MCV 93.3 80.0 - 100.0 fL   MCH 30.9 26.0 - 34.0 pg   MCHC 33.2 30.0 - 36.0 g/dL   RDW 38.9 37.3 - 42.8 %   Platelets 329 150 - 400 K/uL   nRBC 0.0 0.0 - 0.2 %    Comment: Performed at [facility], [address], [place], [place] [address]  Urinalysis, Routine w reflex microscopic     Status: Abnormal   Collection Time: 04/03/19  1:56 AM  Result Value Ref Range   Color, Urine YELLOW YELLOW   APPearance CLEAR CLEAR   Specific Gravity, Urine 1.031 (H) 1.005 - 1.030   pH 8.0 5.0 - 8.0   Glucose, UA >=500 (A) NEGATIVE mg/dL  Hgb urine dipstick NEGATIVE NEGATIVE   Bilirubin Urine NEGATIVE NEGATIVE   Ketones, ur 80 (A) NEGATIVE mg/dL   Protein, ur 30 (A) NEGATIVE mg/dL   Nitrite NEGATIVE NEGATIVE   Leukocytes,Ua NEGATIVE NEGATIVE   RBC / HPF 0-5 0 - 5 RBC/hpf   WBC, UA 0-5 0 - 5 WBC/hpf   Bacteria, UA RARE (A) NONE SEEN   Squamous Epithelial / LPF 0-5 0 - 5    Comment: Performed at [facility], [place] [address] [place], [place] [address]  Rapid urine drug screen (hospital performed)     Status: None   Collection Time: 04/03/19  1:56 AM  Result Value Ref Range   Opiates NONE DETECTED NONE DETECTED   Cocaine NONE DETECTED NONE DETECTED   Benzodiazepines NONE DETECTED NONE DETECTED    Amphetamines NONE DETECTED NONE DETECTED   Tetrahydrocannabinol NONE DETECTED NONE DETECTED   Barbiturates NONE DETECTED NONE DETECTED    Comment: (NOTE) DRUG SCREEN FOR MEDICAL PURPOSES ONLY.  IF CONFIRMATION IS NEEDED FOR ANY PURPOSE, NOTIFY LAB WITHIN 5 DAYS. LOWEST DETECTABLE LIMITS FOR URINE DRUG SCREEN Drug Class                     Cutoff (ng/mL) Amphetamine and metabolites    1000 Barbiturate and metabolites    200 Benzodiazepine                 062 Tricyclics and metabolites     300 Opiates and metabolites        300 Cocaine and metabolites        300 THC                            50 Performed at [facility], [place] [address], [place], [place] [address]   Ethanol     Status: None   Collection Time: 04/03/19  2:32 AM  Result Value Ref Range   Alcohol, Ethyl (B) <10 <10 mg/dL    Comment: (NOTE) Lowest detectable limit for serum alcohol is 10 mg/dL. For medical purposes only. Performed at [facility], [place] [address], [place], [place] [address]   Acetaminophen level     Status: Abnormal   Collection Time: 04/03/19  2:45 AM  Result Value Ref Range   Acetaminophen (Tylenol), Serum <10 (L) 10 - 30 ug/mL    Comment: (NOTE) Therapeutic concentrations vary significantly. A range of 10-30 ug/mL  may be an effective concentration for many patients. However, some  are best treated at concentrations outside of this range. Acetaminophen concentrations >150 ug/mL at 4 hours after ingestion  and >50 ug/mL at 12 hours after ingestion are often associated with  toxic reactions. Performed at [facility], [place] [address], [place], [place] [address]   Salicylate level     Status: Abnormal   Collection Time: 04/03/19  2:45 AM  Result Value Ref Range   Salicylate Lvl <3.8 (L) 7.0 - 30.0 mg/dL    Comment: Performed at [facility], [place] [address] [place], [place] [address]  Hepatitis panel, acute      Status: None   Collection Time: 04/03/19  2:45 AM  Result Value Ref Range   Hepatitis B Surface Ag NON REACTIVE NON REACTIVE   HCV Ab NON REACTIVE NON REACTIVE    Comment: (NOTE) Nonreactive HCV antibody screen is consistent with no HCV infections,  unless recent infection is suspected  or other evidence exists to indicate HCV infection.    Hep A IgM NON REACTIVE NON REACTIVE   Hep B C IgM NON REACTIVE NON REACTIVE    Comment: Performed at [facility], [address] [place], [place] [address]  HIV Antibody (routine testing w rflx)     Status: None   Collection Time: 04/03/19  5:00 AM  Result Value Ref Range   HIV Screen 4th Generation wRfx NON REACTIVE NON REACTIVE    Comment: Performed at [facility], [address] [place], [place] [address]  Comprehensive metabolic panel     Status: Abnormal   Collection Time: 04/03/19  5:00 AM  Result Value Ref Range   Sodium 137 135 - 145 mmol/L   Potassium 4.3 3.5 - 5.1 mmol/L   Chloride 102 98 - 111 mmol/L   CO2 23 22 - 32 mmol/L   Glucose, Bld 377 (H) 70 - 99 mg/dL   BUN 15 6 - 20 mg/dL   Creatinine, Ser 0.980.81 0.44 - 1.00 mg/dL   Calcium 8.9 8.9 - 11.910.3 mg/dL   Total Protein 7.3 6.5 - 8.1 g/dL   Albumin 3.8 3.5 - 5.0 g/dL   AST 147889 (H) 15 - 41 U/L   ALT 432 (H) 0 - 44 U/L   Alkaline Phosphatase 257 (H) 38 - 126 U/L   Total Bilirubin 2.6 (H) 0.3 - 1.2 mg/dL   GFR calc non Af Amer >60 >60 mL/min   GFR calc Af Amer >60 >60 mL/min   Anion gap 12 5 - 15    Comment: Performed at [facility], [address] [place], [place] [address]  CBC     Status: Abnormal   Collection Time: 04/03/19  5:00 AM  Result Value Ref Range   WBC 7.3 4.0 - 10.5 K/uL   RBC 3.61 (L) 3.87 - 5.11 MIL/uL   Hemoglobin 11.1 (L) 12.0 - 15.0 g/dL   HCT 21.334.7 (L) 08.636.0 - 57.846.0 %   MCV 96.1 80.0 - 100.0 fL   MCH 30.7 26.0 - 34.0 pg   MCHC 32.0 30.0 - 36.0 g/dL   RDW 46.912.8 62.911.5 - 52.815.5 %   Platelets 327 150 - 400 K/uL   nRBC 0.0 0.0 -  0.2 %    Comment: Performed at [facility], [address] [place], [place] [address]  Glucose, capillary     Status: Abnormal   Collection Time: 04/03/19  8:36 AM  Result Value Ref Range   Glucose-Capillary 324 (H) 70 - 99 mg/dL  Glucose, capillary     Status: Abnormal   Collection Time: 04/03/19 11:45 AM  Result Value Ref Range   Glucose-Capillary 395 (H) 70 - 99 mg/dL   US Abdomen Complete  Result Date: 04/03/2019 CLINICAL DATA:  Right upper quadrant pain EXAM: ABDOMEN ULTRASOUND COMPLETE COMPARISON:  CT 10/22/2017 FINDINGS: Gallbladder: Small mobile stones within the gallbladder, the largest 6 mm. No wall thickening or sonographic Murphy sign. Common bile duct: Diameter: Normal caliber, 6 mm. Liver: 2 cm echogenic nodule in the left lobe of the liver most compatible with hemangioma. Normal echotexture. Portal vein is patent on color Doppler imaging with normal direction of blood flow towards the liver. IVC: No abnormality visualized. Pancreas: Visualized portion unremarkable. Spleen: Size and appearance within normal limits. Right Kidney: Length: 12.1 cm. Echogenicity within normal limits. No mass or hydronephrosis visualized. Left Kidney: Length: 11.7 cm. Echogenicity within normal limits. No mass or hydronephrosis visualized.  Abdominal aorta: No aneurysm visualized. Other findings: None. IMPRESSION: Cholelithiasis.  No sonographic evidence of acute cholecystitis. 2 cm hyperechoic lesion within the left hepatic lobe most compatible with hemangioma. Electronically Signed   By: Charlett Nose M.D.   On: 04/03/2019 03:17   MR 3D Recon At Scanner  Result Date: 04/03/2019 CLINICAL DATA:  Abdominal pain, cholelithiasis. Probable hemangioma within LEFT hepatic lobe on comparison ultrasound. EXAM: MRI ABDOMEN WITHOUT AND WITH CONTRAST (INCLUDING MRCP) TECHNIQUE: Multiplanar multisequence MR imaging of the abdomen was performed both before and after the administration of intravenous  contrast. Heavily T2-weighted images of the biliary and pancreatic ducts were obtained, and three-dimensional MRCP images were rendered by post processing. CONTRAST:  40mL GADAVIST GADOBUTROL 1 MMOL/ML IV SOLN COMPARISON:  Ultrasound 04/03/2019 FINDINGS: Lower chest:  Lung bases are clear. Hepatobiliary: Multiple small gallstones within lumen of the gallbladder measure approximately 4-5 mm each. There is no gallbladder distension or pericholecystic fluid. No gallbladder wall thickening. One stone is towards the neck of the gall bladder (image 28/3). The common bile duct is normal caliber. No filling defect within the common bile duct. Normal hepatic parenchymal intensity. No discrete lesion identified to correspond to the ultrasound findings. Enhancing lesion present. Pancreas: Normal pancreatic parenchymal intensity. No ductal dilatation or inflammation. Spleen: Normal spleen. Adrenals/urinary tract: Adrenal glands and kidneys are normal. Stomach/Bowel: Stomach and limited of the small bowel is unremarkable Vascular/Lymphatic: Abdominal aortic normal caliber. No retroperitoneal periportal lymphadenopathy. Musculoskeletal: No aggressive osseous lesion IMPRESSION: 1. Cholelithiasis without evidence of cholecystitis. One gallstone is towards the neck of the gallbladder. 2. Normal common bile duct.  No dilatation or choledocholithiasis. 3. Normal pancreas. 4. Hepatic lesion described on comparison ultrasound is not identified on MRI Electronically Signed   By: Genevive Bi M.D.   On: 04/03/2019 08:07   DG Chest Portable 1 View  Result Date: 04/03/2019 CLINICAL DATA:  Abdominal and chest pain EXAM: PORTABLE CHEST 1 VIEW COMPARISON:  None. FINDINGS: Normal heart size and mediastinal contours. Low volume chest without acute infiltrate or edema. No effusion or pneumothorax. No acute osseous findings. IMPRESSION: No active disease. Electronically Signed   By: Marnee Spring M.D.   On: 04/03/2019 04:32   MR  ABDOMEN MRCP W WO CONTAST  Result Date: 04/03/2019 CLINICAL DATA:  Abdominal pain, cholelithiasis. Probable hemangioma within LEFT hepatic lobe on comparison ultrasound. EXAM: MRI ABDOMEN WITHOUT AND WITH CONTRAST (INCLUDING MRCP) TECHNIQUE: Multiplanar multisequence MR imaging of the abdomen was performed both before and after the administration of intravenous contrast. Heavily T2-weighted images of the biliary and pancreatic ducts were obtained, and three-dimensional MRCP images were rendered by post processing. CONTRAST:  80mL GADAVIST GADOBUTROL 1 MMOL/ML IV SOLN COMPARISON:  Ultrasound 04/03/2019 FINDINGS: Lower chest:  Lung bases are clear. Hepatobiliary: Multiple small gallstones within lumen of the gallbladder measure approximately 4-5 mm each. There is no gallbladder distension or pericholecystic fluid. No gallbladder wall thickening. One stone is towards the neck of the gall bladder (image 28/3). The common bile duct is normal caliber. No filling defect within the common bile duct. Normal hepatic parenchymal intensity. No discrete lesion identified to correspond to the ultrasound findings. Enhancing lesion present. Pancreas: Normal pancreatic parenchymal intensity. No ductal dilatation or inflammation. Spleen: Normal spleen. Adrenals/urinary tract: Adrenal glands and kidneys are normal. Stomach/Bowel: Stomach and limited of the small bowel is unremarkable Vascular/Lymphatic: Abdominal aortic normal caliber. No retroperitoneal periportal lymphadenopathy. Musculoskeletal: No aggressive osseous lesion IMPRESSION: 1. Cholelithiasis without evidence of cholecystitis. One gallstone is towards  the neck of the gallbladder. 2. Normal common bile duct.  No dilatation or choledocholithiasis. 3. Normal pancreas. 4. Hepatic lesion described on comparison ultrasound is not identified on MRI Electronically Signed   By: Genevive Bi M.D.   On: 04/03/2019 08:07      Assessment/Plan Type I Diabetes Fibromyalgia    N/V/elevated LFTs/cholelithiasis The patient had a episode yesterday of intractable N/V with some generalized abdominal pain and elevation of her LFTs.  She has had her gallbladder evaluated and worked up with no evidence of choledocholithiasis or cholecystitis.  Her story is not consistent with biliary colic or the passage of a gallstone.  We discussed this could be an atypical presentation of a passage of a gallstone and that we could offer a cholecystectomy but we could not guarantee that was the etiology or prevent any further similar episodes.  The patient is not interested in having an operation at this time.  I think this is reasonable as our suspicion for this being a biliary source is low.  She has had a hepatitis panel which is negative as well.  It is unclear the etiology of her elevated LFTs at this time.  She may benefit from a GI referral for further work up and evaluation.  Given she is improving, as long as her LFTs don't continue to trend up, she could have her diet advanced as tolerated per medicine and follow up with GI as an outpatient.  No plans for surgical intervention at this time. She can call our office as needed.   Letha Cape, [facility] Surgery 04/03/2019, 12:47 PM Please see Amion for pager number during day hours 7:00am-4:30pm or 7:00am -11:30am on weekends

## 2019-04-03 NOTE — ED Provider Notes (Signed)
[facility]-EMERGENCY DEPT Provider Note   CSN: [id number] Arrival date & time: 04/02/19  2248     History Chief Complaint  Patient presents with  . Abdominal Pain    Gilford RileCourtney Veiga is a 29 y.o. female.  The history is provided by the patient.  Abdominal Pain Pain location:  Generalized Pain radiates to:  Does not radiate Pain severity:  Severe Onset quality:  Gradual Timing:  Constant Progression:  Unchanged Chronicity:  New Context: not alcohol use, not awakening from sleep, not diet changes, not laxative use and not trauma   Context comment:  Denies overuse of tylenol products.  Took one tylenol on Sunday.   Relieved by:  Nothing Worsened by:  Nothing Ineffective treatments: home zofran and phenergan. Associated symptoms: nausea and vomiting   Associated symptoms: no belching, no chest pain, no cough, no fatigue and no fever   Risk factors: not pregnant   Patient with type 1 Diabetes and Fibromyalgia presents with abdominal pain and multiple episodes of emesis since noon on Monday.  No f/c/r.  No known covid contacts.  No cough.  No loss of taste or smell.  She denies tylenol or NSAID overuse.       Past Medical History:  Diagnosis Date  . Fibromyalgia   . Headache   . Insomnia   . Insulin dependent type 1 diabetes mellitus (HCC)   . 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  Recommend follow-up Pap smear/HPV at next annual exam.  . Peripheral neuropathy   . Seasonal allergies   . 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 Liver lesion, left lobe 10/26/2017  . Hypokalemia 10/26/2017  . Anxiety 10/26/2017  . 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 Proliferative diabetic retinopathy of both eyes (HCC) 11/04/2015  . Chronic pain 09/29/2013  . Intractable nausea and  vomiting 09/29/2013  . DKA (diabetic ketoacidoses) (HCC) 07/26/2013  . Type 1 diabetes mellitus (HCC) 11/22/2011  . Yeast vaginitis 11/22/2011    Past Surgical History:  Procedure Laterality Date  . CATARACT EXTRACTION W/ INTRAOCULAR LENS  IMPLANT, BILATERAL Bilateral 05/2014 - 06/2014  . 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 EYE SURGERY    . MASS EXCISION Right 07/10/2013   Procedure: MINOR EXCISION RIGHT NECK CYST;  Surgeon: Drema Halonhristopher E Newman, MD;  Location: [facility];  Service: ENT;  Laterality: Right;  . Nexplanon  06/27/2018  . PARS PLANA REPAIR OF RETINAL DEATACHMENT Right 11/04/2015  . PARS PLANA VITRECTOMY Right 11/04/2015   Procedure: PARS PLANA VITRECTOMY WITH 25 GAUGE TO REPAIR A COMPLEX TRACTION RETINAL DETACHMENT;  Surgeon: Sherrie GeorgeJohn D Matthews, MD;  [facility];  Service: Ophthalmology;  Laterality: Right;  . WISDOM TOOTH EXTRACTION  09/2015     OB History    Gravida  1   Para      Term      Preterm      AB  1   Living  0     SAB  1   TAB      Ectopic      Multiple      Live Births              Family History  Problem Relation Age of  Onset  . Diabetes Mother   . Migraines Mother   . Hypertension Mother   . CVA Mother   . Breast cancer Maternal Grandmother   . Thyroid disease Maternal Grandmother   . Cancer Maternal Grandfather     Social History   Tobacco Use  . Smoking status: Never Smoker  . Smokeless tobacco: Never Used  Substance Use Topics  . Alcohol use: No    Alcohol/week: 0.0 standard drinks  . Drug use: No    Home Medications Prior to Admission medications   Medication Sig Start Date End Date Taking? Authorizing Provider  5-Hydroxytryptophan (5-HTP PO) Take 1 capsule by mouth daily.    Yes [provider]  ASHWAGANDHA PO Take 1 capsule by mouth daily.    Yes [provider]  B-COMPLEX-C PO Take 1 capsule by mouth  daily.    Yes [provider]  buPROPion (WELLBUTRIN XL) 150 MG 24 hr tablet Take three tablets every morning. 03/08/19  Yes Mozingo, Thereasa Soloegina Nattalie, NP  cetirizine (ZYRTEC) 10 MG tablet Take 10 mg by mouth daily.   Yes [provider]  etonogestrel (NEXPLANON) 68 MG IMPL implant 1 each by Subdermal route once. March 2017   Yes [provider]  Ginkgo Biloba Extract 60 MG CAPS Take 1 capsule by mouth daily.    Yes [provider]  insulin aspart (NOVOLOG FLEXPEN) 100 UNIT/ML FlexPen Inject 5-20 Units into the skin 3 (three) times daily with meals. Sliding scale   Yes [provider]  Insulin Degludec (TRESIBA FLEXTOUCH) 100 UNIT/ML SOPN Inject 55 Units into the skin daily.  12/14/14  Yes [provider]  ondansetron (ZOFRAN-ODT) 4 MG disintegrating tablet Take 4 mg by mouth every 8 (eight) hours as needed for nausea or vomiting.  10/24/17  Yes [provider]  oxyCODONE-acetaminophen (PERCOCET) 7.5-325 MG tablet Take 1 tablet by mouth every 6 (six) hours as needed for severe pain. for pain 10/20/17  Yes [provider]  Turmeric (RA TURMERIC) 500 MG CAPS Take 500 mg by mouth daily.    Yes [provider]  venlafaxine XR (EFFEXOR-XR) 75 MG 24 hr capsule Take 150 mg by mouth daily with breakfast.   Yes [provider]  Acetylcysteine 600 MG CAPS Take 1 capsule (600 mg total) by mouth 2 (two) times daily. Patient not taking: Reported on 04/03/2019 05/05/18   Melony OverlyHurst, Teresa T, PA-C  amphetamine-dextroamphetamine (ADDERALL) 20 MG tablet 1 q am, 1/2 q afternoon Patient not taking: Reported on 04/03/2019 11/22/18   Melony OverlyHurst, Teresa T, PA-C  amphetamine-dextroamphetamine (ADDERALL) 20 MG tablet 1 q am, 1/2 q afternoon. Patient not taking: Reported on 04/03/2019 12/21/18   Melony OverlyHurst, Teresa T, PA-C  amphetamine-dextroamphetamine (ADDERALL) 20 MG tablet 1 po q am, 1/2 at noon. Patient not taking: Reported on 04/03/2019 03/08/19    Mozingo, Thereasa Soloegina Nattalie, NP  estradiol (ESTRACE) 1 MG tablet Take 1 tablet (1 mg total) by mouth daily. Patient not taking: Reported on 04/03/2019 10/19/18   Fontaine, Nadyne Coombesimothy P, MD  famotidine (PEPCID) 20 MG tablet Take 1 tablet (20 mg total) by mouth 2 (two) times daily. 10/26/17 10/26/18  Lenox PondsSilva Zapata, Edwin, MD    Allergies    Aloe, Duloxetine, Gabapentin, Olive oil, Lyrica [pregabalin], Tapentadol, and Zonisamide  Review of Systems   Review of Systems  Constitutional: Negative for fatigue and fever.  HENT: Negative for congestion.   Eyes: Negative for visual disturbance.  Respiratory: Negative for cough.   Cardiovascular: Negative for chest pain,  palpitations and leg swelling.  Gastrointestinal: Positive for abdominal pain, nausea and vomiting.  Genitourinary: Negative for difficulty urinating.  Musculoskeletal: Negative for myalgias.  Neurological: Negative for dizziness.  Psychiatric/Behavioral: Negative for agitation.  All other systems reviewed and are negative.   Physical Exam Updated Vital Signs BP 131/76   Pulse (!) 109   Temp 98.7 F (37.1 C) (Oral)   Resp (!) 21   Ht 5\' 4"  (1.626 m)   Wt 93.4 kg   SpO2 100%   BMI 35.36 kg/m   Physical Exam Vitals and nursing note reviewed.  Constitutional:      Appearance: Normal appearance. She is not diaphoretic.  HENT:     Head: Normocephalic and atraumatic.     Nose: Nose normal.  Eyes:     Conjunctiva/sclera: Conjunctivae normal.     Pupils: Pupils are equal, round, and reactive to light.  Cardiovascular:     Rate and Rhythm: Regular rhythm. Tachycardia present.     Pulses: Normal pulses.     Heart sounds: Normal heart sounds.  Pulmonary:     Effort: Pulmonary effort is normal.     Breath sounds: Normal breath sounds.  Abdominal:     General: Abdomen is flat. Bowel sounds are normal.     Tenderness: There is no abdominal tenderness. There is no guarding or rebound. Negative signs include Murphy's sign and  McBurney's sign.     Hernia: No hernia is present.  Musculoskeletal:        General: Normal range of motion.     Cervical back: Normal range of motion and neck supple.  Skin:    General: Skin is warm and dry.     Capillary Refill: Capillary refill takes less than 2 seconds.  Neurological:     General: No focal deficit present.     Mental Status: She is alert and oriented to person, place, and time.     Deep Tendon Reflexes: Reflexes normal.  Psychiatric:        Mood and Affect: Mood normal.        Behavior: Behavior normal.     ED Results / Procedures / Treatments   Labs (all labs ordered are listed, but only abnormal results are displayed) Results for orders placed or performed during the hospital encounter of 04/03/19  Lipase, blood  Result Value Ref Range   Lipase 12 11 - 51 U/L  Comprehensive metabolic panel  Result Value Ref Range   Sodium 137 135 - 145 mmol/L   Potassium 4.1 3.5 - 5.1 mmol/L   Chloride 100 98 - 111 mmol/L   CO2 24 22 - 32 mmol/L   Glucose, Bld 326 (H) 70 - 99 mg/dL   BUN 13 6 - 20 mg/dL   Creatinine, Ser 04/05/19 0.44 - 1.00 mg/dL   Calcium 9.7 8.9 - 8.75 mg/dL   Total Protein 8.1 6.5 - 8.1 g/dL   Albumin 4.0 3.5 - 5.0 g/dL   AST 64.3 (H) 15 - 41 U/L   ALT 378 (H) 0 - 44 U/L   Alkaline Phosphatase 254 (H) 38 - 126 U/L   Total Bilirubin 2.7 (H) 0.3 - 1.2 mg/dL   GFR calc non Af Amer >60 >60 mL/min   GFR calc Af Amer >60 >60 mL/min   Anion gap 13 5 - 15  CBC  Result Value Ref Range   WBC 8.7 4.0 - 10.5 K/uL   RBC 4.01 3.87 - 5.11 MIL/uL   Hemoglobin 12.4 12.0 - 15.0  g/dL   HCT 78.6 76.7 - 20.9 %   MCV 93.3 80.0 - 100.0 fL   MCH 30.9 26.0 - 34.0 pg   MCHC 33.2 30.0 - 36.0 g/dL   RDW 47.0 96.2 - 83.6 %   Platelets 329 150 - 400 K/uL   nRBC 0.0 0.0 - 0.2 %  Urinalysis, Routine w reflex microscopic  Result Value Ref Range   Color, Urine YELLOW YELLOW   APPearance CLEAR CLEAR   Specific Gravity, Urine 1.031 (H) 1.005 - 1.030   pH 8.0 5.0 - 8.0     Glucose, UA >=500 (A) NEGATIVE mg/dL   Hgb urine dipstick NEGATIVE NEGATIVE   Bilirubin Urine NEGATIVE NEGATIVE   Ketones, ur 80 (A) NEGATIVE mg/dL   Protein, ur 30 (A) NEGATIVE mg/dL   Nitrite NEGATIVE NEGATIVE   Leukocytes,Ua NEGATIVE NEGATIVE   RBC / HPF 0-5 0 - 5 RBC/hpf   WBC, UA 0-5 0 - 5 WBC/hpf   Bacteria, UA RARE (A) NONE SEEN   Squamous Epithelial / LPF 0-5 0 - 5  Rapid urine drug screen (hospital performed)  Result Value Ref Range   Opiates NONE DETECTED NONE DETECTED   Cocaine NONE DETECTED NONE DETECTED   Benzodiazepines NONE DETECTED NONE DETECTED   Amphetamines NONE DETECTED NONE DETECTED   Tetrahydrocannabinol NONE DETECTED NONE DETECTED   Barbiturates NONE DETECTED NONE DETECTED  Ethanol  Result Value Ref Range   Alcohol, Ethyl (B) <10 <10 mg/dL  Acetaminophen level  Result Value Ref Range   Acetaminophen (Tylenol), Serum <10 (L) 10 - 30 ug/mL  Salicylate level  Result Value Ref Range   Salicylate Lvl <7.0 (L) 7.0 - 30.0 mg/dL  I-Stat beta hCG blood, ED  Result Value Ref Range   I-stat hCG, quantitative <5.0 <5 mIU/mL   Comment 3          CBG monitoring, ED  Result Value Ref Range   Glucose-Capillary 295 (H) 70 - 99 mg/dL   US Abdomen Complete  Result Date: 04/03/2019 CLINICAL DATA:  Right upper quadrant pain EXAM: ABDOMEN ULTRASOUND COMPLETE COMPARISON:  CT 10/22/2017 FINDINGS: Gallbladder: Small mobile stones within the gallbladder, the largest 6 mm. No wall thickening or sonographic Murphy sign. Common bile duct: Diameter: Normal caliber, 6 mm. Liver: 2 cm echogenic nodule in the left lobe of the liver most compatible with hemangioma. Normal echotexture. Portal vein is patent on color Doppler imaging with normal direction of blood flow towards the liver. IVC: No abnormality visualized. Pancreas: Visualized portion unremarkable. Spleen: Size and appearance within normal limits. Right Kidney: Length: 12.1 cm. Echogenicity within normal limits. No mass or  hydronephrosis visualized. Left Kidney: Length: 11.7 cm. Echogenicity within normal limits. No mass or hydronephrosis visualized. Abdominal aorta: No aneurysm visualized. Other findings: None. IMPRESSION: Cholelithiasis.  No sonographic evidence of acute cholecystitis. 2 cm hyperechoic lesion within the left hepatic lobe most compatible with hemangioma. Electronically Signed   By: Charlett Nose M.D.   On: 04/03/2019 03:17    EKG EKG Interpretation  Date/Time:  Tuesday April 03 2019 01:16:58 EST Ventricular Rate:  106 PR Interval:    QRS Duration: 85 QT Interval:  364 QTC Calculation: 484 R Axis:   63 Text Interpretation: Sinus tachycardia Confirmed by Kacen Mellinger ([id number]) on 04/03/2019 1:22:19 AM   Radiology US Abdomen Complete  Result Date: 04/03/2019 CLINICAL DATA:  Right upper quadrant pain EXAM: ABDOMEN ULTRASOUND COMPLETE COMPARISON:  CT 10/22/2017 FINDINGS: Gallbladder: Small mobile stones within the gallbladder, the largest 6 mm.  No wall thickening or sonographic Murphy sign. Common bile duct: Diameter: Normal caliber, 6 mm. Liver: 2 cm echogenic nodule in the left lobe of the liver most compatible with hemangioma. Normal echotexture. Portal vein is patent on color Doppler imaging with normal direction of blood flow towards the liver. IVC: No abnormality visualized. Pancreas: Visualized portion unremarkable. Spleen: Size and appearance within normal limits. Right Kidney: Length: 12.1 cm. Echogenicity within normal limits. No mass or hydronephrosis visualized. Left Kidney: Length: 11.7 cm. Echogenicity within normal limits. No mass or hydronephrosis visualized. Abdominal aorta: No aneurysm visualized. Other findings: None. IMPRESSION: Cholelithiasis.  No sonographic evidence of acute cholecystitis. 2 cm hyperechoic lesion within the left hepatic lobe most compatible with hemangioma. Electronically Signed   By: Charlett Nose M.D.   On: 04/03/2019 03:17    Procedures Procedures (including  critical care time)  Medications Ordered in ED Results for orders placed or performed during the hospital encounter of 04/03/19  Lipase, blood  Result Value Ref Range   Lipase 12 11 - 51 U/L  Comprehensive metabolic panel  Result Value Ref Range   Sodium 137 135 - 145 mmol/L   Potassium 4.1 3.5 - 5.1 mmol/L   Chloride 100 98 - 111 mmol/L   CO2 24 22 - 32 mmol/L   Glucose, Bld 326 (H) 70 - 99 mg/dL   BUN 13 6 - 20 mg/dL   Creatinine, Ser 0.93 0.44 - 1.00 mg/dL   Calcium 9.7 8.9 - 81.8 mg/dL   Total Protein 8.1 6.5 - 8.1 g/dL   Albumin 4.0 3.5 - 5.0 g/dL   AST 299 (H) 15 - 41 U/L   ALT 378 (H) 0 - 44 U/L   Alkaline Phosphatase 254 (H) 38 - 126 U/L   Total Bilirubin 2.7 (H) 0.3 - 1.2 mg/dL   GFR calc non Af Amer >60 >60 mL/min   GFR calc Af Amer >60 >60 mL/min   Anion gap 13 5 - 15  CBC  Result Value Ref Range   WBC 8.7 4.0 - 10.5 K/uL   RBC 4.01 3.87 - 5.11 MIL/uL   Hemoglobin 12.4 12.0 - 15.0 g/dL   HCT 37.1 69.6 - 78.9 %   MCV 93.3 80.0 - 100.0 fL   MCH 30.9 26.0 - 34.0 pg   MCHC 33.2 30.0 - 36.0 g/dL   RDW 38.1 01.7 - 51.0 %   Platelets 329 150 - 400 K/uL   nRBC 0.0 0.0 - 0.2 %  Urinalysis, Routine w reflex microscopic  Result Value Ref Range   Color, Urine YELLOW YELLOW   APPearance CLEAR CLEAR   Specific Gravity, Urine 1.031 (H) 1.005 - 1.030   pH 8.0 5.0 - 8.0   Glucose, UA >=500 (A) NEGATIVE mg/dL   Hgb urine dipstick NEGATIVE NEGATIVE   Bilirubin Urine NEGATIVE NEGATIVE   Ketones, ur 80 (A) NEGATIVE mg/dL   Protein, ur 30 (A) NEGATIVE mg/dL   Nitrite NEGATIVE NEGATIVE   Leukocytes,Ua NEGATIVE NEGATIVE   RBC / HPF 0-5 0 - 5 RBC/hpf   WBC, UA 0-5 0 - 5 WBC/hpf   Bacteria, UA RARE (A) NONE SEEN   Squamous Epithelial / LPF 0-5 0 - 5  Rapid urine drug screen (hospital performed)  Result Value Ref Range   Opiates NONE DETECTED NONE DETECTED   Cocaine NONE DETECTED NONE DETECTED   Benzodiazepines NONE DETECTED NONE DETECTED   Amphetamines NONE DETECTED NONE  DETECTED   Tetrahydrocannabinol NONE DETECTED NONE DETECTED   Barbiturates NONE  DETECTED NONE DETECTED  Ethanol  Result Value Ref Range   Alcohol, Ethyl (B) <10 <10 mg/dL  Acetaminophen level  Result Value Ref Range   Acetaminophen (Tylenol), Serum <10 (L) 10 - 30 ug/mL  Salicylate level  Result Value Ref Range   Salicylate Lvl <2.9 (L) 7.0 - 30.0 mg/dL  I-Stat beta hCG blood, ED  Result Value Ref Range   I-stat hCG, quantitative <5.0 <5 mIU/mL   Comment 3          CBG monitoring, ED  Result Value Ref Range   Glucose-Capillary 295 (H) 70 - 99 mg/dL   US Abdomen Complete  Result Date: 04/03/2019 CLINICAL DATA:  Right upper quadrant pain EXAM: ABDOMEN ULTRASOUND COMPLETE COMPARISON:  CT 10/22/2017 FINDINGS: Gallbladder: Small mobile stones within the gallbladder, the largest 6 mm. No wall thickening or sonographic Murphy sign. Common bile duct: Diameter: Normal caliber, 6 mm. Liver: 2 cm echogenic nodule in the left lobe of the liver most compatible with hemangioma. Normal echotexture. Portal vein is patent on color Doppler imaging with normal direction of blood flow towards the liver. IVC: No abnormality visualized. Pancreas: Visualized portion unremarkable. Spleen: Size and appearance within normal limits. Right Kidney: Length: 12.1 cm. Echogenicity within normal limits. No mass or hydronephrosis visualized. Left Kidney: Length: 11.7 cm. Echogenicity within normal limits. No mass or hydronephrosis visualized. Abdominal aorta: No aneurysm visualized. Other findings: None. IMPRESSION: Cholelithiasis.  No sonographic evidence of acute cholecystitis. 2 cm hyperechoic lesion within the left hepatic lobe most compatible with hemangioma. Electronically Signed   By: Rolm Baptise M.D.   On: 04/03/2019 03:17    ED Course  I have reviewed the triage vital signs and the nursing notes.  Pertinent labs & imaging results that were available during my care of the patient were reviewed by me and  considered in my medical decision making (see chart for details).    Patient is not in DKA but is dehydrated secondary to emesis.  The patient had normal LFTs in August of 2019.  I asked specifically about tylenol usage and she states she took one ibuprofen/tylenol combination pill on Sunday.  She has never exceeded the package insert.    There are no signs of an acute surgical abdomen on exam or imaging.    Lakeyn Dokken was evaluated in Emergency Department on 04/03/2019 for the symptoms described in the history of present illness. She was evaluated in the context of the global COVID-19 pandemic, which necessitated consideration that the patient might be at risk for infection with the SARS-CoV-2 virus that causes COVID-19. Institutional protocols and algorithms that pertain to the evaluation of patients at risk for COVID-19 are in a state of rapid change based on information released by regulatory bodies including the CDC and federal and state organizations. These policies and algorithms were followed during the patient's care in the ED. ] Final Clinical Impression(s) / ED Diagnoses Final diagnoses:  Hepatitis  Dehydration   Will admit for dehydration and hepatitis.      Tag Wurtz, MD 04/03/19 [id number]

## 2019-04-03 NOTE — ED Notes (Signed)
Pt encouraged to provide urine specimen.  

## 2019-04-03 NOTE — ED Notes (Signed)
Pt attempting to provide urine specimen

## 2019-04-03 NOTE — Progress Notes (Addendum)
CRITICAL VALUE ALERT  Critical Value:  CBG 14  Date & Time Notied:  04/03/19 1544  Provider Notified: [id number]  Orders Received/Actions taken:   Patient complained of dizziness, weakness and appeared diaphoretic. CBG 14, Dextrose 50 given. Cbg 139. Provider contacted via page. Pt reports feeling some better, will continue to monitor.

## 2019-04-03 NOTE — H&P (Signed)
History and Physical    Theresa [name] GNO:[id number] DOB: 11-Aug-1990 DOA: 04/03/2019  PCP: Elenore Paddy, FNP  Patient coming from: Home  Chief Complaint: Nausea vomiting abdominal pain  HPI: Theresa [name] is a 29 y.o. female with medical history significant of fibromyalgia, nausea vomiting abdominal pain with type 1 diabetes comes in with illness for over a day.  Patient has had generalized abdominal pain with no fevers but associated nausea vomiting no diarrhea.  She is received treatment in the ED and has resolution of her symptoms.  However during her work-up her LFTs were noted to be mildly bumped with a total bili of 2.7.  She denies any use of aspirin or Tylenol products over-the-counter.  She denies any alcohol use.  Patient being referred for admission for acute hepatitis in the setting of some mild dehydration.  Review of Systems: As per HPI otherwise 10 point review of systems negative.   Past Medical History:  Diagnosis Date  . Fibromyalgia   . Headache   . Insomnia   . Insulin dependent type 1 diabetes mellitus ([place])   . 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  Recommend follow-up Pap smear/HPV at next annual exam.  . Peripheral neuropathy   . Seasonal allergies   . 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 CATARACT EXTRACTION W/ INTRAOCULAR LENS  IMPLANT, BILATERAL Bilateral 05/2014 - 06/2014  . 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 EYE SURGERY    . MASS EXCISION Right 07/10/2013   Procedure: MINOR EXCISION RIGHT NECK CYST;  Surgeon: Rozetta Nunnery, MD;  Location: [facility];  Service: ENT;  Laterality: Right;  . Nexplanon  06/27/2018  . PARS PLANA REPAIR OF RETINAL DEATACHMENT Right 11/04/2015  . PARS PLANA VITRECTOMY Right  11/04/2015   Procedure: PARS PLANA VITRECTOMY WITH 25 GAUGE TO REPAIR A COMPLEX TRACTION RETINAL DETACHMENT;  Surgeon: Hayden Pedro, MD;  Location: [facility];  Service: Ophthalmology;  Laterality: Right;  . WISDOM TOOTH EXTRACTION  09/2015     reports that she has never smoked. She has never used smokeless tobacco. She reports that she does not drink alcohol or use drugs.  Allergies  Allergen Reactions  . Aloe Hives  . Duloxetine Other (See Comments)    RESTLESSNESS URGES TO MOVE  . Gabapentin Swelling    SWELLING REACTION OF FEET  . Olive Oil Hives  . Lyrica [Pregabalin] Other (See Comments)    Heart palpitations Fainting  . Tapentadol Swelling  . Zonisamide Other (See Comments)    WORSENING PAIN    Family History  Problem Relation Age of Onset  . Diabetes Mother   . Migraines Mother   . Hypertension Mother   . CVA Mother   . Breast cancer Maternal Grandmother   . Thyroid disease Maternal Grandmother   . Cancer Maternal Grandfather     Prior to Admission medications   Medication Sig Start Date End Date Taking? Authorizing Provider  5-Hydroxytryptophan (5-HTP PO) Take 1 capsule by mouth daily.    Yes [provider]  ASHWAGANDHA PO Take 1 capsule by mouth daily.    Yes [provider]  B-COMPLEX-C PO Take 1 capsule by mouth daily.    Yes [provider]  buPROPion (WELLBUTRIN XL) 150 MG 24 hr tablet Take  three tablets every morning. 03/08/19  Yes Mozingo, Berdie Ogren, NP  cetirizine (ZYRTEC) 10 MG tablet Take 10 mg by mouth daily.   Yes [provider]  etonogestrel (NEXPLANON) 68 MG IMPL implant 1 each by Subdermal route once. March 2017   Yes [provider]  Ginkgo Biloba Extract 60 MG CAPS Take 1 capsule by mouth daily.    Yes [provider]  insulin aspart (NOVOLOG FLEXPEN) 100 UNIT/ML FlexPen Inject 5-20 Units into the skin 3 (three) times daily with meals. Sliding scale   Yes [provider]  Insulin  Degludec (TRESIBA FLEXTOUCH) 100 UNIT/ML SOPN Inject 55 Units into the skin daily.  12/14/14  Yes [provider]  ondansetron (ZOFRAN-ODT) 4 MG disintegrating tablet Take 4 mg by mouth every 8 (eight) hours as needed for nausea or vomiting.  10/24/17  Yes [provider]  oxyCODONE-acetaminophen (PERCOCET) 7.5-325 MG tablet Take 1 tablet by mouth every 6 (six) hours as needed for severe pain. for pain 10/20/17  Yes [provider]  Turmeric (RA TURMERIC) 500 MG CAPS Take 500 mg by mouth daily.    Yes [provider]  venlafaxine XR (EFFEXOR-XR) 75 MG 24 hr capsule Take 150 mg by mouth daily with breakfast.   Yes [provider]  Acetylcysteine 600 MG CAPS Take 1 capsule (600 mg total) by mouth 2 (two) times daily. Patient not taking: Reported on 04/03/2019 05/05/18   Donnal Moat T, PA-C  amphetamine-dextroamphetamine (ADDERALL) 20 MG tablet 1 q am, 1/2 q afternoon Patient not taking: Reported on 04/03/2019 11/22/18   Donnal Moat T, PA-C  amphetamine-dextroamphetamine (ADDERALL) 20 MG tablet 1 q am, 1/2 q afternoon. Patient not taking: Reported on 04/03/2019 12/21/18   Donnal Moat T, PA-C  amphetamine-dextroamphetamine (ADDERALL) 20 MG tablet 1 po q am, 1/2 at noon. Patient not taking: Reported on 04/03/2019 03/08/19   Mozingo, Berdie Ogren, NP  estradiol (ESTRACE) 1 MG tablet Take 1 tablet (1 mg total) by mouth daily. Patient not taking: Reported on 04/03/2019 10/19/18   Fontaine, Belinda Block, MD  famotidine (PEPCID) 20 MG tablet Take 1 tablet (20 mg total) by mouth 2 (two) times daily. 10/26/17 10/26/18  Doreatha Lew, MD    Physical Exam: Vitals:   04/03/19 0235 04/03/19 0300 04/03/19 0330 04/03/19 0430  BP: (!) 147/95 123/68 131/76 (!) 149/95  Pulse: (!) 105 (!) 105 (!) 109 (!) 111  Resp: 20 18 (!) 21 20  Temp:      TempSrc:      SpO2: 100% 99% 100% 100%  Weight:      Height:          Constitutional: NAD, calm, comfortable Vitals:    04/03/19 0235 04/03/19 0300 04/03/19 0330 04/03/19 0430  BP: (!) 147/95 123/68 131/76 (!) 149/95  Pulse: (!) 105 (!) 105 (!) 109 (!) 111  Resp: 20 18 (!) 21 20  Temp:      TempSrc:      SpO2: 100% 99% 100% 100%  Weight:      Height:       Eyes: PERRL, lids and conjunctivae normal ENMT: Mucous membranes are moist. Posterior pharynx clear of any exudate or lesions.Normal dentition.  Neck: normal, supple, no masses, no thyromegaly Respiratory: clear to auscultation bilaterally, no wheezing, no crackles. Normal respiratory effort. No accessory muscle use.  Cardiovascular: Regular rate and rhythm, no murmurs / rubs / gallops. No extremity edema. 2+ pedal pulses. No carotid bruits.  Abdomen: no tenderness, no masses  palpated. No hepatosplenomegaly. Bowel sounds positive.  Musculoskeletal: no clubbing / cyanosis. No joint deformity upper and lower extremities. Good ROM, no contractures. Normal muscle tone.  Skin: no rashes, lesions, ulcers. No induration Neurologic: CN 2-12 grossly intact. Sensation intact, DTR normal. Strength 5/5 in all 4.  Psychiatric: Normal judgment and insight. Alert and oriented x 3. Normal mood.    Labs on Admission: I have personally reviewed following labs and imaging studies  CBC: Recent Labs  Lab 04/03/19 0050  WBC 8.7  HGB 12.4  HCT 37.4  MCV 93.3  PLT 833   Basic Metabolic Panel: Recent Labs  Lab 04/03/19 0050  NA 137  K 4.1  CL 100  CO2 24  GLUCOSE 326*  BUN 13  CREATININE 0.67  CALCIUM 9.7   GFR: Estimated Creatinine Clearance: 115 mL/min (by C-G formula based on SCr of 0.67 mg/dL). Liver Function Tests: Recent Labs  Lab 04/03/19 0050  AST 994*  ALT 378*  ALKPHOS 254*  BILITOT 2.7*  PROT 8.1  ALBUMIN 4.0   Recent Labs  Lab 04/03/19 0050  LIPASE 12   No results for input(s): AMMONIA in the last 168 hours. Coagulation Profile: No results for input(s): INR, PROTIME in the last 168 hours. Cardiac Enzymes: No results for  input(s): CKTOTAL, CKMB, CKMBINDEX, TROPONINI in the last 168 hours. BNP (last 3 results) No results for input(s): PROBNP in the last 8760 hours. HbA1C: No results for input(s): HGBA1C in the last 72 hours. CBG: Recent Labs  Lab 04/03/19 0032  GLUCAP 295*   Lipid Profile: No results for input(s): CHOL, HDL, LDLCALC, TRIG, CHOLHDL, LDLDIRECT in the last 72 hours. Thyroid Function Tests: No results for input(s): TSH, T4TOTAL, FREET4, T3FREE, THYROIDAB in the last 72 hours. Anemia Panel: No results for input(s): VITAMINB12, FOLATE, FERRITIN, TIBC, IRON, RETICCTPCT in the last 72 hours. Urine analysis:    Component Value Date/Time   COLORURINE YELLOW 04/03/2019 0156   APPEARANCEUR CLEAR 04/03/2019 0156   LABSPEC 1.031 (H) 04/03/2019 0156   PHURINE 8.0 04/03/2019 0156   GLUCOSEU >=500 (A) 04/03/2019 0156   HGBUR NEGATIVE 04/03/2019 0156   BILIRUBINUR NEGATIVE 04/03/2019 0156   BILIRUBINUR neg 07/19/2013 1519   KETONESUR 80 (A) 04/03/2019 0156   PROTEINUR 30 (A) 04/03/2019 0156   UROBILINOGEN 0.2 02/25/2014 0524   NITRITE NEGATIVE 04/03/2019 0156   LEUKOCYTESUR NEGATIVE 04/03/2019 0156   Sepsis Labs: !!!!!!!!!!!!!!!!!!!!!!!!!!!!!!!!!!!!!!!!!!!! _0 (procalcitonin:4,lacticidven:4) )No results found for this or any previous visit (from the past 240 hour(s)).   Radiological Exams on Admission: US Abdomen Complete  Result Date: 04/03/2019 CLINICAL DATA:  Right upper quadrant pain EXAM: ABDOMEN ULTRASOUND COMPLETE COMPARISON:  CT 10/22/2017 FINDINGS: Gallbladder: Small mobile stones within the gallbladder, the largest 6 mm. No wall thickening or sonographic Murphy sign. Common bile duct: Diameter: Normal caliber, 6 mm. Liver: 2 cm echogenic nodule in the left lobe of the liver most compatible with hemangioma. Normal echotexture. Portal vein is patent on color Doppler imaging with normal direction of blood flow towards the liver. IVC: No abnormality visualized. Pancreas: Visualized  portion unremarkable. Spleen: Size and appearance within normal limits. Right Kidney: Length: 12.1 cm. Echogenicity within normal limits. No mass or hydronephrosis visualized. Left Kidney: Length: 11.7 cm. Echogenicity within normal limits. No mass or hydronephrosis visualized. Abdominal aorta: No aneurysm visualized. Other findings: None. IMPRESSION: Cholelithiasis.  No sonographic evidence of acute cholecystitis. 2 cm hyperechoic lesion within the left hepatic lobe most compatible with hemangioma. Electronically Signed   By: Rolm Baptise  M.D.   On: 04/03/2019 03:17   DG Chest Portable 1 View  Result Date: 04/03/2019 CLINICAL DATA:  Abdominal and chest pain EXAM: PORTABLE CHEST 1 VIEW COMPARISON:  None. FINDINGS: Normal heart size and mediastinal contours. Low volume chest without acute infiltrate or edema. No effusion or pneumothorax. No acute osseous findings. IMPRESSION: No active disease. Electronically Signed   By: Monte Fantasia M.D.   On: 04/03/2019 04:32   Old chart reviewed Case cussed with EDP Chest x-ray reviewed no edema or infiltrate  Assessment/Plan 29 year old type I diabetic female comes in with abdominal pain generalized with nausea and vomiting with elevated LFTs Principal Problem:   Intractable nausea and vomiting -patient with generalized abdominal pain nothing focal.  She does have elevated T bili with elevated alk phos.  Query whether or not she passed a gallstone.  Check MRCP.  Symptoms seem better since treatment in the ED.  Covid screen is pending.  Repeat LFTs in the morning.  Acute hepatitis panel is also pending.  Abdominal exam is benign.  Active Problems:   Type 1 diabetes mellitus (H[facility])-continue her home insulin regimen.  Not acidotic at this time.    Chronic pain-continue home meds urine drug screen is negative.  Query whether or not component of withdrawal present    Anxiety-noted    Abdominal pain, acute, generalized-as above     DVT prophylaxis:  SCDs Code Status: Full Family Communication: None Disposition Plan: 1 to 3 days Consults called: None Admission status: Observation   Krystle Oberman A MD [facility]  If 7PM-7AM, please contact night-coverage [email or web address] Password [facility]  04/03/2019, 4:42 AM

## 2019-04-04 DIAGNOSIS — R1084 Generalized abdominal pain: Secondary | ICD-10-CM

## 2019-04-04 DIAGNOSIS — R112 Nausea with vomiting, unspecified: Secondary | ICD-10-CM | POA: Diagnosis not present

## 2019-04-04 LAB — CBC
HCT: 31.5 % — ABNORMAL LOW (ref 36.0–46.0)
Hemoglobin: 10 g/dL — ABNORMAL LOW (ref 12.0–15.0)
MCH: 30.7 pg (ref 26.0–34.0)
MCHC: 31.7 g/dL (ref 30.0–36.0)
MCV: 96.6 fL (ref 80.0–100.0)
Platelets: 292 10*3/uL (ref 150–400)
RBC: 3.26 MIL/uL — ABNORMAL LOW (ref 3.87–5.11)
RDW: 13.3 % (ref 11.5–15.5)
WBC: 6.5 10*3/uL (ref 4.0–10.5)
nRBC: 0 % (ref 0.0–0.2)

## 2019-04-04 LAB — GLUCOSE, CAPILLARY
Glucose-Capillary: 83 mg/dL (ref 70–99)
Glucose-Capillary: 269 mg/dL — ABNORMAL HIGH (ref 70–99)
Glucose-Capillary: 261 mg/dL — ABNORMAL HIGH (ref 70–99)

## 2019-04-04 LAB — COMPREHENSIVE METABOLIC PANEL
ALT: 380 U/L — ABNORMAL HIGH (ref 0–44)
AST: 406 U/L — ABNORMAL HIGH (ref 15–41)
Albumin: 2.9 g/dL — ABNORMAL LOW (ref 3.5–5.0)
Alkaline Phosphatase: 239 U/L — ABNORMAL HIGH (ref 38–126)
Anion gap: 7 (ref 5–15)
BUN: 10 mg/dL (ref 6–20)
CO2: 23 mmol/L (ref 22–32)
Calcium: 8.3 mg/dL — ABNORMAL LOW (ref 8.9–10.3)
Chloride: 108 mmol/L (ref 98–111)
Creatinine, Ser: 0.62 mg/dL (ref 0.44–1.00)
GFR calc Af Amer: >60 mL/min (ref >60)
GFR calc non Af Amer: >60 mL/min (ref >60)
Glucose, Bld: 148 mg/dL — ABNORMAL HIGH (ref 70–99)
Potassium: 4 mmol/L (ref 3.5–5.1)
Sodium: 138 mmol/L (ref 135–145)
Total Bilirubin: 1 mg/dL (ref 0.3–1.2)
Total Protein: 5.9 g/dL — ABNORMAL LOW (ref 6.5–8.1)

## 2019-04-04 NOTE — Progress Notes (Signed)
Patient tolerated lunch well. Denies N/V, pain at this time. Will continue to monitor.

## 2019-04-04 NOTE — Progress Notes (Signed)
Called Cottie Banda, aunt, with updates and discharge plan.

## 2019-04-04 NOTE — Discharge Summary (Signed)
Physician Discharge Summary  Theresa [name] RJJ:[id number] DOB: [birth year] DOA: 04/03/2019  PCP: Elenore Paddy, FNP  Admit date: 04/03/2019 Discharge date: 04/04/2019  Admitted From: Home Disposition:  Home  Recommendations for Outpatient Follow-up:  1. Follow up with PCP in 1-2 weeks 2. Follow up with primary GI doctor in 1-2 weeks 3. Recommend repeat CMP in 1 week, focus on LFT's  Discharge Condition:Improved CODE STATUS:Full Diet recommendation: Soft, advance to diabetic   Brief/Interim Summary: 29 year old female with medical history significant for fibromyalgia, type I DM, presents with generalized abdominal pain, nausea/vomiting.  In the ED noted to have elevated LFTs, right upper quadrant showed gallstones, with stone in the neck of the gallbladder but normal CBD.  MRCP was largely unremarkable, with no CBD stone or cholecystitis.  Acute viral hepatitis panel nonreactive.  General surgery consulted, no further recommendation.  Discharge Diagnoses:  Principal Problem:   Intractable nausea and vomiting Active Problems:   Type 1 diabetes mellitus (HCC)   Chronic pain   Anxiety   Abdominal pain, acute, generalized  Principal Problem:   Intractable nausea and vomiting  -patient with generalized abdominal pain nothing focal.   -Pt noted to have elevated LFT's -Imaging confirmed presence of gallstones, however without evidence of biliary obstruction -General Surgery was consulted. Stones were felt to be incidental and General Surgery felt skeptical the gallbladder is source of her problem. -LFT's were trended and trended down -Pt's symptoms resolved and pt was advanced to soft diet without issues -Recommend repeat LFT's in one week and follow up with pt's primary gastroenterologist  Active Problems:   Type 1 diabetes mellitus (HCC)-continue her home insulin regimen    Chronic pain-continue home meds urine drug screen is negative.    Anxiety-noted    Abdominal pain,  acute, generalized-as above   Discharge Instructions   Allergies as of 04/04/2019      Reactions   Aloe Hives   Duloxetine Other (See Comments)   RESTLESSNESS URGES TO MOVE   Gabapentin Swelling   SWELLING REACTION OF FEET   Olive Oil Hives   Lyrica [pregabalin] Other (See Comments)   Heart palpitations Fainting   Tapentadol Swelling   Zonisamide Other (See Comments)   WORSENING PAIN      Medication List    STOP taking these medications   Acetylcysteine 600 MG Caps   estradiol 1 MG tablet Commonly known as: ESTRACE     TAKE these medications   5-HTP PO Take 1 capsule by mouth daily.   amphetamine-dextroamphetamine 20 MG tablet Commonly known as: Adderall 1 po q am, 1/2 at noon. What changed: Another medication with the same name was removed. Continue taking this medication, and follow the directions you see here.   ASHWAGANDHA PO Take 1 capsule by mouth daily.   B-COMPLEX-C PO Take 1 capsule by mouth daily.   buPROPion 150 MG 24 hr tablet Commonly known as: WELLBUTRIN XL TAKE THREE TABLETS EVERY MORNING.   cetirizine 10 MG tablet Commonly known as: ZYRTEC Take 10 mg by mouth daily.   famotidine 20 MG tablet Commonly known as: PEPCID Take 1 tablet (20 mg total) by mouth 2 (two) times daily.   Ginkgo Biloba Extract 60 MG Caps Take 1 capsule by mouth daily.   Nexplanon 68 MG Impl implant Generic drug: etonogestrel 1 each by Subdermal route once. March 2017   NovoLOG FlexPen 100 UNIT/ML FlexPen Generic drug: insulin aspart Inject 5-20 Units into the skin 3 (three) times daily with meals. Sliding scale  ondansetron 4 MG disintegrating tablet Commonly known as: ZOFRAN-ODT Take 4 mg by mouth every 8 (eight) hours as needed for nausea or vomiting.   oxyCODONE-acetaminophen 7.5-325 MG tablet Commonly known as: PERCOCET Take 1 tablet by mouth every 6 (six) hours as needed for severe pain. for pain   RA Turmeric 500 MG Caps Generic drug:  Turmeric Take 500 mg by mouth daily.   Evaristo Bury FlexTouch 100 UNIT/ML Sopn FlexTouch Pen Generic drug: insulin degludec Inject 55 Units into the skin daily.   venlafaxine XR 75 MG 24 hr capsule Commonly known as: EFFEXOR-XR Take 150 mg by mouth daily with breakfast.      Follow-up Information    Virl Axe, FNP. Schedule an appointment as soon as possible for a visit in 2 week(s).   Specialty: Family Medicine Contact information: [address] [place] [address] [phone]        Follow up with your GI doctor. Schedule an appointment as soon as possible for a visit in 1 week(s).          Allergies  Allergen Reactions  . Aloe Hives  . Duloxetine Other (See Comments)    RESTLESSNESS URGES TO MOVE  . Gabapentin Swelling    SWELLING REACTION OF FEET  . Olive Oil Hives  . Lyrica [Pregabalin] Other (See Comments)    Heart palpitations Fainting  . Tapentadol Swelling  . Zonisamide Other (See Comments)    WORSENING PAIN    Consultations:  General Surgery  Procedures/Studies: US Abdomen Complete  Result Date: 04/03/2019 CLINICAL DATA:  Right upper quadrant pain EXAM: ABDOMEN ULTRASOUND COMPLETE COMPARISON:  CT 10/22/2017 FINDINGS: Gallbladder: Small mobile stones within the gallbladder, the largest 6 mm. No wall thickening or sonographic Murphy sign. Common bile duct: Diameter: Normal caliber, 6 mm. Liver: 2 cm echogenic nodule in the left lobe of the liver most compatible with hemangioma. Normal echotexture. Portal vein is patent on color Doppler imaging with normal direction of blood flow towards the liver. IVC: No abnormality visualized. Pancreas: Visualized portion unremarkable. Spleen: Size and appearance within normal limits. Right Kidney: Length: 12.1 cm. Echogenicity within normal limits. No mass or hydronephrosis visualized. Left Kidney: Length: 11.7 cm. Echogenicity within normal limits. No mass or hydronephrosis visualized. Abdominal aorta: No  aneurysm visualized. Other findings: None. IMPRESSION: Cholelithiasis.  No sonographic evidence of acute cholecystitis. 2 cm hyperechoic lesion within the left hepatic lobe most compatible with hemangioma. Electronically Signed   By: Charlett Nose M.D.   On: 04/03/2019 03:17   MR 3D Recon At Scanner  Result Date: 04/03/2019 CLINICAL DATA:  Abdominal pain, cholelithiasis. Probable hemangioma within LEFT hepatic lobe on comparison ultrasound. EXAM: MRI ABDOMEN WITHOUT AND WITH CONTRAST (INCLUDING MRCP) TECHNIQUE: Multiplanar multisequence MR imaging of the abdomen was performed both before and after the administration of intravenous contrast. Heavily T2-weighted images of the biliary and pancreatic ducts were obtained, and three-dimensional MRCP images were rendered by post processing. CONTRAST:  40mL GADAVIST GADOBUTROL 1 MMOL/ML IV SOLN COMPARISON:  Ultrasound 04/03/2019 FINDINGS: Lower chest:  Lung bases are clear. Hepatobiliary: Multiple small gallstones within lumen of the gallbladder measure approximately 4-5 mm each. There is no gallbladder distension or pericholecystic fluid. No gallbladder wall thickening. One stone is towards the neck of the gall bladder (image 28/3). The common bile duct is normal caliber. No filling defect within the common bile duct. Normal hepatic parenchymal intensity. No discrete lesion identified to correspond to the ultrasound findings. Enhancing lesion present. Pancreas: Normal pancreatic parenchymal intensity. No  ductal dilatation or inflammation. Spleen: Normal spleen. Adrenals/urinary tract: Adrenal glands and kidneys are normal. Stomach/Bowel: Stomach and limited of the small bowel is unremarkable Vascular/Lymphatic: Abdominal aortic normal caliber. No retroperitoneal periportal lymphadenopathy. Musculoskeletal: No aggressive osseous lesion IMPRESSION: 1. Cholelithiasis without evidence of cholecystitis. One gallstone is towards the neck of the gallbladder. 2. Normal common  bile duct.  No dilatation or choledocholithiasis. 3. Normal pancreas. 4. Hepatic lesion described on comparison ultrasound is not identified on MRI Electronically Signed   By: Genevive Bi M.D.   On: 04/03/2019 08:07   DG Chest Portable 1 View  Result Date: 04/03/2019 CLINICAL DATA:  Abdominal and chest pain EXAM: PORTABLE CHEST 1 VIEW COMPARISON:  None. FINDINGS: Normal heart size and mediastinal contours. Low volume chest without acute infiltrate or edema. No effusion or pneumothorax. No acute osseous findings. IMPRESSION: No active disease. Electronically Signed   By: Marnee Spring M.D.   On: 04/03/2019 04:32   MR ABDOMEN MRCP W WO CONTAST  Result Date: 04/03/2019 CLINICAL DATA:  Abdominal pain, cholelithiasis. Probable hemangioma within LEFT hepatic lobe on comparison ultrasound. EXAM: MRI ABDOMEN WITHOUT AND WITH CONTRAST (INCLUDING MRCP) TECHNIQUE: Multiplanar multisequence MR imaging of the abdomen was performed both before and after the administration of intravenous contrast. Heavily T2-weighted images of the biliary and pancreatic ducts were obtained, and three-dimensional MRCP images were rendered by post processing. CONTRAST:  70mL GADAVIST GADOBUTROL 1 MMOL/ML IV SOLN COMPARISON:  Ultrasound 04/03/2019 FINDINGS: Lower chest:  Lung bases are clear. Hepatobiliary: Multiple small gallstones within lumen of the gallbladder measure approximately 4-5 mm each. There is no gallbladder distension or pericholecystic fluid. No gallbladder wall thickening. One stone is towards the neck of the gall bladder (image 28/3). The common bile duct is normal caliber. No filling defect within the common bile duct. Normal hepatic parenchymal intensity. No discrete lesion identified to correspond to the ultrasound findings. Enhancing lesion present. Pancreas: Normal pancreatic parenchymal intensity. No ductal dilatation or inflammation. Spleen: Normal spleen. Adrenals/urinary tract: Adrenal glands and kidneys are  normal. Stomach/Bowel: Stomach and limited of the small bowel is unremarkable Vascular/Lymphatic: Abdominal aortic normal caliber. No retroperitoneal periportal lymphadenopathy. Musculoskeletal: No aggressive osseous lesion IMPRESSION: 1. Cholelithiasis without evidence of cholecystitis. One gallstone is towards the neck of the gallbladder. 2. Normal common bile duct.  No dilatation or choledocholithiasis. 3. Normal pancreas. 4. Hepatic lesion described on comparison ultrasound is not identified on MRI Electronically Signed   By: Genevive Bi M.D.   On: 04/03/2019 08:07     Subjective: Feels better today. No more N/V/abd pain  Discharge Exam: Vitals:   04/03/19 2059 04/04/19 0531  BP: 120/82 116/79  Pulse: (!) 105 94  Resp: 16 18  Temp: 98.9 F (37.2 C) 98.5 F (36.9 C)  SpO2: 100% 97%   Vitals:   04/03/19 1447 04/03/19 1528 04/03/19 2059 04/04/19 0531  BP: 110/78 122/67 120/82 116/79  Pulse: (!) 104 (!) 114 (!) 105 94  Resp: 20 (!) 22 16 18   Temp: 98 F (36.7 C)  98.9 F (37.2 C) 98.5 F (36.9 C)  TempSrc: Oral  Oral Oral  SpO2: 97% 100% 100% 97%  Weight:    91.7 kg  Height:        General: Pt is alert, awake, not in acute distress Cardiovascular: RRR, S1/S2 +, no rubs, no gallops Respiratory: CTA bilaterally, no wheezing, no rhonchi Abdominal: Soft, NT, ND, bowel sounds + Extremities: no edema, no cyanosis   The results of significant diagnostics from  this hospitalization (including imaging, microbiology, ancillary and laboratory) are listed below for reference.     Microbiology: Recent Results (from the past 240 hour(s))  SARS CORONAVIRUS 2 (TAT 6-24 HRS) Nasopharyngeal Nasopharyngeal Swab     Status: None   Collection Time: 04/03/19  4:23 AM   Specimen: Nasopharyngeal Swab  Result Value Ref Range Status   SARS Coronavirus 2 NEGATIVE NEGATIVE Final    Comment: (NOTE) SARS-CoV-2 target nucleic acids are NOT DETECTED. The SARS-CoV-2 RNA is generally detectable  in upper and lower respiratory specimens during the acute phase of infection. Negative results do not preclude SARS-CoV-2 infection, do not rule out co-infections with other pathogens, and should not be used as the sole basis for treatment or other patient management decisions. Negative results must be combined with clinical observations, patient history, and epidemiological information. The expected result is Negative. Fact Sheet for Patients: HairSlick.no Fact Sheet for Healthcare Providers: quierodirigir.com This test is not yet approved or cleared by the [place] FDA and  has been authorized for detection and/or diagnosis of SARS-CoV-2 by FDA under an Emergency Use Authorization (EUA). This EUA will remain  in effect (meaning this test can be used) for the duration of the COVID-19 declaration under Section 56 4(b)(1) of the Act, 21 U.S.C. section 360bbb-3(b)(1), unless the authorization is terminated or revoked sooner. Performed at [facility], [address], [place], [place] [address]      Labs: BNP (last 3 results) No results for input(s): BNP in the last 8760 hours. Basic Metabolic Panel: Recent Labs  Lab 04/03/19 0050 04/03/19 0500 04/04/19 0252  NA 137 137 138  K 4.1 4.3 4.0  CL 100 102 108  CO2 24 23 23   GLUCOSE 326* 377* 148*  BUN 13 15 10   CREATININE 0.67 0.81 0.62  CALCIUM 9.7 8.9 8.3*   Liver Function Tests: Recent Labs  Lab 04/03/19 0050 04/03/19 0500 04/04/19 0252  AST 994* 889* 406*  ALT 378* 432* 380*  ALKPHOS 254* 257* 239*  BILITOT 2.7* 2.6* 1.0  PROT 8.1 7.3 5.9*  ALBUMIN 4.0 3.8 2.9*   Recent Labs  Lab 04/03/19 0050  LIPASE 12   No results for input(s): AMMONIA in the last 168 hours. CBC: Recent Labs  Lab 04/03/19 0050 04/03/19 0500 04/04/19 0252  WBC 8.7 7.3 6.5  HGB 12.4 11.1* 10.0*  HCT 37.4 34.7* 31.5*  MCV 93.3 96.1 96.6  PLT 329 327 292   Cardiac  Enzymes: No results for input(s): CKTOTAL, CKMB, CKMBINDEX, TROPONINI in the last 168 hours. BNP: Invalid input(s): POCBNP CBG: Recent Labs  Lab 04/03/19 1536 04/03/19 1648 04/03/19 2057 04/04/19 0732 04/04/19 1226  GLUCAP 139* 131* 147* 83 269*   D-Dimer No results for input(s): DDIMER in the last 72 hours. Hgb A1c No results for input(s): HGBA1C in the last 72 hours. Lipid Profile No results for input(s): CHOL, HDL, LDLCALC, TRIG, CHOLHDL, LDLDIRECT in the last 72 hours. Thyroid function studies No results for input(s): TSH, T4TOTAL, T3FREE, THYROIDAB in the last 72 hours.  Invalid input(s): FREET3 Anemia work up No results for input(s): VITAMINB12, FOLATE, FERRITIN, TIBC, IRON, RETICCTPCT in the last 72 hours. Urinalysis    Component Value Date/Time   COLORURINE YELLOW 04/03/2019 0156   APPEARANCEUR CLEAR 04/03/2019 0156   LABSPEC 1.031 (H) 04/03/2019 0156   PHURINE 8.0 04/03/2019 0156   GLUCOSEU >=500 (A) 04/03/2019 0156   HGBUR NEGATIVE 04/03/2019 0156   BILIRUBINUR NEGATIVE 04/03/2019 0156   BILIRUBINUR neg 07/19/2013  1519   KETONESUR 80 (A) 04/03/2019 0156   PROTEINUR 30 (A) 04/03/2019 0156   UROBILINOGEN 0.2 02/25/2014 0524   NITRITE NEGATIVE 04/03/2019 0156   LEUKOCYTESUR NEGATIVE 04/03/2019 0156   Sepsis Labs Invalid input(s): PROCALCITONIN,  WBC,  LACTICIDVEN Microbiology Recent Results (from the past 240 hour(s))  SARS CORONAVIRUS 2 (TAT 6-24 HRS) Nasopharyngeal Nasopharyngeal Swab     Status: None   Collection Time: 04/03/19  4:23 AM   Specimen: Nasopharyngeal Swab  Result Value Ref Range Status   SARS Coronavirus 2 NEGATIVE NEGATIVE Final    Comment: (NOTE) SARS-CoV-2 target nucleic acids are NOT DETECTED. The SARS-CoV-2 RNA is generally detectable in upper and lower respiratory specimens during the acute phase of infection. Negative results do not preclude SARS-CoV-2 infection, do not rule out co-infections with other pathogens, and should not  be used as the sole basis for treatment or other patient management decisions. Negative results must be combined with clinical observations, patient history, and epidemiological information. The expected result is Negative. Fact Sheet for Patients: HairSlick.nohttps://www.fda.gov/media/138098/download Fact Sheet for Healthcare Providers: quierodirigir.comhttps://www.fda.gov/media/138095/download This test is not yet approved or cleared by the [place] FDA and  has been authorized for detection and/or diagnosis of SARS-CoV-2 by FDA under an Emergency Use Authorization (EUA). This EUA will remain  in effect (meaning this test can be used) for the duration of the COVID-19 declaration under Section 56 4(b)(1) of the Act, 21 U.S.C. section 360bbb-3(b)(1), unless the authorization is terminated or revoked sooner. Performed at [facility], [address] [place], [place] [address]    Time spent: 30 min  SIGNED:   Rickey BarbaraStephen Kamauri Kathol, MD  [facility] 04/04/2019, 4:06 PM  If 7PM-7AM, please contact night-coverage

## 2019-04-04 NOTE — Progress Notes (Signed)
Inpatient Diabetes Program Recommendations  AACE/ADA: New Consensus Statement on Inpatient Glycemic Control (2015)  Target Ranges:  Prepandial:   less than 140 mg/dL      Peak postprandial:   less than 180 mg/dL (1-2 hours)      Critically ill patients:  140 - 180 mg/dL   Lab Results  Component Value Date   GLUCAP 83 04/04/2019   HGBA1C 15.4 (H) 07/26/2013    Review of Glycemic Control  Diabetes history: DM 1 Outpatient Diabetes medications: Tresiba 55 units, Novolog 5-20 units (correction and meal coverage in one scale) Current orders for Inpatient glycemic control:  Lantus 55 units Novolog 0-20 units tid  Inpatient Diabetes Program Recommendations:    Noted hypoglycemia yesterday due to amount of Novolog given (15 units + 23 units given within 2 hours of each other  -  Reduce Correction scale to 0-9 units tid -  Add Novolog hs scale -  Add Novolog 3 units tid meal coverage with parameters when eating. (give if pt eats at least 50% of meals and glucose is at least 80 mg/dl).  Thanks,  Christena Deem RN, MSN, BC-ADM Inpatient Diabetes Coordinator Team Pager [phone] (8a-5p)

## 2019-04-05 ENCOUNTER — Ambulatory Visit: Payer: BC Managed Care – PPO | Admitting: Adult Health

## 2019-04-10 ENCOUNTER — Ambulatory Visit (INDEPENDENT_AMBULATORY_CARE_PROVIDER_SITE_OTHER): Payer: BC Managed Care – PPO | Admitting: Adult Health

## 2019-04-10 ENCOUNTER — Other Ambulatory Visit: Payer: Self-pay

## 2019-04-10 ENCOUNTER — Encounter: Payer: Self-pay | Admitting: Adult Health

## 2019-04-10 DIAGNOSIS — IMO0002 Reserved for concepts with insufficient information to code with codable children: Secondary | ICD-10-CM | POA: Insufficient documentation

## 2019-04-10 DIAGNOSIS — F411 Generalized anxiety disorder: Secondary | ICD-10-CM

## 2019-04-10 DIAGNOSIS — J309 Allergic rhinitis, unspecified: Secondary | ICD-10-CM | POA: Insufficient documentation

## 2019-04-10 DIAGNOSIS — F331 Major depressive disorder, recurrent, moderate: Secondary | ICD-10-CM | POA: Diagnosis not present

## 2019-04-10 DIAGNOSIS — F908 Attention-deficit hyperactivity disorder, other type: Secondary | ICD-10-CM

## 2019-04-10 DIAGNOSIS — K5900 Constipation, unspecified: Secondary | ICD-10-CM | POA: Insufficient documentation

## 2019-04-10 DIAGNOSIS — E1142 Type 2 diabetes mellitus with diabetic polyneuropathy: Secondary | ICD-10-CM | POA: Insufficient documentation

## 2019-04-10 DIAGNOSIS — E1165 Type 2 diabetes mellitus with hyperglycemia: Secondary | ICD-10-CM | POA: Insufficient documentation

## 2019-04-10 DIAGNOSIS — G4700 Insomnia, unspecified: Secondary | ICD-10-CM

## 2019-04-10 MED ORDER — AMPHETAMINE-DEXTROAMPHETAMINE 20 MG PO TABS: ORAL_TABLET | ORAL | 0 refills | Status: DC

## 2019-04-10 NOTE — Progress Notes (Signed)
Theresa [name] [id number] [birth year] 29 y.o.  Subjective:   Patient ID:  Theresa [name] is a 29 y.o. (DOB 13-Feb-1991) female.  Chief Complaint:  Chief Complaint  Patient presents with  . Anxiety  . Depression  . Insomnia  . ADHD    HPI   Theresa [name] presents to the office today for follow-up of anxiety, depression, ADHD, and insomnia.   Describes mood today as "ok". Pleasant. Flat. Mood symptoms - reports decreased depression, anxiety, and irritability. Stating "things are better". Can tell a "improvement" with increase in Wellbutrin. Has more interest and motivation to get things done. Had a fall recently - fell down a few steps. Had to go to the hospital. Has a CT scan of head scheduled for tomorrow. Having Fibromyalgia "flare-ups". Visiting with Aunt. Stable interest and motivation. Taking other medications as prescribed.  Energy levels improved. Active, has a regular exercise routine. 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 Enjoys some usual interests and activities. Lives alone with dog - "[name]". Spending time with family. Appetite decreased. Weight stable. Sleeping better at night. Averages 5 to 8 hours. Focus and concentration stable with Adderall. Completing tasks. Managing aspects of household. Business "slow".  Denies SI or HI. Denies AH or VH.  Past medications for mental health diagnoses include: Elavil, Cymbalta, gabapentin caused swelling in her lower extremities, Horizant, Topamax, baclofen, Lunesta, Belsomra was effective, trazodone caused increased glucose sonar caused increased glucose, Modafinildid not help.  Allergies: Aloe, Duloxetine, Gabapentin, Olive oil, Lyrica [pregabalin], Tapentadol, and Zonisamide    PHQ2-9     N[facility]24[facility] Nutrition from 08/24/2016 in [facility] Nutrition from 07/20/2016 in [facility] Nutrition from  01/15/2014 in [facility]  PHQ-2 Total Score  0  0  0  0       Review of Systems:  Review of Systems  Musculoskeletal: Negative for gait problem.  Neurological: Negative for tremors.  Psychiatric/Behavioral:       Please refer to HPI    Medications: I have reviewed the patient's current medications.  Current Outpatient Medications  Medication Sig Dispense Refill  . 5-Hydroxytryptophan (5-HTP PO) Take 1 capsule by mouth daily.     Marland Kitchen amphetamine-dextroamphetamine (ADDERALL) 20 MG tablet 1 po q am, 1/2 at noon. 45 tablet 0  . ASHWAGANDHA PO Take 1 capsule by mouth daily.     . B-COMPLEX-C PO Take 1 capsule by mouth daily.     Marland Kitchen buPROPion (WELLBUTRIN XL) 150 MG 24 hr tablet TAKE THREE TABLETS EVERY MORNING. 270 tablet 0  . cetirizine (ZYRTEC) 10 MG tablet Take 10 mg by mouth daily.    Marland Kitchen etonogestrel (NEXPLANON) 68 MG IMPL implant 1 each by Subdermal route once. March 2017    . famotidine (PEPCID) 20 MG tablet Take 1 tablet (20 mg total) by mouth 2 (two) times daily. 60 tablet 0  . Ginkgo Biloba Extract 60 MG CAPS Take 1 capsule by mouth daily.     . insulin aspart (NOVOLOG FLEXPEN) 100 UNIT/ML FlexPen Inject 5-20 Units into the skin 3 (three) times daily with meals. Sliding scale    . Insulin Degludec (TRESIBA FLEXTOUCH) 100 UNIT/ML SOPN Inject 55 Units into the skin daily.     . ondansetron (ZOFRAN-ODT) 4 MG disintegrating tablet Take 4 mg by mouth every 8 (eight) hours as needed for nausea or vomiting.   0  . oxyCODONE-acetaminophen (PERCOCET) 7.5-325 MG tablet Take  1 tablet by mouth every 6 (six) hours as needed for severe pain. for pain  0  . Turmeric (RA TURMERIC) 500 MG CAPS Take 500 mg by mouth daily.     Marland Kitchen venlafaxine XR (EFFEXOR-XR) 75 MG 24 hr capsule Take 150 mg by mouth daily with breakfast.     No current facility-administered medications for this visit.    Medication Side Effects: None  Allergies:  Allergies  Allergen Reactions  . Aloe  Hives  . Duloxetine Other (See Comments)    RESTLESSNESS URGES TO MOVE  . Gabapentin Swelling    SWELLING REACTION OF FEET  . Olive Oil Hives  . Lyrica [Pregabalin] Other (See Comments)    Heart palpitations Fainting  . Tapentadol Swelling  . Zonisamide Other (See Comments)    WORSENING PAIN    Past Medical History:  Diagnosis Date  . Fibromyalgia   . Headache   . Insomnia   . Insulin dependent type 1 diabetes mellitus (HCC)   . 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  Recommend follow-up Pap smear/HPV at next annual exam.  . Peripheral neuropathy   . Seasonal allergies   . 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 Diabetes Mother   . Migraines Mother   . Hypertension Mother   . CVA Mother   . Breast cancer Maternal Grandmother   . Thyroid disease Maternal Grandmother   . Cancer Maternal Grandfather     Social History   Socioeconomic History  . Marital status: Single    Spouse name: Not on file  . Number of children: Not on file  . Years of education: Not on file  . Highest education level: Not on file  Occupational History  . Not on file  Tobacco Use  . Smoking status: Never Smoker  . Smokeless tobacco: Never Used  Substance and Sexual Activity  . Alcohol use: No    Alcohol/week: 0.0 standard drinks  . Drug use: No  . Sexual activity: Yes    Birth control/protection: Implant    Comment: 1ST INTERCOURSE- 25, PARTNERS - 6 . Nexplanon 06/27/2018  Other Topics Concern  . Not on file  Social History Narrative   Student.  Not married.  Lives alone in a one story home.   Not working.   Social Determinants of Health   Financial Resource Strain:   . Difficulty of Paying Living Expenses: Not on file  Food Insecurity:   . Worried About Programme researcher, broadcasting/film/video in the Last Year: Not on file  . Ran Out of Food in the Last  Year: Not on file  Transportation Needs:   . Lack of Transportation (Medical): Not on file  . Lack of Transportation (Non-Medical): Not on file  Physical Activity:   . Days of Exercise per Week: Not on file  . Minutes of Exercise per Session: Not on file  Stress:   . Feeling of Stress : Not on file  Social Connections:   . Frequency of Communication with Friends and Family: Not on file  . Frequency of Social Gatherings with Friends and Family: Not on file  . Attends Religious Services: Not on file  . Active Member of Clubs or Organizations: Not on file  . Attends Banker Meetings: Not on file  . Marital Status: Not on file  Intimate Partner Violence:   .  Fear of Current or Ex-Partner: Not on file  . Emotionally Abused: Not on file  . Physically Abused: Not on file  . Sexually Abused: Not on file    Past Medical History, Surgical history, Social history, and Family history were reviewed and updated as appropriate.   Please see review of systems for further details on the patient's review from today.   Objective:   Physical Exam:  There were no vitals taken for this visit.  Physical Exam Constitutional:      General: She is not in acute distress.    Appearance: She is well-developed.  Musculoskeletal:        General: No deformity.  Neurological:     Mental Status: She is alert and oriented to person, place, and time.     Coordination: Coordination normal.  Psychiatric:        Attention and Perception: Attention and perception normal. She does not perceive auditory or visual hallucinations.        Mood and Affect: Mood normal. Mood is not anxious or depressed. Affect is not labile, blunt, angry or inappropriate.        Speech: Speech normal.        Behavior: Behavior normal.        Thought Content: Thought content normal. Thought content is not paranoid or delusional. Thought content does not include homicidal or suicidal ideation. Thought content does not  include homicidal or suicidal plan.        Cognition and Memory: Cognition and memory normal.        Judgment: Judgment normal.     Comments: Insight intact     Lab Review:     Component Value Date/Time   NA 138 04/04/2019 0252   K 4.0 04/04/2019 0252   CL 108 04/04/2019 0252   CO2 23 04/04/2019 0252   GLUCOSE 148 (H) 04/04/2019 0252   BUN 10 04/04/2019 0252   CREATININE 0.62 04/04/2019 0252   CREATININE 0.55 12/25/2013 1455   CALCIUM 8.3 (L) 04/04/2019 0252   PROT 5.9 (L) 04/04/2019 0252   ALBUMIN 2.9 (L) 04/04/2019 0252   AST 406 (H) 04/04/2019 0252   ALT 380 (H) 04/04/2019 0252   ALKPHOS 239 (H) 04/04/2019 0252   BILITOT 1.0 04/04/2019 0252   GFRNONAA >60 04/04/2019 0252   GFRAA >60 04/04/2019 0252       Component Value Date/Time   WBC 6.5 04/04/2019 0252   RBC 3.26 (L) 04/04/2019 0252   HGB 10.0 (L) 04/04/2019 0252   HCT 31.5 (L) 04/04/2019 0252   PLT 292 04/04/2019 0252   MCV 96.6 04/04/2019 0252   MCV 97.7 (A) 05/24/2013 2021   MCH 30.7 04/04/2019 0252   MCHC 31.7 04/04/2019 0252   RDW 13.3 04/04/2019 0252   LYMPHSABS 3.4 10/26/2017 0607   MONOABS 0.9 10/26/2017 0607   EOSABS 0.1 10/26/2017 0607   BASOSABS 0.1 10/26/2017 0607    No results found for: POCLITH, LITHIUM   No results found for: PHENYTOIN, PHENOBARB, VALPROATE, CBMZ   .res Assessment: Plan:    Plan:  Adderall 20 mg - 1 every morning  Continue NAC 600 mg 1 twice daily. Continue Effexor XR 75 mg, 2 p.o. every morning. Continue Wellbutrin XL 450mg  daily - denies seizure history.  Refer for grief therapy  Return in 3 months  Discussed potential benefits, risks, and side effects of stimulants with patient to include increased heart rate, palpitations, insomnia, increased anxiety, increased irritability, or decreased appetite.  Instructed patient to  contact office if experiencing any significant tolerability issues.  Patient advised to contact office with any questions, adverse effects,  or acute worsening in signs and symptoms.  Rakia was seen today for anxiety, depression, insomnia and adhd.  Diagnoses and all orders for this visit:  Major depressive disorder, recurrent episode, moderate (HCC)  Attention deficit hyperactivity disorder (ADHD), other type -     amphetamine-dextroamphetamine (ADDERALL) 20 MG tablet; 1 po q am, 1/2 at noon.  Generalized anxiety disorder  Insomnia, unspecified type     Please see After Visit Summary for patient specific instructions.  Future Appointments  Date Time Provider [place]  05/01/2019  3:00 PM Barnie Del, LCSW CP-CP None  07/12/2019  2:00 PM Gianina Olinde, Berdie Ogren, NP CP-CP None  07/18/2019  2:00 PM Hayden Pedro, MD TRE-TRE None    No orders of the defined types were placed in this encounter.   -------------------------------

## 2019-05-01 ENCOUNTER — Ambulatory Visit: Payer: BC Managed Care – PPO | Admitting: Addiction (Substance Use Disorder)

## 2019-05-09 DIAGNOSIS — K117 Disturbances of salivary secretion: Secondary | ICD-10-CM | POA: Insufficient documentation

## 2019-05-09 DIAGNOSIS — J358 Other chronic diseases of tonsils and adenoids: Secondary | ICD-10-CM | POA: Insufficient documentation

## 2019-05-15 ENCOUNTER — Ambulatory Visit: Payer: BC Managed Care – PPO | Admitting: Addiction (Substance Use Disorder)

## 2019-05-30 ENCOUNTER — Telehealth: Payer: Self-pay | Admitting: Physician Assistant

## 2019-05-30 ENCOUNTER — Other Ambulatory Visit: Payer: Self-pay

## 2019-05-30 DIAGNOSIS — F908 Attention-deficit hyperactivity disorder, other type: Secondary | ICD-10-CM

## 2019-05-30 MED ORDER — AMPHETAMINE-DEXTROAMPHETAMINE 20 MG PO TABS: ORAL_TABLET | ORAL | 0 refills | Status: DC

## 2019-05-30 NOTE — Telephone Encounter (Signed)
Pt called requesting refill for Adderall @ [facility] [address] Looks like she sees Yvette Rack now not Melony Overly. Next appt 5/6

## 2019-05-30 NOTE — Telephone Encounter (Signed)
Last refill 04/27/2019, pended for Almira Coaster to submit

## 2019-06-18 ENCOUNTER — Ambulatory Visit: Payer: BC Managed Care – PPO

## 2019-06-20 ENCOUNTER — Ambulatory Visit: Payer: BC Managed Care – PPO | Admitting: Adult Health

## 2019-06-26 ENCOUNTER — Encounter (HOSPITAL_COMMUNITY): Payer: Self-pay | Admitting: Emergency Medicine

## 2019-06-26 ENCOUNTER — Other Ambulatory Visit: Payer: Self-pay

## 2019-06-26 ENCOUNTER — Inpatient Hospital Stay (HOSPITAL_COMMUNITY)
Admission: EM | Admit: 2019-06-26 | Discharge: 2019-06-30 | DRG: 419 | Disposition: A | Discharge status: Home / Self Care | Payer: BC Managed Care – PPO | Attending: General Surgery | Admitting: General Surgery

## 2019-06-26 DIAGNOSIS — Z794 Long term (current) use of insulin: Secondary | ICD-10-CM

## 2019-06-26 DIAGNOSIS — Z833 Family history of diabetes mellitus: Secondary | ICD-10-CM

## 2019-06-26 DIAGNOSIS — M797 Fibromyalgia: Secondary | ICD-10-CM | POA: Diagnosis present

## 2019-06-26 DIAGNOSIS — R7989 Other specified abnormal findings of blood chemistry: Secondary | ICD-10-CM | POA: Diagnosis present

## 2019-06-26 DIAGNOSIS — Z8249 Family history of ischemic heart disease and other diseases of the circulatory system: Secondary | ICD-10-CM

## 2019-06-26 DIAGNOSIS — R1011 Right upper quadrant pain: Secondary | ICD-10-CM

## 2019-06-26 DIAGNOSIS — G8929 Other chronic pain: Secondary | ICD-10-CM | POA: Diagnosis present

## 2019-06-26 DIAGNOSIS — K8000 Calculus of gallbladder with acute cholecystitis without obstruction: Principal | ICD-10-CM | POA: Diagnosis present

## 2019-06-26 DIAGNOSIS — Z20822 Contact with and (suspected) exposure to covid-19: Secondary | ICD-10-CM | POA: Diagnosis present

## 2019-06-26 DIAGNOSIS — K8020 Calculus of gallbladder without cholecystitis without obstruction: Secondary | ICD-10-CM | POA: Diagnosis not present

## 2019-06-26 DIAGNOSIS — Z823 Family history of stroke: Secondary | ICD-10-CM

## 2019-06-26 DIAGNOSIS — E109 Type 1 diabetes mellitus without complications: Secondary | ICD-10-CM | POA: Diagnosis present

## 2019-06-26 DIAGNOSIS — Z803 Family history of malignant neoplasm of breast: Secondary | ICD-10-CM

## 2019-06-26 DIAGNOSIS — E119 Type 2 diabetes mellitus without complications: Secondary | ICD-10-CM

## 2019-06-26 LAB — CBG MONITORING, ED: Glucose-Capillary: 104 mg/dL — ABNORMAL HIGH (ref 70–99)

## 2019-06-26 MED ORDER — ONDANSETRON 4 MG PO TBDP
4.0000 mg | ORAL_TABLET | Freq: Once | ORAL | Status: AC | PRN
  Administered 2019-06-27: 02:00:00 4 mg via ORAL
  Filled 2019-06-26: qty 1

## 2019-06-26 MED ORDER — SODIUM CHLORIDE 0.9% FLUSH
3.0000 mL | Freq: Once | INTRAVENOUS | Status: AC
  Administered 2019-06-27: 04:00:00 3 mL via INTRAVENOUS

## 2019-06-26 NOTE — ED Triage Notes (Signed)
Pt having 10/10 abd pain, nausea and vomiting since this morning.

## 2019-06-26 NOTE — ED Notes (Signed)
Please call aunt when roomed. Burna Mortimer [phone]

## 2019-06-27 ENCOUNTER — Observation Stay (HOSPITAL_COMMUNITY): Payer: BC Managed Care – PPO | Admitting: Certified Registered"

## 2019-06-27 ENCOUNTER — Emergency Department (HOSPITAL_COMMUNITY): Payer: BC Managed Care – PPO

## 2019-06-27 ENCOUNTER — Encounter (HOSPITAL_COMMUNITY): Admission: EM | Disposition: A | Discharge status: Home / Self Care | Payer: Self-pay | Source: Home / Self Care

## 2019-06-27 DIAGNOSIS — K8020 Calculus of gallbladder without cholecystitis without obstruction: Secondary | ICD-10-CM | POA: Diagnosis present

## 2019-06-27 HISTORY — PX: CHOLECYSTECTOMY: SHX55

## 2019-06-27 LAB — CBC
HCT: 37.8 % (ref 36.0–46.0)
Hemoglobin: 12.5 g/dL (ref 12.0–15.0)
MCH: 30.6 pg (ref 26.0–34.0)
MCHC: 33.1 g/dL (ref 30.0–36.0)
MCV: 92.4 fL (ref 80.0–100.0)
Platelets: 322 10*3/uL (ref 150–400)
RBC: 4.09 MIL/uL (ref 3.87–5.11)
RDW: 13.1 % (ref 11.5–15.5)
WBC: 10 10*3/uL (ref 4.0–10.5)
nRBC: 0 % (ref 0.0–0.2)

## 2019-06-27 LAB — URINALYSIS, ROUTINE W REFLEX MICROSCOPIC
Glucose, UA: NEGATIVE mg/dL
Hgb urine dipstick: NEGATIVE
Ketones, ur: NEGATIVE mg/dL
Leukocytes,Ua: NEGATIVE
Nitrite: NEGATIVE
Protein, ur: 30 mg/dL — AB
Specific Gravity, Urine: 1.034 — ABNORMAL HIGH (ref 1.005–1.030)
pH: 5 (ref 5.0–8.0)

## 2019-06-27 LAB — LIPASE, BLOOD: Lipase: 18 U/L (ref 11–51)

## 2019-06-27 LAB — GLUCOSE, CAPILLARY
Glucose-Capillary: 248 mg/dL — ABNORMAL HIGH (ref 70–99)
Glucose-Capillary: 265 mg/dL — ABNORMAL HIGH (ref 70–99)
Glucose-Capillary: 240 mg/dL — ABNORMAL HIGH (ref 70–99)
Glucose-Capillary: 223 mg/dL — ABNORMAL HIGH (ref 70–99)
Glucose-Capillary: 151 mg/dL — ABNORMAL HIGH (ref 70–99)

## 2019-06-27 LAB — COMPREHENSIVE METABOLIC PANEL
ALT: 188 U/L — ABNORMAL HIGH (ref 0–44)
AST: 524 U/L — ABNORMAL HIGH (ref 15–41)
Albumin: 3.7 g/dL (ref 3.5–5.0)
Alkaline Phosphatase: 109 U/L (ref 38–126)
Anion gap: 12 (ref 5–15)
BUN: 9 mg/dL (ref 6–20)
CO2: 23 mmol/L (ref 22–32)
Calcium: 9.6 mg/dL (ref 8.9–10.3)
Chloride: 103 mmol/L (ref 98–111)
Creatinine, Ser: 0.77 mg/dL (ref 0.44–1.00)
GFR calc Af Amer: >60 mL/min (ref >60)
GFR calc non Af Amer: >60 mL/min (ref >60)
Glucose, Bld: 109 mg/dL — ABNORMAL HIGH (ref 70–99)
Potassium: 3.5 mmol/L (ref 3.5–5.1)
Sodium: 138 mmol/L (ref 135–145)
Total Bilirubin: 0.8 mg/dL (ref 0.3–1.2)
Total Protein: 7.4 g/dL (ref 6.5–8.1)

## 2019-06-27 LAB — I-STAT BETA HCG BLOOD, ED (MC, WL, AP ONLY): I-stat hCG, quantitative: <5 m[IU]/mL (ref <5)

## 2019-06-27 LAB — RESPIRATORY PANEL BY RT PCR (FLU A&B, COVID)
Influenza A by PCR: NEGATIVE
Influenza B by PCR: NEGATIVE
SARS Coronavirus 2 by RT PCR: NEGATIVE

## 2019-06-27 LAB — CBG MONITORING, ED
Glucose-Capillary: 119 mg/dL — ABNORMAL HIGH (ref 70–99)
Glucose-Capillary: 217 mg/dL — ABNORMAL HIGH (ref 70–99)
Glucose-Capillary: 266 mg/dL — ABNORMAL HIGH (ref 70–99)
Glucose-Capillary: 73 mg/dL (ref 70–99)

## 2019-06-27 LAB — HEMOGLOBIN A1C
Hgb A1c MFr Bld: 10.3 % — ABNORMAL HIGH (ref 4.8–5.6)
Mean Plasma Glucose: 248.91 mg/dL

## 2019-06-27 SURGERY — LAPAROSCOPIC CHOLECYSTECTOMY
Anesthesia: General | Site: Abdomen

## 2019-06-27 MED ORDER — ONDANSETRON HCL 4 MG/2ML IJ SOLN
4.0000 mg | Freq: Four times a day (QID) | INTRAMUSCULAR | Status: DC | PRN
  Administered 2019-06-27 – 2019-06-28 (×5): 4 mg via INTRAVENOUS
  Filled 2019-06-27 (×5): qty 2

## 2019-06-27 MED ORDER — PROMETHAZINE HCL 25 MG/ML IJ SOLN
25.0000 mg | Freq: Once | INTRAMUSCULAR | Status: AC
  Administered 2019-06-27: 25 mg via INTRAVENOUS
  Filled 2019-06-27: qty 1

## 2019-06-27 MED ORDER — INSULIN ASPART 100 UNIT/ML ~~LOC~~ SOLN: 0.0000 [IU] | Freq: Every day | SUBCUTANEOUS | Status: DC

## 2019-06-27 MED ORDER — HYDROMORPHONE HCL 1 MG/ML IJ SOLN
1.0000 mg | INTRAMUSCULAR | Status: DC | PRN
  Administered 2019-06-27 – 2019-06-28 (×6): 1 mg via INTRAVENOUS
  Filled 2019-06-27 (×6): qty 1

## 2019-06-27 MED ORDER — INSULIN ASPART 100 UNIT/ML ~~LOC~~ SOLN
SUBCUTANEOUS | Status: AC
  Administered 2019-06-27: 4 [IU] via SUBCUTANEOUS
  Filled 2019-06-27: qty 1

## 2019-06-27 MED ORDER — OXYCODONE HCL 5 MG PO TABS
5.0000 mg | ORAL_TABLET | ORAL | Status: DC | PRN
  Administered 2019-06-27: 17:00:00 10 mg via ORAL
  Filled 2019-06-27 (×2): qty 2

## 2019-06-27 MED ORDER — KETOROLAC TROMETHAMINE 30 MG/ML IJ SOLN
INTRAMUSCULAR | Status: AC
  Filled 2019-06-27: qty 1

## 2019-06-27 MED ORDER — SUGAMMADEX SODIUM 200 MG/2ML IV SOLN
INTRAVENOUS | Status: DC | PRN
  Administered 2019-06-27: 200 mg via INTRAVENOUS

## 2019-06-27 MED ORDER — PHENYLEPHRINE 40 MCG/ML (10ML) SYRINGE FOR IV PUSH (FOR BLOOD PRESSURE SUPPORT)
PREFILLED_SYRINGE | INTRAVENOUS | Status: DC | PRN
  Administered 2019-06-27: 80 ug via INTRAVENOUS

## 2019-06-27 MED ORDER — METOPROLOL TARTRATE 5 MG/5ML IV SOLN: 5.0000 mg | Freq: Three times a day (TID) | INTRAVENOUS | Status: DC | PRN

## 2019-06-27 MED ORDER — BUPIVACAINE HCL (PF) 0.25 % IJ SOLN
INTRAMUSCULAR | Status: DC | PRN
  Administered 2019-06-27: 20 mL

## 2019-06-27 MED ORDER — LACTATED RINGERS IV SOLN: INTRAVENOUS | Status: DC | PRN

## 2019-06-27 MED ORDER — HYDROMORPHONE HCL 1 MG/ML IJ SOLN
INTRAMUSCULAR | Status: AC
  Administered 2019-06-27: 0.5 mg via INTRAVENOUS
  Filled 2019-06-27: qty 1

## 2019-06-27 MED ORDER — PROMETHAZINE HCL 25 MG/ML IJ SOLN
INTRAMUSCULAR | Status: AC
  Filled 2019-06-27: qty 1

## 2019-06-27 MED ORDER — ONDANSETRON HCL 4 MG/2ML IJ SOLN
4.0000 mg | Freq: Once | INTRAMUSCULAR | Status: AC
  Administered 2019-06-27: 4 mg via INTRAVENOUS
  Filled 2019-06-27: qty 2

## 2019-06-27 MED ORDER — LIDOCAINE 2% (20 MG/ML) 5 ML SYRINGE
INTRAMUSCULAR | Status: DC | PRN
  Administered 2019-06-27: 100 mg via INTRAVENOUS

## 2019-06-27 MED ORDER — ONDANSETRON 4 MG PO TBDP
4.0000 mg | ORAL_TABLET | Freq: Four times a day (QID) | ORAL | Status: DC | PRN
  Filled 2019-06-27: qty 1

## 2019-06-27 MED ORDER — DEXAMETHASONE SODIUM PHOSPHATE 10 MG/ML IJ SOLN
INTRAMUSCULAR | Status: AC
  Filled 2019-06-27: qty 1

## 2019-06-27 MED ORDER — FENTANYL CITRATE (PF) 100 MCG/2ML IJ SOLN
INTRAMUSCULAR | Status: DC | PRN
  Administered 2019-06-27: 50 ug via INTRAVENOUS
  Administered 2019-06-27: 100 ug via INTRAVENOUS
  Administered 2019-06-27: 150 ug via INTRAVENOUS

## 2019-06-27 MED ORDER — ACETAMINOPHEN 650 MG RE SUPP: 650.0000 mg | Freq: Four times a day (QID) | RECTAL | Status: DC | PRN

## 2019-06-27 MED ORDER — FENTANYL CITRATE (PF) 250 MCG/5ML IJ SOLN
INTRAMUSCULAR | Status: AC
  Filled 2019-06-27: qty 5

## 2019-06-27 MED ORDER — DEXMEDETOMIDINE HCL 200 MCG/2ML IV SOLN
INTRAVENOUS | Status: DC | PRN
  Administered 2019-06-27 (×2): 10 ug via INTRAVENOUS
  Administered 2019-06-27: 12 ug via INTRAVENOUS
  Administered 2019-06-27: 8 ug via INTRAVENOUS

## 2019-06-27 MED ORDER — ROCURONIUM BROMIDE 50 MG/5ML IV SOSY
PREFILLED_SYRINGE | INTRAVENOUS | Status: DC | PRN
  Administered 2019-06-27: 40 mg via INTRAVENOUS

## 2019-06-27 MED ORDER — ENOXAPARIN SODIUM 40 MG/0.4ML ~~LOC~~ SOLN
40.0000 mg | SUBCUTANEOUS | Status: DC
  Administered 2019-06-27 – 2019-06-30 (×4): 40 mg via SUBCUTANEOUS
  Filled 2019-06-27 (×4): qty 0.4

## 2019-06-27 MED ORDER — ACETAMINOPHEN 325 MG PO TABS: 650.0000 mg | ORAL_TABLET | Freq: Four times a day (QID) | ORAL | Status: DC | PRN

## 2019-06-27 MED ORDER — HYDROMORPHONE HCL 1 MG/ML IJ SOLN
0.2500 mg | INTRAMUSCULAR | Status: DC | PRN
  Administered 2019-06-27: 15:00:00 0.5 mg via INTRAVENOUS

## 2019-06-27 MED ORDER — SUCCINYLCHOLINE CHLORIDE 200 MG/10ML IV SOSY
PREFILLED_SYRINGE | INTRAVENOUS | Status: DC | PRN
  Administered 2019-06-27: 140 mg via INTRAVENOUS

## 2019-06-27 MED ORDER — FENTANYL CITRATE (PF) 100 MCG/2ML IJ SOLN
100.0000 ug | Freq: Once | INTRAMUSCULAR | Status: AC
  Administered 2019-06-27: 04:00:00 100 ug via INTRAVENOUS
  Filled 2019-06-27: qty 2

## 2019-06-27 MED ORDER — KETOROLAC TROMETHAMINE 30 MG/ML IJ SOLN
30.0000 mg | Freq: Once | INTRAMUSCULAR | Status: AC | PRN
  Administered 2019-06-27: 14:00:00 30 mg via INTRAVENOUS

## 2019-06-27 MED ORDER — SCOPOLAMINE 1 MG/3DAYS TD PT72
MEDICATED_PATCH | TRANSDERMAL | Status: DC | PRN
  Administered 2019-06-27: 1 via TRANSDERMAL

## 2019-06-27 MED ORDER — PROMETHAZINE HCL 25 MG/ML IJ SOLN
6.2500 mg | INTRAMUSCULAR | Status: DC | PRN
  Administered 2019-06-27: 6.25 mg via INTRAVENOUS

## 2019-06-27 MED ORDER — DIPHENHYDRAMINE HCL 50 MG/ML IJ SOLN
INTRAMUSCULAR | Status: DC | PRN
  Administered 2019-06-27: 25 mg via INTRAVENOUS

## 2019-06-27 MED ORDER — SODIUM CHLORIDE 0.9 % IV SOLN: INTRAVENOUS | Status: DC

## 2019-06-27 MED ORDER — ONDANSETRON HCL 4 MG/2ML IJ SOLN
4.0000 mg | Freq: Once | INTRAMUSCULAR | Status: AC
  Administered 2019-06-27: 04:00:00 4 mg via INTRAVENOUS
  Filled 2019-06-27: qty 2

## 2019-06-27 MED ORDER — MEPERIDINE HCL 25 MG/ML IJ SOLN: 6.2500 mg | INTRAMUSCULAR | Status: DC | PRN

## 2019-06-27 MED ORDER — INSULIN ASPART 100 UNIT/ML ~~LOC~~ SOLN
0.0000 [IU] | Freq: Three times a day (TID) | SUBCUTANEOUS | Status: DC
  Administered 2019-06-27: 5 [IU] via SUBCUTANEOUS
  Administered 2019-06-28: 18:00:00 3 [IU] via SUBCUTANEOUS
  Administered 2019-06-28: 5 [IU] via SUBCUTANEOUS
  Administered 2019-06-28 – 2019-06-29 (×2): 3 [IU] via SUBCUTANEOUS

## 2019-06-27 MED ORDER — LIDOCAINE 2% (20 MG/ML) 5 ML SYRINGE
INTRAMUSCULAR | Status: AC
  Filled 2019-06-27: qty 5

## 2019-06-27 MED ORDER — SODIUM CHLORIDE 0.9 % IV SOLN
2.0000 g | INTRAVENOUS | Status: DC
  Administered 2019-06-27: 2 g via INTRAVENOUS
  Filled 2019-06-27 (×2): qty 20

## 2019-06-27 MED ORDER — MIDAZOLAM HCL 2 MG/2ML IJ SOLN
INTRAMUSCULAR | Status: AC
  Filled 2019-06-27: qty 2

## 2019-06-27 MED ORDER — ROCURONIUM BROMIDE 10 MG/ML (PF) SYRINGE
PREFILLED_SYRINGE | INTRAVENOUS | Status: AC
  Filled 2019-06-27: qty 10

## 2019-06-27 MED ORDER — DIPHENHYDRAMINE HCL 50 MG/ML IJ SOLN
INTRAMUSCULAR | Status: AC
  Filled 2019-06-27: qty 1

## 2019-06-27 MED ORDER — 0.9 % SODIUM CHLORIDE (POUR BTL) OPTIME
TOPICAL | Status: DC | PRN
  Administered 2019-06-27: 1000 mL

## 2019-06-27 MED ORDER — PHENYLEPHRINE 40 MCG/ML (10ML) SYRINGE FOR IV PUSH (FOR BLOOD PRESSURE SUPPORT)
PREFILLED_SYRINGE | INTRAVENOUS | Status: AC
  Filled 2019-06-27: qty 10

## 2019-06-27 MED ORDER — BUPIVACAINE-EPINEPHRINE 0.25% -1:200000 IJ SOLN: INTRAMUSCULAR | Status: DC | PRN

## 2019-06-27 MED ORDER — SODIUM CHLORIDE 0.9 % IR SOLN
Status: DC | PRN
  Administered 2019-06-27: 1000 mL

## 2019-06-27 MED ORDER — ONDANSETRON HCL 4 MG/2ML IJ SOLN
INTRAMUSCULAR | Status: DC | PRN
  Administered 2019-06-27: 4 mg via INTRAVENOUS

## 2019-06-27 MED ORDER — INSULIN ASPART 100 UNIT/ML ~~LOC~~ SOLN: 4.0000 [IU] | Freq: Once | SUBCUTANEOUS | Status: AC

## 2019-06-27 MED ORDER — MIDAZOLAM HCL 5 MG/5ML IJ SOLN
INTRAMUSCULAR | Status: DC | PRN
  Administered 2019-06-27: 1.5 mg via INTRAVENOUS
  Administered 2019-06-27: .5 mg via INTRAVENOUS

## 2019-06-27 MED ORDER — PROPOFOL 10 MG/ML IV BOLUS
INTRAVENOUS | Status: AC
  Filled 2019-06-27: qty 20

## 2019-06-27 MED ORDER — SCOPOLAMINE 1 MG/3DAYS TD PT72
MEDICATED_PATCH | TRANSDERMAL | Status: AC
  Filled 2019-06-27: qty 1

## 2019-06-27 MED ORDER — BUPIVACAINE HCL (PF) 0.25 % IJ SOLN
INTRAMUSCULAR | Status: AC
  Filled 2019-06-27: qty 30

## 2019-06-27 MED ORDER — ONDANSETRON HCL 4 MG/2ML IJ SOLN
INTRAMUSCULAR | Status: AC
  Filled 2019-06-27: qty 2

## 2019-06-27 MED ORDER — HYDROMORPHONE HCL 1 MG/ML IJ SOLN
1.0000 mg | Freq: Once | INTRAMUSCULAR | Status: AC
  Administered 2019-06-27: 1 mg via INTRAVENOUS
  Filled 2019-06-27: qty 1

## 2019-06-27 MED ORDER — PROPOFOL 10 MG/ML IV BOLUS
INTRAVENOUS | Status: DC | PRN
  Administered 2019-06-27: 200 mg via INTRAVENOUS

## 2019-06-27 SURGICAL SUPPLY — 44 items
APPLIER CLIP 5 13 M/L LIGAMAX5 (MISCELLANEOUS) ×3
BLADE CLIPPER SURG (BLADE) IMPLANT
CANISTER SUCT 3000ML PPV (MISCELLANEOUS) ×3 IMPLANT
CHLORAPREP W/TINT 26 (MISCELLANEOUS) ×3 IMPLANT
CLIP APPLIE 5 13 M/L LIGAMAX5 (MISCELLANEOUS) ×2 IMPLANT
COVER MAYO STAND STRL (DRAPES) IMPLANT
COVER SURGICAL LIGHT HANDLE (MISCELLANEOUS) ×3 IMPLANT
COVER WAND RF STERILE (DRAPES) ×3 IMPLANT
DERMABOND ADVANCED (GAUZE/BANDAGES/DRESSINGS) ×1
DERMABOND ADVANCED .7 DNX12 (GAUZE/BANDAGES/DRESSINGS) ×2 IMPLANT
DRAPE C-ARM 42X120 X-RAY (DRAPES) IMPLANT
ELECT REM PT RETURN 9FT ADLT (ELECTROSURGICAL) ×3
ELECTRODE REM PT RTRN 9FT ADLT (ELECTROSURGICAL) ×2 IMPLANT
FILTER SMOKE EVAC LAPAROSHD (FILTER) IMPLANT
GLOVE BIO SURGEON STRL SZ8 (GLOVE) ×3 IMPLANT
GLOVE BIOGEL PI IND STRL 8 (GLOVE) ×2 IMPLANT
GLOVE BIOGEL PI INDICATOR 8 (GLOVE) ×1
GOWN STRL REUS W/ TWL LRG LVL3 (GOWN DISPOSABLE) ×4 IMPLANT
GOWN STRL REUS W/ TWL XL LVL3 (GOWN DISPOSABLE) ×2 IMPLANT
GOWN STRL REUS W/TWL LRG LVL3 (GOWN DISPOSABLE) ×2
GOWN STRL REUS W/TWL XL LVL3 (GOWN DISPOSABLE) ×1
KIT BASIN OR (CUSTOM PROCEDURE TRAY) ×3 IMPLANT
KIT TURNOVER KIT B (KITS) ×3 IMPLANT
L-HOOK LAP DISP 36CM (ELECTROSURGICAL) ×3
LHOOK LAP DISP 36CM (ELECTROSURGICAL) ×2 IMPLANT
NEEDLE 22X1 1/2 (OR ONLY) (NEEDLE) ×3 IMPLANT
NS IRRIG 1000ML POUR BTL (IV SOLUTION) ×3 IMPLANT
PAD ARMBOARD 7.5X6 YLW CONV (MISCELLANEOUS) ×3 IMPLANT
PENCIL BUTTON HOLSTER BLD 10FT (ELECTRODE) ×3 IMPLANT
POUCH RETRIEVAL ECOSAC 10 (ENDOMECHANICALS) ×2 IMPLANT
POUCH RETRIEVAL ECOSAC 10MM (ENDOMECHANICALS) ×1
SCISSORS LAP 5X35 DISP (ENDOMECHANICALS) ×3 IMPLANT
SET CHOLANGIOGRAPH 5 50 .035 (SET/KITS/TRAYS/PACK) IMPLANT
SET IRRIG TUBING LAPAROSCOPIC (IRRIGATION / IRRIGATOR) ×3 IMPLANT
SET TUBE SMOKE EVAC HIGH FLOW (TUBING) ×3 IMPLANT
SLEEVE ENDOPATH XCEL 5M (ENDOMECHANICALS) ×6 IMPLANT
SPECIMEN JAR SMALL (MISCELLANEOUS) ×3 IMPLANT
SUT VIC AB 4-0 PS2 27 (SUTURE) ×3 IMPLANT
TOWEL GREEN STERILE (TOWEL DISPOSABLE) ×3 IMPLANT
TOWEL GREEN STERILE FF (TOWEL DISPOSABLE) ×3 IMPLANT
TRAY LAPAROSCOPIC MC (CUSTOM PROCEDURE TRAY) ×3 IMPLANT
TROCAR XCEL BLUNT TIP 100MML (ENDOMECHANICALS) ×3 IMPLANT
TROCAR XCEL NON-BLD 5MMX100MML (ENDOMECHANICALS) ×3 IMPLANT
WATER STERILE IRR 1000ML POUR (IV SOLUTION) ×3 IMPLANT

## 2019-06-27 NOTE — Transfer of Care (Signed)
Immediate Anesthesia Transfer of Care Note  Patient: Theresa [name]  Procedure(s) Performed: LAPAROSCOPIC CHOLECYSTECTOMY (N/A Abdomen)  Patient Location: PACU  Anesthesia Type:General  Level of Consciousness: drowsy and patient cooperative  Airway & Oxygen Therapy: Patient Spontanous Breathing and Patient connected to nasal cannula oxygen  Post-op Assessment: Report given to RN and Post -op Vital signs reviewed and stable  Post vital signs: Reviewed and stable  Last Vitals:  Vitals Value Taken Time  BP 133/68 06/27/19 1341  Temp    Pulse    Resp 32 06/27/19 1341  SpO2    Vitals shown include unvalidated device data.  Last Pain:  Vitals:   06/27/19 1100  TempSrc: Oral  PainSc: 10-Worst pain ever         Complications: No apparent anesthesia complications

## 2019-06-27 NOTE — Progress Notes (Addendum)
Inpatient Diabetes Program Recommendations  AACE/ADA: New Consensus Statement on Inpatient Glycemic Control (2015)  Target Ranges:  Prepandial:   less than 140 mg/dL      Peak postprandial:   less than 180 mg/dL (1-2 hours)      Critically ill patients:  140 - 180 mg/dL   Lab Results  Component Value Date   GLUCAP 217 (H) 06/27/2019   HGBA1C 10.3 (H) 06/27/2019    Review of Glycemic Control  Results for CONSWELLA, BRUNEY (MRN [id number]) as of 06/27/2019 10:41  Ref. Range 06/26/2019 23:49 06/27/2019 00:35 06/27/2019 06:28 06/27/2019 07:39  Glucose-Capillary Latest Ref Range: 70 - 99 mg/dL 714 (H) 232 (H) 73 009 (H)    Diabetes history: DM1  Outpatient Diabetes medications: tresiba 50 units QD, novolog 5-20 units SSI  Current orders for Inpatient glycemic control: Novolog 0-15 units tidwc, 0-5 units qhs  Inpatient Diabetes Program Recommendations:     If blood sugar continues to increase, please consider,  Lantus 25 units daily (50% of home dose)  Decrease Novolog correction 0-9 units TID with meals  Novolog 3 units TID with meals if eats at least 50% of meal and CBG >8mg /dl  Thank you, Dulce Sellar, RN, BSN Diabetes Coordinator Inpatient Diabetes Program [phone] (team pager from 8a-5p)

## 2019-06-27 NOTE — Op Note (Signed)
°  06/27/2019  1:33 PM  PATIENT:  Theresa [name]  29 y.o. female  PRE-OPERATIVE DIAGNOSIS:  CHOLECYSTITIS  POST-OPERATIVE DIAGNOSIS:  CHOLECYSTITIS  PROCEDURE:  Procedure(s): LAPAROSCOPIC CHOLECYSTECTOMY  SURGEON:  Surgeon(s): Violeta Gelinas, MD  ASSISTANTS: none   ANESTHESIA:   local and general  EBL:  Total I/O In: 1100 [I.V.:1000; IV Piggyback:100] Out: -   BLOOD ADMINISTERED:none  DRAINS: none   SPECIMEN:  Excision  DISPOSITION OF SPECIMEN:  PATHOLOGY  COUNTS:  YES  DICTATION: .Dragon Dictation Findings: Acute cholecystitis, replaced right hepatic artery  Procedure in detail: Informed consent was obtained.  She received intravenous antibiotics.  She was brought to the operating room and general endotracheal anesthesia was administered by the anesthesia staff.  Her abdomen was prepped and draped in a sterile fashion.  We did a timeout procedure.The supraumbilical region was infiltrated with local. Supraumbilical incision was made. Subcutaneous tissues were dissected down revealing the anterior fascia. This was divided sharply along the midline. Peritoneal cavity was entered under direct vision without complication. A 0 Vicryl pursestring was placed around the fascial opening. Hassan trocar was inserted into the abdomen. The abdomen was insufflated with carbon dioxide in standard fashion. Under direct vision a 5 mm epigastric and 5 mm right port x2 were placed using local.  Laparoscopic exploration revealed a very inflamed gallbladder.  Some omental adhesions were swept down off the body.  The dome was retracted superior medially.  The infundibulum was then retracted inferior laterally.  Some further filmy adhesions to the duodenum were gently swept away.  Dissection began laterally and progressed medially identifying the cystic duct.  I also identified a replaced right hepatic artery from which the cystic artery arose.  The cystic duct was dissected until we had a critical view  of safety.  3 clips were placed proximally, one was placed distally and it was divided.  The cystic artery was then clipped twice proximally as it exited the replaced right hepatic artery.  It was clipped once distally and divided.  The gallbladder was taken off the liver bed using cautery and achieving excellent hemostasis.  It was placed in a bag and sent to pathology.  The liver bed was irrigated.  Hemostasis was ensured.  Irrigation was evacuated.  Ports were removed under direct vision.  Pneumoperitoneum was released.  Supraumbilical fascia was closed by tying the pursestring.  All 4 wounds were irrigated and the skin of each was closed with 4-0 Vicryl followed by Dermabond.  All counts were correct.  She tolerated the procedure well without apparent complication and was taken recovery in stable condition.  PATIENT DISPOSITION:  PACU - hemodynamically stable.   Delay start of Pharmacological VTE agent (>24hrs) due to surgical blood loss or risk of bleeding:  no  Violeta Gelinas, MD, MPH, FACS Pager: [phone]  4/21/20211:33 PM

## 2019-06-27 NOTE — ED Notes (Signed)
Pt reports she does not want to get labeled bc she the pain is so bad. She does report that the nausea is better. Gown and mask changed due to soiled with emesis. Lights off; TV turned on and call light within reach.

## 2019-06-27 NOTE — Plan of Care (Signed)
  Problem: Education: Goal: Knowledge of General Education information will improve Description: Including pain rating scale, medication(s)/side effects and non-pharmacologic comfort measures Outcome: Progressing  Discussed management of nausea with meds, goal of being able to keep food intake down.  Problem: Clinical Measurements: Goal: Will remain free from infection Outcome: Progressing  Patient afebrile.  Problem: Activity: Goal: Risk for activity intolerance will decrease Outcome: Progressing  Patient up to bathroom without c/o dyspnea or fatigue.

## 2019-06-27 NOTE — Anesthesia Procedure Notes (Signed)
Procedure Name: Intubation Date/Time: 06/27/2019 12:39 PM Performed by: Orlie Dakin, CRNA Pre-anesthesia Checklist: Emergency Drugs available, Patient identified, Suction available and Patient being monitored Patient Re-evaluated:Patient Re-evaluated prior to induction Oxygen Delivery Method: Circle system utilized Preoxygenation: Pre-oxygenation with 100% oxygen Induction Type: IV induction, Rapid sequence and Cricoid Pressure applied Laryngoscope Size: Mac and 4 Grade View: Grade I Tube type: Oral Tube size: 7.0 mm Number of attempts: 1 Airway Equipment and Method: Stylet Placement Confirmation: ETT inserted through vocal cords under direct vision,  positive ETCO2 and breath sounds checked- equal and bilateral Secured at: 23 cm Tube secured with: Tape Dental Injury: Teeth and Oropharynx as per pre-operative assessment  Comments: 4x4s bite block used at end of proc.

## 2019-06-27 NOTE — Anesthesia Preprocedure Evaluation (Signed)
Anesthesia Evaluation  Patient identified by MRN, date of birth, ID band Patient awake    Reviewed: Allergy & Precautions, NPO status , Patient's Chart, lab work & pertinent test results  Airway Mallampati: I       Dental no notable dental hx. (+) Teeth Intact   Pulmonary neg pulmonary ROS,    Pulmonary exam normal breath sounds clear to auscultation       Cardiovascular negative cardio ROS Normal cardiovascular exam Rhythm:Regular Rate:Normal     Neuro/Psych PSYCHIATRIC DISORDERS Anxiety    GI/Hepatic Neg liver ROS,   Endo/Other  diabetes, Type 1, Insulin Dependent  Renal/GU negative Renal ROS  negative genitourinary   Musculoskeletal   Abdominal (+) + obese,   Peds  Hematology negative hematology ROS (+)   Anesthesia Other Findings   Reproductive/Obstetrics                             Anesthesia Physical Anesthesia Plan  ASA: II  Anesthesia Plan: General   Post-op Pain Management:    Induction: Intravenous  PONV Risk Score and Plan: 4 or greater and Ondansetron, Dexamethasone and Midazolam  Airway Management Planned: Oral ETT  Additional Equipment: None  Intra-op Plan:   Post-operative Plan: Extubation in OR  Informed Consent: I have reviewed the patients History and Physical, chart, labs and discussed the procedure including the risks, benefits and alternatives for the proposed anesthesia with the patient or authorized representative who has indicated his/her understanding and acceptance.     Dental advisory given  Plan Discussed with: CRNA  Anesthesia Plan Comments:         Anesthesia Quick Evaluation

## 2019-06-27 NOTE — Progress Notes (Signed)
   Subjective/Chief Complaint: C/O nausea and upper abdominal pain   Objective: Vital signs in last 24 hours: Temp:  [98.2 F (36.8 C)] 98.2 F (36.8 C) (04/20 2344) Pulse Rate:  [74-101] 95 (04/21 0900) Resp:  [12-23] 12 (04/21 0900) BP: (138-161)/(85-102) 148/89 (04/21 0900) SpO2:  [96 %-100 %] 100 % (04/21 0900) Weight:  [91.7 kg] 91.7 kg (04/20 2342)    Intake/Output from previous day: No intake/output data recorded. Intake/Output this shift: No intake/output data recorded.  General appearance: cooperative Resp: clear to auscultation bilaterally Cardio: regular rate and rhythm GI: soft, tender RUQ  Lab Results:  Recent Labs    06/27/19 0105  WBC 10.0  HGB 12.5  HCT 37.8  PLT 322   BMET Recent Labs    06/27/19 0105  NA 138  K 3.5  CL 103  CO2 23  GLUCOSE 109*  BUN 9  CREATININE 0.77  CALCIUM 9.6   PT/INR No results for input(s): LABPROT, INR in the last 72 hours. ABG No results for input(s): PHART, HCO3 in the last 72 hours.  Invalid input(s): PCO2, PO2  Studies/Results: DG Chest Port 1 View  Result Date: 06/27/2019 CLINICAL DATA:  Severe abdominal pain. Nausea and vomiting. Shortness of breath. EXAM: PORTABLE CHEST 1 VIEW COMPARISON:  One-view chest x-ray 04/03/2019 FINDINGS: The heart size is normal, exaggerated by low lung volumes. Mild interstitial prominence is present the infrahilar regions bilaterally. No significant airspace consolidation is present. No effusions are present. IMPRESSION: Low lung volumes and mild interstitial prominence, likely atelectasis. Infection is considered less likely. Electronically Signed   By: Marin Roberts M.D.   On: 06/27/2019 04:13   US Abdomen Limited RUQ  Result Date: 06/27/2019 CLINICAL DATA:  Right upper quadrant pain. EXAM: ULTRASOUND ABDOMEN LIMITED RIGHT UPPER QUADRANT COMPARISON:  Abdominal ultrasound 04/03/19.  MRCP 04/03/2019. FINDINGS: Gallbladder: Mobile gallstones are again seen. Focal wall  thickening is present. No sonographic Eulah Pont sign is reported. Common bile duct: Diameter: 5 mm, within normal limits Liver: No focal lesion identified. Within normal limits in parenchymal echogenicity. Portal vein is patent on color Doppler imaging with normal direction of blood flow towards the liver. Other: None. IMPRESSION: 1. Cholelithiasis without evidence for cholecystitis. Electronically Signed   By: Marin Roberts M.D.   On: 06/27/2019 04:40    Anti-infectives: Anti-infectives (From admission, onward)   Start     Dose/Rate Route Frequency Ordered Stop   06/27/19 0615  cefTRIAXone (ROCEPHIN) 2 g in sodium chloride 0.9 % 100 mL IVPB     2 g 200 mL/hr over 30 Minutes Intravenous Every 24 hours 06/27/19 1610        Assessment/Plan: Cholecystitis - to OR for laparoscopic cholecystectomy. Procedure, risks, and benefits discussed and she agrees. Got Rocephin IV.  LOS: 0 days    Theresa [name] 06/27/2019

## 2019-06-27 NOTE — ED Notes (Signed)
Consent placed at bedside.

## 2019-06-27 NOTE — ED Provider Notes (Signed)
[facility] EMERGENCY DEPARTMENT Provider Note   CSN: [id number] Arrival date & time: 06/26/19  2336     History Chief Complaint  Patient presents with  . Abdominal Pain    Theresa [name] is a 29 y.o. female.  The history is provided by the patient.  Abdominal Pain Pain location:  Epigastric and RUQ Pain quality: sharp   Pain severity:  Severe Duration:  1 day Timing:  Constant Progression:  Worsening Chronicity:  New Relieved by:  Nothing Worsened by:  Movement and palpation Associated symptoms: no dysuria, no fever and no vaginal bleeding    Patient reports onset of upper abdominal pain nausea vomiting the past day.  Reports since having vomiting, she is having chest pain and short of breath.  She reports the pain originated in her upper abdomen.  Similar to an episode when she was admitted earlier this year.     Past Medical History:  Diagnosis Date  . Fibromyalgia   . Headache   . Insomnia   . Insulin dependent type 1 diabetes mellitus (HCC)   . 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  Recommend follow-up Pap smear/HPV at next annual exam.  . Peripheral neuropathy   . Seasonal allergies   . 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 Diabetes mellitus with polyneuropathy (HCC) 04/10/2019  . CN (constipation) 04/10/2019  . Allergic rhinitis 04/10/2019  . Diabetes mellitus type 2, uncontrolled (HCC) 04/10/2019  . Abdominal pain, acute, generalized 04/03/2019  . Nausea and vomiting 04/03/2019  . Liver lesion, left lobe 10/26/2017  . Hypokalemia 10/26/2017  . Anxiety 10/26/2017  . 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 Proliferative diabetic retinopathy of both eyes (HCC) 11/04/2015  . 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  Peripheral neuropathy, idiopathic 07/28/2015  . Insomnia 06/04/2014  . Total body pain 04/10/2014  . Neuropathic pain of both feet 02/27/2014  . Bilateral thoracic back pain 02/07/2014  . Infection of skin and subcutaneous tissue 11/21/2013  . Peripheral neuralgia 11/21/2013  . Fibrositis 09/29/2013  . Intractable nausea and vomiting 09/29/2013  . DKA (diabetic ketoacidoses) (HCC) 07/26/2013  . Type 1 diabetes mellitus (HCC) 11/22/2011  . Yeast vaginitis 11/22/2011    Past Surgical History:  Procedure Laterality Date  . CATARACT EXTRACTION W/ INTRAOCULAR LENS  IMPLANT, BILATERAL Bilateral 05/2014 - 06/2014  . 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 EYE SURGERY    . MASS EXCISION Right 07/10/2013   Procedure: MINOR EXCISION RIGHT NECK CYST;  Surgeon: Drema Halon, MD;  Location: [facility];  Service: ENT;  Laterality: Right;  . Nexplanon  06/27/2018  . PARS PLANA REPAIR OF RETINAL DEATACHMENT Right 11/04/2015  . PARS PLANA VITRECTOMY Right 11/04/2015   Procedure: PARS PLANA VITRECTOMY WITH 25 GAUGE TO REPAIR A COMPLEX TRACTION RETINAL DETACHMENT;  Surgeon: Sherrie George, MD;  Location: [facility] OR;  Service: Ophthalmology;  Laterality: Right;  . WISDOM TOOTH EXTRACTION  09/2015     OB History    Gravida  1   Para      Term      Preterm      AB  1   Living  0     SAB  1   TAB      Ectopic      Multiple      Live Births              Family History  Problem Relation Age of Onset  . Diabetes Mother   . Migraines Mother   . Hypertension Mother   . CVA Mother   . Breast cancer Maternal Grandmother   . Thyroid disease Maternal Grandmother   . Cancer Maternal Grandfather     Social History   Tobacco Use  . Smoking status: Never Smoker  . Smokeless tobacco: Never Used  Substance Use Topics  . Alcohol use: No    Alcohol/week: 0.0 standard drinks  . Drug use: No     Home Medications Prior to Admission medications   Medication Sig Start Date End Date Taking? Authorizing Provider  5-Hydroxytryptophan (5-HTP PO) Take 1 capsule by mouth daily.     [provider]  amphetamine-dextroamphetamine (ADDERALL) 20 MG tablet 1 po q am, 1/2 at noon. 05/30/19   Mozingo, Berdie Ogren, NP  ASHWAGANDHA PO Take 1 capsule by mouth daily.     [provider]  B-COMPLEX-C PO Take 1 capsule by mouth daily.     [provider]  buPROPion (WELLBUTRIN XL) 150 MG 24 hr tablet TAKE THREE TABLETS EVERY MORNING. 04/03/19   Mozingo, Berdie Ogren, NP  cetirizine (ZYRTEC) 10 MG tablet Take 10 mg by mouth daily.    [provider]  etonogestrel (NEXPLANON) 68 MG IMPL implant 1 each by Subdermal route once. March 2017    [provider]  famotidine (PEPCID) 20 MG tablet Take 1 tablet (20 mg total) by mouth 2 (two) times daily. 10/26/17 10/26/18  Doreatha Lew, MD  Ginkgo Biloba Extract 60 MG CAPS Take 1 capsule by mouth daily.     [provider]  insulin aspart (NOVOLOG FLEXPEN) 100 UNIT/ML FlexPen Inject 5-20 Units into the skin 3 (three) times daily with meals. Sliding scale    [provider]  Insulin Degludec (TRESIBA FLEXTOUCH) 100 UNIT/ML SOPN Inject 55 Units into the skin daily.  12/14/14   [provider]  ondansetron (ZOFRAN-ODT) 4 MG disintegrating tablet Take 4 mg by mouth every 8 (eight) hours as needed for nausea or vomiting.  10/24/17   [provider]  oxyCODONE-acetaminophen (PERCOCET) 7.5-325 MG tablet Take 1 tablet by mouth every 6 (six) hours as needed for severe pain. for pain 10/20/17   [provider]  Turmeric (RA TURMERIC) 500 MG CAPS Take 500 mg by mouth daily.     [provider]  venlafaxine XR (EFFEXOR-XR) 75 MG 24 hr capsule Take 150 mg by mouth daily with breakfast.    [provider]    Allergies    Aloe, Duloxetine, Gabapentin, Olive oil,  Lyrica [pregabalin], Tapentadol, and Zonisamide  Review of Systems   Review of Systems  Constitutional: Negative for fever.  Gastrointestinal: Positive for abdominal pain.  Genitourinary: Negative for dysuria and vaginal bleeding.  All other systems reviewed and are negative.   Physical Exam Updated Vital Signs BP (!) 145/85   Pulse 75   Temp 98.2 F (36.8 C) (Oral)   Resp 17   Ht 1.626 m (5\' 4" )   Wt 91.7 kg   SpO2 100%   BMI 34.70 kg/m   Physical Exam CONSTITUTIONAL: Well developed/well nourished, uncomfortable appearing, actively vomiting  HEAD: Normocephalic/atraumatic EYES: EOMI/PERRL, no icterus ENMT: Mucous membranes moist NECK: supple no meningeal signs  SPINE/BACK:entire spine nontender CV: S1/S2 noted, no murmurs/rubs/gallops noted LUNGS: Lungs are clear to auscultation bilaterally, no apparent distress ABDOMEN: soft, moderate epigastric and right upper quadrant tenderness, no rebound or guarding, bowel sounds noted throughout abdomen GU:no cva tenderness NEURO: Pt is awake/alert/appropriate, moves all extremitiesx4.  No facial droop.  EXTREMITIES: pulses normal/equal, full ROM SKIN: warm, color normal PSYCH: Anxious  ED Results / Procedures / Treatments   Labs (all labs ordered are listed, but only abnormal results are displayed) Labs Reviewed  COMPREHENSIVE METABOLIC PANEL - Abnormal; Notable for the following components:      Result Value   Glucose, Bld 109 (*)    AST 524 (*)    ALT 188 (*)    All other components within normal limits  URINALYSIS, ROUTINE W REFLEX MICROSCOPIC - Abnormal; Notable for the following components:   Color, Urine AMBER (*)    APPearance HAZY (*)    Specific Gravity, Urine 1.034 (*)    Bilirubin Urine SMALL (*)    Protein, ur 30 (*)    Bacteria, UA FEW (*)    All other components within normal limits  CBG MONITORING, ED - Abnormal; Notable for the following components:   Glucose-Capillary 104 (*)    All other components  within normal limits  CBG MONITORING, ED - Abnormal; Notable for the following components:   Glucose-Capillary 119 (*)    All other components within normal limits  RESPIRATORY PANEL BY RT PCR (FLU A&B, COVID)  LIPASE, BLOOD  CBC  HEMOGLOBIN A1C  I-STAT BETA HCG BLOOD, ED ([facility], [facility], [facility] ONLY)  CBG MONITORING, ED    EKG EKG Interpretation  Date/Time:  Tuesday June 26 2019 23:47:15 EDT Ventricular Rate:  101 PR Interval:  144 QRS Duration: 86 QT Interval:  354 QTC Calculation: 459 R Axis:   68 Text Interpretation: Sinus tachycardia Otherwise normal ECG Confirmed by Zadie Rhine ([id number]) on 06/27/2019 3:23:16 AM   Radiology DG Chest Port 1 View  Result Date: 06/27/2019 CLINICAL DATA:  Severe abdominal pain. Nausea and vomiting. Shortness of breath. EXAM: PORTABLE CHEST 1 VIEW COMPARISON:  One-view chest x-ray 04/03/2019 FINDINGS: The heart size is normal, exaggerated by low lung volumes. Mild interstitial prominence is present the infrahilar regions bilaterally. No significant airspace consolidation is present. No effusions are present. IMPRESSION: Low lung volumes and mild interstitial prominence, likely atelectasis. Infection is considered less likely. Electronically Signed   By: Marin Roberts M.D.   On: 06/27/2019 04:13   US Abdomen Limited RUQ  Result Date: 06/27/2019 CLINICAL DATA:  Right upper quadrant pain. EXAM: ULTRASOUND ABDOMEN LIMITED RIGHT UPPER QUADRANT COMPARISON:  Abdominal ultrasound 04/03/19.  MRCP 04/03/2019. FINDINGS: Gallbladder: Mobile gallstones are again seen. Focal wall thickening is present. No sonographic Eulah Pont sign is reported. Common bile duct: Diameter: 5 mm, within normal limits Liver: No focal lesion identified. Within normal limits in parenchymal echogenicity. Portal vein is patent on color Doppler imaging with normal direction of blood flow towards the liver. Other: None. IMPRESSION: 1. Cholelithiasis without evidence for cholecystitis.  Electronically Signed   By: Marin Roberts M.D.   On: 06/27/2019 04:40    Procedures Procedures    Medications Ordered in ED Medications  sodium chloride flush (NS) 0.9 % injection 3 mL (3 mLs Intravenous Given 06/27/19 0353)  ondansetron (ZOFRAN-ODT) disintegrating tablet 4 mg (4 mg Oral Given 06/27/19 0152)  fentaNYL (SUBLIMAZE) injection 100 mcg (100 mcg Intravenous Given 06/27/19 0353)  ondansetron (ZOFRAN) injection 4 mg (4 mg Intravenous Given 06/27/19  0353)  ondansetron (ZOFRAN) injection 4 mg (4 mg Intravenous Given 06/27/19 0431)  HYDROmorphone (DILAUDID) injection 1 mg (1 mg Intravenous Given 06/27/19 0545)  ondansetron (ZOFRAN) injection 4 mg (4 mg Intravenous Given 06/27/19 0545)    ED Course  I have reviewed the triage vital signs and the nursing notes.  Pertinent labs & imaging results that were available during my care of the patient were reviewed by me and considered in my medical decision making (see chart for details).    MDM Rules/Calculators/A&P                       This patient presents to the ED for concern of abdominal pain and vomiting, this involves an extensive number of treatment options, and is a complaint that carries with it a high risk of complications and morbidity.  The differential diagnosis includes cholecystitis, peptic ulcer disease, bowel obstruction   Lab Tests:   I Ordered, reviewed, and interpreted labs, which included complete blood count, metabolic panel, liver function test, lipase, pregnancy test  Medicines ordered:   I ordered medication fentanyl and Zofran for pain and nausea  Imaging Studies ordered:   I ordered imaging studies which included x-ray and abdominal ultrasound  I independently visualized and interpreted imaging which showed cholelithiasis  Additional history obtained:   Additional history obtained from aunt Burna Mortimer  Previous records obtained and reviewed history of cholelithiasis  Consultations  Obtained:   I consulted general surgery Dr. Dwain Sarna and discussed lab and imaging findings  Reevaluation:  After the interventions stated above, I reevaluated the patient and found she reports continued abdominal pain and vomiting  6:51 AM Discussed with Dr. Dwain Sarna with general surgery.  Patient will be admitted to surgery service for planned cholecystectomy due to intractable pain and vomiting in the setting of cholelithiasis We will update family via phone Final Clinical Impression(s) / ED Diagnoses Final diagnoses:  RUQ pain  Calculus of gallbladder without cholecystitis without obstruction    Rx / DC Orders ED Discharge Orders    None       Zadie Rhine, MD 06/27/19 [id number]

## 2019-06-27 NOTE — H&P (Signed)
Theresa [name] is an 29 y.o. female.   Chief Complaint: ab pain HPI: 57 yof admitted in January for ab pain and elevated lfts.  Noted to have gallstones at that time but thought not to be related to symptoms.  She has seen GI at [facility] in [place] since then and was told she had fatty liver as source of lft elevation.  These were getting better during admission and included a tb of 2.6.  She has done fine since then until yesterday developed epigastric and ruq pain that is unrelenting.  Nothing she was doing was making it better.  No fevers. No urinary symptoms.  She had [place] that shows gallstones again. Transaminases are elevated still.  Wbc normal.  I was asked to see her  Past Medical History:  Diagnosis Date  . Fibromyalgia   . Headache   . Insomnia   . Insulin dependent type 1 diabetes mellitus (HCC)   . 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  Recommend follow-up Pap smear/HPV at next annual exam.  . Peripheral neuropathy   . Seasonal allergies   . 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 CATARACT EXTRACTION W/ INTRAOCULAR LENS  IMPLANT, BILATERAL Bilateral 05/2014 - 06/2014  . 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 EYE SURGERY    . MASS EXCISION Right 07/10/2013   Procedure: MINOR EXCISION RIGHT NECK CYST;  Surgeon: Drema Halon, MD;  Location: [facility];  Service: ENT;  Laterality: Right;  . Nexplanon  06/27/2018  . PARS PLANA REPAIR OF RETINAL DEATACHMENT Right 11/04/2015  . PARS PLANA VITRECTOMY Right 11/04/2015   Procedure: PARS PLANA VITRECTOMY WITH 25 GAUGE TO REPAIR A COMPLEX TRACTION RETINAL DETACHMENT;  Surgeon: Sherrie George, MD;  Location: [facility] OR;  Service: Ophthalmology;  Laterality: Right;  . WISDOM TOOTH EXTRACTION  09/2015     Family History  Problem Relation Age of Onset  . Diabetes Mother   . Migraines Mother   . Hypertension Mother   . CVA Mother   . Breast cancer Maternal Grandmother   . Thyroid disease Maternal Grandmother   . Cancer Maternal Grandfather    Social History:  reports that she has never smoked. She has never used smokeless tobacco. She reports that she does not drink alcohol or use drugs.  Allergies:  Allergies  Allergen Reactions  . Aloe Hives  . Duloxetine Other (See Comments)    RESTLESSNESS URGES TO MOVE  . Gabapentin Swelling    SWELLING REACTION OF FEET  . Olive Oil Hives  . Lyrica [Pregabalin] Other (See Comments)    Heart palpitations Fainting  . Tapentadol Swelling  . Zonisamide Other (See Comments)    WORSENING PAIN   meds reviewed  Results for orders placed or performed during the hospital encounter of 06/26/19 (from the past 48 hour(s))  POC CBG, ED     Status: Abnormal   Collection Time: 06/26/19 11:49 PM  Result Value Ref Range   Glucose-Capillary 104 (H) 70 - 99 mg/dL    Comment: Glucose reference range applies only to samples taken after fasting for at least 8 hours.  CBG monitoring, ED     Status: Abnormal   Collection Time: 06/27/19 12:35 AM  Result Value Ref Range   Glucose-Capillary 119 (H)  70 - 99 mg/dL    Comment: Glucose reference range applies only to samples taken after fasting for at least 8 hours.  Urinalysis, Routine w reflex microscopic     Status: Abnormal   Collection Time: 06/27/19  1:01 AM  Result Value Ref Range   Color, Urine AMBER (A) YELLOW    Comment: BIOCHEMICALS MAY BE AFFECTED BY COLOR   APPearance HAZY (A) CLEAR   Specific Gravity, Urine 1.034 (H) 1.005 - 1.030   pH 5.0 5.0 - 8.0   Glucose, UA NEGATIVE NEGATIVE mg/dL   Hgb urine dipstick NEGATIVE NEGATIVE   Bilirubin Urine SMALL (A) NEGATIVE   Ketones, ur NEGATIVE NEGATIVE mg/dL   Protein, ur 30 (A) NEGATIVE mg/dL   Nitrite NEGATIVE NEGATIVE   Leukocytes,Ua NEGATIVE  NEGATIVE   RBC / HPF 6-10 0 - 5 RBC/hpf   WBC, UA 0-5 0 - 5 WBC/hpf   Bacteria, UA FEW (A) NONE SEEN   Squamous Epithelial / LPF 0-5 0 - 5   Mucus PRESENT    Hyaline Casts, UA PRESENT    Ca Oxalate Crys, UA PRESENT     Comment: Performed at [facility], [address], [place], [place] [address]  Lipase, blood     Status: None   Collection Time: 06/27/19  1:05 AM  Result Value Ref Range   Lipase 18 11 - 51 U/L    Comment: Performed at [facility], [address], [place], [place] [address]  Comprehensive metabolic panel     Status: Abnormal   Collection Time: 06/27/19  1:05 AM  Result Value Ref Range   Sodium 138 135 - 145 mmol/L   Potassium 3.5 3.5 - 5.1 mmol/L   Chloride 103 98 - 111 mmol/L   CO2 23 22 - 32 mmol/L   Glucose, Bld 109 (H) 70 - 99 mg/dL    Comment: Glucose reference range applies only to samples taken after fasting for at least 8 hours.   BUN 9 6 - 20 mg/dL   Creatinine, Ser 5.18 0.44 - 1.00 mg/dL   Calcium 9.6 8.9 - 84.1 mg/dL   Total Protein 7.4 6.5 - 8.1 g/dL   Albumin 3.7 3.5 - 5.0 g/dL   AST 660 (H) 15 - 41 U/L   ALT 188 (H) 0 - 44 U/L   Alkaline Phosphatase 109 38 - 126 U/L   Total Bilirubin 0.8 0.3 - 1.2 mg/dL   GFR calc non Af Amer >60 >60 mL/min   GFR calc Af Amer >60 >60 mL/min   Anion gap 12 5 - 15    Comment: Performed at [facility], [address] [place], [place] [address]  CBC     Status: None   Collection Time: 06/27/19  1:05 AM  Result Value Ref Range   WBC 10.0 4.0 - 10.5 K/uL   RBC 4.09 3.87 - 5.11 MIL/uL   Hemoglobin 12.5 12.0 - 15.0 g/dL   HCT 01.0 93.2 - 35.5 %   MCV 92.4 80.0 - 100.0 fL   MCH 30.6 26.0 - 34.0 pg   MCHC 33.1 30.0 - 36.0 g/dL   RDW 73.2 20.2 - 54.2 %   Platelets 322 150 - 400 K/uL   nRBC 0.0 0.0 - 0.2 %    Comment: Performed at [facility], [address] [place], [place] [address]  I-Stat beta hCG blood, ED     Status: None   Collection Time: 06/27/19  1:10 AM  Result Value Ref  Range   I-stat hCG, quantitative <5.0 <5 mIU/mL   Comment 3            Comment:   GEST. AGE      CONC.  (mIU/mL)   <=1 WEEK        5 - 50     2 WEEKS       50 - 500     3 WEEKS       100 - 10,000     4 WEEKS     1,000 - 30,000        FEMALE AND NON-PREGNANT FEMALE:     LESS THAN 5 mIU/mL    DG Chest Port 1 View  Result Date: 06/27/2019 CLINICAL DATA:  Severe abdominal pain. Nausea and vomiting. Shortness of breath. EXAM: PORTABLE CHEST 1 VIEW COMPARISON:  One-view chest x-ray 04/03/2019 FINDINGS: The heart size is normal, exaggerated by low lung volumes. Mild interstitial prominence is present the infrahilar regions bilaterally. No significant airspace consolidation is present. No effusions are present. IMPRESSION: Low lung volumes and mild interstitial prominence, likely atelectasis. Infection is considered less likely. Electronically Signed   By: San Morelle M.D.   On: 06/27/2019 04:13   US Abdomen Limited RUQ  Result Date: 06/27/2019 CLINICAL DATA:  Right upper quadrant pain. EXAM: ULTRASOUND ABDOMEN LIMITED RIGHT UPPER QUADRANT COMPARISON:  Abdominal ultrasound 04/03/19.  MRCP 04/03/2019. FINDINGS: Gallbladder: Mobile gallstones are again seen. Focal wall thickening is present. No sonographic Percell Miller sign is reported. Common bile duct: Diameter: 5 mm, within normal limits Liver: No focal lesion identified. Within normal limits in parenchymal echogenicity. Portal vein is patent on color Doppler imaging with normal direction of blood flow towards the liver. Other: None. IMPRESSION: 1. Cholelithiasis without evidence for cholecystitis. Electronically Signed   By: San Morelle M.D.   On: 06/27/2019 04:40    Review of Systems  Constitutional: Negative for fever.  Respiratory: Negative for shortness of breath.   Cardiovascular: Positive for chest pain.  Gastrointestinal: Positive for abdominal pain, nausea and vomiting. Negative for constipation.  Genitourinary: Negative for  dysuria.  All other systems reviewed and are negative.   Blood pressure (!) 161/89, pulse 79, temperature 98.2 F (36.8 C), temperature source Oral, resp. rate 12, height 5\' 4"  (1.626 m), weight 91.7 kg, SpO2 100 %. Physical Exam  Vitals reviewed. Constitutional: She is oriented to person, place, and time. She appears well-developed and well-nourished.  HENT:  Right Ear: External ear normal.  Left Ear: External ear normal.  Nose: Nose normal.  Mouth/Throat: Oropharynx is clear and moist.  Eyes: EOM are normal. No scleral icterus.  Neck: No tracheal deviation present. No thyromegaly present.  Cardiovascular: Normal rate, regular rhythm, normal heart sounds and intact distal pulses.  Respiratory: Effort normal and breath sounds normal. She has no wheezes.  GI: Soft. Bowel sounds are normal. She exhibits no distension. There is abdominal tenderness in the right upper quadrant and epigastric area.  Musculoskeletal:        General: No tenderness or deformity.     Cervical back: Neck supple.  Lymphadenopathy:    She has no cervical adenopathy.  Neurological: She is alert and oriented to person, place, and time. She has normal strength.  Skin: Skin is warm. No rash noted. She is diaphoretic.  Psychiatric: Judgment and thought content normal.     Assessment/Plan Intractable biliary colic Elevated LFTs Fibromyalgia Type I DM  -I do think she may very well have  symptoms related to gallbladder although her pain is certainly out of proportion from what it appears on [place].   Not sure if she didn't pass a stone in January. -I think just needs lap chole to ensure this not source of pain and think will help to some degree -I recommended admission, lap chole later today after covid swab -she is agreeable   Emelia Loron, MD 06/27/2019, 6:07 AM

## 2019-06-28 ENCOUNTER — Other Ambulatory Visit: Payer: Self-pay

## 2019-06-28 DIAGNOSIS — M797 Fibromyalgia: Secondary | ICD-10-CM | POA: Diagnosis present

## 2019-06-28 HISTORY — DX: Fibromyalgia: M79.7

## 2019-06-28 LAB — COMPREHENSIVE METABOLIC PANEL
ALT: 728 U/L — ABNORMAL HIGH (ref 0–44)
AST: 621 U/L — ABNORMAL HIGH (ref 15–41)
Albumin: 2.8 g/dL — ABNORMAL LOW (ref 3.5–5.0)
Alkaline Phosphatase: 252 U/L — ABNORMAL HIGH (ref 38–126)
Anion gap: 6 (ref 5–15)
BUN: 5 mg/dL — ABNORMAL LOW (ref 6–20)
CO2: 25 mmol/L (ref 22–32)
Calcium: 7.9 mg/dL — ABNORMAL LOW (ref 8.9–10.3)
Chloride: 104 mmol/L (ref 98–111)
Creatinine, Ser: 0.75 mg/dL (ref 0.44–1.00)
GFR calc Af Amer: >60 mL/min (ref >60)
GFR calc non Af Amer: >60 mL/min (ref >60)
Glucose, Bld: 197 mg/dL — ABNORMAL HIGH (ref 70–99)
Potassium: 3.6 mmol/L (ref 3.5–5.1)
Sodium: 135 mmol/L (ref 135–145)
Total Bilirubin: 3.3 mg/dL — ABNORMAL HIGH (ref 0.3–1.2)
Total Protein: 5.7 g/dL — ABNORMAL LOW (ref 6.5–8.1)

## 2019-06-28 LAB — GLUCOSE, CAPILLARY
Glucose-Capillary: 152 mg/dL — ABNORMAL HIGH (ref 70–99)
Glucose-Capillary: 154 mg/dL — ABNORMAL HIGH (ref 70–99)
Glucose-Capillary: 157 mg/dL — ABNORMAL HIGH (ref 70–99)
Glucose-Capillary: 163 mg/dL — ABNORMAL HIGH (ref 70–99)
Glucose-Capillary: 189 mg/dL — ABNORMAL HIGH (ref 70–99)
Glucose-Capillary: 202 mg/dL — ABNORMAL HIGH (ref 70–99)
Glucose-Capillary: 253 mg/dL — ABNORMAL HIGH (ref 70–99)

## 2019-06-28 MED ORDER — PROMETHAZINE HCL 25 MG/ML IJ SOLN
6.2500 mg | Freq: Four times a day (QID) | INTRAMUSCULAR | Status: DC | PRN
  Administered 2019-06-28 – 2019-06-30 (×4): 6.25 mg via INTRAVENOUS
  Filled 2019-06-28 (×4): qty 1

## 2019-06-28 MED ORDER — IBUPROFEN 600 MG PO TABS
600.0000 mg | ORAL_TABLET | Freq: Four times a day (QID) | ORAL | Status: DC | PRN
  Administered 2019-06-28 – 2019-06-30 (×3): 600 mg via ORAL
  Filled 2019-06-28 (×3): qty 1

## 2019-06-28 MED ORDER — OXYCODONE-ACETAMINOPHEN 7.5-325 MG PO TABS
1.0000 | ORAL_TABLET | Freq: Four times a day (QID) | ORAL | Status: DC | PRN
  Administered 2019-06-30: 15:00:00 1 via ORAL
  Filled 2019-06-28 (×2): qty 1

## 2019-06-28 MED ORDER — TRAMADOL HCL 50 MG PO TABS
50.0000 mg | ORAL_TABLET | Freq: Four times a day (QID) | ORAL | Status: DC
  Administered 2019-06-28 – 2019-06-29 (×4): 50 mg via ORAL
  Filled 2019-06-28 (×7): qty 1

## 2019-06-28 MED ORDER — BUPROPION HCL ER (XL) 150 MG PO TB24
450.0000 mg | ORAL_TABLET | Freq: Every day | ORAL | Status: DC
  Administered 2019-06-28 – 2019-06-30 (×3): 450 mg via ORAL
  Filled 2019-06-28 (×3): qty 3

## 2019-06-28 MED ORDER — AMPHETAMINE-DEXTROAMPHETAMINE 10 MG PO TABS
20.0000 mg | ORAL_TABLET | Freq: Every day | ORAL | Status: DC
  Administered 2019-06-30: 09:00:00 20 mg via ORAL
  Filled 2019-06-28 (×2): qty 2

## 2019-06-28 MED ORDER — ACETAMINOPHEN 500 MG PO TABS
1000.0000 mg | ORAL_TABLET | Freq: Three times a day (TID) | ORAL | Status: DC
  Filled 2019-06-28: qty 2

## 2019-06-28 MED ORDER — OXYCODONE HCL 5 MG PO TABS
10.0000 mg | ORAL_TABLET | ORAL | Status: DC | PRN
  Administered 2019-06-28: 15 mg via ORAL
  Filled 2019-06-28: qty 3

## 2019-06-28 MED ORDER — HYDROMORPHONE HCL 1 MG/ML IJ SOLN
0.5000 mg | INTRAMUSCULAR | Status: DC | PRN
  Administered 2019-06-28 – 2019-06-29 (×3): 0.5 mg via INTRAVENOUS
  Filled 2019-06-28 (×4): qty 1

## 2019-06-28 NOTE — Anesthesia Postprocedure Evaluation (Signed)
Anesthesia Post Note  Patient: Theresa [name]  Procedure(s) Performed: LAPAROSCOPIC CHOLECYSTECTOMY (N/A Abdomen)     Patient location during evaluation: PACU Anesthesia Type: General Level of consciousness: awake and alert Pain management: pain level controlled Vital Signs Assessment: post-procedure vital signs reviewed and stable Respiratory status: spontaneous breathing, nonlabored ventilation, respiratory function stable and patient connected to nasal cannula oxygen Cardiovascular status: blood pressure returned to baseline and stable Postop Assessment: no apparent nausea or vomiting Anesthetic complications: no    Last Vitals:  Vitals:   06/28/19 0201 06/28/19 0413  BP: 139/81 118/78  Pulse: (!) 103 (!) 104  Resp: 18 18  Temp: 37.3 C 37 C  SpO2: 100% 99%    Last Pain:  Vitals:   06/28/19 0633  TempSrc:   PainSc: Asleep                 Kaliann Coryell S

## 2019-06-28 NOTE — Discharge Summary (Addendum)
Physician Discharge Summary  Patient ID: Theresa [name] MRN: [id number] DOB/AGE: Jul 14, 1990 29 y.o.  Admit date: 06/26/2019 Discharge date: 06/30/2019  Admission Diagnoses:  Intractable biliary colic Elevated LFTs Fibromyalgia/chronic pain - on chronic pain contract Type 1 diabetes ADD  Discharge Diagnoses:  Same   Active Problems:   Diabetes (HCC)   Gallstones   Fibromyalgia   PROCEDURES: Laparoscopic cholecystectomy 06/27/2019, Dr. Gloris Ham Course: 2 yof admitted in January for ab pain and elevated lfts.  Noted to have gallstones at that time but thought not to be related to symptoms.  She has seen GI at [facility] in [place] since then and was told she had fatty liver as source of lft elevation.  These were getting better during admission and included a tb of 2.6.  She has done fine since then until yesterday developed epigastric and ruq pain that is unrelenting.  Nothing she was doing was making it better.  No fevers. No urinary symptoms.  She had [place] that shows gallstones again. Transaminases are elevated still.  Wbc normal.    She was admitted by Dr. Dwain Sarna and seen by Dr. Violeta Gelinas that a.m.  She was taken to the operating room and underwent laparoscopic cholecystectomy without issue.  Postop she is done well.  She has issues with chronic pain and her pain medications being adjusted.  We have attempted to contact [facility] medical and Dr. Mikael Spray, but the answering service will only let us leave a message and we have received no call back so far. She continues to have issues with nausea postop.  LFTs were also elevated.  We obtained a HIDA scan which was negative.  We follow this with a hepatitis panel.  By 06/30/19 LFTs were improving.  Her nausea is improving.  She is able to tolerate full liquids and would like to go home.  We are going to resume her preadmission medicines as before.  I recommended she follow-up with her primary care at [facility]  and possibly with her GI doctor to further evaluate her elevated LFTs.  She is on multiple medications for her illnesses, she has had issues with elevated LFTs in the past.  I am going to give her some Phenergan tablets for discharge for her nausea.  I gave her 15 tramadol for additional pain relief, she is also going to be able to use ibuprofen over-the-counter for pain relief.  CMP Latest Ref Rng & Units 06/29/2019 06/29/2019 06/28/2019  Glucose 70 - 99 mg/dL 381(O) 175(Z) 025(E)  BUN 6 - 20 mg/dL <5(I) <7(P) 5(L)  Creatinine 0.44 - 1.00 mg/dL 8.24 2.35 3.61  Sodium 135 - 145 mmol/L 137 137 135  Potassium 3.5 - 5.1 mmol/L 3.6 4.1 3.6  Chloride 98 - 111 mmol/L 104 106 104  CO2 22 - 32 mmol/L 25 23 25   Calcium 8.9 - 10.3 mg/dL 8.2(L) 8.1(L) 7.9(L)  Total Protein 6.5 - 8.1 g/dL ) 4.4(R) 5.7(L)  Total Bilirubin 0.3 - 1.2 mg/dL 1.5(Q) 2.6(H) 3.3(H)  Alkaline Phos 38 - 126 U/L 271(H) 246(H) 252(H)  AST 15 - 41 U/L 201(H) 371(H) 621(H)  ALT 0 - 44 U/L 512(H) 622(H) 728(H)    06/27/19 0105   Sodium 135 - 145 mmol/L 138   Potassium 3.5 - 5.1 mmol/L 3.5   Chloride 98 - 111 mmol/L 103   CO2 22 - 32 mmol/L 23   Glucose, Bld 70 - 99 mg/dL 06/29/19    Comment: Glucose reference range applies only  to samples taken after fasting for at least 8 hours.  BUN 6 - 20 mg/dL 9   Creatinine, Ser 6.65 - 1.00 mg/dL 9.93   Calcium 8.9 - 57.0 mg/dL 9.6   Total Protein 6.5 - 8.1 g/dL 7.4   Albumin 3.5 - 5.0 g/dL 3.7   AST 15 - 41 U/L [id number]    ALT 0 - 44 U/L 188High    Alkaline Phosphatase 38 - 126 U/L 109   Total Bilirubin 0.3 - 1.2 mg/dL 0.8   GFR calc non Af Amer >60 mL/min >60   GFR calc Af Amer >60 mL/min >60   Anion gap 5 - 15 12       HIDA scan 06/29/2019 negative  CBC Latest Ref Rng & Units 06/30/2019 06/29/2019 06/27/2019  WBC 4.0 - 10.5 K/uL 6.6 6.5 10.0  Hemoglobin 12.0 - 15.0 g/dL 10.6(L) 10.5(L) 12.5  Hematocrit 36.0 - 46.0 % 32.6(L) 32.4(L) 37.8  Platelets 150 - 400 K/uL 240 236 322     Condition on discharge: Improved   Disposition: Discharge disposition: 01-Home or Self Care     Home   Allergies as of 06/30/2019      Reactions   Tapentadol Swelling   Aloe Hives   Duloxetine Other (See Comments)   RESTLESSNESS URGES TO MOVE   Gabapentin Swelling   SWELLING REACTION OF FEET   Lyrica [pregabalin] Other (See Comments)   Heart palpitations Fainting   Olive Oil Hives   Zonisamide Other (See Comments)   WORSENING PAIN      Medication List    TAKE these medications   5-HTP PO Take 1 capsule by mouth daily.   amphetamine-dextroamphetamine 20 MG tablet Commonly known as: Adderall 1 po q am, 1/2 at noon. What changed:   how much to take  how to take this  when to take this  additional instructions   ASHWAGANDHA PO Take 1 capsule by mouth daily.   B-COMPLEX-C PO Take 1 capsule by mouth daily.   buPROPion 150 MG 24 hr tablet Commonly known as: WELLBUTRIN XL TAKE THREE TABLETS EVERY MORNING. What changed: See the new instructions.   cetirizine 10 MG tablet Commonly known as: ZYRTEC Take 10 mg by mouth daily.   Clindamycin Phosphate foam Apply 1 application topically See admin instructions. Apply to chest, back, and shoulders daily.   clobetasol ointment 0.05 % Commonly known as: TEMOVATE Apply 1 application topically 2 (two) times daily.   famotidine 20 MG tablet Commonly known as: PEPCID Take 1 tablet (20 mg total) by mouth 2 (two) times daily.   famotidine 40 MG tablet Commonly known as: PEPCID Take 20 mg by mouth 2 (two) times daily.   ferrous sulfate 324 (65 Fe) MG Tbec Take 1 tablet by mouth daily.   Ginkgo Biloba Extract 60 MG Caps Take 1 capsule by mouth daily.   hydroquinone 4 % cream Apply 1 application topically at bedtime.   ibuprofen 200 MG tablet Commonly known as: ADVIL You can drink 2 to 3 tablets every 6 hours as needed for pain.  You can buy this over-the-counter at any drugstore.   Nexplanon 68 MG  Impl implant Generic drug: etonogestrel 1 each by Subdermal route once. March 2017   NovoLOG FlexPen 100 UNIT/ML FlexPen Generic drug: insulin aspart Inject 5-20 Units into the skin See admin instructions. Sliding scale starting at 7 units and then 1 additional unit per 50mg  above blood sugar reading - per patient.   ondansetron 4 MG disintegrating tablet  Commonly known as: ZOFRAN-ODT Take 4 mg by mouth every 8 (eight) hours as needed for nausea or vomiting.   oxyCODONE-acetaminophen 7.5-325 MG tablet Commonly known as: PERCOCET Take 1 tablet by mouth every 6 (six) hours as needed for severe pain. for pain   promethazine 12.5 MG tablet Commonly known as: PHENERGAN Take 1 tablet (12.5 mg total) by mouth every 6 (six) hours as needed for nausea or vomiting.   RA Turmeric 500 MG Caps Generic drug: Turmeric Take 500 mg by mouth daily.   traMADol 50 MG tablet Commonly known as: ULTRAM Take 1 tablet (50 mg total) by mouth every 6 (six) hours as needed.   Tyler Aas FlexTouch 100 UNIT/ML FlexTouch Pen Generic drug: insulin degludec Inject 50 Units into the skin daily.   tretinoin 0.05 % cream Commonly known as: RETIN-A Apply 1 application topically See admin instructions. Apply to shoulders and back every night.   venlafaxine XR 75 MG 24 hr capsule Commonly known as: EFFEXOR-XR Take 150 mg by mouth at bedtime.   Vitamin D (Ergocalciferol) 1.25 MG (50000 UNIT) Caps capsule Commonly known as: DRISDOL Take 50,000 Units by mouth once a week.      Follow-up Information    [facility] [place] [facility].   Specialty: General Surgery Why: our office should call you next week with follow up appointment. If they do not you can call and ask for a [facility] appointment.   Contact information: [address] [place] [address] [phone]        Elenore Paddy, FNP Follow up.   Specialty: Family Medicine Why: call and have them review all your medicines and your  elevated liver function studies next week.  Call them for all medicine refills. Contact information: [place] [address] [phone]           Signed: Earnstine Regal 06/30/2019, 1:03 PM

## 2019-06-28 NOTE — Progress Notes (Signed)
1 Day Post-Op   Subjective/Chief Complaint: Has tolerated some liquids  Reports Oxy is not strong enough because she has taken this for years for fibromyalgia   Objective: Vital signs in last 24 hours: Temp:  [98.1 F (36.7 C)-99.4 F (37.4 C)] 98.6 F (37 C) (04/22 0413) Pulse Rate:  [95-108] 104 (04/22 0413) Resp:  [11-30] 18 (04/22 0413) BP: (117-148)/(68-89) 118/78 (04/22 0413) SpO2:  [98 %-100 %] 99 % (04/22 0413)    Intake/Output from previous day: 04/21 0701 - 04/22 0700 In: 4039 [I.V.:3939; IV Piggyback:100] Out: 700 [Urine:700] Intake/Output this shift: No intake/output data recorded.  General appearance: alert and cooperative Resp: clear to auscultation bilaterally GI: soft, incisions CDI  Lab Results:  Recent Labs    06/27/19 0105  WBC 10.0  HGB 12.5  HCT 37.8  PLT 322   BMET Recent Labs    06/27/19 0105  NA 138  K 3.5  CL 103  CO2 23  GLUCOSE 109*  BUN 9  CREATININE 0.77  CALCIUM 9.6   PT/INR No results for input(s): LABPROT, INR in the last 72 hours. ABG No results for input(s): PHART, HCO3 in the last 72 hours.  Invalid input(s): PCO2, PO2  Studies/Results: DG Chest Port 1 View  Result Date: 06/27/2019 CLINICAL DATA:  Severe abdominal pain. Nausea and vomiting. Shortness of breath. EXAM: PORTABLE CHEST 1 VIEW COMPARISON:  One-view chest x-ray 04/03/2019 FINDINGS: The heart size is normal, exaggerated by low lung volumes. Mild interstitial prominence is present the infrahilar regions bilaterally. No significant airspace consolidation is present. No effusions are present. IMPRESSION: Low lung volumes and mild interstitial prominence, likely atelectasis. Infection is considered less likely. Electronically Signed   By: Marin Roberts M.D.   On: 06/27/2019 04:13   US Abdomen Limited RUQ  Result Date: 06/27/2019 CLINICAL DATA:  Right upper quadrant pain. EXAM: ULTRASOUND ABDOMEN LIMITED RIGHT UPPER QUADRANT COMPARISON:  Abdominal  ultrasound 04/03/19.  MRCP 04/03/2019. FINDINGS: Gallbladder: Mobile gallstones are again seen. Focal wall thickening is present. No sonographic Eulah Pont sign is reported. Common bile duct: Diameter: 5 mm, within normal limits Liver: No focal lesion identified. Within normal limits in parenchymal echogenicity. Portal vein is patent on color Doppler imaging with normal direction of blood flow towards the liver. Other: None. IMPRESSION: 1. Cholelithiasis without evidence for cholecystitis. Electronically Signed   By: Marin Roberts M.D.   On: 06/27/2019 04:40    Anti-infectives: Anti-infectives (From admission, onward)   Start     Dose/Rate Route Frequency Ordered Stop   06/27/19 0615  cefTRIAXone (ROCEPHIN) 2 g in sodium chloride 0.9 % 100 mL IVPB  Status:  Discontinued     2 g 200 mL/hr over 30 Minutes Intravenous Every 24 hours 06/27/19 0973 06/27/19 1618      Assessment/Plan: Cholecystitis - S/P lap chole 4/21 by Dr. Janee Morn, advance diet DM1 - SSI VTE - Lovenox Dispo - increase oxy and schedule Ultram, check this PM for D/C  LOS: 0 days    Theresa [name] 06/28/2019

## 2019-06-28 NOTE — Progress Notes (Addendum)
Spoke to patient on the phone. States that she was diagnosed with diabetes at 29 years old. Has been on Lantus in the past, but more recently on Tresiba 55 units daily and Novolog TID per sliding scale.  States that she last saw her PCP earlier this year. She checks blood sugars at home about 6-7 times per day. Discussed Hgba1C of 10.3%. She said that is what it has been running recently.   Patient states that if she were at home and not eating like she is doing at the hospital right now, she would only take 1/2 of Tresiba dose and no Novolog. She is very concerned about blood sugars going too low. Discussed the insulin regimen that she on at the hospital.   Will continue to monitor blood sugars while in the hospital.   Smith Mince RN BSN CDE Diabetes Coordinator Pager: [phone]  8am-5pm

## 2019-06-28 NOTE — Progress Notes (Addendum)
Pt noted to be nauseous all night but to emesis. PRN zofran given. Pt eventually tolerates clear liquid at this time. Pt states she wants to try to eat solid food for breakfast.   0645, pt states she can not tolerate solids yet because of nausea. Will advance to full liquid first.

## 2019-06-28 NOTE — Progress Notes (Addendum)
Patient continues to complain of abdominal pain and nausea after ambulation.  She is only able to take the clear liquids and says she had nausea after that. I am going to place her on her preadmission Percocet, she has tramadol, ibuprofen, and IV Dilaudid for ongoing pain control.  Continue IV fluids.   LFTs were elevated on admission I will recheck her labs again in the AM.  Also asking for more for nausea, nurse was not aware of any vomiting till I came in with her to see pt again.

## 2019-06-28 NOTE — Progress Notes (Signed)
Inpatient Diabetes Program Recommendations  AACE/ADA: New Consensus Statement on Inpatient Glycemic Control (2015)  Target Ranges:  Prepandial:   less than 140 mg/dL      Peak postprandial:   less than 180 mg/dL (1-2 hours)      Critically ill patients:  140 - 180 mg/dL   Lab Results  Component Value Date   GLUCAP 189 (H) 06/28/2019   HGBA1C 10.3 (H) 06/27/2019    Review of Glycemic Control  Diabetes history: type 1 Outpatient Diabetes medications: Tresiba 55 units daily, Novolog sliding scale TID Current orders for Inpatient glycemic control: Novolog MODERATE correction scale TID  Inpatient Diabetes Program Recommendations:   Patient has Type 1 diabetes. Recommend adding Lantus 20 units daily and decreasing Novolog correction scale to SENSITIVE TID & HS scale. Patient states that she feels that she is getting too much Novolog, but not eating much. She states that she would take 1/2 of Tresiba dose and very little Novolog at home if she was not eating very well.  Will continue to monitor blood sugars while in the hospital.  Smith Mince RN BSN CDE Diabetes Coordinator Pager: [phone]  8am-5pm

## 2019-06-29 ENCOUNTER — Observation Stay (HOSPITAL_COMMUNITY): Payer: BC Managed Care – PPO

## 2019-06-29 LAB — GLUCOSE, CAPILLARY
Glucose-Capillary: 111 mg/dL — ABNORMAL HIGH (ref 70–99)
Glucose-Capillary: 134 mg/dL — ABNORMAL HIGH (ref 70–99)
Glucose-Capillary: 164 mg/dL — ABNORMAL HIGH (ref 70–99)
Glucose-Capillary: 183 mg/dL — ABNORMAL HIGH (ref 70–99)
Glucose-Capillary: 187 mg/dL — ABNORMAL HIGH (ref 70–99)
Glucose-Capillary: 200 mg/dL — ABNORMAL HIGH (ref 70–99)

## 2019-06-29 LAB — CBC
HCT: 32.4 % — ABNORMAL LOW (ref 36.0–46.0)
Hemoglobin: 10.5 g/dL — ABNORMAL LOW (ref 12.0–15.0)
MCH: 30.9 pg (ref 26.0–34.0)
MCHC: 32.4 g/dL (ref 30.0–36.0)
MCV: 95.3 fL (ref 80.0–100.0)
Platelets: 236 10*3/uL (ref 150–400)
RBC: 3.4 MIL/uL — ABNORMAL LOW (ref 3.87–5.11)
RDW: 13.9 % (ref 11.5–15.5)
WBC: 6.5 10*3/uL (ref 4.0–10.5)
nRBC: 0 % (ref 0.0–0.2)

## 2019-06-29 LAB — COMPREHENSIVE METABOLIC PANEL
ALT: 622 U/L — ABNORMAL HIGH (ref 0–44)
ALT: 512 U/L — ABNORMAL HIGH (ref 0–44)
AST: 371 U/L — ABNORMAL HIGH (ref 15–41)
AST: 201 U/L — ABNORMAL HIGH (ref 15–41)
Albumin: 2.7 g/dL — ABNORMAL LOW (ref 3.5–5.0)
Albumin: 2.5 g/dL — ABNORMAL LOW (ref 3.5–5.0)
Alkaline Phosphatase: 246 U/L — ABNORMAL HIGH (ref 38–126)
Alkaline Phosphatase: 271 U/L — ABNORMAL HIGH (ref 38–126)
Anion gap: 8 (ref 5–15)
Anion gap: 8 (ref 5–15)
BUN: <5 mg/dL — ABNORMAL LOW (ref 6–20)
BUN: <5 mg/dL — ABNORMAL LOW (ref 6–20)
CO2: 23 mmol/L (ref 22–32)
CO2: 25 mmol/L (ref 22–32)
Calcium: 8.1 mg/dL — ABNORMAL LOW (ref 8.9–10.3)
Calcium: 8.2 mg/dL — ABNORMAL LOW (ref 8.9–10.3)
Chloride: 106 mmol/L (ref 98–111)
Chloride: 104 mmol/L (ref 98–111)
Creatinine, Ser: 0.53 mg/dL (ref 0.44–1.00)
Creatinine, Ser: 0.61 mg/dL (ref 0.44–1.00)
GFR calc Af Amer: >60 mL/min (ref >60)
GFR calc Af Amer: >60 mL/min (ref >60)
GFR calc non Af Amer: >60 mL/min (ref >60)
GFR calc non Af Amer: >60 mL/min (ref >60)
Glucose, Bld: 235 mg/dL — ABNORMAL HIGH (ref 70–99)
Glucose, Bld: 129 mg/dL — ABNORMAL HIGH (ref 70–99)
Potassium: 4.1 mmol/L (ref 3.5–5.1)
Potassium: 3.6 mmol/L (ref 3.5–5.1)
Sodium: 137 mmol/L (ref 135–145)
Sodium: 137 mmol/L (ref 135–145)
Total Bilirubin: 2.6 mg/dL — ABNORMAL HIGH (ref 0.3–1.2)
Total Bilirubin: 1.4 mg/dL — ABNORMAL HIGH (ref 0.3–1.2)
Total Protein: 5.4 g/dL — ABNORMAL LOW (ref 6.5–8.1)
Total Protein: 5.6 g/dL — ABNORMAL LOW (ref 6.5–8.1)

## 2019-06-29 LAB — HEPATITIS PANEL, ACUTE
HCV Ab: NONREACTIVE
Hep A IgM: NONREACTIVE
Hep B C IgM: NONREACTIVE
Hepatitis B Surface Ag: NONREACTIVE

## 2019-06-29 LAB — LIPASE, BLOOD: Lipase: 14 U/L (ref 11–51)

## 2019-06-29 LAB — SURGICAL PATHOLOGY

## 2019-06-29 MED ORDER — TECHNETIUM TC 99M MEBROFENIN IV KIT
7.3000 | PACK | Freq: Once | INTRAVENOUS | Status: AC | PRN
  Administered 2019-06-29: 7.3 via INTRAVENOUS

## 2019-06-29 MED ORDER — INSULIN ASPART 100 UNIT/ML ~~LOC~~ SOLN: 0.0000 [IU] | Freq: Every day | SUBCUTANEOUS | Status: DC

## 2019-06-29 MED ORDER — INSULIN GLARGINE 100 UNIT/ML ~~LOC~~ SOLN
20.0000 [IU] | Freq: Every day | SUBCUTANEOUS | Status: DC
  Administered 2019-06-29 – 2019-06-30 (×2): 20 [IU] via SUBCUTANEOUS
  Filled 2019-06-29 (×2): qty 0.2

## 2019-06-29 MED ORDER — PROCHLORPERAZINE EDISYLATE 10 MG/2ML IJ SOLN
10.0000 mg | Freq: Four times a day (QID) | INTRAMUSCULAR | Status: DC | PRN
  Administered 2019-06-29 (×2): 10 mg via INTRAVENOUS
  Filled 2019-06-29 (×2): qty 2

## 2019-06-29 MED ORDER — INSULIN ASPART 100 UNIT/ML ~~LOC~~ SOLN
0.0000 [IU] | Freq: Three times a day (TID) | SUBCUTANEOUS | Status: DC
  Administered 2019-06-30: 13:00:00 2 [IU] via SUBCUTANEOUS

## 2019-06-29 NOTE — Progress Notes (Signed)
[facility] Progress Note  2 Days Post-Op  Subjective: Patient has not been able to keep liquids down, still very nauseated. Still having generalized abdominal pain on top of chronic pain. Also reports dark urine.   Review of Systems  Constitutional: Negative for chills and fever.  Respiratory: Negative for shortness of breath.   Cardiovascular: Negative for chest pain.  Gastrointestinal: Positive for abdominal pain, nausea and vomiting.  Genitourinary:       Dark urine      Objective: Vital signs in last 24 hours: Temp:  [98.8 F (37.1 C)-99.2 F (37.3 C)] 99.2 F (37.3 C) (04/23 0527) Pulse Rate:  [96-98] 98 (04/23 0527) Resp:  [16-18] 16 (04/23 0527) BP: (130-146)/(72-93) 130/72 (04/23 0527) SpO2:  [100 %] 100 % (04/23 0527) Last BM Date: 06/26/19  Intake/Output from previous day: 04/22 0701 - 04/23 0700 In: 790.8 [I.V.:790.8] Out: 550 [Urine:550] Intake/Output this shift: No intake/output data recorded.  PE: General: pleasant, in obvious discomfort HEENT: sclera anicteric  Heart: regular, rate, and rhythm.  Normal s1,s2. No obvious murmurs, gallops, or rubs noted.  Palpable radial and pedal pulses bilaterally Lungs: CTAB, no wheezes, rhonchi, or rales noted.  Respiratory effort nonlabored Abd: soft, appropriately ttp, ND, +BS, incisions c/d/i    Lab Results:  Recent Labs    06/27/19 0105 06/29/19 0243  WBC 10.0 6.5  HGB 12.5 10.5*  HCT 37.8 32.4*  PLT 322 236   BMET Recent Labs    06/27/19 0105 06/28/19 1452  NA 138 135  K 3.5 3.6  CL 103 104  CO2 23 25  GLUCOSE 109* 197*  BUN 9 5*  CREATININE 0.77 0.75  CALCIUM 9.6 7.9*   PT/INR No results for input(s): LABPROT, INR in the last 72 hours. CMP     Component Value Date/Time   NA 135 06/28/2019 1452   K 3.6 06/28/2019 1452   CL 104 06/28/2019 1452   CO2 25 06/28/2019 1452   GLUCOSE 197 (H) 06/28/2019 1452   BUN 5 (L) 06/28/2019 1452   CREATININE 0.75 06/28/2019 1452   CREATININE 0.55 12/25/2013 1455   CALCIUM 7.9 (L) 06/28/2019 1452   PROT 5.7 (L) 06/28/2019 1452   ALBUMIN 2.8 (L) 06/28/2019 1452   AST 621 (H) 06/28/2019 1452   ALT 728 (H) 06/28/2019 1452   ALKPHOS 252 (H) 06/28/2019 1452   BILITOT 3.3 (H) 06/28/2019 1452   GFRNONAA >60 06/28/2019 1452   GFRAA >60 06/28/2019 1452   Lipase     Component Value Date/Time   LIPASE 14 06/29/2019 0243       Studies/Results: No results found.  Anti-infectives: Anti-infectives (From admission, onward)   Start     Dose/Rate Route Frequency Ordered Stop   06/27/19 0615  cefTRIAXone (ROCEPHIN) 2 g in sodium chloride 0.9 % 100 mL IVPB  Status:  Discontinued     2 g 200 mL/hr over 30 Minutes Intravenous Every 24 hours 06/27/19 0614 06/27/19 1618       Assessment/Plan T1DM - adjusted SSI and lantus per diabetes coordinator recommendations  Acute cholecystitis S/P lap chole 4/21 - POD#2 - patient unable to keep down liquids and reporting dark urine  - LFTs and Tbili yesterday afternoon elevated - repeat this AM - possible HIDA to r/o bile leak if Tbili still up - add compazine for refractory nausea and vomiting  FEN: NPO, IVF VTE: SCDs, lovenox ID: rocephin 4/21  LOS: 0 days    Norm Parcel , [facility]  06/29/2019, 10:04 AM Please see Amion for pager number during day hours 7:00am-4:30pm

## 2019-06-30 DIAGNOSIS — E109 Type 1 diabetes mellitus without complications: Secondary | ICD-10-CM | POA: Diagnosis present

## 2019-06-30 DIAGNOSIS — M797 Fibromyalgia: Secondary | ICD-10-CM | POA: Diagnosis present

## 2019-06-30 DIAGNOSIS — R7989 Other specified abnormal findings of blood chemistry: Secondary | ICD-10-CM | POA: Diagnosis present

## 2019-06-30 DIAGNOSIS — G8929 Other chronic pain: Secondary | ICD-10-CM | POA: Diagnosis present

## 2019-06-30 DIAGNOSIS — Z823 Family history of stroke: Secondary | ICD-10-CM | POA: Diagnosis not present

## 2019-06-30 DIAGNOSIS — K8000 Calculus of gallbladder with acute cholecystitis without obstruction: Secondary | ICD-10-CM | POA: Diagnosis present

## 2019-06-30 DIAGNOSIS — Z20822 Contact with and (suspected) exposure to covid-19: Secondary | ICD-10-CM | POA: Diagnosis present

## 2019-06-30 DIAGNOSIS — Z803 Family history of malignant neoplasm of breast: Secondary | ICD-10-CM | POA: Diagnosis not present

## 2019-06-30 DIAGNOSIS — Z8249 Family history of ischemic heart disease and other diseases of the circulatory system: Secondary | ICD-10-CM | POA: Diagnosis not present

## 2019-06-30 DIAGNOSIS — Z833 Family history of diabetes mellitus: Secondary | ICD-10-CM | POA: Diagnosis not present

## 2019-06-30 DIAGNOSIS — K8020 Calculus of gallbladder without cholecystitis without obstruction: Secondary | ICD-10-CM | POA: Diagnosis present

## 2019-06-30 DIAGNOSIS — Z794 Long term (current) use of insulin: Secondary | ICD-10-CM | POA: Diagnosis not present

## 2019-06-30 LAB — CBC
HCT: 32.6 % — ABNORMAL LOW (ref 36.0–46.0)
Hemoglobin: 10.6 g/dL — ABNORMAL LOW (ref 12.0–15.0)
MCH: 30.6 pg (ref 26.0–34.0)
MCHC: 32.5 g/dL (ref 30.0–36.0)
MCV: 94.2 fL (ref 80.0–100.0)
Platelets: 240 10*3/uL (ref 150–400)
RBC: 3.46 MIL/uL — ABNORMAL LOW (ref 3.87–5.11)
RDW: 13.8 % (ref 11.5–15.5)
WBC: 6.6 10*3/uL (ref 4.0–10.5)
nRBC: 0 % (ref 0.0–0.2)

## 2019-06-30 LAB — GLUCOSE, CAPILLARY
Glucose-Capillary: 95 mg/dL (ref 70–99)
Glucose-Capillary: 196 mg/dL — ABNORMAL HIGH (ref 70–99)

## 2019-06-30 MED ORDER — TRAMADOL HCL 50 MG PO TABS: 50.0000 mg | ORAL_TABLET | Freq: Four times a day (QID) | ORAL | 0 refills | Status: DC | PRN

## 2019-06-30 MED ORDER — PROMETHAZINE HCL 12.5 MG PO TABS: 12.5000 mg | ORAL_TABLET | Freq: Four times a day (QID) | ORAL | 0 refills | Status: DC | PRN

## 2019-06-30 MED ORDER — PROMETHAZINE HCL 25 MG PO TABS: 12.5000 mg | ORAL_TABLET | Freq: Four times a day (QID) | ORAL | Status: DC | PRN

## 2019-06-30 MED ORDER — IBUPROFEN 200 MG PO TABS: ORAL_TABLET | ORAL | Status: DC

## 2019-06-30 NOTE — Progress Notes (Signed)
3 Days Post-Op    CC: Abdominal pain  Subjective: Patient taking full liquids had some mashed potatoes feeling better.  Still having some intermittent nausea but would like to go home.  Port sites all look fine.  Objective: Vital signs in last 24 hours: Temp:  [98.4 F (36.9 C)-100.3 F (37.9 C)] 98.6 F (37 C) (04/24 0856) Pulse Rate:  [83-96] 91 (04/24 0856) Resp:  [16-23] 23 (04/24 0856) BP: (126-152)/(78-91) 152/91 (04/24 0856) SpO2:  [98 %-100 %] 100 % (04/24 0856) Last BM Date: 06/26/19  Intake/Output from previous day: 04/23 0701 - 04/24 0700 In: 600 [P.O.:600] Out: 800 [Urine:800] Intake/Output this shift: Total I/O In: 240 [P.O.:240] Out: 900 [Urine:900]  General appearance: alert, cooperative and no distress Resp: clear to auscultation bilaterally GI: Soft, sore, nausea seems resolved currently.  Port sites are fine.  Lab Results:  Recent Labs    06/29/19 0243 06/30/19 0846  WBC 6.5 6.6  HGB 10.5* 10.6*  HCT 32.4* 32.6*  PLT 236 240    BMET Recent Labs    06/29/19 0243 06/29/19 1653  NA 137 137  K 4.1 3.6  CL 106 104  CO2 23 25  GLUCOSE 235* 129*  BUN <5* <5*  CREATININE 0.53 0.61  CALCIUM 8.1* 8.2*   PT/INR No results for input(s): LABPROT, INR in the last 72 hours.  Recent Labs  Lab 06/27/19 0105 06/28/19 1452 06/29/19 0243 06/29/19 1653  AST 524* 621* 371* 201*  ALT 188* 728* 622* 512*  ALKPHOS 109 252* 246* 271*  BILITOT 0.8 3.3* 2.6* 1.4*  PROT 7.4 5.7* 5.4* 5.6*  ALBUMIN 3.7 2.8* 2.7* 2.5*     Lipase     Component Value Date/Time   LIPASE 14 06/29/2019 0243     Medications: . amphetamine-dextroamphetamine  20 mg Oral Q breakfast  . buPROPion  450 mg Oral Daily  . enoxaparin (LOVENOX) injection  40 mg Subcutaneous Q24H  . insulin aspart  0-5 Units Subcutaneous QHS  . insulin aspart  0-9 Units Subcutaneous TID WC  . insulin glargine  20 Units Subcutaneous Daily  . traMADol  50 mg Oral Q6H   Prior to Admission  medications   Medication Sig Start Date End Date Taking? Authorizing Provider  5-Hydroxytryptophan (5-HTP PO) Take 1 capsule by mouth daily.    Yes [provider]  amphetamine-dextroamphetamine (ADDERALL) 20 MG tablet 1 po q am, 1/2 at noon. Patient taking differently: Take 20 mg by mouth See admin instructions. 20mg  in the morning, and 10mg  at noon. Takes as needed. 05/30/19  Yes Mozingo, , NP  ASHWAGANDHA PO Take 1 capsule by mouth daily.    Yes [provider]  B-COMPLEX-C PO Take 1 capsule by mouth daily.    Yes [provider]  buPROPion (WELLBUTRIN XL) 150 MG 24 hr tablet TAKE THREE TABLETS EVERY MORNING. Patient taking differently: Take 450 mg by mouth in the morning.  04/03/19  Yes Mozingo, Thereasa Solo, NP  cetirizine (ZYRTEC) 10 MG tablet Take 10 mg by mouth daily.   Yes [provider]  Clindamycin Phosphate foam Apply 1 application topically See admin instructions. Apply to chest, back, and shoulders daily. 06/08/19  Yes [provider]  clobetasol ointment (TEMOVATE) 0.05 % Apply 1 application topically 2 (two) times daily. 06/15/19  Yes [provider]  etonogestrel (NEXPLANON) 68 MG IMPL implant 1 each by Subdermal route once. March 2017   Yes [provider]  famotidine (PEPCID) 20 MG tablet Take 1  tablet (20 mg total) by mouth 2 (two) times daily. 10/26/17 06/27/19 Yes Doreatha Lew, MD  ferrous sulfate 324 (65 Fe) MG TBEC Take 1 tablet by mouth daily. 04/08/19  Yes [provider]  Ginkgo Biloba Extract 60 MG CAPS Take 1 capsule by mouth daily.    Yes [provider]  hydroquinone 4 % cream Apply 1 application topically at bedtime. 06/04/19  Yes [provider]  insulin aspart (NOVOLOG FLEXPEN) 100 UNIT/ML FlexPen Inject 5-20 Units into the skin See admin instructions. Sliding scale starting at 7 units and then 1 additional unit per 50mg  above blood sugar reading - per patient.    Yes [provider]  Insulin Degludec (TRESIBA FLEXTOUCH) 100 UNIT/ML SOPN Inject 50 Units into the skin daily.  12/14/14  Yes [provider]  ondansetron (ZOFRAN-ODT) 4 MG disintegrating tablet Take 4 mg by mouth every 8 (eight) hours as needed for nausea or vomiting.  10/24/17  Yes [provider]  oxyCODONE-acetaminophen (PERCOCET) 7.5-325 MG tablet Take 1 tablet by mouth every 6 (six) hours as needed for severe pain. for pain 10/20/17  Yes [provider]  tretinoin (RETIN-A) 0.05 % cream Apply 1 application topically See admin instructions. Apply to shoulders and back every night. 06/04/19  Yes [provider]  Turmeric (RA TURMERIC) 500 MG CAPS Take 500 mg by mouth daily.    Yes [provider]  venlafaxine XR (EFFEXOR-XR) 75 MG 24 hr capsule Take 150 mg by mouth at bedtime.    Yes [provider]  Vitamin D, Ergocalciferol, (DRISDOL) 1.25 MG (50000 UNIT) CAPS capsule Take 50,000 Units by mouth once a week. 04/27/19  Yes [provider]  famotidine (PEPCID) 40 MG tablet Take 20 mg by mouth 2 (two) times daily. 05/08/19   [provider]   . sodium chloride 100 mL/hr at 06/30/19 0304     Assessment/Plan Elevated LFTs  -Negative HIDA  -Improving LFTs  -Negative hepatitis panel Fibromyalgia/chronic pain -Percocet 7.5/325 every 6 hours as needed Type 1 diabetes ADD   Intractable biliary colic Laparoscopic cholecystectomy 06/27/2019 Dr. Georganna Skeans   FEN: IV fluids/heart healthy carb modified ID: Rocephin preop DVT: Lovenox Follow-up: [facility]   Plan: Patient would like to go home.  She wants something for nausea and says Zofran does not work she says Phenergan and Compazine do work. She has had issues with elevated LFTs before, and I recommended she follow-up with her primary care and her gastroenterologist to look into her elevated LFTs.  She is on multiple medications from her PCP and pain management  physicians.  We also discussed pain management.  I recommended she not increase her Tylenol consumption with her elevated LFTs. I will give her a few tramadol, she can continue her home dose of Percocet, and continue ibuprofen she was placed on here.    LOS: 0 days    Theresa [name] 06/30/2019 Please see Amion

## 2019-07-01 ENCOUNTER — Emergency Department (HOSPITAL_COMMUNITY)
Admission: EM | Admit: 2019-07-01 | Discharge: 2019-07-03 | Disposition: A | Discharge status: Home / Self Care | Payer: BC Managed Care – PPO | Attending: Emergency Medicine | Admitting: Emergency Medicine

## 2019-07-01 ENCOUNTER — Other Ambulatory Visit: Payer: Self-pay

## 2019-07-01 DIAGNOSIS — Z794 Long term (current) use of insulin: Secondary | ICD-10-CM | POA: Diagnosis not present

## 2019-07-01 DIAGNOSIS — R112 Nausea with vomiting, unspecified: Secondary | ICD-10-CM | POA: Diagnosis not present

## 2019-07-01 DIAGNOSIS — R1013 Epigastric pain: Secondary | ICD-10-CM | POA: Insufficient documentation

## 2019-07-01 DIAGNOSIS — R1084 Generalized abdominal pain: Secondary | ICD-10-CM | POA: Diagnosis present

## 2019-07-01 DIAGNOSIS — Z9889 Other specified postprocedural states: Secondary | ICD-10-CM | POA: Diagnosis not present

## 2019-07-01 DIAGNOSIS — E1065 Type 1 diabetes mellitus with hyperglycemia: Secondary | ICD-10-CM | POA: Diagnosis not present

## 2019-07-01 DIAGNOSIS — R739 Hyperglycemia, unspecified: Secondary | ICD-10-CM

## 2019-07-01 LAB — I-STAT BETA HCG BLOOD, ED (MC, WL, AP ONLY): I-stat hCG, quantitative: <5 m[IU]/mL (ref <5)

## 2019-07-01 LAB — COMPREHENSIVE METABOLIC PANEL
ALT: 250 U/L — ABNORMAL HIGH (ref 0–44)
AST: 41 U/L (ref 15–41)
Albumin: 3 g/dL — ABNORMAL LOW (ref 3.5–5.0)
Alkaline Phosphatase: 277 U/L — ABNORMAL HIGH (ref 38–126)
Anion gap: 9 (ref 5–15)
BUN: <5 mg/dL — ABNORMAL LOW (ref 6–20)
CO2: 25 mmol/L (ref 22–32)
Calcium: 8.9 mg/dL (ref 8.9–10.3)
Chloride: 103 mmol/L (ref 98–111)
Creatinine, Ser: 0.65 mg/dL (ref 0.44–1.00)
GFR calc Af Amer: >60 mL/min (ref >60)
GFR calc non Af Amer: >60 mL/min (ref >60)
Glucose, Bld: 232 mg/dL — ABNORMAL HIGH (ref 70–99)
Potassium: 4.2 mmol/L (ref 3.5–5.1)
Sodium: 137 mmol/L (ref 135–145)
Total Bilirubin: 1.1 mg/dL (ref 0.3–1.2)
Total Protein: 6.3 g/dL — ABNORMAL LOW (ref 6.5–8.1)

## 2019-07-01 LAB — CBC
HCT: 33.7 % — ABNORMAL LOW (ref 36.0–46.0)
Hemoglobin: 11 g/dL — ABNORMAL LOW (ref 12.0–15.0)
MCH: 31 pg (ref 26.0–34.0)
MCHC: 32.6 g/dL (ref 30.0–36.0)
MCV: 94.9 fL (ref 80.0–100.0)
Platelets: 273 10*3/uL (ref 150–400)
RBC: 3.55 MIL/uL — ABNORMAL LOW (ref 3.87–5.11)
RDW: 13.5 % (ref 11.5–15.5)
WBC: 6.7 10*3/uL (ref 4.0–10.5)
nRBC: 0 % (ref 0.0–0.2)

## 2019-07-01 LAB — LIPASE, BLOOD: Lipase: 15 U/L (ref 11–51)

## 2019-07-01 MED ORDER — ONDANSETRON 4 MG PO TBDP
4.0000 mg | ORAL_TABLET | Freq: Once | ORAL | Status: AC | PRN
  Administered 2019-07-01: 4 mg via ORAL
  Filled 2019-07-01: qty 1

## 2019-07-01 NOTE — ED Triage Notes (Signed)
Pt had her gallbladder removed here on Wednesday, but states she continues to have have N/V,abd pain.

## 2019-07-02 ENCOUNTER — Emergency Department (HOSPITAL_COMMUNITY): Payer: BC Managed Care – PPO

## 2019-07-02 LAB — CBG MONITORING, ED: Glucose-Capillary: 225 mg/dL — ABNORMAL HIGH (ref 70–99)

## 2019-07-02 MED ORDER — SODIUM CHLORIDE 0.9 % IV BOLUS (SEPSIS): 1000.0000 mL | Freq: Once | INTRAVENOUS | Status: DC

## 2019-07-02 MED ORDER — PROCHLORPERAZINE EDISYLATE 10 MG/2ML IJ SOLN
10.0000 mg | Freq: Once | INTRAMUSCULAR | Status: AC
  Administered 2019-07-02: 10 mg via INTRAVENOUS
  Filled 2019-07-02: qty 2

## 2019-07-02 MED ORDER — SODIUM CHLORIDE 0.9 % IV SOLN: 1000.0000 mL | INTRAVENOUS | Status: DC

## 2019-07-02 MED ORDER — ALUM & MAG HYDROXIDE-SIMETH 400-400-40 MG/5ML PO SUSP: 15.0000 mL | Freq: Four times a day (QID) | ORAL | 0 refills | Status: DC | PRN

## 2019-07-02 MED ORDER — HYOSCYAMINE SULFATE SL 0.125 MG SL SUBL
1.0000 | SUBLINGUAL_TABLET | Freq: Four times a day (QID) | SUBLINGUAL | 0 refills | Status: DC | PRN
Start: 2019-07-02 — End: 2020-04-10

## 2019-07-02 MED ORDER — LIDOCAINE VISCOUS HCL 2 % MT SOLN
15.0000 mL | Freq: Four times a day (QID) | OROMUCOSAL | 0 refills | Status: DC | PRN
Start: 2019-07-02 — End: 2020-04-07

## 2019-07-02 MED ORDER — HYOSCYAMINE SULFATE 0.125 MG SL SUBL
0.2500 mg | SUBLINGUAL_TABLET | Freq: Once | SUBLINGUAL | Status: AC
  Administered 2019-07-02: 0.25 mg via SUBLINGUAL
  Filled 2019-07-02: qty 2

## 2019-07-02 MED ORDER — PROMETHAZINE HCL 25 MG RE SUPP
25.0000 mg | Freq: Four times a day (QID) | RECTAL | 0 refills | Status: DC | PRN
Start: 2019-07-02 — End: 2020-04-10

## 2019-07-02 MED ORDER — IOHEXOL 300 MG/ML  SOLN
100.0000 mL | Freq: Once | INTRAMUSCULAR | Status: AC | PRN
  Administered 2019-07-02: 100 mL via INTRAVENOUS

## 2019-07-02 MED ORDER — LIDOCAINE VISCOUS HCL 2 % MT SOLN
15.0000 mL | Freq: Once | OROMUCOSAL | Status: DC
  Filled 2019-07-02: qty 15

## 2019-07-02 MED ORDER — HYDROMORPHONE HCL 1 MG/ML IJ SOLN
1.0000 mg | Freq: Once | INTRAMUSCULAR | Status: AC
  Administered 2019-07-02: 1 mg via INTRAVENOUS
  Filled 2019-07-02: qty 1

## 2019-07-02 MED ORDER — ALUM & MAG HYDROXIDE-SIMETH 200-200-20 MG/5ML PO SUSP
30.0000 mL | Freq: Once | ORAL | Status: DC
  Filled 2019-07-02: qty 30

## 2019-07-02 NOTE — ED Notes (Signed)
Pt still c/o nausea vomiting after being here for the same this Saturday night

## 2019-07-02 NOTE — ED Provider Notes (Signed)
[facility] EMERGENCY DEPARTMENT Provider Note  CSN: [id number] Arrival date & time: 07/01/19 2116  Chief Complaint(s) Abdominal Pain and Emesis  HPI Theresa [name] is a 29 y.o. female with a past medical history of type 1 insulin-dependent diabetes and recent lab cholecystectomy who is 5 days postop presents to the emergency department with persistent generalized abdominal pain that was noted immediately after surgery.  Patient's reports that she has been having increased nausea and now nonbloody nonbilious emesis for 1 day.  She denies any fevers or chills.  No diarrhea.  Patient has home Phenergan and Zofran that does not provide any relief.  No other physical complaints.  HPI  Past Medical History Past Medical History:  Diagnosis Date  . Fibromyalgia   . Fibromyalgia 06/28/2019  . Headache   . Insomnia   . Insulin dependent type 1 diabetes mellitus (HCC)   . 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  Recommend follow-up Pap smear/HPV at next annual exam.  . Peripheral neuropathy   . Seasonal allergies   . Traction retinal detachment    vitreou hemorrhage right eye   Patient Active Problem List   Diagnosis Date Noted  . Fibromyalgia 06/28/2019  . Gallstones 06/27/2019  . Diabetes mellitus with polyneuropathy (HCC) 04/10/2019  . CN (constipation) 04/10/2019  . Allergic rhinitis 04/10/2019  . Diabetes mellitus type 2, uncontrolled (HCC) 04/10/2019  . Abdominal pain, acute, generalized 04/03/2019  . Nausea and vomiting 04/03/2019  . Liver lesion, left lobe 10/26/2017  . Hypokalemia 10/26/2017  . Anxiety 10/26/2017  . 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 Diabetes (HCC) 11/04/2015  . 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 Peripheral neuropathy, idiopathic 07/28/2015  . Insomnia 06/04/2014  . Total body pain  04/10/2014  . Neuropathic pain of both feet 02/27/2014  . Bilateral thoracic back pain 02/07/2014  . Infection of skin and subcutaneous tissue 11/21/2013  . Peripheral neuralgia 11/21/2013  . Fibrositis 09/29/2013  . Intractable nausea and vomiting 09/29/2013  . DKA (diabetic ketoacidoses) (HCC) 07/26/2013  . Type 1 diabetes mellitus (HCC) 11/22/2011  . Yeast vaginitis 11/22/2011   Home Medication(s) Prior to Admission medications   Medication Sig Start Date End Date Taking? Authorizing Provider  5-Hydroxytryptophan (5-HTP PO) Take 1 capsule by mouth daily.     [provider]  alum & mag hydroxide-simeth (MAALOX ADVANCED MAX ST) 400-400-40 MG/5ML suspension Take 15 mLs by mouth every 6 (six) hours as needed for indigestion. 07/02/19   Neema Barreira, Amadeo Garnet, MD  amphetamine-dextroamphetamine (ADDERALL) 20 MG tablet 1 po q am, 1/2 at noon. Patient taking differently: Take 20 mg by mouth See admin instructions. 20mg  in the morning, and 10mg  at noon. Takes as needed. 05/30/19   Mozingo, , NP  ASHWAGANDHA PO Take 1 capsule by mouth daily.     [provider]  B-COMPLEX-C PO Take 1 capsule by mouth daily.     [provider]  buPROPion (WELLBUTRIN XL) 150 MG 24 hr tablet TAKE THREE TABLETS EVERY MORNING. Patient taking differently: Take 450 mg by mouth in the morning.  04/03/19   Mozingo, Thereasa Solo, NP  cetirizine (ZYRTEC) 10 MG tablet Take 10 mg by mouth daily.    [provider]  Clindamycin Phosphate foam Apply 1 application topically See admin instructions. Apply to chest, back, and shoulders daily. 06/08/19   [provider]  clobetasol ointment (  TEMOVATE) 0.05 % Apply 1 application topically 2 (two) times daily. 06/15/19   [provider]  etonogestrel (NEXPLANON) 68 MG IMPL implant 1 each by Subdermal route once. March 2017    [provider]  famotidine (PEPCID) 20 MG tablet Take 1 tablet (20 mg total) by mouth  2 (two) times daily. 10/26/17 06/27/19  Lenox Ponds, MD  famotidine (PEPCID) 40 MG tablet Take 20 mg by mouth 2 (two) times daily. 05/08/19   [provider]  ferrous sulfate 324 (65 Fe) MG TBEC Take 1 tablet by mouth daily. 04/08/19   [provider]  Ginkgo Biloba Extract 60 MG CAPS Take 1 capsule by mouth daily.     [provider]  hydroquinone 4 % cream Apply 1 application topically at bedtime. 06/04/19   [provider]  Hyoscyamine Sulfate SL (LEVSIN/SL) 0.125 MG SUBL Place 1 each under the tongue 4 (four) times daily as needed for up to 5 days. 07/02/19 07/07/19  Nira Conn, MD  ibuprofen (ADVIL) 200 MG tablet You can drink 2 to 3 tablets every 6 hours as needed for pain.  You can buy this over-the-counter at any drugstore. 06/30/19   Sherrie George, PA-C  insulin aspart (NOVOLOG FLEXPEN) 100 UNIT/ML FlexPen Inject 5-20 Units into the skin See admin instructions. Sliding scale starting at 7 units and then 1 additional unit per  above blood sugar reading - per patient.    [provider]  Insulin Degludec (TRESIBA FLEXTOUCH) 100 UNIT/ML SOPN Inject 50 Units into the skin daily.  12/14/14   [provider]  lidocaine (XYLOCAINE) 2 % solution Use as directed 15 mLs in the mouth or throat every 6 (six) hours as needed for mouth pain. 07/02/19   Nira Conn, MD  ondansetron (ZOFRAN-ODT) 4 MG disintegrating tablet Take 4 mg by mouth every 8 (eight) hours as needed for nausea or vomiting.  10/24/17   [provider]  oxyCODONE-acetaminophen (PERCOCET) 7.5-325 MG tablet Take 1 tablet by mouth every 6 (six) hours as needed for severe pain. for pain 10/20/17   [provider]  promethazine (PHENERGAN) 25 MG suppository Place 1 suppository (25 mg total) rectally every 6 (six) hours as needed for nausea or vomiting. 07/02/19   Karene Bracken, Amadeo Garnet, MD  traMADol (ULTRAM) 50 MG tablet Take 1 tablet (50 mg total)  by mouth every 6 (six) hours as needed. 06/30/19   Sherrie George, PA-C  tretinoin (RETIN-A) 0.05 % cream Apply 1 application topically See admin instructions. Apply to shoulders and back every night. 06/04/19   [provider]  Turmeric (RA TURMERIC) 500 MG CAPS Take 500 mg by mouth daily.     [provider]  venlafaxine XR (EFFEXOR-XR) 75 MG 24 hr capsule Take 150 mg by mouth at bedtime.     [provider]  Vitamin D, Ergocalciferol, (DRISDOL) 1.25 MG (50000 UNIT) CAPS capsule Take 50,000 Units by mouth once a week. 04/27/19   [provider]  Past Surgical History Past Surgical History:  Procedure Laterality Date  . CATARACT EXTRACTION W/ INTRAOCULAR LENS  IMPLANT, BILATERAL Bilateral 05/2014 - 06/2014  . CHOLECYSTECTOMY N/A 06/27/2019   Procedure: LAPAROSCOPIC CHOLECYSTECTOMY;  Surgeon: Violeta Gelinas, MD;  Location: [facility] OR;  Service: General;  Laterality: N/A;  . 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 EYE SURGERY    . MASS EXCISION Right 07/10/2013   Procedure: MINOR EXCISION RIGHT NECK CYST;  Surgeon: Drema Halon, MD;  Location: [facility];  Service: ENT;  Laterality: Right;  . Nexplanon  06/27/2018  . PARS PLANA REPAIR OF RETINAL DEATACHMENT Right 11/04/2015  . PARS PLANA VITRECTOMY Right 11/04/2015   Procedure: PARS PLANA VITRECTOMY WITH 25 GAUGE TO REPAIR A COMPLEX TRACTION RETINAL DETACHMENT;  Surgeon: Sherrie George, MD;  Location: [facility] OR;  Service: Ophthalmology;  Laterality: Right;  . WISDOM TOOTH EXTRACTION  09/2015   Family History Family History  Problem Relation Age of Onset  . Diabetes Mother   . Migraines Mother   . Hypertension Mother   . CVA Mother   . Breast cancer Maternal Grandmother   . Thyroid disease  Maternal Grandmother   . Cancer Maternal Grandfather     Social History Social History   Tobacco Use  . Smoking status: Never Smoker  . Smokeless tobacco: Never Used  Substance Use Topics  . Alcohol use: No    Alcohol/week: 0.0 standard drinks  . Drug use: No   Allergies Tapentadol, Aloe, Duloxetine, Gabapentin, Lyrica [pregabalin], Olive oil, and Zonisamide  Review of Systems Review of Systems All other systems are reviewed and are negative for acute change except as noted in the HPI  Physical Exam Vital Signs  I have reviewed the triage vital signs BP (!) 153/100   Pulse (!) 110   Temp 98.7 F (37.1 C)   Resp 16   SpO2 100%   Physical Exam Vitals reviewed.  Constitutional:      General: She is not in acute distress.    Appearance: She is well-developed. She is not diaphoretic.  HENT:     Head: Normocephalic and atraumatic.     Nose: Nose normal.  Eyes:     General: No scleral icterus.       Right eye: No discharge.        Left eye: No discharge.     Conjunctiva/sclera: Conjunctivae normal.     Pupils: Pupils are equal, round, and reactive to light.  Cardiovascular:     Rate and Rhythm: Normal rate and regular rhythm.     Heart sounds: No murmur. No friction rub. No gallop.   Pulmonary:     Effort: Pulmonary effort is normal. No respiratory distress.     Breath sounds: Normal breath sounds. No stridor. No rales.  Abdominal:     General: There is no distension.     Palpations: Abdomen is soft.     Tenderness: There is abdominal tenderness in the right upper quadrant and epigastric area.  Musculoskeletal:        General: No tenderness.     Cervical back: Normal range of motion and neck supple.  Skin:    General: Skin is warm and dry.     Findings: No erythema or rash.  Neurological:     Mental Status: She is alert and oriented to person, place, and time.     ED Results and Treatments Labs (all  labs ordered are listed, but only abnormal results are  displayed) Labs Reviewed  COMPREHENSIVE METABOLIC PANEL - Abnormal; Notable for the following components:      Result Value   Glucose, Bld 232 (*)    BUN <5 (*)    Total Protein 6.3 (*)    Albumin 3.0 (*)    ALT 250 (*)    Alkaline Phosphatase 277 (*)    All other components within normal limits  CBC - Abnormal; Notable for the following components:   RBC 3.55 (*)    Hemoglobin 11.0 (*)    HCT 33.7 (*)    All other components within normal limits  CBG MONITORING, ED - Abnormal; Notable for the following components:   Glucose-Capillary 225 (*)    All other components within normal limits  LIPASE, BLOOD  I-STAT BETA HCG BLOOD, ED ([facility], [facility], [facility] ONLY)                                                                                                                         EKG  EKG Interpretation  Date/Time:  Sunday July 01 2019 21:23:29 EDT Ventricular Rate:  90 PR Interval:  144 QRS Duration: 86 QT Interval:  368 QTC Calculation: 450 R Axis:   84 Text Interpretation: Normal sinus rhythm Normal ECG No acute changes Confirmed by Drema Pryardama, Kalisha Keadle [phone]([id number]) on 07/02/2019 3:04:47 AM      Radiology CT ABDOMEN PELVIS W CONTRAST  Result Date: 07/02/2019 CLINICAL DATA:  Nausea and vomiting after recent laparoscopic cholecystectomy. EXAM: CT ABDOMEN AND PELVIS WITH CONTRAST TECHNIQUE: Multidetector CT imaging of the abdomen and pelvis was performed using the standard protocol following bolus administration of intravenous contrast. CONTRAST:  100mL OMNIPAQUE IOHEXOL 300 MG/ML  SOLN COMPARISON:  10/22/2017 FINDINGS: Lower chest:  No acute finding Hepatobiliary: No focal liver abnormality.Cholecystectomy with clips and mild fat stranding in the gallbladder fossa. No organized collection or calcified dropped stone. No bile duct dilatation. Pancreas: Generalized atrophic appearance for age. Spleen: Unremarkable. Adrenals/Urinary Tract: Negative adrenals. No hydronephrosis or stone. Mild lobulation of the  kidneys attributed to scarring. Unremarkable bladder. Stomach/Bowel:  No obstruction. No evidence of bowel inflammation. Vascular/Lymphatic: No acute vascular abnormality. No mass or adenopathy. Reproductive:No pathologic findings. Other: No ascites or pneumoperitoneum. Areas of subcutaneous stranding from port site scattered along the abdominal wall. No collection or hematoma. Subcutaneous nodule along the anterior left pelvic wall, usually dermal inclusion cyst. Musculoskeletal: No acute abnormalities. IMPRESSION: No unexpected finding after cholecystectomy. Electronically Signed   By: Marnee SpringJonathon  Watts M.D.   On: 07/02/2019 06:26    Pertinent labs & imaging results that were available during my care of the patient were reviewed by me and considered in my medical decision making (see chart for details).  Medications Ordered in ED Medications  sodium chloride 0.9 % bolus 1,000 mL (has no administration in time range)    Followed by  sodium chloride 0.9 % bolus 1,000 mL (has no administration in time range)  Followed by  0.9 %  sodium chloride infusion (has no administration in time range)  alum & mag hydroxide-simeth (MAALOX/MYLANTA) 200-200-20 MG/5ML suspension 30 mL (has no administration in time range)    And  lidocaine (XYLOCAINE) 2 % viscous mouth solution 15 mL (has no administration in time range)  ondansetron (ZOFRAN-ODT) disintegrating tablet 4 mg (4 mg Oral Given 07/01/19 2134)  HYDROmorphone (DILAUDID) injection 1 mg (1 mg Intravenous Given 07/02/19 0553)  prochlorperazine (COMPAZINE) injection 10 mg (10 mg Intravenous Given 07/02/19 0549)  iohexol (OMNIPAQUE) 300 MG/ML solution 100 mL (100 mLs Intravenous Contrast Given 07/02/19 0604)  hyoscyamine (LEVSIN SL) SL tablet 0.25 mg (0.25 mg Sublingual Given 07/02/19 7341)                                                                                                                                    Procedures Procedures  (including  critical care time)  Medical Decision Making / ED Course I have reviewed the nursing notes for this encounter and the patient's prior records (if available in EHR or on provided paperwork).   Theresa [name] was evaluated in Emergency Department on 07/02/2019 for the symptoms described in the history of present illness. She was evaluated in the context of the global COVID-19 pandemic, which necessitated consideration that the patient might be at risk for infection with the SARS-CoV-2 virus that causes COVID-19. Institutional protocols and algorithms that pertain to the evaluation of patients at risk for COVID-19 are in a state of rapid change based on information released by regulatory bodies including the CDC and federal and state organizations. These policies and algorithms were followed during the patient's care in the ED.  Abdominal pain with nausea and nonbloody nonbilious emesis 5 days post laparoscopic cholecystectomy.  Moderate upper abdominal tenderness to palpation.  Labs reassuring without leukocytosis.  Hyperglycemia without evidence of DKA.  Improving LFTs.  No evidence of pancreatitis.  CT scan without evidence of acute intra-abdominal inflammatory/infectious process or bowel obstruction.  Patient treated symptomatically with IV fluids, nausea medication and pain medicine.  Also given GI cocktail which provided significant relief.        Final Clinical Impression(s) / ED Diagnoses Final diagnoses:  Epigastric pain  Nausea and vomiting in adult  Hyperglycemia    The patient appears reasonably screened and/or stabilized for discharge and I doubt any other medical condition or other [facility] requiring further screening, evaluation, or treatment in the ED at this time prior to discharge. Safe for discharge with strict return precautions.  Disposition: Discharge  Condition: Good  I have discussed the results, Dx and Tx plan with the patient/family who expressed understanding and  agree(s) with the plan. Discharge instructions discussed at length. The patient/family was given strict return precautions who verbalized understanding of the instructions. No further questions at time of discharge.    ED Discharge Orders         Ordered  lidocaine (XYLOCAINE) 2 % solution  Every 6 hours PRN     07/02/19 0714    alum & mag hydroxide-simeth (MAALOX ADVANCED MAX ST) 400-400-40 MG/5ML suspension  Every 6 hours PRN     07/02/19 0714    Hyoscyamine Sulfate SL (LEVSIN/SL) 0.125 MG SUBL  4 times daily PRN     07/02/19 0714    promethazine (PHENERGAN) 25 MG suppository  Every 6 hours PRN     07/02/19 3664           Follow Up: Simona Huh, NP [place] [address] [phone]  Schedule an appointment as soon as possible for a visit  As needed     This chart was dictated using voice recognition software.  Despite best efforts to proofread,  errors can occur which can change the documentation meaning.   Fatima Blank, MD 07/02/19 [phone]

## 2019-07-05 ENCOUNTER — Emergency Department (HOSPITAL_COMMUNITY): Payer: BC Managed Care – PPO

## 2019-07-05 ENCOUNTER — Ambulatory Visit: Payer: BC Managed Care – PPO | Admitting: Adult Health

## 2019-07-05 ENCOUNTER — Other Ambulatory Visit: Payer: Self-pay

## 2019-07-05 ENCOUNTER — Encounter (HOSPITAL_COMMUNITY): Payer: Self-pay | Admitting: Pediatrics

## 2019-07-05 ENCOUNTER — Emergency Department (HOSPITAL_COMMUNITY)
Admission: EM | Admit: 2019-07-05 | Discharge: 2019-07-05 | Disposition: A | Discharge status: Home / Self Care | Payer: BC Managed Care – PPO | Attending: Emergency Medicine | Admitting: Emergency Medicine

## 2019-07-05 DIAGNOSIS — R10811 Right upper quadrant abdominal tenderness: Secondary | ICD-10-CM | POA: Diagnosis not present

## 2019-07-05 DIAGNOSIS — R10812 Left upper quadrant abdominal tenderness: Secondary | ICD-10-CM | POA: Diagnosis not present

## 2019-07-05 DIAGNOSIS — R112 Nausea with vomiting, unspecified: Secondary | ICD-10-CM

## 2019-07-05 DIAGNOSIS — Z79899 Other long term (current) drug therapy: Secondary | ICD-10-CM | POA: Diagnosis not present

## 2019-07-05 DIAGNOSIS — Z794 Long term (current) use of insulin: Secondary | ICD-10-CM | POA: Diagnosis not present

## 2019-07-05 DIAGNOSIS — R0789 Other chest pain: Secondary | ICD-10-CM | POA: Insufficient documentation

## 2019-07-05 DIAGNOSIS — E109 Type 1 diabetes mellitus without complications: Secondary | ICD-10-CM | POA: Diagnosis not present

## 2019-07-05 DIAGNOSIS — R1013 Epigastric pain: Secondary | ICD-10-CM

## 2019-07-05 DIAGNOSIS — Z9889 Other specified postprocedural states: Secondary | ICD-10-CM | POA: Diagnosis not present

## 2019-07-05 LAB — URINALYSIS, ROUTINE W REFLEX MICROSCOPIC
Bilirubin Urine: NEGATIVE
Glucose, UA: >=500 mg/dL — AB
Hgb urine dipstick: NEGATIVE
Ketones, ur: 80 mg/dL — AB
Leukocytes,Ua: NEGATIVE
Nitrite: NEGATIVE
Protein, ur: NEGATIVE mg/dL
Specific Gravity, Urine: 1.018 (ref 1.005–1.030)
pH: 6 (ref 5.0–8.0)

## 2019-07-05 LAB — CBC
HCT: 37.6 % (ref 36.0–46.0)
Hemoglobin: 12.2 g/dL (ref 12.0–15.0)
MCH: 30.8 pg (ref 26.0–34.0)
MCHC: 32.4 g/dL (ref 30.0–36.0)
MCV: 94.9 fL (ref 80.0–100.0)
Platelets: 378 10*3/uL (ref 150–400)
RBC: 3.96 MIL/uL (ref 3.87–5.11)
RDW: 13.2 % (ref 11.5–15.5)
WBC: 10.3 10*3/uL (ref 4.0–10.5)
nRBC: 0 % (ref 0.0–0.2)

## 2019-07-05 LAB — BASIC METABOLIC PANEL WITH GFR
Anion gap: 12 (ref 5–15)
BUN: 10 mg/dL (ref 6–20)
CO2: 25 mmol/L (ref 22–32)
Calcium: 8.8 mg/dL — ABNORMAL LOW (ref 8.9–10.3)
Chloride: 95 mmol/L — ABNORMAL LOW (ref 98–111)
Creatinine, Ser: 0.98 mg/dL (ref 0.44–1.00)
GFR calc Af Amer: >60 mL/min
GFR calc non Af Amer: >60 mL/min
Glucose, Bld: 295 mg/dL — ABNORMAL HIGH (ref 70–99)
Potassium: 4.8 mmol/L (ref 3.5–5.1)
Sodium: 132 mmol/L — ABNORMAL LOW (ref 135–145)

## 2019-07-05 LAB — I-STAT BETA HCG BLOOD, ED (MC, WL, AP ONLY): I-stat hCG, quantitative: <5 m[IU]/mL (ref <5)

## 2019-07-05 LAB — CBG MONITORING, ED: Glucose-Capillary: 194 mg/dL — ABNORMAL HIGH (ref 70–99)

## 2019-07-05 LAB — TROPONIN I (HIGH SENSITIVITY)
Troponin I (High Sensitivity): 5 ng/L (ref <18)
Troponin I (High Sensitivity): 5 ng/L (ref <18)

## 2019-07-05 LAB — LIPASE, BLOOD: Lipase: 13 U/L (ref 11–51)

## 2019-07-05 MED ORDER — PROCHLORPERAZINE MALEATE 10 MG PO TABS
10.0000 mg | ORAL_TABLET | Freq: Two times a day (BID) | ORAL | 0 refills | Status: DC | PRN
Start: 2019-07-05 — End: 2020-04-10

## 2019-07-05 MED ORDER — DICYCLOMINE HCL 10 MG/ML IM SOLN
20.0000 mg | Freq: Once | INTRAMUSCULAR | Status: AC
  Administered 2019-07-05: 20 mg via INTRAMUSCULAR
  Filled 2019-07-05: qty 2

## 2019-07-05 MED ORDER — DICYCLOMINE HCL 20 MG PO TABS
20.0000 mg | ORAL_TABLET | Freq: Two times a day (BID) | ORAL | 0 refills | Status: DC
Start: 2019-07-05 — End: 2020-04-10

## 2019-07-05 MED ORDER — SODIUM CHLORIDE 0.9% FLUSH
3.0000 mL | Freq: Once | INTRAVENOUS | Status: AC
  Administered 2019-07-05: 3 mL via INTRAVENOUS

## 2019-07-05 MED ORDER — ONDANSETRON HCL 4 MG/2ML IJ SOLN: 4.0000 mg | Freq: Once | INTRAMUSCULAR | Status: DC

## 2019-07-05 MED ORDER — HYDROMORPHONE HCL 1 MG/ML IJ SOLN: 2.0000 mg | Freq: Once | INTRAMUSCULAR | Status: DC

## 2019-07-05 MED ORDER — PROCHLORPERAZINE MALEATE 5 MG PO TABS: 5.0000 mg | ORAL_TABLET | Freq: Once | ORAL | Status: DC

## 2019-07-05 MED ORDER — METOCLOPRAMIDE HCL 5 MG/ML IJ SOLN
10.0000 mg | Freq: Once | INTRAMUSCULAR | Status: AC
  Administered 2019-07-05: 10 mg via INTRAVENOUS
  Filled 2019-07-05: qty 2

## 2019-07-05 MED ORDER — SODIUM CHLORIDE 0.9 % IV BOLUS
1000.0000 mL | Freq: Once | INTRAVENOUS | Status: AC
  Administered 2019-07-05: 1000 mL via INTRAVENOUS

## 2019-07-05 MED ORDER — IOHEXOL 300 MG/ML  SOLN
100.0000 mL | Freq: Once | INTRAMUSCULAR | Status: AC | PRN
  Administered 2019-07-05: 100 mL via INTRAVENOUS

## 2019-07-05 MED ORDER — MORPHINE SULFATE (PF) 2 MG/ML IV SOLN
2.0000 mg | Freq: Once | INTRAVENOUS | Status: DC
  Administered 2019-07-05: 2 mg via INTRAVENOUS
  Filled 2019-07-05: qty 1

## 2019-07-05 NOTE — Discharge Instructions (Addendum)
Your work-up in the ER today was overall reassuring.  There are no signs of small bowel obstruction or complications from your surgery.  Please take the prescribed nausea medication and antispasmodic medication called Bentyl as needed.  Continue to gently rehydrate with fluids, gradually work up to eating solids.  Please make sure to follow-up with your GI doctor within the week as previously discussed.  Return to the ER if your symptoms worsen.  Suggestions for rehydration:  Take an ORS (oral rehydration solution). This is a drink that is sold at pharmacies and stores. Drink clear fluids in small amounts as you are able, such as: Water. Ice chips. Fruit juice that has water added (diluted fruit juice). Low-calorie sports drinks. Eat bland, easy-to-digest foods in small amounts as you are able, such as: Bananas. Applesauce. Rice. Low-fat (lean) meats. Toast. Crackers. Avoid drinking fluids that have a lot of sugar or caffeine in them. This includes energy drinks, sports drinks, and soda. Avoid alcohol. Avoid spicy or fatty foods.

## 2019-07-05 NOTE — ED Provider Notes (Signed)
[facility] EMERGENCY DEPARTMENT Provider Note   CSN: [id number] Arrival date & time: 07/05/19  1222     History Chief Complaint  Patient presents with  . Post-op Problem  . Dehydration  . Chest Pain    Theresa [name] is a 29 y.o. female.  HPI 29 year old female with a history of DM type I, DKA, fibromyalgia, chronic pain, gallstones with recent cholecystectomy on 06/27/2019 presents to the ER with 1 week history of nausea, vomiting, not able to tolerate p.o.  History provided by patient.  She states that she has not been able to eat or drink anything since the surgery, endorses generalized abdominal pain, chest pain.  She notes persistent abdominal pain, nausea, vomiting immediately after surgery that has not improved.  Vomiting is nonbloody and nonbilious.  She states that this pain is unlike anything she has experienced.  She has home Zofran and Phenergan which has not helped.  Upon chart review, she came to the ER on 07/02/2019 with similar symptoms, CT was negative and she was discharged with Xylocaine, Maalox, Phenergan, Levsin with follow-up with PCP.  She notes no improvement in her symptoms since discharge.  She  denies any fevers, chills, swelling, discharge, redness around the incision site.  She has not had a bowel movement since the surgery.  She also notes increased vaginal discharge over the last few days, no bleeding. She does not endorse any shortness of breath, dizziness, lightheadedness, syncope, back pain, dysuria, hematuria. Past Medical History:  Diagnosis Date  . Fibromyalgia   . Fibromyalgia 06/28/2019  . Headache   . Insomnia   . Insulin dependent type 1 diabetes mellitus (HCC)   . 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  Recommend follow-up Pap smear/HPV at next annual exam.  . Peripheral neuropathy   . Seasonal allergies   . 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 Fibromyalgia 06/28/2019  . Gallstones 06/27/2019  . Diabetes mellitus with polyneuropathy (HCC) 04/10/2019  . CN (constipation) 04/10/2019  . Allergic rhinitis 04/10/2019  . Diabetes mellitus type 2, uncontrolled (HCC) 04/10/2019  . Abdominal pain, acute, generalized 04/03/2019  . Nausea and vomiting 04/03/2019  . Liver lesion, left lobe 10/26/2017  . Hypokalemia 10/26/2017  . Anxiety 10/26/2017  . 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 Diabetes (HCC) 11/04/2015  . 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 Peripheral neuropathy, idiopathic 07/28/2015  . Insomnia 06/04/2014  . Total body pain 04/10/2014  . Neuropathic pain of both feet 02/27/2014  . Bilateral thoracic back pain 02/07/2014  . Infection of skin and subcutaneous tissue 11/21/2013  . Peripheral neuralgia 11/21/2013  . Fibrositis 09/29/2013  . Intractable nausea and vomiting 09/29/2013  . DKA (diabetic ketoacidoses) (HCC) 07/26/2013  . Type 1 diabetes mellitus (HCC) 11/22/2011  . Yeast vaginitis 11/22/2011    Past Surgical History:  Procedure Laterality Date  . CATARACT EXTRACTION W/ INTRAOCULAR LENS  IMPLANT, BILATERAL Bilateral 05/2014 - 06/2014  . CHOLECYSTECTOMY N/A 06/27/2019   Procedure: LAPAROSCOPIC CHOLECYSTECTOMY;  Surgeon: Violeta Gelinas, MD;  Location: [facility] OR;  Service: General;  Laterality: N/A;  . 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 EYE SURGERY    . MASS EXCISION Right 07/10/2013   Procedure: MINOR  EXCISION RIGHT NECK CYST;  Surgeon: Rozetta Nunnery, MD;  Location: [place];  Service: ENT;  Laterality: Right;  . Nexplanon  06/27/2018  . PARS PLANA REPAIR OF RETINAL DEATACHMENT Right 11/04/2015  . PARS PLANA VITRECTOMY Right 11/04/2015   Procedure: PARS PLANA  VITRECTOMY WITH 25 GAUGE TO REPAIR A COMPLEX TRACTION RETINAL DETACHMENT;  Surgeon: Hayden Pedro, MD;  Location: [place];  Service: Ophthalmology;  Laterality: Right;  . WISDOM TOOTH EXTRACTION  09/2015     OB History    Gravida  1   Para      Term      Preterm      AB  1   Living  0     SAB  1   TAB      Ectopic      Multiple      Live Births              Family History  Problem Relation Age of Onset  . Diabetes Mother   . Migraines Mother   . Hypertension Mother   . CVA Mother   . Breast cancer Maternal Grandmother   . Thyroid disease Maternal Grandmother   . Cancer Maternal Grandfather     Social History   Tobacco Use  . Smoking status: Never Smoker  . Smokeless tobacco: Never Used  Substance Use Topics  . Alcohol use: No    Alcohol/week: 0.0 standard drinks  . Drug use: No    Home Medications Prior to Admission medications   Medication Sig Start Date End Date Taking? Authorizing Provider  alum & mag hydroxide-simeth (MAALOX ADVANCED MAX ST) 400-400-40 MG/5ML suspension Take 15 mLs by mouth every 6 (six) hours as needed for indigestion. 07/02/19  Yes Cardama, Grayce Sessions, MD  ASHWAGANDHA PO Take 1 capsule by mouth daily.    Yes [provider]  B-COMPLEX-C PO Take 1 capsule by mouth daily.    Yes [provider]  buPROPion (WELLBUTRIN XL) 150 MG 24 hr tablet TAKE THREE TABLETS EVERY MORNING. Patient taking differently: Take 450 mg by mouth in the morning.  04/03/19  Yes Mozingo, Berdie Ogren, NP  clobetasol ointment (TEMOVATE) 5.36 % Apply 1 application topically 2 (two) times daily. 06/15/19  Yes [provider]  etonogestrel (NEXPLANON) 68 MG IMPL implant 1 each by Subdermal route continuous.    Yes [provider]  ferrous sulfate 324 (65 Fe) MG TBEC Take 324 mg by mouth daily.  04/08/19  Yes [provider]  Hyoscyamine Sulfate SL (LEVSIN/SL) 0.125 MG SUBL Place 1 each under the tongue 4 (four)  times daily as needed for up to 5 days. Patient taking differently: Place 0.125 mg under the tongue 4 (four) times daily as needed.  07/02/19 07/07/19 Yes Cardama, Grayce Sessions, MD  insulin aspart (NOVOLOG FLEXPEN) 100 UNIT/ML FlexPen Inject 5-20 Units into the skin See admin instructions. Sliding scale: If blood sugar over 150, takes 5 units. Maximum 20 units.   Yes [provider]  Insulin Degludec (TRESIBA FLEXTOUCH) 100 UNIT/ML SOPN Inject 25 Units into the skin daily.  12/14/14  Yes [provider]  ondansetron (ZOFRAN-ODT) 4 MG disintegrating tablet Take 4 mg by mouth every 8 (eight) hours as needed for nausea or vomiting.  10/24/17  Yes [provider]  oxyCODONE (OXY IR/ROXICODONE) 5 MG immediate release tablet 5 mg every 6 (six) hours as needed for severe pain.  07/04/19  Yes [provider]  promethazine (PHENERGAN)  25 MG suppository Place 1 suppository (25 mg total) rectally every 6 (six) hours as needed for nausea or vomiting. 07/02/19  Yes Cardama, Amadeo Garnet, MD  Turmeric (RA TURMERIC) 500 MG CAPS Take 500 mg by mouth daily.    Yes [provider]  venlafaxine XR (EFFEXOR-XR) 75 MG 24 hr capsule Take 150 mg by mouth at bedtime.    Yes [provider]  Vitamin D, Ergocalciferol, (DRISDOL) 1.25 MG (50000 UNIT) CAPS capsule Take 50,000 Units by mouth once a week. 04/27/19  Yes [provider]  amphetamine-dextroamphetamine (ADDERALL) 20 MG tablet 1 po q am, 1/2 at noon. Patient not taking: Reported on 07/05/2019 05/30/19   Mozingo, Thereasa Solo, NP  dicyclomine (BENTYL) 20 MG tablet Take 1 tablet (20 mg total) by mouth 2 (two) times daily. 07/05/19   Mare Ferrari, PA-C  famotidine (PEPCID) 20 MG tablet Take 1 tablet (20 mg total) by mouth 2 (two) times daily. Patient not taking: Reported on 07/05/2019 10/26/17 06/27/19  Randel Pigg, Dorma Russell, MD  ibuprofen (ADVIL) 200 MG tablet You can drink 2 to 3 tablets every 6 hours as needed for  pain.  You can buy this over-the-counter at any drugstore. Patient not taking: Reported on 07/05/2019 06/30/19   Sherrie George, PA-C  lidocaine (XYLOCAINE) 2 % solution Use as directed 15 mLs in the mouth or throat every 6 (six) hours as needed for mouth pain. Patient not taking: Reported on 07/05/2019 07/02/19   Nira Conn, MD  prochlorperazine (COMPAZINE) 10 MG tablet Take 1 tablet (10 mg total) by mouth 2 (two) times daily as needed for nausea or vomiting. 07/05/19   Mare Ferrari, PA-C  traMADol (ULTRAM) 50 MG tablet Take 1 tablet (50 mg total) by mouth every 6 (six) hours as needed. Patient not taking: Reported on 07/05/2019 06/30/19   Sherrie George, PA-C    Allergies    Tapentadol, Aloe, Duloxetine, Gabapentin, Lyrica [pregabalin], Olive oil, and Zonisamide  Review of Systems   Review of Systems  Constitutional: Negative for chills and fever.  Respiratory: Negative for cough and shortness of breath.   Cardiovascular: Positive for chest pain.  Gastrointestinal: Positive for abdominal pain, nausea and vomiting.  Genitourinary: Positive for vaginal discharge. Negative for dysuria, flank pain, hematuria, pelvic pain, vaginal bleeding and vaginal pain.  Musculoskeletal: Negative for back pain.  Skin: Negative for color change and wound.  Neurological: Negative for dizziness, seizures, syncope, weakness, numbness and headaches.  Psychiatric/Behavioral: Negative for confusion.  All other systems reviewed and are negative.   Physical Exam Updated Vital Signs BP 98/63 (BP Location: Right Arm)   Pulse 97   Temp 99.1 F (37.3 C) (Oral)   Resp 16   SpO2 100%   Physical Exam Vitals and nursing note reviewed.  Constitutional:      General: She is not in acute distress.    Appearance: She is well-developed.  HENT:     Head: Normocephalic and atraumatic.  Eyes:     Extraocular Movements: Extraocular movements intact.     Conjunctiva/sclera: Conjunctivae normal.      Pupils: Pupils are equal, round, and reactive to light.  Cardiovascular:     Rate and Rhythm: Normal rate and regular rhythm.     Pulses:          Radial pulses are 2+ on the right side and 2+ on the left side.       Dorsalis pedis pulses are 2+ on the right side and 2+ on  the left side.     Heart sounds: Normal heart sounds. No murmur.  Pulmonary:     Effort: Pulmonary effort is normal. No respiratory distress.     Breath sounds: Normal breath sounds.  Chest:     Chest wall: Tenderness present.     Comments: Reproducible chest wall tenderness Abdominal:     Palpations: Abdomen is soft.     Tenderness: There is abdominal tenderness.     Comments: Tenderness to palpation in right and left upper quadrants, along with right lower quadrant.  Positive McBurney's.  Musculoskeletal:        General: Normal range of motion.     Cervical back: Normal range of motion and neck supple.     Right lower leg: No tenderness. No edema.     Left lower leg: No tenderness. No edema.  Skin:    General: Skin is warm and dry.     Comments: No evidence of erythema, fluctuance, drainage, dehiscence around incision sites  Neurological:     General: No focal deficit present.     Mental Status: She is alert.  Psychiatric:        Mood and Affect: Mood normal.        Behavior: Behavior normal.     ED Results / Procedures / Treatments   Labs (all labs ordered are listed, but only abnormal results are displayed) Labs Reviewed  BASIC METABOLIC PANEL - Abnormal; Notable for the following components:      Result Value   Sodium 132 (*)    Chloride 95 (*)    Glucose, Bld 295 (*)    Calcium 8.8 (*)    All other components within normal limits  URINALYSIS, ROUTINE W REFLEX MICROSCOPIC - Abnormal; Notable for the following components:   Glucose, UA >=500 (*)    Ketones, ur 80 (*)    Bacteria, UA RARE (*)    All other components within normal limits  CBG MONITORING, ED - Abnormal; Notable for the following  components:   Glucose-Capillary 194 (*)    All other components within normal limits  CBC  LIPASE, BLOOD  I-STAT BETA HCG BLOOD, ED ([facility], [facility], [facility] ONLY)  TROPONIN I (HIGH SENSITIVITY)  TROPONIN I (HIGH SENSITIVITY)    EKG EKG Interpretation  Date/Time:  Thursday July 05 2019 13:27:16 EDT Ventricular Rate:  98 PR Interval:  132 QRS Duration: 68 QT Interval:  358 QTC Calculation: 457 R Axis:   15 Text Interpretation: Normal sinus rhythm Normal ECG Confirmed by Marianna Fuss ([id number]) on 07/05/2019 4:22:27 PM   Radiology DG Chest 2 View  Result Date: 07/05/2019 CLINICAL DATA:  Chest pain EXAM: CHEST - 2 VIEW COMPARISON:  06/27/2019 FINDINGS: The heart size and mediastinal contours are within normal limits. No focal airspace consolidation, pleural effusion, or pneumothorax. The visualized skeletal structures are unremarkable. Cholecystectomy clips noted. IMPRESSION: No active cardiopulmonary disease. Electronically Signed   By: Duanne Guess D.O.   On: 07/05/2019 13:22   CT ABDOMEN PELVIS W CONTRAST  Result Date: 07/05/2019 CLINICAL DATA:  Abdominal pain, history of recent cholecystectomy, initial encounter EXAM: CT ABDOMEN AND PELVIS WITH CONTRAST TECHNIQUE: Multidetector CT imaging of the abdomen and pelvis was performed using the standard protocol following bolus administration of intravenous contrast. CONTRAST:  OMNIPAQUE IOHEXOL 300 MG/ML  SOLN COMPARISON:  07/02/2019 FINDINGS: Lower chest: Lung bases are free of acute infiltrate or sizable effusion. Hepatobiliary: Liver is within normal limits. A small amount of fluid is noted within the  surgical bed measuring 17 x 17 mm slightly more prominent than that seen on the prior exam. No findings to suggest superimposed infection are noted at this time. Common bile duct and biliary tree is mildly prominent consistent with the post cholecystectomy state. Pancreas: Unremarkable. No pancreatic ductal dilatation or surrounding  inflammatory changes. Spleen: Normal in size without focal abnormality. Adrenals/Urinary Tract: Adrenal glands are unremarkable. Kidneys are normal, without renal calculi, focal lesion, or hydronephrosis. Bladder is unremarkable. Stomach/Bowel: Colon shows no obstructive or inflammatory changes. The appendix is not well visualized. No inflammatory changes to suggest appendicitis are noted. Small bowel and stomach are within normal limits. Vascular/Lymphatic: Aortic atherosclerosis. No enlarged abdominal or pelvic lymph nodes. Reproductive: Uterus and bilateral adnexa are unremarkable. Other: No abdominal wall hernia or abnormality. No abdominopelvic ascites. Musculoskeletal: No acute or significant osseous findings. IMPRESSION: Minimal fluid collection in the gallbladder fossa consistent with the recent cholecystectomy. The collection is slightly more prominent than that seen on the prior exam. No findings of superimposed infection are noted. This may cause some mild localized discomfort however. No other focal abnormality is noted. Electronically Signed   By: Alcide Clever M.D.   On: 07/05/2019 19:15    Procedures Procedures (including critical care time)  Medications Ordered in ED Medications  prochlorperazine (COMPAZINE) tablet 5 mg (5 mg Oral Not Given 07/05/19 1745)  HYDROmorphone (DILAUDID) injection 2 mg (2 mg Intravenous Not Given 07/05/19 1745)  sodium chloride flush (NS) 0.9 % injection 3 mL (3 mLs Intravenous Given 07/05/19 1747)  sodium chloride 0.9 % bolus 1,000 mL (0 mLs Intravenous Stopped 07/05/19 1943)  metoCLOPramide (REGLAN) injection 10 mg (10 mg Intravenous Given 07/05/19 1733)  dicyclomine (BENTYL) injection 20 mg (20 mg Intramuscular Given 07/05/19 1724)  iohexol (OMNIPAQUE) 300 MG/ML solution 100 mL (100 mLs Intravenous Contrast Given 07/05/19 1856)    ED Course  I have reviewed the triage vital signs and the nursing notes.  Pertinent labs & imaging results that were available  during my care of the patient were reviewed by me and considered in my medical decision making (see chart for details).    MDM Rules/Calculators/A&P                      29 year old female s/p cholecystectomy 1 week ago presents with intractable nausea/vomiting, not tolerating p.o. over the last week and no bowel movement since the surgery.  On presentation, patient is alert and oriented, nontoxic-appearing, no acute distress, speaking in full sentences without increased work of breathing, nondiaphoretic, not actively vomiting in the ER.  Patient has able to drink some apple juice without emesis.  Physical exam positive for moderate diffuse nonlocalized upper abdominal and right lower quadrant tenderness.  Vitals overall reassuring.  Concern for possible appendicitis/ovarian torsion, postoperative ileus, DKA.  Suspicion for cardiac source/PE low, as the patient does not endorse any shortness of breath and the chest wall tenderness is reproducible.  Suspect possibly this is secondary to frequent vomiting.  BMP without significant electrode abnormalities, her glucose is 295 but normal anion gap.  Normal renal function.  CBC without leukocytosis, normal hemoglobin.  Initial troponin negative.  Pregnancy negative.  Lipase normal.  UA pending. Will start with CT of abdomen and then consult general surgery.  Patient treated with Compazine and Dilaudid, given 1 L fluid bolus in the ER.  UA positive for ketones and glucose, this appears to be consistent with her previous ED visits and I suspect the ketonuria is secondary  to dehydration.  Her repeat CBG is 194, anion gap not elevated.  Suspicion for DKA low at this time.   CT ABDOMEN FINDINGS:  IMPRESSION:  Minimal fluid collection in the gallbladder fossa consistent with  the recent cholecystectomy. The collection is slightly more  prominent than that seen on the prior exam. No findings of  superimposed infection are noted. This may cause some mild  localized  discomfort however.    No other focal abnormality is noted.   On reexamination, patient notes improvement in abdominal pain nausea, vomiting.  Patient without episode of emesis in the ER.  Tolerated p.o. fluid challenge.  No evidence of DKA.  I do not think that a pelvic/possible torsion examination is warranted at this time, suspect her right lower quadrant pain is consistent with the setting of consistent nausea and vomiting.  CT scan did not show any signs of appendicitis.  Upon further discussion with the patient and her mother, the patient states that she has having ongoing problems with intractable nausea and vomiting, with multiple hospitalizations in the past prior to the gallbladder surgery.  They were hoping that removing the gallbladder would help alleviate her symptoms.  She notes that she does have a GI doctor whom she follows with.  I stressed close follow-up within within the week, as we do not have any direct explanations at this time for her nausea vomiting and abdominal pain.  Patient overall well-appearing at discharge.  She was able to drink half of a glass of apple juice with no episodes of vomiting.  Will D/C her with Bentyl, Compazine (patient request this medication as she states that this has been the most helpful) and stressed follow-up with GI.  Return precautions given.  At this stage in the ED course, the patient has been adequately screened and is stable for discharge.  The patient was seen and evaluated by Dr. Gar Gibbonexter and he is agreeable to the above plan.  Final Clinical Impression(s) / ED Diagnoses Final diagnoses:  Nausea and vomiting, intractability of vomiting not specified, unspecified vomiting type  Epigastric pain    Rx / DC Orders ED Discharge Orders         Ordered    prochlorperazine (COMPAZINE) 10 MG tablet  2 times daily PRN     07/05/19 2115    dicyclomine (BENTYL) 20 MG tablet  2 times daily     07/05/19 2115           Leone BrandBelaya, Pervis Macintyre  A, PA-C 07/05/19 2132    Milagros Lollykstra, Richard S, MD 07/07/19 1148

## 2019-07-05 NOTE — ED Triage Notes (Signed)
Patient c/o NV and dehydration. Stated she had her gallbladder removed last wenesday. Pt c/o chest pain as well started since the surgery.

## 2019-07-05 NOTE — ED Notes (Signed)
Patient verbalizes understanding of discharge instructions. Opportunity for questioning and answers were provided. Armband removed by staff, pt discharged from ED to home with aunt

## 2019-07-09 ENCOUNTER — Other Ambulatory Visit: Payer: Self-pay

## 2019-07-09 ENCOUNTER — Telehealth: Payer: Self-pay | Admitting: Adult Health

## 2019-07-09 DIAGNOSIS — F908 Attention-deficit hyperactivity disorder, other type: Secondary | ICD-10-CM

## 2019-07-09 MED ORDER — AMPHETAMINE-DEXTROAMPHETAMINE 20 MG PO TABS: ORAL_TABLET | ORAL | 0 refills | Status: DC

## 2019-07-09 NOTE — Telephone Encounter (Signed)
Patient left a message stating that she needs a refill on her adderall to be sent to the [facility] on [address]. Next appt 5/6

## 2019-07-09 NOTE — Telephone Encounter (Signed)
Last refill 05/30/2019, pended for Theresa [name] to submit Has apt 07/12/2019

## 2019-07-10 ENCOUNTER — Telehealth: Payer: Self-pay | Admitting: Adult Health

## 2019-07-10 NOTE — Telephone Encounter (Signed)
This was submitted yesterday!

## 2019-07-10 NOTE — Telephone Encounter (Signed)
Needs refill on ADDERALL 20 MG. Please send to [facility] on [address].

## 2019-07-12 ENCOUNTER — Ambulatory Visit (INDEPENDENT_AMBULATORY_CARE_PROVIDER_SITE_OTHER): Payer: BC Managed Care – PPO | Admitting: Adult Health

## 2019-07-12 ENCOUNTER — Encounter: Payer: Self-pay | Admitting: Adult Health

## 2019-07-12 ENCOUNTER — Other Ambulatory Visit: Payer: Self-pay

## 2019-07-12 ENCOUNTER — Ambulatory Visit: Payer: BC Managed Care – PPO | Admitting: Adult Health

## 2019-07-12 DIAGNOSIS — F988 Other specified behavioral and emotional disorders with onset usually occurring in childhood and adolescence: Secondary | ICD-10-CM

## 2019-07-12 DIAGNOSIS — F411 Generalized anxiety disorder: Secondary | ICD-10-CM

## 2019-07-12 DIAGNOSIS — F908 Attention-deficit hyperactivity disorder, other type: Secondary | ICD-10-CM | POA: Diagnosis not present

## 2019-07-12 DIAGNOSIS — F331 Major depressive disorder, recurrent, moderate: Secondary | ICD-10-CM | POA: Diagnosis not present

## 2019-07-12 DIAGNOSIS — G4700 Insomnia, unspecified: Secondary | ICD-10-CM | POA: Diagnosis not present

## 2019-07-12 MED ORDER — AMPHETAMINE-DEXTROAMPHETAMINE 20 MG PO TABS: ORAL_TABLET | ORAL | 0 refills | Status: DC

## 2019-07-12 NOTE — Progress Notes (Signed)
Theresa [name] [id number] Feb 06, 1991 29 y.o.  Subjective:   Patient ID:  Theresa [name] is a 29 y.o. (DOB 03/20/1990) female.  Chief Complaint: No chief complaint on file.   HPI Theresa [name] presents to the office today for follow-up of anxiety, depression, ADHD, and insomnia.   Describes mood today as "ok". Pleasant. Flat. Mood symptoms - reports decreased depression, anxiety, and irritability. Stating "everything is alright". Wondering if she needs to continue Wellbutrin. Stating "I have the interest and motivation, just not the energy". Stating "I have not seen a real benefit form the Wellbutrin". Would like to continue Adderall and Effexor. Recent gallbladder surgery - 2 weeks ago. Stayed in hospital for a week. Stating "I'm much better now". Denies any recent Fibromyalgia "flare-ups". Stable interest and motivation. Taking other medications as prescribed.  Energy levels "low". Active, has a regular exercise routine. 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 Enjoys some usual interests and activities. Single. Lives alone with dog - "[name]". Spending time with family - all local. Aunt lives closet to her.  Appetite decreased over past month. Weight loss - 5 pounds. Sleeps well most nights. Averages 5 to 8 hours. Focus and concentration stable with Adderall. Completing tasks. 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 Denies AH or VH.  Past medications for mental health diagnoses include: Elavil, Cymbalta, gabapentin caused swelling in her lower extremities, Horizant, Topamax, baclofen, Lunesta, Belsomra was effective, trazodone caused increased glucose sonar caused increased glucose, Modafinildid not help.  Allergies: Aloe, Duloxetine, Gabapentin, Olive oil, Lyrica [pregabalin], Tapentadol, and Zonisamide   PHQ2-9     Nutrition from 11/24/2016 [facility] 08/24/2016 [facility] Nutrition from 07/20/2016 in [facility] Nutrition from 01/15/2014 in [facility]  PHQ-2 Total Score  0  0  0  0       Review of Systems:  Review of Systems  Musculoskeletal: Negative for gait problem.  Neurological: Negative for tremors.  Psychiatric/Behavioral:       Please refer to HPI    Medications: I have reviewed the patient's current medications.  Current Outpatient Medications  Medication Sig Dispense Refill  . alum & mag hydroxide-simeth (MAALOX ADVANCED MAX ST) 400-400-40 MG/5ML suspension Take 15 mLs by mouth every 6 (six) hours as needed for indigestion. 355 mL 0  . amphetamine-dextroamphetamine (ADDERALL) 20 MG tablet 1 po q am, 1/2 at noon. 45 tablet 0  . ASHWAGANDHA PO Take 1 capsule by mouth daily.     . B-COMPLEX-C PO Take 1 capsule by mouth daily.     Marland Kitchen buPROPion (WELLBUTRIN XL) 150 MG 24 hr tablet TAKE THREE TABLETS EVERY MORNING. (Patient taking differently: Take 450 mg by mouth in the morning. ) 270 tablet 0  . clobetasol ointment (TEMOVATE) 0.05 % Apply 1 application topically 2 (two) times daily.    Marland Kitchen dicyclomine (BENTYL) 20 MG tablet Take 1 tablet (20 mg total) by mouth 2 (two) times daily. 20 tablet 0  . etonogestrel (NEXPLANON) 68 MG IMPL implant 1 each by Subdermal route continuous.     . famotidine (PEPCID) 20 MG tablet Take 1 tablet (20 mg total) by mouth 2 (two) times daily. (Patient not taking: Reported on 07/05/2019) 60 tablet 0  . ferrous sulfate 324 (65 Fe) MG TBEC Take 324 mg by mouth daily.     Marland Kitchen Hyoscyamine Sulfate SL (LEVSIN/SL) 0.125 MG SUBL Place  1 each under the tongue 4 (four) times daily as needed for up to 5 days. (Patient taking differently: Place 0.125 mg under the tongue 4 (four) times daily as needed. ) 30 tablet 0  . ibuprofen (ADVIL) 200 MG tablet You can drink 2 to 3 tablets every 6 hours as needed for pain.  You can buy this over-the-counter at any drugstore.  (Patient not taking: Reported on 07/05/2019)    . insulin aspart (NOVOLOG FLEXPEN) 100 UNIT/ML FlexPen Inject 5-20 Units into the skin See admin instructions. Sliding scale: If blood sugar over 150, takes 5 units. Maximum 20 units.    . Insulin Degludec (TRESIBA FLEXTOUCH) 100 UNIT/ML SOPN Inject 25 Units into the skin daily.     Marland Kitchen lidocaine (XYLOCAINE) 2 % solution Use as directed 15 mLs in the mouth or throat every 6 (six) hours as needed for mouth pain. (Patient not taking: Reported on 07/05/2019) 150 mL 0  . ondansetron (ZOFRAN-ODT) 4 MG disintegrating tablet Take 4 mg by mouth every 8 (eight) hours as needed for nausea or vomiting.   0  . oxyCODONE (OXY IR/ROXICODONE) 5 MG immediate release tablet 5 mg every 6 (six) hours as needed for severe pain.     Marland Kitchen prochlorperazine (COMPAZINE) 10 MG tablet Take 1 tablet (10 mg total) by mouth 2 (two) times daily as needed for nausea or vomiting. 10 tablet 0  . promethazine (PHENERGAN) 25 MG suppository Place 1 suppository (25 mg total) rectally every 6 (six) hours as needed for nausea or vomiting. 12 each 0  . traMADol (ULTRAM) 50 MG tablet Take 1 tablet (50 mg total) by mouth every 6 (six) hours as needed. (Patient not taking: Reported on 07/05/2019) 15 tablet 0  . Turmeric (RA TURMERIC) 500 MG CAPS Take 500 mg by mouth daily.     Marland Kitchen venlafaxine XR (EFFEXOR-XR) 75 MG 24 hr capsule Take 150 mg by mouth at bedtime.     . Vitamin D, Ergocalciferol, (DRISDOL) 1.25 MG (50000 UNIT) CAPS capsule Take 50,000 Units by mouth once a week.     No current facility-administered medications for this visit.    Medication Side Effects: None  Allergies:  Allergies  Allergen Reactions  . Tapentadol Swelling  . Aloe Hives  . Duloxetine Other (See Comments)    RESTLESSNESS URGES TO MOVE  . Gabapentin Swelling    SWELLING REACTION OF FEET  . Lyrica [Pregabalin] Other (See Comments)    Heart palpitations Fainting  . Olive Oil Hives  . Zonisamide Other (See Comments)     WORSENING PAIN    Past Medical History:  Diagnosis Date  . Fibromyalgia   . Fibromyalgia 06/28/2019  . Headache   . Insomnia   . Insulin dependent type 1 diabetes mellitus (HCC)   . 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  Recommend follow-up Pap smear/HPV at next annual exam.  . Peripheral neuropathy   . Seasonal allergies   . 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 Diabetes Mother   . Migraines Mother   . Hypertension Mother   . CVA Mother   . Breast cancer Maternal Grandmother   . Thyroid disease Maternal Grandmother   . Cancer Maternal Grandfather     Social History   Socioeconomic History  . Marital status: Single    Spouse name: Not on file  . Number  of children: Not on file  . Years of education: Not on file  . Highest education level: Not on file  Occupational History  . Not on file  Tobacco Use  . Smoking status: Never Smoker  . Smokeless tobacco: Never Used  Substance and Sexual Activity  . Alcohol use: No    Alcohol/week: 0.0 standard drinks  . Drug use: No  . Sexual activity: Yes    Birth control/protection: Implant    Comment: 1ST INTERCOURSE- 58, PARTNERS - 7 . Nexplanon 06/27/2018  Other Topics Concern  . Not on file  Social History Narrative   Student.  Not married.  Lives alone in a one story home.   Not working.   Social Determinants of Health   Financial Resource Strain:   . Difficulty of Paying Living Expenses:   Food Insecurity:   . Worried About Charity fundraiser in the Last Year:   . Arboriculturist in the Last Year:   Transportation Needs:   . Film/video editor (Medical):   Marland Kitchen Lack of Transportation (Non-Medical):   Physical Activity:   . Days of Exercise per Week:   . Minutes of Exercise per Session:   Stress:   . Feeling of Stress :   Social Connections:   . Frequency of  Communication with Friends and Family:   . Frequency of Social Gatherings with Friends and Family:   . Attends Religious Services:   . Active Member of Clubs or Organizations:   . Attends Archivist Meetings:   Marland Kitchen Marital Status:   Intimate Partner Violence:   . Fear of Current or Ex-Partner:   . Emotionally Abused:   Marland Kitchen Physically Abused:   . Sexually Abused:     Past Medical History, Surgical history, Social history, and Family history were reviewed and updated as appropriate.   Please see review of systems for further details on the patient's review from today.   Objective:   Physical Exam:  There were no vitals taken for this visit.  Physical Exam Constitutional:      General: She is not in acute distress. Musculoskeletal:        General: No deformity.  Neurological:     Mental Status: She is alert and oriented to person, place, and time.     Coordination: Coordination normal.  Psychiatric:        Attention and Perception: Attention and perception normal. She does not perceive auditory or visual hallucinations.        Mood and Affect: Mood normal. Mood is not anxious or depressed. Affect is not labile, blunt, angry or inappropriate.        Speech: Speech normal.        Behavior: Behavior normal.        Thought Content: Thought content normal. Thought content is not paranoid or delusional. Thought content does not include homicidal or suicidal ideation. Thought content does not include homicidal or suicidal plan.        Cognition and Memory: Cognition and memory normal.        Judgment: Judgment normal.     Comments: Insight intact     Lab Review:     Component Value Date/Time   NA 132 (L) 07/05/2019 1254   K 4.8 07/05/2019 1254   CL 95 (L) 07/05/2019 1254   CO2 25 07/05/2019 1254   GLUCOSE 295 (H) 07/05/2019 1254   BUN 10 07/05/2019 1254   CREATININE 0.98 07/05/2019 1254  CREATININE 0.55 12/25/2013 1455   CALCIUM 8.8 (L) 07/05/2019 1254   PROT 6.3  (L) 07/01/2019 2142   ALBUMIN 3.0 (L) 07/01/2019 2142   AST 41 07/01/2019 2142   ALT 250 (H) 07/01/2019 2142   ALKPHOS 277 (H) 07/01/2019 2142   BILITOT 1.1 07/01/2019 2142   GFRNONAA >60 07/05/2019 1254   GFRAA >60 07/05/2019 1254       Component Value Date/Time   WBC 10.3 07/05/2019 1254   RBC 3.96 07/05/2019 1254   HGB 12.2 07/05/2019 1254   HCT 37.6 07/05/2019 1254   PLT 378 07/05/2019 1254   MCV 94.9 07/05/2019 1254   MCV 97.7 (A) 05/24/2013 2021   MCH 30.8 07/05/2019 1254   MCHC 32.4 07/05/2019 1254   RDW 13.2 07/05/2019 1254   LYMPHSABS 3.4 10/26/2017 0607   MONOABS 0.9 10/26/2017 0607   EOSABS 0.1 10/26/2017 0607   BASOSABS 0.1 10/26/2017 0607    No results found for: POCLITH, LITHIUM   No results found for: PHENYTOIN, PHENOBARB, VALPROATE, CBMZ   .res Assessment: Plan:    Plan:  Adderall 20 mg - 1 and 1/2 every morning  Continue NAC 600 mg 1 twice daily. Continue Effexor XR 75 mg, 2 p.o. every morning. Discontinue Wellbutrin XL 450mg  daily - 300mg  daily x 7 days, then 150mg  daily x 7 days, then d/c - denies seizure history.  RTC 4 weeks  Discussed potential benefits, risks, and side effects of stimulants with patient to include increased heart rate, palpitations, insomnia, increased anxiety, increased irritability, or decreased appetite.  Instructed patient to contact office if experiencing any significant tolerability issues.   Diagnoses and all orders for this visit:  Major depressive disorder, recurrent episode, moderate (HCC)  Attention deficit hyperactivity disorder (ADHD), other type -     Discontinue: amphetamine-dextroamphetamine (ADDERALL) 20 MG tablet; 1 po q am, 1/2 at noon. -     amphetamine-dextroamphetamine (ADDERALL) 20 MG tablet; 1 po q am, 1/2 at noon.  Generalized anxiety disorder  Insomnia, unspecified type  Attention deficit disorder, unspecified hyperactivity presence     Please see After Visit Summary for patient specific  instructions.  Future Appointments  Date Time Provider Department Center  07/18/2019  2:00 PM , MD [facility] None    No orders of the defined types were placed in this encounter.   -------------------------------

## 2019-07-18 ENCOUNTER — Encounter (INDEPENDENT_AMBULATORY_CARE_PROVIDER_SITE_OTHER): Payer: BC Managed Care – PPO | Admitting: Ophthalmology

## 2019-07-18 ENCOUNTER — Other Ambulatory Visit: Payer: Self-pay | Admitting: Adult Health

## 2019-07-18 DIAGNOSIS — F331 Major depressive disorder, recurrent, moderate: Secondary | ICD-10-CM

## 2019-07-18 DIAGNOSIS — F411 Generalized anxiety disorder: Secondary | ICD-10-CM

## 2019-07-18 DIAGNOSIS — F908 Attention-deficit hyperactivity disorder, other type: Secondary | ICD-10-CM

## 2019-08-01 ENCOUNTER — Telehealth: Payer: Self-pay | Admitting: Adult Health

## 2019-08-01 ENCOUNTER — Other Ambulatory Visit: Payer: Self-pay

## 2019-08-01 DIAGNOSIS — F908 Attention-deficit hyperactivity disorder, other type: Secondary | ICD-10-CM

## 2019-08-01 MED ORDER — AMPHETAMINE-DEXTROAMPHETAMINE 20 MG PO TABS: ORAL_TABLET | ORAL | 0 refills | Status: DC

## 2019-08-01 NOTE — Telephone Encounter (Signed)
Pt requesting a refill on her Adderall. Fill at the [facility] on [address]. Next appt scheduled for 6/3.

## 2019-08-01 NOTE — Telephone Encounter (Signed)
Last refill 07/09/2019, pended for Almira Coaster to review and submit

## 2019-08-09 ENCOUNTER — Other Ambulatory Visit: Payer: Self-pay

## 2019-08-09 ENCOUNTER — Encounter: Payer: Self-pay | Admitting: Adult Health

## 2019-08-09 ENCOUNTER — Ambulatory Visit (INDEPENDENT_AMBULATORY_CARE_PROVIDER_SITE_OTHER): Payer: BC Managed Care – PPO | Admitting: Adult Health

## 2019-08-09 DIAGNOSIS — F331 Major depressive disorder, recurrent, moderate: Secondary | ICD-10-CM | POA: Diagnosis not present

## 2019-08-09 DIAGNOSIS — G4700 Insomnia, unspecified: Secondary | ICD-10-CM | POA: Diagnosis not present

## 2019-08-09 DIAGNOSIS — F411 Generalized anxiety disorder: Secondary | ICD-10-CM

## 2019-08-09 DIAGNOSIS — F908 Attention-deficit hyperactivity disorder, other type: Secondary | ICD-10-CM

## 2019-08-09 MED ORDER — AMPHETAMINE-DEXTROAMPHETAMINE 20 MG PO TABS: ORAL_TABLET | ORAL | 0 refills | Status: DC

## 2019-08-09 NOTE — Progress Notes (Signed)
Theresa [name] [id number] 10-Mar-1990 29 y.o.  Subjective:   Patient ID:  Theresa [name] is a 29 y.o. (DOB 1990-03-09) female.  Chief Complaint: No chief complaint on file.   HPI Theresa [name] presents to the office today for follow-up of anxiety, depression, ADHD, and insomnia.   Describes mood today as "ok". Pleasant. Flat. Mood symptoms - reports "struggling" with depression and anxiety. Denies irritability. Denies mood swings. Has tapered down on Wellbutrin to 150mg  daily and will be stopping it this week. Has not been able to tell a difference in mood tapering off it. Feels "tired and fatigued" from Fibromyalgia. Stating "I want to do things, I'm just too tired". Gets depressed about health issues, not getting out, day to day schedule is not routine and it hinders her "outlook".  Has recovered from gallbladder surgery. Take iron tablets - not taking consistently. Stayed in hospital for a week. Stating "I'm much better now". Reports Fibromyalgia "flare-up" since last visit. Stable interest and motivation -"I just don't have the energy". Taking other medications as prescribed.  Energy levels "about the same"- "low". Active, slowly starting to exercise again. Works from home - "when I can".  Enjoys some usual interests and activities. Single. Lives alone with dog - "[name]". Has a good relationship with her - a few houses up from here. She and other family members with a "falling out" over Covid. Appetite decreased. Having GI issues. Weight stable. Sleeps is not consistent. Averages 5 to 8 hours. Focus and concentration stable with Adderall - "I'm getting things done". Completing tasks. 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 Denies AH or VH.  Past medications for mental health diagnoses include: Elavil, Cymbalta, gabapentin caused swelling in her lower extremities, Horizant, Topamax, baclofen, Lunesta, Belsomra was effective, trazodone caused  increased glucose sonar caused increased glucose, Modafinildid not help, Wellbutrin.  Allergies: Aloe, Duloxetine, Gabapentin, Olive oil, Lyrica [pregabalin], Tapentadol, and Zonisamide   PHQ2-9     Nutrition from 11/24/2016 [facility] Nutrition from 08/24/2016 in [facility] Nutrition from 07/20/2016 in [facility] Nutrition from 01/15/2014 in [facility]  PHQ-2 Total Score  0  0  0  0       Review of Systems:  Review of Systems  Musculoskeletal: Negative for gait problem.  Neurological: Negative for tremors.  Psychiatric/Behavioral:       Please refer to HPI    Medications: I have reviewed the patient's current medications.  Current Outpatient Medications  Medication Sig Dispense Refill  . alum & mag hydroxide-simeth (MAALOX ADVANCED MAX ST) 400-400-40 MG/5ML suspension Take 15 mLs by mouth every 6 (six) hours as needed for indigestion. 355 mL 0  . amphetamine-dextroamphetamine (ADDERALL) 20 MG tablet Take one tablet twice daily. 60 tablet 0  . ASHWAGANDHA PO Take 1 capsule by mouth daily.     . B-COMPLEX-C PO Take 1 capsule by mouth daily.     13/12/2013 buPROPion (WELLBUTRIN XL) 150 MG 24 hr tablet TAKE THREE TABLETS EVERY MORNING. (Patient taking differently: Take 450 mg by mouth in the morning. ) 270 tablet 0  . clobetasol ointment (TEMOVATE) 0.05 % Apply 1 application topically 2 (two) times daily.    Marland Kitchen dicyclomine (BENTYL) 20 MG tablet Take 1 tablet (20 mg total) by mouth 2 (two) times daily. 20 tablet 0  . etonogestrel (NEXPLANON) 68 MG IMPL implant 1 each by Subdermal route continuous.     Marland Kitchen  famotidine (PEPCID) 20 MG tablet Take 1 tablet (20 mg total) by mouth 2 (two) times daily. (Patient not taking: Reported on 07/05/2019) 60 tablet 0  . ferrous sulfate 324 (65 Fe) MG TBEC Take 324 mg by mouth daily.     Marland Kitchen Hyoscyamine Sulfate SL (LEVSIN/SL) 0.125 MG SUBL Place 1  each under the tongue 4 (four) times daily as needed for up to 5 days. (Patient taking differently: Place 0.125 mg under the tongue 4 (four) times daily as needed. ) 30 tablet 0  . ibuprofen (ADVIL) 200 MG tablet You can drink 2 to 3 tablets every 6 hours as needed for pain.  You can buy this over-the-counter at any drugstore. (Patient not taking: Reported on 07/05/2019)    . insulin aspart (NOVOLOG FLEXPEN) 100 UNIT/ML FlexPen Inject 5-20 Units into the skin See admin instructions. Sliding scale: If blood sugar over 150, takes 5 units. Maximum 20 units.    . Insulin Degludec (TRESIBA FLEXTOUCH) 100 UNIT/ML SOPN Inject 25 Units into the skin daily.     Marland Kitchen lidocaine (XYLOCAINE) 2 % solution Use as directed 15 mLs in the mouth or throat every 6 (six) hours as needed for mouth pain. (Patient not taking: Reported on 07/05/2019) 150 mL 0  . ondansetron (ZOFRAN-ODT) 4 MG disintegrating tablet Take 4 mg by mouth every 8 (eight) hours as needed for nausea or vomiting.   0  . oxyCODONE (OXY IR/ROXICODONE) 5 MG immediate release tablet 5 mg every 6 (six) hours as needed for severe pain.     Marland Kitchen prochlorperazine (COMPAZINE) 10 MG tablet Take 1 tablet (10 mg total) by mouth 2 (two) times daily as needed for nausea or vomiting. 10 tablet 0  . promethazine (PHENERGAN) 25 MG suppository Place 1 suppository (25 mg total) rectally every 6 (six) hours as needed for nausea or vomiting. 12 each 0  . traMADol (ULTRAM) 50 MG tablet Take 1 tablet (50 mg total) by mouth every 6 (six) hours as needed. (Patient not taking: Reported on 07/05/2019) 15 tablet 0  . Turmeric (RA TURMERIC) 500 MG CAPS Take 500 mg by mouth daily.     Marland Kitchen venlafaxine XR (EFFEXOR-XR) 75 MG 24 hr capsule Take 150 mg by mouth at bedtime.     . Vitamin D, Ergocalciferol, (DRISDOL) 1.25 MG (50000 UNIT) CAPS capsule Take 50,000 Units by mouth once a week.     No current facility-administered medications for this visit.    Medication Side Effects:  None  Allergies:  Allergies  Allergen Reactions  . Tapentadol Swelling  . Aloe Hives  . Duloxetine Other (See Comments)    RESTLESSNESS URGES TO MOVE  . Gabapentin Swelling    SWELLING REACTION OF FEET  . Lyrica [Pregabalin] Other (See Comments)    Heart palpitations Fainting  . Olive Oil Hives  . Zonisamide Other (See Comments)    WORSENING PAIN    Past Medical History:  Diagnosis Date  . Fibromyalgia   . Fibromyalgia 06/28/2019  . Headache   . Insomnia   . Insulin dependent type 1 diabetes mellitus (HCC)   . 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  Recommend follow-up Pap smear/HPV at next annual exam.  . Peripheral neuropathy   . Seasonal allergies   . 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 Diabetes Mother   . Migraines Mother   .  Hypertension Mother   . CVA Mother   . Breast cancer Maternal Grandmother   . Thyroid disease Maternal Grandmother   . Cancer Maternal Grandfather     Social History   Socioeconomic History  . Marital status: Single    Spouse name: Not on file  . Number of children: Not on file  . Years of education: Not on file  . Highest education level: Not on file  Occupational History  . Not on file  Tobacco Use  . Smoking status: Never Smoker  . Smokeless tobacco: Never Used  Substance and Sexual Activity  . Alcohol use: No    Alcohol/week: 0.0 standard drinks  . Drug use: No  . Sexual activity: Yes    Birth control/protection: Implant    Comment: 1ST INTERCOURSE- 59, PARTNERS - 7 . Nexplanon 06/27/2018  Other Topics Concern  . Not on file  Social History Narrative   Student.  Not married.  Lives alone in a one story home.   Not working.   Social Determinants of Health   Financial Resource Strain:   . Difficulty of Paying Living Expenses:   Food Insecurity:   . Worried About Paediatric nurse in the Last Year:   . Arboriculturist in the Last Year:   Transportation Needs:   . Film/video editor (Medical):   Marland Kitchen Lack of Transportation (Non-Medical):   Physical Activity:   . Days of Exercise per Week:   . Minutes of Exercise per Session:   Stress:   . Feeling of Stress :   Social Connections:   . Frequency of Communication with Friends and Family:   . Frequency of Social Gatherings with Friends and Family:   . Attends Religious Services:   . Active Member of Clubs or Organizations:   . Attends Archivist Meetings:   Marland Kitchen Marital Status:   Intimate Partner Violence:   . Fear of Current or Ex-Partner:   . Emotionally Abused:   Marland Kitchen Physically Abused:   . Sexually Abused:     Past Medical History, Surgical history, Social history, and Family history were reviewed and updated as appropriate.   Please see review of systems for further details on the patient's review from today.   Objective:   Physical Exam:  There were no vitals taken for this visit.  Physical Exam Constitutional:      General: She is not in acute distress. Musculoskeletal:        General: No deformity.  Neurological:     Mental Status: She is alert and oriented to person, place, and time.     Coordination: Coordination normal.  Psychiatric:        Attention and Perception: Attention and perception normal. She does not perceive auditory or visual hallucinations.        Mood and Affect: Mood is anxious and depressed. Affect is not labile, blunt, angry or inappropriate.        Speech: Speech normal.        Behavior: Behavior normal.        Thought Content: Thought content normal. Thought content is not paranoid or delusional. Thought content does not include homicidal or suicidal ideation. Thought content does not include homicidal or suicidal plan.        Cognition and Memory: Cognition and memory normal.        Judgment: Judgment normal.     Comments: Insight intact     Lab  Review:  Component Value Date/Time   NA 132 (L) 07/05/2019 1254   K 4.8 07/05/2019 1254   CL 95 (L) 07/05/2019 1254   CO2 25 07/05/2019 1254   GLUCOSE 295 (H) 07/05/2019 1254   BUN 10 07/05/2019 1254   CREATININE 0.98 07/05/2019 1254   CREATININE 0.55 12/25/2013 1455   CALCIUM 8.8 (L) 07/05/2019 1254   PROT 6.3 (L) 07/01/2019 2142   ALBUMIN 3.0 (L) 07/01/2019 2142   AST 41 07/01/2019 2142   ALT 250 (H) 07/01/2019 2142   ALKPHOS 277 (H) 07/01/2019 2142   BILITOT 1.1 07/01/2019 2142   GFRNONAA >60 07/05/2019 1254   GFRAA >60 07/05/2019 1254       Component Value Date/Time   WBC 10.3 07/05/2019 1254   RBC 3.96 07/05/2019 1254   HGB 12.2 07/05/2019 1254   HCT 37.6 07/05/2019 1254   PLT 378 07/05/2019 1254   MCV 94.9 07/05/2019 1254   MCV 97.7 (A) 05/24/2013 2021   MCH 30.8 07/05/2019 1254   MCHC 32.4 07/05/2019 1254   RDW 13.2 07/05/2019 1254   LYMPHSABS 3.4 10/26/2017 0607   MONOABS 0.9 10/26/2017 0607   EOSABS 0.1 10/26/2017 0607   BASOSABS 0.1 10/26/2017 0607    No results found for: POCLITH, LITHIUM   No results found for: PHENYTOIN, PHENOBARB, VALPROATE, CBMZ   .res Assessment: Plan:    Plan:  Adderall 20 mg - 1 and 1/2 every morning to BID NAC 600 mg 1 twice daily. Effexor XR 75 mg, 2 p.o. every morning.  Discontinue Wellbutrin XL 150mg  daily - denies seizure history.  RTC 4 weeks  Discussed potential benefits, risks, and side effects of stimulants with patient to include increased heart rate, palpitations, insomnia, increased anxiety, increased irritability, or decreased appetite.  Instructed patient to contact office if experiencing any significant tolerability issues.   Diagnoses and all orders for this visit:  Major depressive disorder, recurrent episode, moderate (HCC)  Attention deficit hyperactivity disorder (ADHD), other type -     amphetamine-dextroamphetamine (ADDERALL) 20 MG tablet; Take one tablet twice daily.  Generalized anxiety  disorder  Insomnia, unspecified type     Please see After Visit Summary for patient specific instructions.  Future Appointments  Date Time Provider Department Center  08/10/2019  9:00 AM 10/10/2019, MD [facility] None    No orders of the defined types were placed in this encounter.   -------------------------------

## 2019-08-10 ENCOUNTER — Encounter (INDEPENDENT_AMBULATORY_CARE_PROVIDER_SITE_OTHER): Payer: BC Managed Care – PPO | Admitting: Ophthalmology

## 2019-08-13 ENCOUNTER — Encounter (INDEPENDENT_AMBULATORY_CARE_PROVIDER_SITE_OTHER): Payer: BC Managed Care – PPO | Admitting: Ophthalmology

## 2019-08-17 ENCOUNTER — Other Ambulatory Visit: Payer: Self-pay

## 2019-08-17 ENCOUNTER — Encounter (INDEPENDENT_AMBULATORY_CARE_PROVIDER_SITE_OTHER): Payer: BC Managed Care – PPO | Admitting: Ophthalmology

## 2019-08-17 DIAGNOSIS — H35372 Puckering of macula, left eye: Secondary | ICD-10-CM | POA: Diagnosis not present

## 2019-08-17 DIAGNOSIS — E103593 Type 1 diabetes mellitus with proliferative diabetic retinopathy without macular edema, bilateral: Secondary | ICD-10-CM

## 2019-08-17 DIAGNOSIS — E10319 Type 1 diabetes mellitus with unspecified diabetic retinopathy without macular edema: Secondary | ICD-10-CM | POA: Diagnosis not present

## 2019-08-17 DIAGNOSIS — H43813 Vitreous degeneration, bilateral: Secondary | ICD-10-CM | POA: Diagnosis not present

## 2019-08-24 ENCOUNTER — Telehealth: Payer: Self-pay | Admitting: Adult Health

## 2019-08-24 NOTE — Telephone Encounter (Signed)
Pt requesting a refill on her Adderall. She stated the dosage was increased on her last visit that's why she is out at this time.Theresa [name] at the [facility] on [address]. She is scheduled for a follow up appt on 7/7.

## 2019-08-24 NOTE — Telephone Encounter (Signed)
Patient already has updated Rx for Adderall 20 mg bid at her pharmacy

## 2019-09-12 ENCOUNTER — Ambulatory Visit: Payer: BC Managed Care – PPO | Admitting: Adult Health

## 2019-10-08 ENCOUNTER — Ambulatory Visit (INDEPENDENT_AMBULATORY_CARE_PROVIDER_SITE_OTHER): Payer: BC Managed Care – PPO | Admitting: Adult Health

## 2019-10-08 ENCOUNTER — Other Ambulatory Visit: Payer: Self-pay

## 2019-10-08 ENCOUNTER — Encounter: Payer: Self-pay | Admitting: Adult Health

## 2019-10-08 DIAGNOSIS — G4700 Insomnia, unspecified: Secondary | ICD-10-CM | POA: Diagnosis not present

## 2019-10-08 DIAGNOSIS — F908 Attention-deficit hyperactivity disorder, other type: Secondary | ICD-10-CM

## 2019-10-08 DIAGNOSIS — F411 Generalized anxiety disorder: Secondary | ICD-10-CM | POA: Diagnosis not present

## 2019-10-08 DIAGNOSIS — F331 Major depressive disorder, recurrent, moderate: Secondary | ICD-10-CM | POA: Diagnosis not present

## 2019-10-08 MED ORDER — MODAFINIL 200 MG PO TABS: 200.0000 mg | ORAL_TABLET | Freq: Every day | ORAL | 2 refills | Status: DC

## 2019-10-08 NOTE — Progress Notes (Signed)
Theresa [name] [id number] 1990-09-30 29 y.o.  Subjective:   Patient ID:  Theresa [name] is a 29 y.o. (DOB Apr 29, 1990) female.  Chief Complaint: No chief complaint on file.   HPI [name] presents to the office today for follow-up of anxiety, depression, ADHD, and insomnia.   Describes mood today as "ok". Pleasant. Flat. Mood symptoms - decreased depression since restarting Wellbutrin. Decreased anxiety. Denies irritability. Denies mood swings. Has been through two deaths in the family recently. Stating "I still feel really tired and fatigued". Restarted the Wellbutrin at 450mg  a week ago and is feeling "better". Would like to try taking the Provigil again for fatigue related to Fibromyalgia. Stable interest and motivation. Taking other medications as prescribed.  Energy levels "low". Active, does not have a regular exercise routine.   Enjoys some usual interests and activities. Single. Lives alone with dog - "[name]". Aunt lives a few houses up from here.  Appetite decreased. Weight loss over past few months. Adjusting to a different type of diet.  Sleeps is not consistent. Averages 5 to 8 hours. Focus and concentration stable with Adderall - "gets more done on the days she takes Adderall". Completing tasks. Managing some aspects of household - "it's a struggle energy wise". 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 Denies AH or VH.  Past medications for mental health diagnoses include: Elavil, Cymbalta, gabapentin caused swelling in her lower extremities, Horizant, Topamax, baclofen, Lunesta, Belsomra was effective, trazodone caused increased glucose sonar caused increased glucose, Modafinildid not help, Wellbutrin.  Allergies: Aloe, Duloxetine, Gabapentin, Olive oil, Lyrica [pregabalin], Tapentadol, and Zonisamide   PHQ2-9     [facility] Nutrition from 08/24/2016 in [facility] Nutrition  from 07/20/2016 in [facility] Nutrition from 01/15/2014 in [facility]  PHQ-2 Total Score 0 0 0 0       Review of Systems:  Review of Systems  Musculoskeletal: Negative for gait problem.  Neurological: Negative for tremors.  Psychiatric/Behavioral:       Please refer to HPI    Medications: I have reviewed the patient's current medications.  Current Outpatient Medications  Medication Sig Dispense Refill  . alum & mag hydroxide-simeth (MAALOX ADVANCED MAX ST) 400-400-40 MG/5ML suspension Take 15 mLs by mouth every 6 (six) hours as needed for indigestion. 355 mL 0  . amphetamine-dextroamphetamine (ADDERALL) 20 MG tablet Take one tablet twice daily. 60 tablet 0  . ASHWAGANDHA PO Take 1 capsule by mouth daily.     . B-COMPLEX-C PO Take 1 capsule by mouth daily.     13/12/2013 buPROPion (WELLBUTRIN XL) 150 MG 24 hr tablet TAKE THREE TABLETS EVERY MORNING. (Patient taking differently: Take 450 mg by mouth in the morning. ) 270 tablet 0  . clobetasol ointment (TEMOVATE) 0.05 % Apply 1 application topically 2 (two) times daily.    Marland Kitchen dicyclomine (BENTYL) 20 MG tablet Take 1 tablet (20 mg total) by mouth 2 (two) times daily. 20 tablet 0  . etonogestrel (NEXPLANON) 68 MG IMPL implant 1 each by Subdermal route continuous.     . famotidine (PEPCID) 20 MG tablet Take 1 tablet (20 mg total) by mouth 2 (two) times daily. (Patient not taking: Reported on 07/05/2019) 60 tablet 0  . ferrous sulfate 324 (65 Fe) MG TBEC Take 324 mg by mouth daily.     07/07/2019 Hyoscyamine Sulfate SL (LEVSIN/SL) 0.125 MG SUBL Place 1 each under the tongue 4 (  four) times daily as needed for up to 5 days. (Patient taking differently: Place 0.125 mg under the tongue 4 (four) times daily as needed. ) 30 tablet 0  . ibuprofen (ADVIL) 200 MG tablet You can drink 2 to 3 tablets every 6 hours as needed for pain.  You can buy this over-the-counter at any drugstore. (Patient not taking: Reported  on 07/05/2019)    . insulin aspart (NOVOLOG FLEXPEN) 100 UNIT/ML FlexPen Inject 5-20 Units into the skin See admin instructions. Sliding scale: If blood sugar over 150, takes 5 units. Maximum 20 units.    . Insulin Degludec (TRESIBA FLEXTOUCH) 100 UNIT/ML SOPN Inject 25 Units into the skin daily.     Marland Kitchen lidocaine (XYLOCAINE) 2 % solution Use as directed 15 mLs in the mouth or throat every 6 (six) hours as needed for mouth pain. (Patient not taking: Reported on 07/05/2019) 150 mL 0  . modafinil (PROVIGIL) 200 MG tablet Take 1 tablet (200 mg total) by mouth daily. 30 tablet 2  . ondansetron (ZOFRAN-ODT) 4 MG disintegrating tablet Take 4 mg by mouth every 8 (eight) hours as needed for nausea or vomiting.   0  . oxyCODONE (OXY IR/ROXICODONE) 5 MG immediate release tablet 5 mg every 6 (six) hours as needed for severe pain.     Marland Kitchen prochlorperazine (COMPAZINE) 10 MG tablet Take 1 tablet (10 mg total) by mouth 2 (two) times daily as needed for nausea or vomiting. 10 tablet 0  . promethazine (PHENERGAN) 25 MG suppository Place 1 suppository (25 mg total) rectally every 6 (six) hours as needed for nausea or vomiting. 12 each 0  . traMADol (ULTRAM) 50 MG tablet Take 1 tablet (50 mg total) by mouth every 6 (six) hours as needed. (Patient not taking: Reported on 07/05/2019) 15 tablet 0  . Turmeric (RA TURMERIC) 500 MG CAPS Take 500 mg by mouth daily.     Marland Kitchen venlafaxine XR (EFFEXOR-XR) 75 MG 24 hr capsule Take 150 mg by mouth at bedtime.     . Vitamin D, Ergocalciferol, (DRISDOL) 1.25 MG (50000 UNIT) CAPS capsule Take 50,000 Units by mouth once a week.     No current facility-administered medications for this visit.    Medication Side Effects: None  Allergies:  Allergies  Allergen Reactions  . Tapentadol Swelling  . Aloe Hives  . Duloxetine Other (See Comments)    RESTLESSNESS URGES TO MOVE  . Gabapentin Swelling    SWELLING REACTION OF FEET  . Lyrica [Pregabalin] Other (See Comments)    Heart  palpitations Fainting  . Olive Oil Hives  . Zonisamide Other (See Comments)    WORSENING PAIN    Past Medical History:  Diagnosis Date  . Fibromyalgia   . Fibromyalgia 06/28/2019  . Headache   . Insomnia   . Insulin dependent type 1 diabetes mellitus (HCC)   . 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  Recommend follow-up Pap smear/HPV at next annual exam.  . Peripheral neuropathy   . Seasonal allergies   . 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 Diabetes Mother   . Migraines Mother   . Hypertension Mother   . CVA Mother   . Breast cancer Maternal Grandmother   . Thyroid disease Maternal Grandmother   . Cancer Maternal Grandfather     Social History   Socioeconomic History  . Marital  status: Single    Spouse name: Not on file  . Number of children: Not on file  . Years of education: Not on file  . Highest education level: Not on file  Occupational History  . Not on file  Tobacco Use  . Smoking status: Never Smoker  . Smokeless tobacco: Never Used  Vaping Use  . Vaping Use: Never used  Substance and Sexual Activity  . Alcohol use: No    Alcohol/week: 0.0 standard drinks  . Drug use: No  . Sexual activity: Yes    Birth control/protection: Implant    Comment: 1ST INTERCOURSE- 3117, PARTNERS - 7 . Nexplanon 06/27/2018  Other Topics Concern  . Not on file  Social History Narrative   Student.  Not married.  Lives alone in a one story home.   Not working.   Social Determinants of Health   Financial Resource Strain:   . Difficulty of Paying Living Expenses:   Food Insecurity:   . Worried About Programme researcher, broadcasting/film/videounning Out of Food in the Last Year:   . Baristaan Out of Food in the Last Year:   Transportation Needs:   . Freight forwarderLack of Transportation (Medical):   Marland Kitchen. Lack of Transportation (Non-Medical):   Physical Activity:   . Days of Exercise per  Week:   . Minutes of Exercise per Session:   Stress:   . Feeling of Stress :   Social Connections:   . Frequency of Communication with Friends and Family:   . Frequency of Social Gatherings with Friends and Family:   . Attends Religious Services:   . Active Member of Clubs or Organizations:   . Attends BankerClub or Organization Meetings:   Marland Kitchen. Marital Status:   Intimate Partner Violence:   . Fear of Current or Ex-Partner:   . Emotionally Abused:   Marland Kitchen. Physically Abused:   . Sexually Abused:     Past Medical History, Surgical history, Social history, and Family history were reviewed and updated as appropriate.   Please see review of systems for further details on the patient's review from today.   Objective:   Physical Exam:  There were no vitals taken for this visit.  Physical Exam Constitutional:      General: She is not in acute distress. Musculoskeletal:        General: No deformity.  Neurological:     Mental Status: She is alert and oriented to person, place, and time.     Coordination: Coordination normal.  Psychiatric:        Attention and Perception: Attention and perception normal. She does not perceive auditory or visual hallucinations.        Mood and Affect: Mood normal. Mood is not anxious or depressed. Affect is not labile, blunt, angry or inappropriate.        Speech: Speech normal.        Behavior: Behavior normal.        Thought Content: Thought content normal. Thought content is not paranoid or delusional. Thought content does not include homicidal or suicidal ideation. Thought content does not include homicidal or suicidal plan.        Cognition and Memory: Cognition and memory normal.        Judgment: Judgment normal.     Comments: Insight intact     Lab Review:     Component Value Date/Time   NA 132 (L) 07/05/2019 1254   K 4.8 07/05/2019 1254   CL 95 (L) 07/05/2019 1254   CO2 25 07/05/2019  1254   GLUCOSE 295 (H) 07/05/2019 1254   BUN 10 07/05/2019  1254   CREATININE 0.98 07/05/2019 1254   CREATININE 0.55 12/25/2013 1455   CALCIUM 8.8 (L) 07/05/2019 1254   PROT 6.3 (L) 07/01/2019 2142   ALBUMIN 3.0 (L) 07/01/2019 2142   AST 41 07/01/2019 2142   ALT 250 (H) 07/01/2019 2142   ALKPHOS 277 (H) 07/01/2019 2142   BILITOT 1.1 07/01/2019 2142   GFRNONAA >60 07/05/2019 1254   GFRAA >60 07/05/2019 1254       Component Value Date/Time   WBC 10.3 07/05/2019 1254   RBC 3.96 07/05/2019 1254   HGB 12.2 07/05/2019 1254   HCT 37.6 07/05/2019 1254   PLT 378 07/05/2019 1254   MCV 94.9 07/05/2019 1254   MCV 97.7 (A) 05/24/2013 2021   MCH 30.8 07/05/2019 1254   MCHC 32.4 07/05/2019 1254   RDW 13.2 07/05/2019 1254   LYMPHSABS 3.4 10/26/2017 0607   MONOABS 0.9 10/26/2017 0607   EOSABS 0.1 10/26/2017 0607   BASOSABS 0.1 10/26/2017 0607    No results found for: POCLITH, LITHIUM   No results found for: PHENYTOIN, PHENOBARB, VALPROATE, CBMZ   .res Assessment: Plan:    Plan:  D/C Adderall 20 mg - 1 and 1/2 every morning to BID Add Modafinil 100mg  - 2 daily NAC 600 mg 1 twice daily. Effexor XR 75 mg, 2 p.o. every morning. Restarted Wellbutrin XL 150mg  3 daily - started back a week ago - denies seizure history.  RTC 4 weeks  Discussed potential benefits, risks, and side effects of stimulants with patient to include increased heart rate, palpitations, insomnia, increased anxiety, increased irritability, or decreased appetite.  Instructed patient to contact office if experiencing any significant tolerability issues.   Diagnoses and all orders for this visit:  Attention deficit hyperactivity disorder (ADHD), other type -     modafinil (PROVIGIL) 200 MG tablet; Take 1 tablet (200 mg total) by mouth daily.  Major depressive disorder, recurrent episode, moderate (HCC)  Generalized anxiety disorder  Insomnia, unspecified type     Please see After Visit Summary for patient specific instructions.  Future Appointments  Date Time  Provider Department Center  11/06/2019  3:00 PM Mozingo, , NP [facility] None  02/18/2020  9:00 AM Thereasa Solo, MD [facility] None    No orders of the defined types were placed in this encounter.   -------------------------------

## 2019-11-01 ENCOUNTER — Telehealth: Payer: Self-pay | Admitting: Adult Health

## 2019-11-01 NOTE — Telephone Encounter (Signed)
Theresa [name] called to report that she feels that the Modafinil could be increased just a bit.  It is almost time for a refill so could you correct the refills to reflect an increase.  She has an appt scheduled for 8/31 but thought perhaps she could move it out until she sees how an increase in the Modafinil works out.  If you agree with increase, send new RX to Karin Golden at [facility] and advice her as to when you want her to make a follow up visit.

## 2019-11-02 NOTE — Telephone Encounter (Signed)
Noted thank you

## 2019-11-02 NOTE — Telephone Encounter (Signed)
Theresa [name] can you let her know

## 2019-11-02 NOTE — Telephone Encounter (Signed)
Pt. Made aware.

## 2019-11-02 NOTE — Telephone Encounter (Signed)
Will discuss at her appointment .

## 2019-11-06 ENCOUNTER — Ambulatory Visit: Payer: BC Managed Care – PPO | Admitting: Adult Health

## 2019-11-15 ENCOUNTER — Ambulatory Visit (INDEPENDENT_AMBULATORY_CARE_PROVIDER_SITE_OTHER): Payer: BC Managed Care – PPO | Admitting: Adult Health

## 2019-11-15 ENCOUNTER — Other Ambulatory Visit: Payer: Self-pay

## 2019-11-15 ENCOUNTER — Encounter: Payer: Self-pay | Admitting: Adult Health

## 2019-11-15 DIAGNOSIS — F411 Generalized anxiety disorder: Secondary | ICD-10-CM | POA: Diagnosis not present

## 2019-11-15 DIAGNOSIS — G4700 Insomnia, unspecified: Secondary | ICD-10-CM | POA: Diagnosis not present

## 2019-11-15 DIAGNOSIS — F331 Major depressive disorder, recurrent, moderate: Secondary | ICD-10-CM | POA: Diagnosis not present

## 2019-11-15 DIAGNOSIS — F908 Attention-deficit hyperactivity disorder, other type: Secondary | ICD-10-CM

## 2019-11-15 MED ORDER — MODAFINIL 200 MG PO TABS: ORAL_TABLET | ORAL | 2 refills | Status: DC

## 2019-11-15 MED ORDER — BUPROPION HCL ER (XL) 150 MG PO TB24: ORAL_TABLET | ORAL | 3 refills | Status: DC

## 2019-11-15 NOTE — Progress Notes (Signed)
Theresa [name] [id number] 03/21/1990 29 y.o.  Subjective:   Patient ID:  Theresa [name] is a 29 y.o. (DOB 10/01/1990) female.  Chief Complaint: No chief complaint on file.   HPI Theresa [name] presents to the office today for follow-up of anxiety, depression, ADHD, and insomnia.   Describes mood today as "ok". Pleasant. Flat. Mood symptoms - decreased depression with Wellbutrin. Denies anxiety. Denies irritability. Denies mood swings. Not feeling as tired and fatigued. Working through recent losses. Feels like addition of Provigil has been helpful. Would like to increase dose. Stable interest and motivation. Taking other medications as prescribed.  Energy levels improved. Active, does not have a regular exercise routine.   Enjoys some usual interests and activities. Single. Lives alone with dog - "[name]". Family local.   Appetite decreased with GI issues. Weight loss - "i've been trying too".  Sleeps has improved. Averages 6 to 8 hours. Had to go out of town unexpectantly and forgot her medications. Focus and concentration improved with addition of Modafinil. Completing tasks. 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 Denies AH or VH.  Past medications for mental health diagnoses include: Elavil, Cymbalta, gabapentin caused swelling in her lower extremities, Horizant, Topamax, baclofen, Lunesta, Belsomra was effective, trazodone caused increased glucose sonar caused increased glucose, Modafinildid not help, Wellbutrin.  Allergies: Aloe, Duloxetine, Gabapentin, Olive oil, Lyrica [pregabalin], Tapentadol, and Zonisamide  PHQ2-9     [facility] 08/24/2016 [facility] Nutrition from 07/20/2016 [facility] Nutrition from 01/15/2014 in [facility]  PHQ-2 Total Score 0 0 0 0       Review  of Systems:  Review of Systems  Musculoskeletal: Negative for gait problem.  Neurological: Negative for tremors.  Psychiatric/Behavioral:       Please refer to HPI    Medications: I have reviewed the patient's current medications.  Current Outpatient Medications  Medication Sig Dispense Refill  . alum & mag hydroxide-simeth (MAALOX ADVANCED MAX ST) 400-400-40 MG/5ML suspension Take 15 mLs by mouth every 6 (six) hours as needed for indigestion. 355 mL 0  . ASHWAGANDHA PO Take 1 capsule by mouth daily.     . B-COMPLEX-C PO Take 1 capsule by mouth daily.     Marland Kitchen. buPROPion (WELLBUTRIN XL) 150 MG 24 hr tablet Take one tablet daily 90 tablet 3  . clobetasol ointment (TEMOVATE) 0.05 % Apply 1 application topically 2 (two) times daily.    Marland Kitchen. dicyclomine (BENTYL) 20 MG tablet Take 1 tablet (20 mg total) by mouth 2 (two) times daily. 20 tablet 0  . etonogestrel (NEXPLANON) 68 MG IMPL implant 1 each by Subdermal route continuous.     . famotidine (PEPCID) 20 MG tablet Take 1 tablet (20 mg total) by mouth 2 (two) times daily. (Patient not taking: Reported on 07/05/2019) 60 tablet 0  . ferrous sulfate 324 (65 Fe) MG TBEC Take 324 mg by mouth daily.     Marland Kitchen. Hyoscyamine Sulfate SL (LEVSIN/SL) 0.125 MG SUBL Place 1 each under the tongue 4 (four) times daily as needed for up to 5 days. (Patient taking differently: Place 0.125 mg under the tongue 4 (four) times daily as needed. ) 30 tablet 0  . ibuprofen (ADVIL) 200 MG tablet You can drink 2 to 3 tablets every 6 hours as needed for pain.  You can buy this over-the-counter at any drugstore. (Patient not  taking: Reported on 07/05/2019)    . insulin aspart (NOVOLOG FLEXPEN) 100 UNIT/ML FlexPen Inject 5-20 Units into the skin See admin instructions. Sliding scale: If blood sugar over 150, takes 5 units. Maximum 20 units.    . Insulin Degludec (TRESIBA FLEXTOUCH) 100 UNIT/ML SOPN Inject 25 Units into the skin daily.     Marland Kitchen lidocaine (XYLOCAINE) 2 % solution Use as  directed 15 mLs in the mouth or throat every 6 (six) hours as needed for mouth pain. (Patient not taking: Reported on 07/05/2019) 150 mL 0  . modafinil (PROVIGIL) 200 MG tablet Take one and 1/2 tablets daily. 45 tablet 2  . ondansetron (ZOFRAN-ODT) 4 MG disintegrating tablet Take 4 mg by mouth every 8 (eight) hours as needed for nausea or vomiting.   0  . oxyCODONE (OXY IR/ROXICODONE) 5 MG immediate release tablet 5 mg every 6 (six) hours as needed for severe pain.     Marland Kitchen prochlorperazine (COMPAZINE) 10 MG tablet Take 1 tablet (10 mg total) by mouth 2 (two) times daily as needed for nausea or vomiting. 10 tablet 0  . promethazine (PHENERGAN) 25 MG suppository Place 1 suppository (25 mg total) rectally every 6 (six) hours as needed for nausea or vomiting. 12 each 0  . traMADol (ULTRAM) 50 MG tablet Take 1 tablet (50 mg total) by mouth every 6 (six) hours as needed. (Patient not taking: Reported on 07/05/2019) 15 tablet 0  . Turmeric (RA TURMERIC) 500 MG CAPS Take 500 mg by mouth daily.     Marland Kitchen venlafaxine XR (EFFEXOR-XR) 75 MG 24 hr capsule Take 150 mg by mouth at bedtime.     . Vitamin D, Ergocalciferol, (DRISDOL) 1.25 MG (50000 UNIT) CAPS capsule Take 50,000 Units by mouth once a week.     No current facility-administered medications for this visit.    Medication Side Effects: None  Allergies:  Allergies  Allergen Reactions  . Tapentadol Swelling  . Aloe Hives  . Duloxetine Other (See Comments)    RESTLESSNESS URGES TO MOVE  . Gabapentin Swelling    SWELLING REACTION OF FEET  . Lyrica [Pregabalin] Other (See Comments)    Heart palpitations Fainting  . Olive Oil Hives  . Zonisamide Other (See Comments)    WORSENING PAIN    Past Medical History:  Diagnosis Date  . Fibromyalgia   . Fibromyalgia 06/28/2019  . Headache   . Insomnia   . Insulin dependent type 1 diabetes mellitus (HCC)   . LGSIL of cervix of undetermined significance 10/2018   Colposcopy normal with ECC showing atypical  fragments.  Recommend follow-up Pap smear/HPV at next annual exam.  . Peripheral neuropathy   . Seasonal allergies   . 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 Diabetes Mother   . Migraines Mother   . Hypertension Mother   . CVA Mother   . Breast cancer Maternal Grandmother   . Thyroid disease Maternal Grandmother   . Cancer Maternal Grandfather     Social History   Socioeconomic History  . Marital status: Single    Spouse name: Not on file  . Number of children: Not on file  . Years of education: Not on file  . Highest education level: Not on file  Occupational History  . Not on file  Tobacco Use  . Smoking status: Never Smoker  . Smokeless tobacco: Never Used  Vaping Use  . Vaping Use:  Never used  Substance and Sexual Activity  . Alcohol use: No    Alcohol/week: 0.0 standard drinks  . Drug use: No  . Sexual activity: Yes    Birth control/protection: Implant    Comment: 1ST INTERCOURSE- 67, PARTNERS - 7 . Nexplanon 06/27/2018  Other Topics Concern  . Not on file  Social History Narrative   Student.  Not married.  Lives alone in a one story home.   Not working.   Social Determinants of Health   Financial Resource Strain:   . Difficulty of Paying Living Expenses: Not on file  Food Insecurity:   . Worried About Programme researcher, broadcasting/film/video in the Last Year: Not on file  . Ran Out of Food in the Last Year: Not on file  Transportation Needs:   . Lack of Transportation (Medical): Not on file  . Lack of Transportation (Non-Medical): Not on file  Physical Activity:   . Days of Exercise per Week: Not on file  . Minutes of Exercise per Session: Not on file  Stress:   . Feeling of Stress : Not on file  Social Connections:   . Frequency of Communication with Friends and Family: Not on file  . Frequency of Social Gatherings with Friends and Family: Not on file  . Attends Religious Services: Not on file   . Active Member of Clubs or Organizations: Not on file  . Attends Banker Meetings: Not on file  . Marital Status: Not on file  Intimate Partner Violence:   . Fear of Current or Ex-Partner: Not on file  . Emotionally Abused: Not on file  . Physically Abused: Not on file  . Sexually Abused: Not on file    Past Medical History, Surgical history, Social history, and Family history were reviewed and updated as appropriate.   Please see review of systems for further details on the patient's review from today.   Objective:   Physical Exam:  There were no vitals taken for this visit.  Physical Exam Constitutional:      General: She is not in acute distress. Musculoskeletal:        General: No deformity.  Neurological:     Mental Status: She is alert and oriented to person, place, and time.     Coordination: Coordination normal.  Psychiatric:        Attention and Perception: Attention and perception normal. She does not perceive auditory or visual hallucinations.        Mood and Affect: Mood normal. Mood is not anxious or depressed. Affect is not labile, blunt, angry or inappropriate.        Speech: Speech normal.        Behavior: Behavior normal.        Thought Content: Thought content normal. Thought content is not paranoid or delusional. Thought content does not include homicidal or suicidal ideation. Thought content does not include homicidal or suicidal plan.        Cognition and Memory: Cognition and memory normal.        Judgment: Judgment normal.     Comments: Insight intact     Lab Review:     Component Value Date/Time   NA 132 (L) 07/05/2019 1254   K 4.8 07/05/2019 1254   CL 95 (L) 07/05/2019 1254   CO2 25 07/05/2019 1254   GLUCOSE 295 (H) 07/05/2019 1254   BUN 10 07/05/2019 1254   CREATININE 0.98 07/05/2019 1254   CREATININE 0.55 12/25/2013 1455  CALCIUM 8.8 (L) 07/05/2019 1254   PROT 6.3 (L) 07/01/2019 2142   ALBUMIN 3.0 (L) 07/01/2019 2142    AST 41 07/01/2019 2142   ALT 250 (H) 07/01/2019 2142   ALKPHOS 277 (H) 07/01/2019 2142   BILITOT 1.1 07/01/2019 2142   GFRNONAA >60 07/05/2019 1254   GFRAA >60 07/05/2019 1254       Component Value Date/Time   WBC 10.3 07/05/2019 1254   RBC 3.96 07/05/2019 1254   HGB 12.2 07/05/2019 1254   HCT 37.6 07/05/2019 1254   PLT 378 07/05/2019 1254   MCV 94.9 07/05/2019 1254   MCV 97.7 (A) 05/24/2013 2021   MCH 30.8 07/05/2019 1254   MCHC 32.4 07/05/2019 1254   RDW 13.2 07/05/2019 1254   LYMPHSABS 3.4 10/26/2017 0607   MONOABS 0.9 10/26/2017 0607   EOSABS 0.1 10/26/2017 0607   BASOSABS 0.1 10/26/2017 0607    No results found for: POCLITH, LITHIUM   No results found for: PHENYTOIN, PHENOBARB, VALPROATE, CBMZ   .res Assessment: Plan:    Plan:   Increase Modafinil 200mg  to 300mg  daily  NAC 600 mg 1 twice daily. Effexor XR 75 mg, 2 p.o. every morning. Wellbutrin XL 150mg  daily denies seizure history.  RTC 2 months  Discussed potential benefits, risks, and side effects of stimulants with patient to include increased heart rate, palpitations, insomnia, increased anxiety, increased irritability, or decreased appetite.  Instructed patient to contact office if experiencing any significant tolerability issues.   Diagnoses and all orders for this visit:  Insomnia, unspecified type  Attention deficit hyperactivity disorder (ADHD), other type -     modafinil (PROVIGIL) 200 MG tablet; Take one and 1/2 tablets daily. -     buPROPion (WELLBUTRIN XL) 150 MG 24 hr tablet; Take one tablet daily  Major depressive disorder, recurrent episode, moderate (HCC) -     buPROPion (WELLBUTRIN XL) 150 MG 24 hr tablet; Take one tablet daily  Generalized anxiety disorder -     buPROPion (WELLBUTRIN XL) 150 MG 24 hr tablet; Take one tablet daily     Please see After Visit Summary for patient specific instructions.  Future Appointments  Date Time Provider Department Center  02/18/2020  9:00 AM  , MD [facility] None    No orders of the defined types were placed in this encounter.   -------------------------------

## 2019-11-21 ENCOUNTER — Ambulatory Visit: Payer: BC Managed Care – PPO | Admitting: Adult Health

## 2019-12-25 ENCOUNTER — Encounter: Payer: Self-pay | Admitting: Adult Health

## 2019-12-25 ENCOUNTER — Other Ambulatory Visit: Payer: Self-pay

## 2019-12-25 ENCOUNTER — Ambulatory Visit (INDEPENDENT_AMBULATORY_CARE_PROVIDER_SITE_OTHER): Payer: BC Managed Care – PPO | Admitting: Adult Health

## 2019-12-25 DIAGNOSIS — F331 Major depressive disorder, recurrent, moderate: Secondary | ICD-10-CM | POA: Diagnosis not present

## 2019-12-25 DIAGNOSIS — G4700 Insomnia, unspecified: Secondary | ICD-10-CM | POA: Diagnosis not present

## 2019-12-25 DIAGNOSIS — F908 Attention-deficit hyperactivity disorder, other type: Secondary | ICD-10-CM

## 2019-12-25 DIAGNOSIS — F411 Generalized anxiety disorder: Secondary | ICD-10-CM | POA: Diagnosis not present

## 2019-12-25 MED ORDER — BUPROPION HCL ER (XL) 300 MG PO TB24: ORAL_TABLET | ORAL | 2 refills | Status: DC

## 2019-12-25 NOTE — Progress Notes (Signed)
Theresa [name] [id number] [birth year] 29 y.o.  Subjective:   Patient ID:  Theresa [name] is a 29 y.o. (DOB 01-03-1991) female.  Chief Complaint: No chief complaint on file.   HPI Theresa [name] presents to the office today for follow-up of anxiety, depression, ADHD, and insomnia.   Describes mood today as "ok". Pleasant. Flat. Mood symptoms - decreased depression with restarting Wellbutrin. Felt like it was helpful initially, but would like to increase dose. Increased anxiety. Denies irritability. Denies mood swings. Reports the "birthday blues". Feels like she makes people feel special on their birthday, but no one took the time to make her feel special. Was able to go to [place] to visit college friends. Forgot her Effexor and was off it for 5 days. Took prn Xanax while without it. Decreased interest and motivation. Taking other medications as prescribed.  Energy levels lower. Active, does not have a regular exercise routine.   Enjoys some usual interests and activities. Single. Lives alone with dog - "[name]". Family local.   Appetite decreased with GI issues. Weight stable.  Sleeps has improved. Averages 4 hours at best with increased anxiety. Completing tasks. 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 Denies AH or VH.  Past medications for mental health diagnoses include: Elavil, Cymbalta, gabapentin caused swelling in her lower extremities, Horizant, Topamax, baclofen, Lunesta, Belsomra was effective, trazodone caused increased glucose sonar caused increased glucose, Modafinildid not help, Wellbutrin.  Allergies: Aloe, Duloxetine, Gabapentin, Olive oil, Lyrica [pregabalin], Tapentadol, and Zonisamide    PHQ2-9     N[facility] 08/24/2016 [facility] Nutrition from 07/20/2016 [facility] Nutrition from  01/15/2014 [facility]  PHQ-2 Total Score 0 0 0 0       Review of Systems:  Review of Systems  Musculoskeletal: Negative for gait problem.  Neurological: Negative for tremors.  Psychiatric/Behavioral:       Please refer to HPI    Medications: I have reviewed the patient's current medications.  Current Outpatient Medications  Medication Sig Dispense Refill  . alum & mag hydroxide-simeth (MAALOX ADVANCED MAX ST) 400-400-40 MG/5ML suspension Take 15 mLs by mouth every 6 (six) hours as needed for indigestion. 355 mL 0  . ASHWAGANDHA PO Take 1 capsule by mouth daily.     . B-COMPLEX-C PO Take 1 capsule by mouth daily.     Marland Kitchen buPROPion (WELLBUTRIN XL) 300 MG 24 hr tablet Take one tablet daily 30 tablet 2  . clobetasol ointment (TEMOVATE) 0.05 % Apply 1 application topically 2 (two) times daily.    Marland Kitchen dicyclomine (BENTYL) 20 MG tablet Take 1 tablet (20 mg total) by mouth 2 (two) times daily. 20 tablet 0  . etonogestrel (NEXPLANON) 68 MG IMPL implant 1 each by Subdermal route continuous.     . famotidine (PEPCID) 20 MG tablet Take 1 tablet (20 mg total) by mouth 2 (two) times daily. (Patient not taking: Reported on 07/05/2019) 60 tablet 0  . ferrous sulfate 324 (65 Fe) MG TBEC Take 324 mg by mouth daily.     Marland Kitchen Hyoscyamine Sulfate SL (LEVSIN/SL) 0.125 MG SUBL Place 1 each under the tongue 4 (four) times daily as needed for up to 5 days. (Patient taking differently: Place 0.125 mg under the tongue 4 (four) times daily as needed. ) 30 tablet 0  . ibuprofen (ADVIL) 200 MG tablet You can drink  2 to 3 tablets every 6 hours as needed for pain.  You can buy this over-the-counter at any drugstore. (Patient not taking: Reported on 07/05/2019)    . insulin aspart (NOVOLOG FLEXPEN) 100 UNIT/ML FlexPen Inject 5-20 Units into the skin See admin instructions. Sliding scale: If blood sugar over 150, takes 5 units. Maximum 20 units.    . Insulin Degludec (TRESIBA FLEXTOUCH) 100  UNIT/ML SOPN Inject 25 Units into the skin daily.     Marland Kitchen lidocaine (XYLOCAINE) 2 % solution Use as directed 15 mLs in the mouth or throat every 6 (six) hours as needed for mouth pain. (Patient not taking: Reported on 07/05/2019) 150 mL 0  . modafinil (PROVIGIL) 200 MG tablet Take one and 1/2 tablets daily. 45 tablet 2  . ondansetron (ZOFRAN-ODT) 4 MG disintegrating tablet Take 4 mg by mouth every 8 (eight) hours as needed for nausea or vomiting.   0  . oxyCODONE (OXY IR/ROXICODONE) 5 MG immediate release tablet 5 mg every 6 (six) hours as needed for severe pain.     Marland Kitchen prochlorperazine (COMPAZINE) 10 MG tablet Take 1 tablet (10 mg total) by mouth 2 (two) times daily as needed for nausea or vomiting. 10 tablet 0  . promethazine (PHENERGAN) 25 MG suppository Place 1 suppository (25 mg total) rectally every 6 (six) hours as needed for nausea or vomiting. 12 each 0  . traMADol (ULTRAM) 50 MG tablet Take 1 tablet (50 mg total) by mouth every 6 (six) hours as needed. (Patient not taking: Reported on 07/05/2019) 15 tablet 0  . Turmeric (RA TURMERIC) 500 MG CAPS Take 500 mg by mouth daily.     Marland Kitchen venlafaxine XR (EFFEXOR-XR) 75 MG 24 hr capsule Take 150 mg by mouth at bedtime.     . Vitamin D, Ergocalciferol, (DRISDOL) 1.25 MG (50000 UNIT) CAPS capsule Take 50,000 Units by mouth once a week.     No current facility-administered medications for this visit.    Medication Side Effects: None  Allergies:  Allergies  Allergen Reactions  . Tapentadol Swelling  . Aloe Hives  . Duloxetine Other (See Comments)    RESTLESSNESS URGES TO MOVE  . Gabapentin Swelling    SWELLING REACTION OF FEET  . Lyrica [Pregabalin] Other (See Comments)    Heart palpitations Fainting  . Olive Oil Hives  . Zonisamide Other (See Comments)    WORSENING PAIN    Past Medical History:  Diagnosis Date  . Fibromyalgia   . Fibromyalgia 06/28/2019  . Headache   . Insomnia   . Insulin dependent type 1 diabetes mellitus (HCC)   .  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  Recommend follow-up Pap smear/HPV at next annual exam.  . Peripheral neuropathy   . Seasonal allergies   . 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 Diabetes Mother   . Migraines Mother   . Hypertension Mother   . CVA Mother   . Breast cancer Maternal Grandmother   . Thyroid disease Maternal Grandmother   . Cancer Maternal Grandfather     Social History   Socioeconomic History  . Marital status: Single    Spouse name: Not on file  . Number of children: Not on file  . Years of education: Not on file  . Highest education level: Not on file  Occupational History  . Not on file  Tobacco Use  . Smoking status: Never Smoker  . Smokeless tobacco: Never Used  Vaping Use  . Vaping Use: Never used  Substance and Sexual Activity  . Alcohol use: No    Alcohol/week: 0.0 standard drinks  . Drug use: No  . Sexual activity: Yes    Birth control/protection: Implant    Comment: 1ST INTERCOURSE- 18, PARTNERS - 6 . Nexplanon 06/27/2018  Other Topics Concern  . Not on file  Social History Narrative   Student.  Not married.  Lives alone in a one story home.   Not working.   Social Determinants of Health   Financial Resource Strain:   . Difficulty of Paying Living Expenses: Not on file  Food Insecurity:   . Worried About Programme researcher, broadcasting/film/video in the Last Year: Not on file  . Ran Out of Food in the Last Year: Not on file  Transportation Needs:   . Lack of Transportation (Medical): Not on file  . Lack of Transportation (Non-Medical): Not on file  Physical Activity:   . Days of Exercise per Week: Not on file  . Minutes of Exercise per Session: Not on file  Stress:   . Feeling of Stress : Not on file  Social Connections:   . Frequency of Communication with Friends and Family: Not on file  . Frequency of  Social Gatherings with Friends and Family: Not on file  . Attends Religious Services: Not on file  . Active Member of Clubs or Organizations: Not on file  . Attends Banker Meetings: Not on file  . Marital Status: Not on file  Intimate Partner Violence:   . Fear of Current or Ex-Partner: Not on file  . Emotionally Abused: Not on file  . Physically Abused: Not on file  . Sexually Abused: Not on file    Past Medical History, Surgical history, Social history, and Family history were reviewed and updated as appropriate.   Please see review of systems for further details on the patient's review from today.   Objective:   Physical Exam:  There were no vitals taken for this visit.  Physical Exam Constitutional:      General: She is not in acute distress. Musculoskeletal:        General: No deformity.  Neurological:     Mental Status: She is alert and oriented to person, place, and time.     Coordination: Coordination normal.  Psychiatric:        Attention and Perception: Attention and perception normal. She does not perceive auditory or visual hallucinations.        Mood and Affect: Mood normal. Mood is not anxious or depressed. Affect is not labile, blunt, angry or inappropriate.        Speech: Speech normal.        Behavior: Behavior normal.        Thought Content: Thought content normal. Thought content is not paranoid or delusional. Thought content does not include homicidal or suicidal ideation. Thought content does not include homicidal or suicidal plan.        Cognition and Memory: Cognition and memory normal.        Judgment: Judgment normal.     Comments: Insight intact     Lab Review:     Component Value Date/Time   NA 132 (L) 07/05/2019 1254   K 4.8 07/05/2019 1254   CL 95 (L) 07/05/2019 1254   CO2 25 07/05/2019 1254   GLUCOSE 295 (H) 07/05/2019  1254   BUN 10 07/05/2019 1254   CREATININE 0.98 07/05/2019 1254   CREATININE 0.55 12/25/2013 1455    CALCIUM 8.8 (L) 07/05/2019 1254   PROT 6.3 (L) 07/01/2019 2142   ALBUMIN 3.0 (L) 07/01/2019 2142   AST 41 07/01/2019 2142   ALT 250 (H) 07/01/2019 2142   ALKPHOS 277 (H) 07/01/2019 2142   BILITOT 1.1 07/01/2019 2142   GFRNONAA >60 07/05/2019 1254   GFRAA >60 07/05/2019 1254       Component Value Date/Time   WBC 10.3 07/05/2019 1254   RBC 3.96 07/05/2019 1254   HGB 12.2 07/05/2019 1254   HCT 37.6 07/05/2019 1254   PLT 378 07/05/2019 1254   MCV 94.9 07/05/2019 1254   MCV 97.7 (A) 05/24/2013 2021   MCH 30.8 07/05/2019 1254   MCHC 32.4 07/05/2019 1254   RDW 13.2 07/05/2019 1254   LYMPHSABS 3.4 10/26/2017 0607   MONOABS 0.9 10/26/2017 0607   EOSABS 0.1 10/26/2017 0607   BASOSABS 0.1 10/26/2017 0607    No results found for: POCLITH, LITHIUM   No results found for: PHENYTOIN, PHENOBARB, VALPROATE, CBMZ   .res Assessment: Plan:    Plan:   Modafinil 200mg  - one and 1/2 tablets daily Effexor XR 75 mg, 2 p.o. every morning. Increase Wellbutrin XL 150mg  to 300mg  daily - denies seizure history.  RTC 4 weeks  Discussed potential benefits, risks, and side effects of stimulants with patient to include increased heart rate, palpitations, insomnia, increased anxiety, increased irritability, or decreased appetite.  Instructed patient to contact office if experiencing any significant tolerability issues.   Diagnoses and all orders for this visit:  Attention deficit hyperactivity disorder (ADHD), other type -     buPROPion (WELLBUTRIN XL) 300 MG 24 hr tablet; Take one tablet daily  Major depressive disorder, recurrent episode, moderate (HCC) -     buPROPion (WELLBUTRIN XL) 300 MG 24 hr tablet; Take one tablet daily  Generalized anxiety disorder -     buPROPion (WELLBUTRIN XL) 300 MG 24 hr tablet; Take one tablet daily  Insomnia, unspecified type     Please see After Visit Summary for patient specific instructions.  Future Appointments  Date Time Provider Department Center   02/18/2020  9:00 AM Sherrie GeorgeMatthews, John D, MD [facility] None    No orders of the defined types were placed in this encounter.   -------------------------------

## 2020-01-15 ENCOUNTER — Ambulatory Visit: Payer: BC Managed Care – PPO | Admitting: Adult Health

## 2020-01-22 ENCOUNTER — Ambulatory Visit: Payer: BC Managed Care – PPO | Admitting: Adult Health

## 2020-02-18 ENCOUNTER — Encounter (INDEPENDENT_AMBULATORY_CARE_PROVIDER_SITE_OTHER): Payer: BC Managed Care – PPO | Admitting: Ophthalmology

## 2020-02-22 ENCOUNTER — Ambulatory Visit: Payer: BC Managed Care – PPO | Admitting: Adult Health

## 2020-02-25 ENCOUNTER — Ambulatory Visit (INDEPENDENT_AMBULATORY_CARE_PROVIDER_SITE_OTHER): Payer: BC Managed Care – PPO | Admitting: Adult Health

## 2020-02-25 ENCOUNTER — Encounter: Payer: Self-pay | Admitting: Adult Health

## 2020-02-25 ENCOUNTER — Other Ambulatory Visit: Payer: Self-pay

## 2020-02-25 DIAGNOSIS — F908 Attention-deficit hyperactivity disorder, other type: Secondary | ICD-10-CM | POA: Diagnosis not present

## 2020-02-25 DIAGNOSIS — F331 Major depressive disorder, recurrent, moderate: Secondary | ICD-10-CM

## 2020-02-25 DIAGNOSIS — F411 Generalized anxiety disorder: Secondary | ICD-10-CM

## 2020-02-25 DIAGNOSIS — G4700 Insomnia, unspecified: Secondary | ICD-10-CM

## 2020-02-25 DIAGNOSIS — F988 Other specified behavioral and emotional disorders with onset usually occurring in childhood and adolescence: Secondary | ICD-10-CM

## 2020-02-25 MED ORDER — AMPHETAMINE-DEXTROAMPHETAMINE 20 MG PO TABS: ORAL_TABLET | ORAL | 0 refills | Status: DC

## 2020-02-25 MED ORDER — VENLAFAXINE HCL ER 37.5 MG PO CP24: ORAL_CAPSULE | ORAL | 0 refills | Status: DC

## 2020-02-25 MED ORDER — BUPROPION HCL ER (XL) 300 MG PO TB24: ORAL_TABLET | ORAL | 2 refills | Status: DC

## 2020-02-25 NOTE — Progress Notes (Signed)
Theresa [name] [id number] 02/20/1991 29 y.o.  Subjective:   Patient ID:  Theresa [name] is a 29 y.o. (DOB 1990-12-26) female.  Chief Complaint: No chief complaint on file.   HPI Theresa [name] presents to the office today for follow-up of anxiety, depression, ADHD, and insomnia.   Describes mood today as "not so good". Pleasant. Flat. Mood symptoms - reports depression. Has "bouts of anxiety. Denies irritability. Denies mood swings. Stating "I'm not doing that well". Is working with GP to taper off of Effexor. Has decreased dose to 75mg  every other day. Does not feel like the Modafinil is working well for her. Starting tasks and not finishing them. Has motivation, but can't get going to do things. Has been reading about Lexapro and Prozac and would like to try one of those. Also feels like the Modafinil worked better for focus and concentration. Doesn't feel like current medication is working. Stating "I have been struggling". Decreased interest and motivation. Taking other medications as prescribed.  Energy levels lower. Active, does not have a regular exercise routine.   Enjoys some usual interests and activities. Single. Lives alone with dog - "[name]". Family local.   Appetite decreased with GI issues. Weight stable.  Sleeps better some nights than others. Averages 4 hours.  Difficulties with focus and concentration. Completing tasks. 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  Denies AH or VH.  Past medications for mental health diagnoses include: Elavil, Cymbalta, gabapentin caused swelling in her lower extremities, Horizant, Topamax, baclofen, Lunesta, Belsomra was effective, trazodone caused increased glucose sonar caused increased glucose, Modafinildid not help, Wellbutrin.  Allergies: Aloe, Duloxetine, Gabapentin, Olive oil, Lyrica [pregabalin], Tapentadol, and Zonisamide    PHQ2-9   Flowsheet Row Nutrition [facility] 11/24/2016 [facility] Nutrition from 08/24/2016 in [facility] Nutrition from 07/20/2016 in [facility] Nutrition from 01/15/2014 in [facility]  PHQ-2 Total Score 0 0 0 0       Review of Systems:  Review of Systems  Musculoskeletal: Negative for gait problem.  Neurological: Negative for tremors.  Psychiatric/Behavioral:       Please refer to HPI    Medications: I have reviewed the patient's current medications.  Current Outpatient Medications  Medication Sig Dispense Refill  . alum & mag hydroxide-simeth (MAALOX ADVANCED MAX ST) 400-400-40 MG/5ML suspension Take 15 mLs by mouth every 6 (six) hours as needed for indigestion. 355 mL 0  . amphetamine-dextroamphetamine (ADDERALL) 20 MG tablet Take one and 1/2 tablets daily. 45 tablet 0  . ASHWAGANDHA PO Take 1 capsule by mouth daily.     . B-COMPLEX-C PO Take 1 capsule by mouth daily.     13/12/2013 buPROPion (WELLBUTRIN XL) 300 MG 24 hr tablet Take one tablet daily 30 tablet 2  . clobetasol ointment (TEMOVATE) 0.05 % Apply 1 application topically 2 (two) times daily.    Marland Kitchen dicyclomine (BENTYL) 20 MG tablet Take 1 tablet (20 mg total) by mouth 2 (two) times daily. 20 tablet 0  . etonogestrel (NEXPLANON) 68 MG IMPL implant 1 each by Subdermal route continuous.     . famotidine (PEPCID) 20 MG tablet Take 1 tablet (20 mg total) by mouth 2 (two) times daily. (Patient not taking: Reported on 07/05/2019) 60 tablet 0  . ferrous sulfate 324 (65 Fe) MG TBEC Take 324 mg by mouth daily.     07/07/2019 Hyoscyamine Sulfate SL (LEVSIN/SL) 0.125 MG SUBL  Place 1 each under the tongue 4 (four) times daily as needed for up to 5 days. (Patient taking differently: Place 0.125 mg under the tongue 4 (four) times daily as needed. ) 30 tablet 0  . ibuprofen (ADVIL) 200 MG tablet You can drink 2 to 3 tablets every 6 hours as needed for pain.  You can buy this over-the-counter at any drugstore.  (Patient not taking: Reported on 07/05/2019)    . insulin aspart (NOVOLOG FLEXPEN) 100 UNIT/ML FlexPen Inject 5-20 Units into the skin See admin instructions. Sliding scale: If blood sugar over 150, takes 5 units. Maximum 20 units.    . Insulin Degludec (TRESIBA FLEXTOUCH) 100 UNIT/ML SOPN Inject 25 Units into the skin daily.     Marland Kitchen lidocaine (XYLOCAINE) 2 % solution Use as directed 15 mLs in the mouth or throat every 6 (six) hours as needed for mouth pain. (Patient not taking: Reported on 07/05/2019) 150 mL 0  . ondansetron (ZOFRAN-ODT) 4 MG disintegrating tablet Take 4 mg by mouth every 8 (eight) hours as needed for nausea or vomiting.   0  . oxyCODONE (OXY IR/ROXICODONE) 5 MG immediate release tablet 5 mg every 6 (six) hours as needed for severe pain.     Marland Kitchen prochlorperazine (COMPAZINE) 10 MG tablet Take 1 tablet (10 mg total) by mouth 2 (two) times daily as needed for nausea or vomiting. 10 tablet 0  . promethazine (PHENERGAN) 25 MG suppository Place 1 suppository (25 mg total) rectally every 6 (six) hours as needed for nausea or vomiting. 12 each 0  . traMADol (ULTRAM) 50 MG tablet Take 1 tablet (50 mg total) by mouth every 6 (six) hours as needed. (Patient not taking: Reported on 07/05/2019) 15 tablet 0  . Turmeric (RA TURMERIC) 500 MG CAPS Take 500 mg by mouth daily.     Marland Kitchen venlafaxine XR (EFFEXOR-XR) 37.5 MG 24 hr capsule Take one capsule daily. 30 capsule 0  . Vitamin D, Ergocalciferol, (DRISDOL) 1.25 MG (50000 UNIT) CAPS capsule Take 50,000 Units by mouth once a week.     No current facility-administered medications for this visit.    Medication Side Effects: None  Allergies:  Allergies  Allergen Reactions  . Tapentadol Swelling  . Aloe Hives  . Duloxetine Other (See Comments)    RESTLESSNESS URGES TO MOVE  . Gabapentin Swelling    SWELLING REACTION OF FEET Other reaction(s): edema  . Olive Oil Hives  . Pregabalin Other (See Comments)    Heart palpitations Fainting Other  reaction(s): chest pain, sweats  . Other     Other reaction(s): rash  . Zonisamide Other (See Comments)    WORSENING PAIN    Past Medical History:  Diagnosis Date  . Fibromyalgia   . Fibromyalgia 06/28/2019  . Headache   . Insomnia   . Insulin dependent type 1 diabetes mellitus (HCC)   . 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  Recommend follow-up Pap smear/HPV at next annual exam.  . Peripheral neuropathy   . Seasonal allergies   . 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 Diabetes Mother   . Migraines Mother   . Hypertension Mother   . CVA Mother   . Breast cancer Maternal Grandmother   . Thyroid disease Maternal Grandmother   . Cancer Maternal Grandfather     Social History   Socioeconomic History  .  Marital status: Single    Spouse name: Not on file  . Number of children: Not on file  . Years of education: Not on file  . Highest education level: Not on file  Occupational History  . Not on file  Tobacco Use  . Smoking status: Never Smoker  . Smokeless tobacco: Never Used  Vaping Use  . Vaping Use: Never used  Substance and Sexual Activity  . Alcohol use: No    Alcohol/week: 0.0 standard drinks  . Drug use: No  . Sexual activity: Yes    Birth control/protection: Implant    Comment: 1ST INTERCOURSE- 3417, PARTNERS - 7 . Nexplanon 06/27/2018  Other Topics Concern  . Not on file  Social History Narrative   Student.  Not married.  Lives alone in a one story home.   Not working.   Social Determinants of Health   Financial Resource Strain: Not on file  Food Insecurity: Not on file  Transportation Needs: Not on file  Physical Activity: Not on file  Stress: Not on file  Social Connections: Not on file  Intimate Partner Violence: Not on file    Past Medical History, Surgical history, Social history, and Family history  were reviewed and updated as appropriate.   Please see review of systems for further details on the patient's review from today.   Objective:   Physical Exam:  There were no vitals taken for this visit.  Physical Exam Constitutional:      General: She is not in acute distress. Musculoskeletal:        General: No deformity.  Neurological:     Mental Status: She is alert and oriented to person, place, and time.     Coordination: Coordination normal.  Psychiatric:        Attention and Perception: Attention and perception normal. She does not perceive auditory or visual hallucinations.        Mood and Affect: Mood is anxious and depressed. Affect is not labile, blunt, angry or inappropriate.        Speech: Speech normal.        Behavior: Behavior normal.        Thought Content: Thought content normal. Thought content is not paranoid or delusional. Thought content does not include homicidal or suicidal ideation. Thought content does not include homicidal or suicidal plan.        Cognition and Memory: Cognition and memory normal.        Judgment: Judgment normal.     Comments: Insight intact     Lab Review:     Component Value Date/Time   NA 132 (L) 07/05/2019 1254   K 4.8 07/05/2019 1254   CL 95 (L) 07/05/2019 1254   CO2 25 07/05/2019 1254   GLUCOSE 295 (H) 07/05/2019 1254   BUN 10 07/05/2019 1254   CREATININE 0.98 07/05/2019 1254   CREATININE 0.55 12/25/2013 1455   CALCIUM 8.8 (L) 07/05/2019 1254   PROT 6.3 (L) 07/01/2019 2142   ALBUMIN 3.0 (L) 07/01/2019 2142   AST 41 07/01/2019 2142   ALT 250 (H) 07/01/2019 2142   ALKPHOS 277 (H) 07/01/2019 2142   BILITOT 1.1 07/01/2019 2142   GFRNONAA >60 07/05/2019 1254   GFRAA >60 07/05/2019 1254       Component Value Date/Time   WBC 10.3 07/05/2019 1254   RBC 3.96 07/05/2019 1254   HGB 12.2 07/05/2019 1254   HCT 37.6 07/05/2019 1254   PLT 378 07/05/2019 1254   MCV 94.9 07/05/2019  1254   MCV 97.7 (A) 05/24/2013 2021   MCH  30.8 07/05/2019 1254   MCHC 32.4 07/05/2019 1254   RDW 13.2 07/05/2019 1254   LYMPHSABS 3.4 10/26/2017 0607   MONOABS 0.9 10/26/2017 0607   EOSABS 0.1 10/26/2017 0607   BASOSABS 0.1 10/26/2017 0607    No results found for: POCLITH, LITHIUM   No results found for: PHENYTOIN, PHENOBARB, VALPROATE, CBMZ   .res Assessment: Plan:    Plan:   D/C Modafinil 200mg  - one and 1/2 tablets daily Decrease Effexor XR 75 to 37.5mg  daily for 2 weeks, then d/c Wellbutrin XL 300mg  daily - denies seizure history. Add Adderall 20 mg - 1 and 1/2 every morning   129/89 - 94  Start Prozac, Cymbalta, or Lexapro next visit.  RTC 4 weeks  Discussed potential benefits, risks, and side effects of stimulants with patient to include increased heart rate, palpitations, insomnia, increased anxiety, increased irritability, or decreased appetite.  Instructed patient to contact office if experiencing any significant tolerability issues.      Diagnoses and all orders for this visit:  Insomnia, unspecified type  Attention deficit hyperactivity disorder (ADHD), other type -     buPROPion (WELLBUTRIN XL) 300 MG 24 hr tablet; Take one tablet daily  Major depressive disorder, recurrent episode, moderate (HCC) -     buPROPion (WELLBUTRIN XL) 300 MG 24 hr tablet; Take one tablet daily  Generalized anxiety disorder -     buPROPion (WELLBUTRIN XL) 300 MG 24 hr tablet; Take one tablet daily  Attention deficit disorder, unspecified hyperactivity presence  Other orders -     venlafaxine XR (EFFEXOR-XR) 37.5 MG 24 hr capsule; Take one capsule daily. -     amphetamine-dextroamphetamine (ADDERALL) 20 MG tablet; Take one and 1/2 tablets daily.     Please see After Visit Summary for patient specific instructions.  Future Appointments  Date Time Provider Department Center  02/28/2020  9:00 AM , MD [facility] None    No orders of the defined types were placed in this  encounter.   -------------------------------

## 2020-02-28 ENCOUNTER — Encounter (INDEPENDENT_AMBULATORY_CARE_PROVIDER_SITE_OTHER): Payer: BC Managed Care – PPO | Admitting: Ophthalmology

## 2020-03-11 ENCOUNTER — Other Ambulatory Visit: Payer: Self-pay

## 2020-03-11 ENCOUNTER — Encounter (INDEPENDENT_AMBULATORY_CARE_PROVIDER_SITE_OTHER): Payer: BC Managed Care – PPO | Admitting: Ophthalmology

## 2020-03-11 DIAGNOSIS — E103593 Type 1 diabetes mellitus with proliferative diabetic retinopathy without macular edema, bilateral: Secondary | ICD-10-CM | POA: Diagnosis not present

## 2020-03-11 DIAGNOSIS — H43812 Vitreous degeneration, left eye: Secondary | ICD-10-CM | POA: Diagnosis not present

## 2020-03-11 DIAGNOSIS — H26493 Other secondary cataract, bilateral: Secondary | ICD-10-CM | POA: Diagnosis not present

## 2020-03-11 DIAGNOSIS — H35372 Puckering of macula, left eye: Secondary | ICD-10-CM

## 2020-03-20 ENCOUNTER — Telehealth: Payer: Self-pay | Admitting: Adult Health

## 2020-03-20 ENCOUNTER — Ambulatory Visit: Payer: BC Managed Care – PPO | Admitting: Adult Health

## 2020-03-20 NOTE — Telephone Encounter (Signed)
Pt requesting a refill on her Adderall. She rescheduled her 1/13 appt for 2/7 due to provider out (family/emergency). Pt uses the [facility] on [address]

## 2020-03-21 ENCOUNTER — Other Ambulatory Visit: Payer: Self-pay

## 2020-03-21 MED ORDER — AMPHETAMINE-DEXTROAMPHETAMINE 20 MG PO TABS: ORAL_TABLET | ORAL | 0 refills | Status: DC

## 2020-03-21 NOTE — Telephone Encounter (Signed)
Last refill 02/26/20 for Adderall #45 Will probably need a PA due to first of year Pended for Corie Chiquito, NP to send due to Almira Coaster out of office.

## 2020-04-02 ENCOUNTER — Encounter (INDEPENDENT_AMBULATORY_CARE_PROVIDER_SITE_OTHER): Payer: BC Managed Care – PPO | Admitting: Ophthalmology

## 2020-04-07 ENCOUNTER — Emergency Department (HOSPITAL_COMMUNITY): Payer: BC Managed Care – PPO

## 2020-04-07 ENCOUNTER — Other Ambulatory Visit: Payer: Self-pay

## 2020-04-07 ENCOUNTER — Inpatient Hospital Stay (HOSPITAL_COMMUNITY)
Admission: EM | Admit: 2020-04-07 | Discharge: 2020-04-10 | DRG: 639 | Disposition: A | Discharge status: Home / Self Care | Payer: BC Managed Care – PPO | Attending: Internal Medicine | Admitting: Internal Medicine

## 2020-04-07 ENCOUNTER — Ambulatory Visit (HOSPITAL_COMMUNITY): Admission: EM | Admit: 2020-04-07 | Discharge: 2020-04-07 | Payer: BC Managed Care – PPO

## 2020-04-07 ENCOUNTER — Encounter (INDEPENDENT_AMBULATORY_CARE_PROVIDER_SITE_OTHER): Payer: BC Managed Care – PPO | Admitting: Ophthalmology

## 2020-04-07 ENCOUNTER — Encounter (HOSPITAL_COMMUNITY): Payer: Self-pay | Admitting: Emergency Medicine

## 2020-04-07 ENCOUNTER — Encounter (HOSPITAL_COMMUNITY): Payer: Self-pay

## 2020-04-07 DIAGNOSIS — Z20822 Contact with and (suspected) exposure to covid-19: Secondary | ICD-10-CM | POA: Diagnosis present

## 2020-04-07 DIAGNOSIS — Z79891 Long term (current) use of opiate analgesic: Secondary | ICD-10-CM

## 2020-04-07 DIAGNOSIS — E1043 Type 1 diabetes mellitus with diabetic autonomic (poly)neuropathy: Secondary | ICD-10-CM | POA: Diagnosis present

## 2020-04-07 DIAGNOSIS — J302 Other seasonal allergic rhinitis: Secondary | ICD-10-CM | POA: Diagnosis present

## 2020-04-07 DIAGNOSIS — E876 Hypokalemia: Secondary | ICD-10-CM | POA: Diagnosis present

## 2020-04-07 DIAGNOSIS — R112 Nausea with vomiting, unspecified: Secondary | ICD-10-CM | POA: Diagnosis not present

## 2020-04-07 DIAGNOSIS — E1010 Type 1 diabetes mellitus with ketoacidosis without coma: Principal | ICD-10-CM | POA: Diagnosis present

## 2020-04-07 DIAGNOSIS — E1310 Other specified diabetes mellitus with ketoacidosis without coma: Secondary | ICD-10-CM | POA: Diagnosis not present

## 2020-04-07 DIAGNOSIS — F909 Attention-deficit hyperactivity disorder, unspecified type: Secondary | ICD-10-CM | POA: Diagnosis present

## 2020-04-07 DIAGNOSIS — R9431 Abnormal electrocardiogram [ECG] [EKG]: Secondary | ICD-10-CM | POA: Diagnosis present

## 2020-04-07 DIAGNOSIS — Z8249 Family history of ischemic heart disease and other diseases of the circulatory system: Secondary | ICD-10-CM

## 2020-04-07 DIAGNOSIS — K219 Gastro-esophageal reflux disease without esophagitis: Secondary | ICD-10-CM | POA: Diagnosis present

## 2020-04-07 DIAGNOSIS — G4700 Insomnia, unspecified: Secondary | ICD-10-CM | POA: Diagnosis present

## 2020-04-07 DIAGNOSIS — M797 Fibromyalgia: Secondary | ICD-10-CM | POA: Diagnosis present

## 2020-04-07 DIAGNOSIS — F418 Other specified anxiety disorders: Secondary | ICD-10-CM | POA: Diagnosis present

## 2020-04-07 DIAGNOSIS — E1065 Type 1 diabetes mellitus with hyperglycemia: Secondary | ICD-10-CM

## 2020-04-07 DIAGNOSIS — Z6835 Body mass index (BMI) 35.0-35.9, adult: Secondary | ICD-10-CM

## 2020-04-07 DIAGNOSIS — F32A Depression, unspecified: Secondary | ICD-10-CM | POA: Diagnosis present

## 2020-04-07 DIAGNOSIS — Z833 Family history of diabetes mellitus: Secondary | ICD-10-CM

## 2020-04-07 DIAGNOSIS — Z823 Family history of stroke: Secondary | ICD-10-CM

## 2020-04-07 DIAGNOSIS — F419 Anxiety disorder, unspecified: Secondary | ICD-10-CM | POA: Diagnosis present

## 2020-04-07 DIAGNOSIS — Z794 Long term (current) use of insulin: Secondary | ICD-10-CM

## 2020-04-07 LAB — I-STAT VENOUS BLOOD GAS, ED
Acid-Base Excess: 1 mmol/L (ref 0.0–2.0)
Bicarbonate: 24.4 mmol/L (ref 20.0–28.0)
Calcium, Ion: 1.12 mmol/L — ABNORMAL LOW (ref 1.15–1.40)
HCT: 40 % (ref 36.0–46.0)
Hemoglobin: 13.6 g/dL (ref 12.0–15.0)
O2 Saturation: 63 %
Potassium: 4.2 mmol/L (ref 3.5–5.1)
Sodium: 136 mmol/L (ref 135–145)
TCO2: 25 mmol/L (ref 22–32)
pCO2, Ven: 33.6 mmHg — ABNORMAL LOW (ref 44.0–60.0)
pH, Ven: 7.469 — ABNORMAL HIGH (ref 7.250–7.430)
pO2, Ven: 30 mmHg — CL (ref 32.0–45.0)

## 2020-04-07 LAB — CBC
HCT: 36.9 % (ref 36.0–46.0)
Hemoglobin: 12.8 g/dL (ref 12.0–15.0)
MCH: 31.2 pg (ref 26.0–34.0)
MCHC: 34.7 g/dL (ref 30.0–36.0)
MCV: 90 fL (ref 80.0–100.0)
Platelets: 385 10*3/uL (ref 150–400)
RBC: 4.1 MIL/uL (ref 3.87–5.11)
RDW: 11.9 % (ref 11.5–15.5)
WBC: 8.4 10*3/uL (ref 4.0–10.5)
nRBC: 0 % (ref 0.0–0.2)

## 2020-04-07 LAB — I-STAT BETA HCG BLOOD, ED (MC, WL, AP ONLY): I-stat hCG, quantitative: <5 m[IU]/mL (ref <5)

## 2020-04-07 LAB — COMPREHENSIVE METABOLIC PANEL
ALT: 19 U/L (ref 0–44)
AST: 20 U/L (ref 15–41)
Albumin: 4 g/dL (ref 3.5–5.0)
Alkaline Phosphatase: 99 U/L (ref 38–126)
Anion gap: 20 — ABNORMAL HIGH (ref 5–15)
BUN: 12 mg/dL (ref 6–20)
CO2: 17 mmol/L — ABNORMAL LOW (ref 22–32)
Calcium: 9.6 mg/dL (ref 8.9–10.3)
Chloride: 99 mmol/L (ref 98–111)
Creatinine, Ser: 1.05 mg/dL — ABNORMAL HIGH (ref 0.44–1.00)
GFR, Estimated: >60 mL/min (ref >60)
Glucose, Bld: 346 mg/dL — ABNORMAL HIGH (ref 70–99)
Potassium: 4.1 mmol/L (ref 3.5–5.1)
Sodium: 136 mmol/L (ref 135–145)
Total Bilirubin: 0.9 mg/dL (ref 0.3–1.2)
Total Protein: 7.8 g/dL (ref 6.5–8.1)

## 2020-04-07 LAB — CBG MONITORING, ED
Glucose-Capillary: 314 mg/dL — ABNORMAL HIGH (ref 70–99)
Glucose-Capillary: 335 mg/dL — ABNORMAL HIGH (ref 70–99)
Glucose-Capillary: 321 mg/dL — ABNORMAL HIGH (ref 70–99)
Glucose-Capillary: 231 mg/dL — ABNORMAL HIGH (ref 70–99)
Glucose-Capillary: 125 mg/dL — ABNORMAL HIGH (ref 70–99)

## 2020-04-07 LAB — LIPASE, BLOOD: Lipase: 14 U/L (ref 11–51)

## 2020-04-07 LAB — POC SARS CORONAVIRUS 2 AG -  ED: SARS Coronavirus 2 Ag: NEGATIVE

## 2020-04-07 LAB — BETA-HYDROXYBUTYRIC ACID: Beta-Hydroxybutyric Acid: 3.29 mmol/L — ABNORMAL HIGH (ref 0.05–0.27)

## 2020-04-07 LAB — HEMOGLOBIN A1C
Hgb A1c MFr Bld: 10.5 % — ABNORMAL HIGH (ref 4.8–5.6)
Mean Plasma Glucose: 254.65 mg/dL

## 2020-04-07 MED ORDER — HALOPERIDOL LACTATE 5 MG/ML IJ SOLN
5.0000 mg | Freq: Once | INTRAMUSCULAR | Status: AC
  Administered 2020-04-07: 5 mg via INTRAVENOUS
  Filled 2020-04-07: qty 1

## 2020-04-07 MED ORDER — INSULIN REGULAR(HUMAN) IN NACL 100-0.9 UT/100ML-% IV SOLN
INTRAVENOUS | Status: DC
  Administered 2020-04-07: 10.5 [IU]/h via INTRAVENOUS
  Filled 2020-04-07: qty 100

## 2020-04-07 MED ORDER — ONDANSETRON 4 MG PO TBDP
4.0000 mg | ORAL_TABLET | Freq: Once | ORAL | Status: AC | PRN
  Administered 2020-04-07: 4 mg via ORAL
  Filled 2020-04-07: qty 1

## 2020-04-07 MED ORDER — DEXTROSE 50 % IV SOLN: 0.0000 mL | INTRAVENOUS | Status: DC | PRN

## 2020-04-07 MED ORDER — MORPHINE SULFATE (PF) 4 MG/ML IV SOLN
4.0000 mg | Freq: Once | INTRAVENOUS | Status: AC
Start: 2020-04-07 — End: 2020-04-07
  Administered 2020-04-07: 4 mg via INTRAVENOUS
  Filled 2020-04-07: qty 1

## 2020-04-07 MED ORDER — DEXTROSE IN LACTATED RINGERS 5 % IV SOLN: INTRAVENOUS | Status: DC

## 2020-04-07 MED ORDER — LACTATED RINGERS IV SOLN: INTRAVENOUS | Status: DC

## 2020-04-07 MED ORDER — LACTATED RINGERS IV BOLUS
20.0000 mL/kg | Freq: Once | INTRAVENOUS | Status: AC
  Administered 2020-04-07: 1814 mL via INTRAVENOUS

## 2020-04-07 MED ORDER — LACTATED RINGERS IV BOLUS
1000.0000 mL | Freq: Once | INTRAVENOUS | Status: AC
  Administered 2020-04-08: 1000 mL via INTRAVENOUS

## 2020-04-07 NOTE — Discharge Instructions (Signed)
Advise you to go to the emergency department for higher level of care.  You will need IV fluids, IV medications to help with nausea/vomiting.

## 2020-04-07 NOTE — ED Triage Notes (Signed)
Pt presents with nausea, vomiting, dizziness, sore throat, chest discomfort, and shortness of breath since last week.

## 2020-04-07 NOTE — ED Notes (Signed)
Patient is being discharged from the Urgent Care and sent to the Emergency Department via personal vehicle with family member . Per Dr Leonides Grills, patient is in need of higher level of care due to possible DKA. Patient is aware and verbalizes understanding of plan of care.    Vitals:   04/07/20 1611  BP: (!) 163/104  Pulse: (!) 124  Resp: (!) 22  Temp: 98.4 F (36.9 C)  SpO2: 100%

## 2020-04-07 NOTE — ED Provider Notes (Signed)
[facility]    CSN: [id number] Arrival date & time: 04/07/20  1413      History   Chief Complaint Chief Complaint  Patient presents with  . Nausea  . Emesis  . Sore Throat  . Dizziness  . Shortness of Breath    HPI Theresa [name] is a 30 y.o. female.   Patient presents with worsening vomiting for one week. Bile in presentation. Minimal intake. Complaints of dizziness, chest discomfort, sore throat and shortness beginning within the last two days. Attempted Zofran, compazine and phenergan with no relief. History of type 1 diabetic, adherence to medication regimen. Last insulin dose evening on 1/30. Had similiar episode 06/2019 related to gallbladder.   Past Medical History:  Diagnosis Date  . Fibromyalgia   . Fibromyalgia 06/28/2019  . Headache   . Insomnia   . Insulin dependent type 1 diabetes mellitus (HCC)   . 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  Recommend follow-up Pap smear/HPV at next annual exam.  . Peripheral neuropathy   . Seasonal allergies   . 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 Fibromyalgia 06/28/2019  . Gallstones 06/27/2019  . Tonsillith 05/09/2019  . Xerostomia 05/09/2019  . Diabetes mellitus with polyneuropathy (HCC) 04/10/2019  . CN (constipation) 04/10/2019  . Allergic rhinitis 04/10/2019  . Diabetes mellitus type 2, uncontrolled (HCC) 04/10/2019  . Abdominal pain, acute, generalized 04/03/2019  . Nausea and vomiting 04/03/2019  . Liver lesion, left lobe 10/26/2017  . Hypokalemia 10/26/2017  . Anxiety 10/26/2017  . 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 Diabetes (HCC) 11/04/2015  . 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 Peripheral neuropathy, idiopathic 07/28/2015  . Insomnia 06/04/2014  . Total body  pain 04/10/2014  . Neuropathic pain of both feet 02/27/2014  . Bilateral thoracic back pain 02/07/2014  . Infection of skin and subcutaneous tissue 11/21/2013  . Peripheral neuralgia 11/21/2013  . Fibrositis 09/29/2013  . Intractable nausea and vomiting 09/29/2013  . DKA (diabetic ketoacidoses) 07/26/2013  . Type 1 diabetes mellitus (HCC) 11/22/2011  . Yeast vaginitis 11/22/2011    Past Surgical History:  Procedure Laterality Date  . CATARACT EXTRACTION W/ INTRAOCULAR LENS  IMPLANT, BILATERAL Bilateral 05/2014 - 06/2014  . CHOLECYSTECTOMY N/A 06/27/2019   Procedure: LAPAROSCOPIC CHOLECYSTECTOMY;  Surgeon: Violeta Gelinas, MD;  Location: [facility] OR;  Service: General;  Laterality: N/A;  . 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 EYE SURGERY    . MASS EXCISION Right 07/10/2013   Procedure: MINOR EXCISION RIGHT NECK CYST;  Surgeon: Drema Halon, MD;  Location: [facility];  Service: ENT;  Laterality: Right;  . Nexplanon  06/27/2018  . PARS PLANA REPAIR OF RETINAL DEATACHMENT Right 11/04/2015  . PARS PLANA VITRECTOMY Right 11/04/2015   Procedure: PARS PLANA VITRECTOMY WITH 25 GAUGE TO REPAIR A COMPLEX TRACTION RETINAL DETACHMENT;  Surgeon: Sherrie George, MD;  Location[facility] OR;  Service: Ophthalmology;  Laterality: Right;  . WISDOM TOOTH EXTRACTION  09/2015    OB History    Gravida  1   Para      Term      Preterm      AB  1   Living  0  SAB  1   IAB      Ectopic      Multiple      Live Births               Home Medications    Prior to Admission medications   Medication Sig Start Date End Date Taking? Authorizing Provider  alum & mag hydroxide-simeth (MAALOX ADVANCED MAX ST) 400-400-40 MG/5ML suspension Take 15 mLs by mouth every 6 (six) hours as needed for indigestion. 07/02/19   Nira Conn, MD  amphetamine-dextroamphetamine (ADDERALL) 20 MG tablet Take one and  1/2 tablets daily. 03/21/20   Corie Chiquito, PMHNP  ASHWAGANDHA PO Take 1 capsule by mouth daily.     [provider]  B-COMPLEX-C PO Take 1 capsule by mouth daily.     [provider]  buPROPion (WELLBUTRIN XL) 300 MG 24 hr tablet Take one tablet daily 02/25/20   Mozingo, Thereasa Solo, NP  clobetasol ointment (TEMOVATE) 0.05 % Apply 1 application topically 2 (two) times daily. 06/15/19   [provider]  dicyclomine (BENTYL) 20 MG tablet Take 1 tablet (20 mg total) by mouth 2 (two) times daily. 07/05/19   Mare Ferrari, PA-C  etonogestrel (NEXPLANON) 68 MG IMPL implant 1 each by Subdermal route continuous.     [provider]  ferrous sulfate 324 (65 Fe) MG TBEC Take 324 mg by mouth daily.  04/08/19   [provider]  Hyoscyamine Sulfate SL (LEVSIN/SL) 0.125 MG SUBL Place 1 each under the tongue 4 (four) times daily as needed for up to 5 days. Patient taking differently: Place 0.125 mg under the tongue 4 (four) times daily as needed.  07/02/19 07/07/19  Nira Conn, MD  insulin aspart (NOVOLOG FLEXPEN) 100 UNIT/ML FlexPen Inject 5-20 Units into the skin See admin instructions. Sliding scale: If blood sugar over 150, takes 5 units. Maximum 20 units.    [provider]  Insulin Degludec (TRESIBA FLEXTOUCH) 100 UNIT/ML SOPN Inject 25 Units into the skin daily.  12/14/14   [provider]  ondansetron (ZOFRAN-ODT) 4 MG disintegrating tablet Take 4 mg by mouth every 8 (eight) hours as needed for nausea or vomiting.  10/24/17   [provider]  oxyCODONE (OXY IR/ROXICODONE) 5 MG immediate release tablet 5 mg every 6 (six) hours as needed for severe pain.  07/04/19   [provider]  prochlorperazine (COMPAZINE) 10 MG tablet Take 1 tablet (10 mg total) by mouth 2 (two) times daily as needed for nausea or vomiting. 07/05/19   Mare Ferrari, PA-C  promethazine (PHENERGAN) 25 MG suppository Place 1 suppository (25 mg  total) rectally every 6 (six) hours as needed for nausea or vomiting. 07/02/19   Cardama, Amadeo Garnet, MD  Turmeric (RA TURMERIC) 500 MG CAPS Take 500 mg by mouth daily.     [provider]  venlafaxine XR (EFFEXOR-XR) 37.5 MG 24 hr capsule Take one capsule daily. 02/25/20   Mozingo, Thereasa Solo, NP  Vitamin D, Ergocalciferol, (DRISDOL) 1.25 MG (50000 UNIT) CAPS capsule Take 50,000 Units by mouth once a week. 04/27/19   [provider]  famotidine (PEPCID) 20 MG tablet Take 1 tablet (20 mg total) by mouth 2 (two) times daily. Patient not taking: Reported on 07/05/2019 10/26/17 04/07/20  Lenox Ponds, MD    Family History Family History  Problem Relation Age of Onset  . Diabetes Mother   . Migraines Mother   . Hypertension Mother   . CVA Mother   .  Breast cancer Maternal Grandmother   . Thyroid disease Maternal Grandmother   . Cancer Maternal Grandfather     Social History Social History   Tobacco Use  . Smoking status: Never Smoker  . Smokeless tobacco: Never Used  Vaping Use  . Vaping Use: Never used  Substance Use Topics  . Alcohol use: No    Alcohol/week: 0.0 standard drinks  . Drug use: No     Allergies   Tapentadol, Aloe, Duloxetine, Gabapentin, Olive oil, Pregabalin, Other, and Zonisamide   Review of Systems Review of Systems  Constitutional: Positive for appetite change. Negative for activity change, chills, diaphoresis, fatigue, fever and unexpected weight change.  HENT: Positive for sore throat. Negative for congestion, dental problem, facial swelling, sinus pressure, sinus pain, sneezing and trouble swallowing.   Respiratory: Positive for chest tightness and shortness of breath. Negative for apnea, cough, choking, wheezing and stridor.   Cardiovascular: Negative.   Gastrointestinal: Positive for nausea and vomiting. Negative for abdominal distention and abdominal pain.  Endocrine: Negative.   Neurological: Positive for dizziness.  Negative for syncope, weakness, light-headedness and headaches.  Psychiatric/Behavioral: Negative for agitation, confusion and hallucinations. The patient is nervous/anxious.      Physical Exam Triage Vital Signs ED Triage Vitals  Enc Vitals Group     BP 04/07/20 1611 (!) 163/104     Pulse Rate 04/07/20 1611 (!) 124     Resp 04/07/20 1611 (!) 22     Temp 04/07/20 1611 98.4 F (36.9 C)     Temp Source 04/07/20 1611 Oral     SpO2 04/07/20 1611 100 %     Weight --      Height --      Head Circumference --      Peak Flow --      Pain Score 04/07/20 1609 6     Pain Loc --      Pain Edu? --      Excl. in GC? --    No data found.  Updated Vital Signs BP (!) 163/104 (BP Location: Right Arm)   Pulse (!) 124   Temp 98.4 F (36.9 C) (Oral)   Resp (!) 22   SpO2 100%   Visual Acuity Right Eye Distance:   Left Eye Distance:   Bilateral Distance:    Right Eye Near:   Left Eye Near:    Bilateral Near:     Physical Exam Constitutional:      General: She is in acute distress.     Appearance: She is diaphoretic.  HENT:     Head: Normocephalic.  Eyes:     Extraocular Movements: Extraocular movements intact.  Cardiovascular:     Rate and Rhythm: Regular rhythm. Tachycardia present.     Heart sounds: Normal heart sounds.  Pulmonary:     Effort: Respiratory distress present.     Breath sounds: Normal breath sounds.  Abdominal:     General: Bowel sounds are normal.     Palpations: Abdomen is soft.  Musculoskeletal:        General: Normal range of motion.  Skin:    General: Skin is warm.  Neurological:     General: No focal deficit present.     Mental Status: She is alert and oriented to person, place, and time.  Psychiatric:        Attention and Perception: Attention normal.        Mood and Affect: Mood is anxious.        Speech:  Speech normal.        Behavior: Behavior normal.        Thought Content: Thought content normal.        Cognition and Memory: Cognition  normal.        Judgment: Judgment normal.      UC Treatments / Results  Labs (all labs ordered are listed, but only abnormal results are displayed) Labs Reviewed  CBG MONITORING, ED - Abnormal; Notable for the following components:      Result Value   Glucose-Capillary 314 (*)    All other components within normal limits    EKG   Radiology No results found.  Procedures Procedures (including critical care time)  Medications Ordered in UC Medications - No data to display  Initial Impression / Assessment and Plan / UC Course  I have reviewed the triage vital signs and the nursing notes.  Pertinent labs & imaging results that were available during my care of the patient were reviewed by me and considered in my medical decision making (see chart for details).     Final Clinical Impressions(s) / UC Diagnoses   Final diagnoses:  Intractable nausea and vomiting     Discharge Instructions     Advise you to go to the emergency department for higher level of care.  You will need IV fluids, IV medications to help with nausea/vomiting.   ED Prescriptions    None     PDMP not reviewed this encounter.   Valinda Hoar, NP 04/07/20 1642

## 2020-04-07 NOTE — ED Provider Notes (Cosign Needed Addendum)
[facility] EMERGENCY DEPARTMENT Provider Note   CSN: [id number] Arrival date & time: 04/07/20  1642     History No chief complaint on file.  Theresa [name] is a 30 y.o. female with past medical history of type 1 diabetes, fibromyalgia on chronic opioid therapy by [facility], acid reflux, cholecystectomy presents to the ED for evaluation of nausea and vomiting.  Started 1 week ago.  For the last 3 days symptoms have worsened and she has not been able to keep any fluids or foods down.  Everything is coming back up.  Vomit is light brown. No hematemesis.  At first was throwing up bile and now she is just dry heaving and not throwing up anything.  Reports dry heaving is hurting her chest and her throat, pain is described as "burning".  Sore throat but doesn't know if is from throwing up, hurts when she swallows.  Hard to breathe when actively vomiting but no frank shortness of breath otherwise. Reports generalized abdominal pain a little worse in lower abdomen.  Took zofran and promethazine and not helping. Has had mild diarrhea today as well, no melena.  Uses sliding scale insulin 5-10 units with meals, 55 units lantus at night.  States she hasn't eaten anything today and her glucose has been higher than normal.  No sick contacts. No intake of suspicious or undercooked foods. No recent antibiotics. Vaccinated for COVID 2021.  No changes in taste or smell, fever, rhinorrhea, cough, chest pain, shortness of breath, myalgias, headaches. No alcohol or recreational drug use including marijuana. H/o DKA.   HPI     Past Medical History:  Diagnosis Date  . Fibromyalgia   . Fibromyalgia 06/28/2019  . Headache   . Insomnia   . Insulin dependent type 1 diabetes mellitus ([place])   . 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  Recommend follow-up Pap smear/HPV at next annual exam.  . Peripheral neuropathy    . Seasonal allergies   . 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 Fibromyalgia 06/28/2019  . Gallstones 06/27/2019  . Tonsillith 05/09/2019  . Xerostomia 05/09/2019  . Diabetes mellitus with polyneuropathy ([place]) 04/10/2019  . CN (constipation) 04/10/2019  . Allergic rhinitis 04/10/2019  . Diabetes mellitus type 2, uncontrolled ([place]) 04/10/2019  . Abdominal pain, acute, generalized 04/03/2019  . Nausea and vomiting 04/03/2019  . Liver lesion, left lobe 10/26/2017  . Hypokalemia 10/26/2017  . Anxiety 10/26/2017  . 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 Diabetes ([place]) 11/04/2015  . 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 Peripheral neuropathy, idiopathic 07/28/2015  . Insomnia 06/04/2014  . Total body pain 04/10/2014  . Neuropathic pain of both feet 02/27/2014  . Bilateral thoracic back pain 02/07/2014  . Infection of skin and subcutaneous tissue 11/21/2013  . Peripheral neuralgia 11/21/2013  . Fibrositis 09/29/2013  . Intractable nausea and vomiting 09/29/2013  . DKA (diabetic ketoacidoses) 07/26/2013  . Type 1 diabetes mellitus ([place]) 11/22/2011  . Yeast vaginitis 11/22/2011    Past Surgical History:  Procedure Laterality Date  . CATARACT EXTRACTION W/ INTRAOCULAR LENS  IMPLANT, BILATERAL Bilateral 05/2014 - 06/2014  . CHOLECYSTECTOMY N/A 06/27/2019   Procedure: LAPAROSCOPIC CHOLECYSTECTOMY;  Surgeon: Georganna Skeans, MD;  Location: [facility];  Service: General;  Laterality: N/A;  . CYST  EXCISION Right 07/2013   Excision of chronically infected right neck cyst with culture Archie Endo 07/11/2013  . EYE SURGERY    . MASS EXCISION Right 07/10/2013   Procedure: MINOR EXCISION RIGHT NECK CYST;  Surgeon: Rozetta Nunnery, MD;  Location: [facility];  Service: ENT;  Laterality: Right;  . Nexplanon  06/27/2018  . PARS PLANA  REPAIR OF RETINAL DEATACHMENT Right 11/04/2015  . PARS PLANA VITRECTOMY Right 11/04/2015   Procedure: PARS PLANA VITRECTOMY WITH 25 GAUGE TO REPAIR A COMPLEX TRACTION RETINAL DETACHMENT;  Surgeon: Hayden Pedro, MD;  Location: [facility];  Service: Ophthalmology;  Laterality: Right;  . WISDOM TOOTH EXTRACTION  09/2015     OB History    Gravida  1   Para      Term      Preterm      AB  1   Living  0     SAB  1   IAB      Ectopic      Multiple      Live Births              Family History  Problem Relation Age of Onset  . Diabetes Mother   . Migraines Mother   . Hypertension Mother   . CVA Mother   . Breast cancer Maternal Grandmother   . Thyroid disease Maternal Grandmother   . Cancer Maternal Grandfather     Social History   Tobacco Use  . Smoking status: Never Smoker  . Smokeless tobacco: Never Used  Vaping Use  . Vaping Use: Never used  Substance Use Topics  . Alcohol use: No    Alcohol/week: 0.0 standard drinks  . Drug use: No    Home Medications Prior to Admission medications   Medication Sig Start Date End Date Taking? Authorizing Provider  alum & mag hydroxide-simeth (MAALOX ADVANCED MAX ST) 400-400-40 MG/5ML suspension Take 15 mLs by mouth every 6 (six) hours as needed for indigestion. 07/02/19   Fatima Blank, MD  amphetamine-dextroamphetamine (ADDERALL) 20 MG tablet Take one and 1/2 tablets daily. 03/21/20   Thayer Headings, PMHNP  ASHWAGANDHA PO Take 1 capsule by mouth daily.     [provider]  B-COMPLEX-C PO Take 1 capsule by mouth daily.     [provider]  buPROPion (WELLBUTRIN XL) 300 MG 24 hr tablet Take one tablet daily 02/25/20   Mozingo, Berdie Ogren, NP  clobetasol ointment (TEMOVATE) 5.17 % Apply 1 application topically 2 (two) times daily. 06/15/19   [provider]  dicyclomine (BENTYL) 20 MG tablet Take 1 tablet (20 mg total) by mouth 2 (two) times daily. 07/05/19   Garald Balding, PA-C   etonogestrel (NEXPLANON) 68 MG IMPL implant 1 each by Subdermal route continuous.     [provider]  ferrous sulfate 324 (65 Fe) MG TBEC Take 324 mg by mouth daily.  04/08/19   [provider]  Hyoscyamine Sulfate SL (LEVSIN/SL) 0.125 MG SUBL Place 1 each under the tongue 4 (four) times daily as needed for up to 5 days. Patient taking differently: Place 0.125 mg under the tongue 4 (four) times daily as needed.  07/02/19 07/07/19  Fatima Blank, MD  insulin aspart (NOVOLOG FLEXPEN) 100 UNIT/ML FlexPen Inject 5-20 Units into the skin See admin instructions. Sliding scale: If blood sugar over 150, takes 5 units. Maximum 20 units.    [provider]  Insulin Degludec (TRESIBA FLEXTOUCH) 100 UNIT/ML SOPN Inject 25  Units into the skin daily.  12/14/14   [provider]  ondansetron (ZOFRAN-ODT) 4 MG disintegrating tablet Take 4 mg by mouth every 8 (eight) hours as needed for nausea or vomiting.  10/24/17   [provider]  oxyCODONE (OXY IR/ROXICODONE) 5 MG immediate release tablet 5 mg every 6 (six) hours as needed for severe pain.  07/04/19   [provider]  prochlorperazine (COMPAZINE) 10 MG tablet Take 1 tablet (10 mg total) by mouth 2 (two) times daily as needed for nausea or vomiting. 07/05/19   Garald Balding, PA-C  promethazine (PHENERGAN) 25 MG suppository Place 1 suppository (25 mg total) rectally every 6 (six) hours as needed for nausea or vomiting. 07/02/19   Cardama, Grayce Sessions, MD  Turmeric (RA TURMERIC) 500 MG CAPS Take 500 mg by mouth daily.     [provider]  venlafaxine XR (EFFEXOR-XR) 37.5 MG 24 hr capsule Take one capsule daily. 02/25/20   Mozingo, Berdie Ogren, NP  Vitamin D, Ergocalciferol, (DRISDOL) 1.25 MG (50000 UNIT) CAPS capsule Take 50,000 Units by mouth once a week. 04/27/19   [provider]  famotidine (PEPCID) 20 MG tablet Take 1 tablet (20 mg total) by mouth 2 (two) times daily. Patient not  taking: Reported on 07/05/2019 10/26/17 04/07/20  Patrecia Pour, Christean Grief, MD    Allergies    Tapentadol, Aloe, Duloxetine, Gabapentin, Olive oil, Pregabalin, Other, and Zonisamide  Review of Systems   Review of Systems  Constitutional: Positive for diaphoresis.  Gastrointestinal: Positive for abdominal pain, diarrhea, nausea and vomiting.  All other systems reviewed and are negative.   Physical Exam Updated Vital Signs BP 114/61   Pulse (!) 126   Temp 97.8 F (36.6 C) (Oral)   Resp 15   Ht _0  (1.6 m)   Wt 90.7 kg   SpO2 99%   BMI 35.42 kg/m   Physical Exam Vitals and nursing note reviewed.  Constitutional:      Appearance: She is well-developed.     Comments: Appears uncomfortable, dry heaving no emesis   HENT:     Head: Normocephalic and atraumatic.     Nose: Nose normal.     Mouth/Throat:     Comments: Lips dry, MM tacky  Eyes:     Conjunctiva/sclera: Conjunctivae normal.  Cardiovascular:     Rate and Rhythm: Normal rate and regular rhythm.  Pulmonary:     Effort: Pulmonary effort is normal.     Breath sounds: Normal breath sounds.  Abdominal:     General: Bowel sounds are normal.     Palpations: Abdomen is soft.     Tenderness: There is abdominal tenderness (generalized).     Comments: No G/R/R. No suprapubic or CVA tenderness. Negative Murphy's and McBurney's. Active BS to lower quadrants.   Musculoskeletal:        General: Normal range of motion.     Cervical back: Normal range of motion.  Skin:    General: Skin is warm and dry.     Capillary Refill: Capillary refill takes less than 2 seconds.  Neurological:     Mental Status: She is alert.  Psychiatric:        Behavior: Behavior normal.    ED Results / Procedures / Treatments   Labs (all labs ordered are listed, but only abnormal results are displayed) Labs Reviewed  COMPREHENSIVE METABOLIC PANEL - Abnormal; Notable for the following components:      Result Value   CO2 17 (*)  Glucose, Bld 346  (*)    Creatinine, Ser 1.05 (*)    Anion gap 20 (*)    All other components within normal limits  BETA-HYDROXYBUTYRIC ACID - Abnormal; Notable for the following components:   Beta-Hydroxybutyric Acid 3.29 (*)    All other components within normal limits  HEMOGLOBIN A1C - Abnormal; Notable for the following components:   Hgb A1c MFr Bld 10.5 (*)    All other components within normal limits  CBG MONITORING, ED - Abnormal; Notable for the following components:   Glucose-Capillary 335 (*)    All other components within normal limits  I-STAT VENOUS BLOOD GAS, ED - Abnormal; Notable for the following components:   pH, Ven 7.469 (*)    pCO2, Ven 33.6 (*)    pO2, Ven 30.0 (*)    Calcium, Ion 1.12 (*)    All other components within normal limits  CBG MONITORING, ED - Abnormal; Notable for the following components:   Glucose-Capillary 321 (*)    All other components within normal limits  CBG MONITORING, ED - Abnormal; Notable for the following components:   Glucose-Capillary 231 (*)    All other components within normal limits  CBG MONITORING, ED - Abnormal; Notable for the following components:   Glucose-Capillary 125 (*)    All other components within normal limits  SARS CORONAVIRUS 2 BY RT PCR (HOSPITAL ORDER, [facility])  LIPASE, BLOOD  CBC  URINALYSIS, ROUTINE W REFLEX MICROSCOPIC  BASIC METABOLIC PANEL  I-STAT BETA HCG BLOOD, ED ([facility], [facility], [facility] ONLY)  I-STAT BETA HCG BLOOD, ED ([facility], [facility], [facility] ONLY)  POC SARS CORONAVIRUS 2 AG -  ED    EKG EKG Interpretation  Date/Time:  Monday April 07 2020 16:52:48 EST Ventricular Rate:  133 PR Interval:  130 QRS Duration: 70 QT Interval:  364 QTC Calculation: 541 R Axis:   127 Text Interpretation: Sinus tachycardia Biatrial enlargement Right axis deviation Confirmed by Octaviano Glow [phone]) on 04/07/2020 8:18:58 PM   Radiology No results found.  Procedures Procedures   Medications Ordered in ED Medications   insulin regular, human (MYXREDLIN) 100 units/ 100 mL infusion (0.5 Units/hr Intravenous Rate/Dose Change 04/07/20 2304)  lactated ringers infusion (0 mLs Intravenous Hold 04/07/20 2120)  dextrose 5 % in lactated ringers infusion ( Intravenous New Bag/Given 04/07/20 2120)  dextrose 50 % solution 0-50 mL (has no administration in time range)  lactated ringers bolus 1,000 mL (has no administration in time range)  ondansetron (ZOFRAN-ODT) disintegrating tablet 4 mg (4 mg Oral Given 04/07/20 1655)  lactated ringers bolus 1,814 mL (0 mL/kg  90.7 kg Intravenous Stopped 04/07/20 2141)  haloperidol lactate (HALDOL) injection 5 mg (5 mg Intravenous Given 04/07/20 1924)  morphine 4 MG/ML injection 4 mg (4 mg Intravenous Given 04/07/20 1924)    ED Course  I have reviewed the triage vital signs and the nursing notes.  Pertinent labs & imaging results that were available during my care of the patient were reviewed by me and considered in my medical decision making (see chart for details).  Clinical Course as of 04/07/20 2338  Mon Apr 07, 2020  1848 Glucose(!): 346 [CG]  1851 Anion gap(!): 20 [CG]  1855 pH, Ven(!): 7.469 [CG]  1856 Bicarbonate: 24.4 [CG]  1856 WBC: 8.4 [CG]  1856 Beta-Hydroxybutyric Acid(!): 3.29 [CG]  1856 CO2(!): 17 [CG]  2022 30 yo female w/ diabetes on insulin (5-10 units TID with meals, 55 units long lasting overnight) presenting to ED  with nausea and vomiting for 2-4 days.  Has not taken insulin because she's not eating.  Hx of cholecystectomy. On exam has borderline tachycardia, no focal abdominal ttp on exam.  Labs with anion gap 20, Glucose 346, no acidosis (pH 7.4), bicarb normal, Beta hydrox 3.29.  Plan to start IV insulin, IV fluids, aggressive hydration and nausea medication.  Haldol and morphine given for pain and nausea with some improvement of pain at least.  Will reassess after fluids for PO status.   [MT]  2158 Re-evaluated patient - asleep, easily arousable. Feels better.  Asking for ginger ale, tolerating PO [CG]  2315 Re-evaluated patient. Asking for italian ice, no more vomiting, repeat abd exam no tenderness. HR in low 120s  [CG]    Clinical Course User Index [CG] Kinnie Feil, PA-C [MT] Langston Masker Carola Rhine, MD   MDM Rules/Calculators/A&P                          30 y.o. yo with chief complaint of nausea, vomiting, abdominal pain, diarrhea, decreased p.o. intake for the last week.  Pertinent PMH includes type 1 diabetes, previous DKA, fibromyalgia on chronic oxycodone.  Vaccinated for COVID March 2021.  No booster. Denies marijuana use.   On arrival she is tachycardic in the 120s, appears uncomfortable but nontoxic.  Dry heaving.  Generalized abdominal tenderness.  Previous medical records available, triage and nursing notes reviewed to obtain more history and assist with MDM  Additional information obtained from her aunt at bedside  Chief complain involves an extensive number of treatment options and is a complaint that carries with it a high risk of complications and morbidity and mortality.    Differential diagnosis: Diabetic emergency like hyperglycemia, DKA/HHS, gastroparesis, gastroenteritis, viral illness including COVID-19.  She has generalized very mild abdominal tenderness.  No peritonitis.  No fever.  Other acute intra-abdominal/pelvic process like appendicitis, diverticulitis, pancreatitis, SBO considered but less likely given overall presentation.  ER lab work and imaging ordered by triage RN and me, as above  I have personally visualized and interpreted ER diagnostic work up including labs and imaging.    Labs reveal -normal WBC, hemoglobin.  Glucose 346, anion gap 20, bicarb essentially normal, other electrolytes normal as well.  pH is 7.  5.  Beta hydroxy 3.29.  Hemoglobin A1c 10.5.  hCG is negative.  Imaging reveals -EKG shows sinus tachycardia heart rate 133, prolonged QTC 541.  ER work-up was consistent with diabetic ketosis  with normal pH.  Medications ordered -Haldol, morphine.  Patient was placed on Endo tool for LR bolus and insulin. Has received 1814 cc LR bolus.   Ordered continuous cardiac and pulse ox monitoring.  Will plan for serial re-examinations. Close monitoring.  We will recheck to see if we can close her anion gap.  2228: Re-evaluated the patient.   Has had complete resolution of abdominal pain and nausea/vomiting, now drinking ginger ale.  CBG is improving now 231.  We will give additional LR bolus, recheck be met, if gap has improved anticipate discharge.  Remains tachycardic in 120s.  Rectal temp 99.2.   Patient care transferred to oncoming team who will re-evaluate after additional 1 L LR bolus for improvement of HR and BMET, UA, CXR, COVID.  If improvement anticipate discharge with low dose anti-emetic given QTc.  Consider admission is persistently tachycardic or persistent AG.   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)  Rx / DC Orders ED Discharge Orders    None         Arlean Hopping 04/07/20 2344    Wyvonnia Dusky, MD 04/08/20 [phone]

## 2020-04-07 NOTE — ED Triage Notes (Signed)
Pt c/o nausea, vomiting, chest pain and shortness of breath x 1 week. Reports worsening in the last 24 hours.

## 2020-04-08 ENCOUNTER — Encounter (HOSPITAL_COMMUNITY): Payer: Self-pay | Admitting: Family Medicine

## 2020-04-08 DIAGNOSIS — J302 Other seasonal allergic rhinitis: Secondary | ICD-10-CM | POA: Diagnosis present

## 2020-04-08 DIAGNOSIS — G4700 Insomnia, unspecified: Secondary | ICD-10-CM | POA: Diagnosis present

## 2020-04-08 DIAGNOSIS — Z20822 Contact with and (suspected) exposure to covid-19: Secondary | ICD-10-CM | POA: Diagnosis present

## 2020-04-08 DIAGNOSIS — R112 Nausea with vomiting, unspecified: Secondary | ICD-10-CM

## 2020-04-08 DIAGNOSIS — Z79891 Long term (current) use of opiate analgesic: Secondary | ICD-10-CM | POA: Diagnosis not present

## 2020-04-08 DIAGNOSIS — F418 Other specified anxiety disorders: Secondary | ICD-10-CM | POA: Diagnosis not present

## 2020-04-08 DIAGNOSIS — F32A Depression, unspecified: Secondary | ICD-10-CM | POA: Diagnosis present

## 2020-04-08 DIAGNOSIS — Z6835 Body mass index (BMI) 35.0-35.9, adult: Secondary | ICD-10-CM | POA: Diagnosis not present

## 2020-04-08 DIAGNOSIS — F909 Attention-deficit hyperactivity disorder, unspecified type: Secondary | ICD-10-CM | POA: Diagnosis present

## 2020-04-08 DIAGNOSIS — E1043 Type 1 diabetes mellitus with diabetic autonomic (poly)neuropathy: Secondary | ICD-10-CM | POA: Diagnosis present

## 2020-04-08 DIAGNOSIS — Z794 Long term (current) use of insulin: Secondary | ICD-10-CM | POA: Diagnosis not present

## 2020-04-08 DIAGNOSIS — R9431 Abnormal electrocardiogram [ECG] [EKG]: Secondary | ICD-10-CM | POA: Diagnosis present

## 2020-04-08 DIAGNOSIS — E1010 Type 1 diabetes mellitus with ketoacidosis without coma: Secondary | ICD-10-CM | POA: Diagnosis present

## 2020-04-08 DIAGNOSIS — F419 Anxiety disorder, unspecified: Secondary | ICD-10-CM | POA: Diagnosis present

## 2020-04-08 DIAGNOSIS — Z823 Family history of stroke: Secondary | ICD-10-CM | POA: Diagnosis not present

## 2020-04-08 DIAGNOSIS — K219 Gastro-esophageal reflux disease without esophagitis: Secondary | ICD-10-CM | POA: Diagnosis present

## 2020-04-08 DIAGNOSIS — E1310 Other specified diabetes mellitus with ketoacidosis without coma: Secondary | ICD-10-CM

## 2020-04-08 DIAGNOSIS — Z8249 Family history of ischemic heart disease and other diseases of the circulatory system: Secondary | ICD-10-CM | POA: Diagnosis not present

## 2020-04-08 DIAGNOSIS — E876 Hypokalemia: Secondary | ICD-10-CM | POA: Diagnosis present

## 2020-04-08 DIAGNOSIS — E1065 Type 1 diabetes mellitus with hyperglycemia: Secondary | ICD-10-CM | POA: Diagnosis present

## 2020-04-08 DIAGNOSIS — M797 Fibromyalgia: Secondary | ICD-10-CM | POA: Diagnosis present

## 2020-04-08 DIAGNOSIS — Z833 Family history of diabetes mellitus: Secondary | ICD-10-CM | POA: Diagnosis not present

## 2020-04-08 HISTORY — DX: Nausea with vomiting, unspecified: R11.2

## 2020-04-08 LAB — CBG MONITORING, ED
Glucose-Capillary: 176 mg/dL — ABNORMAL HIGH (ref 70–99)
Glucose-Capillary: 185 mg/dL — ABNORMAL HIGH (ref 70–99)
Glucose-Capillary: 136 mg/dL — ABNORMAL HIGH (ref 70–99)
Glucose-Capillary: 117 mg/dL — ABNORMAL HIGH (ref 70–99)
Glucose-Capillary: 108 mg/dL — ABNORMAL HIGH (ref 70–99)

## 2020-04-08 LAB — BASIC METABOLIC PANEL
Anion gap: 10 (ref 5–15)
Anion gap: 11 (ref 5–15)
BUN: 11 mg/dL (ref 6–20)
BUN: 10 mg/dL (ref 6–20)
CO2: 26 mmol/L (ref 22–32)
CO2: 24 mmol/L (ref 22–32)
Calcium: 9 mg/dL (ref 8.9–10.3)
Calcium: 8.9 mg/dL (ref 8.9–10.3)
Chloride: 102 mmol/L (ref 98–111)
Chloride: 102 mmol/L (ref 98–111)
Creatinine, Ser: 0.91 mg/dL (ref 0.44–1.00)
Creatinine, Ser: 0.82 mg/dL (ref 0.44–1.00)
GFR, Estimated: >60 mL/min (ref >60)
GFR, Estimated: >60 mL/min (ref >60)
Glucose, Bld: 125 mg/dL — ABNORMAL HIGH (ref 70–99)
Glucose, Bld: 113 mg/dL — ABNORMAL HIGH (ref 70–99)
Potassium: 3.3 mmol/L — ABNORMAL LOW (ref 3.5–5.1)
Potassium: 3.5 mmol/L (ref 3.5–5.1)
Sodium: 138 mmol/L (ref 135–145)
Sodium: 137 mmol/L (ref 135–145)

## 2020-04-08 LAB — CBC
HCT: 30.8 % — ABNORMAL LOW (ref 36.0–46.0)
Hemoglobin: 10.5 g/dL — ABNORMAL LOW (ref 12.0–15.0)
MCH: 32 pg (ref 26.0–34.0)
MCHC: 34.1 g/dL (ref 30.0–36.0)
MCV: 93.9 fL (ref 80.0–100.0)
Platelets: 294 10*3/uL (ref 150–400)
RBC: 3.28 MIL/uL — ABNORMAL LOW (ref 3.87–5.11)
RDW: 12.1 % (ref 11.5–15.5)
WBC: 9.1 10*3/uL (ref 4.0–10.5)
nRBC: 0 % (ref 0.0–0.2)

## 2020-04-08 LAB — URINALYSIS, ROUTINE W REFLEX MICROSCOPIC
Bacteria, UA: NONE SEEN
Bilirubin Urine: NEGATIVE
Glucose, UA: >=500 mg/dL — AB
Hgb urine dipstick: NEGATIVE
Ketones, ur: 80 mg/dL — AB
Leukocytes,Ua: NEGATIVE
Nitrite: NEGATIVE
Protein, ur: NEGATIVE mg/dL
Specific Gravity, Urine: 1.022 (ref 1.005–1.030)
pH: 7 (ref 5.0–8.0)

## 2020-04-08 LAB — MAGNESIUM: Magnesium: 1.3 mg/dL — ABNORMAL LOW (ref 1.7–2.4)

## 2020-04-08 LAB — GLUCOSE, CAPILLARY
Glucose-Capillary: 161 mg/dL — ABNORMAL HIGH (ref 70–99)
Glucose-Capillary: 169 mg/dL — ABNORMAL HIGH (ref 70–99)

## 2020-04-08 LAB — HIV ANTIBODY (ROUTINE TESTING W REFLEX): HIV Screen 4th Generation wRfx: NONREACTIVE

## 2020-04-08 LAB — SARS CORONAVIRUS 2 BY RT PCR (HOSPITAL ORDER, PERFORMED IN ~~LOC~~ HOSPITAL LAB): SARS Coronavirus 2: NEGATIVE

## 2020-04-08 MED ORDER — POTASSIUM CHLORIDE 10 MEQ/100ML IV SOLN
10.0000 meq | INTRAVENOUS | Status: AC
  Administered 2020-04-08 (×3): 10 meq via INTRAVENOUS
  Filled 2020-04-08 (×4): qty 100

## 2020-04-08 MED ORDER — LORAZEPAM 2 MG/ML IJ SOLN
1.0000 mg | Freq: Once | INTRAMUSCULAR | Status: AC
  Administered 2020-04-08: 1 mg via INTRAVENOUS
  Filled 2020-04-08: qty 1

## 2020-04-08 MED ORDER — FENTANYL CITRATE (PF) 100 MCG/2ML IJ SOLN: 25.0000 ug | INTRAMUSCULAR | Status: DC | PRN

## 2020-04-08 MED ORDER — POTASSIUM CHLORIDE 10 MEQ/100ML IV SOLN: 10.0000 meq | INTRAVENOUS | Status: DC

## 2020-04-08 MED ORDER — POTASSIUM CHLORIDE 2 MEQ/ML IV SOLN
INTRAVENOUS | Status: DC
  Filled 2020-04-08: qty 1000

## 2020-04-08 MED ORDER — SODIUM CHLORIDE 0.9 % IV SOLN: INTRAVENOUS | Status: AC

## 2020-04-08 MED ORDER — POTASSIUM CHLORIDE 10 MEQ/100ML IV SOLN
10.0000 meq | INTRAVENOUS | Status: AC
  Administered 2020-04-08 (×5): 10 meq via INTRAVENOUS
  Filled 2020-04-08 (×4): qty 100

## 2020-04-08 MED ORDER — FAMOTIDINE IN NACL 20-0.9 MG/50ML-% IV SOLN
20.0000 mg | Freq: Two times a day (BID) | INTRAVENOUS | Status: DC
  Administered 2020-04-08 – 2020-04-10 (×6): 20 mg via INTRAVENOUS
  Filled 2020-04-08 (×7): qty 50

## 2020-04-08 MED ORDER — INSULIN GLARGINE 100 UNIT/ML ~~LOC~~ SOLN
10.0000 [IU] | SUBCUTANEOUS | Status: DC
  Administered 2020-04-08 – 2020-04-09 (×2): 10 [IU] via SUBCUTANEOUS
  Filled 2020-04-08 (×3): qty 0.1

## 2020-04-08 MED ORDER — POTASSIUM CHLORIDE 10 MEQ/100ML IV SOLN
10.0000 meq | INTRAVENOUS | Status: DC
  Filled 2020-04-08: qty 100

## 2020-04-08 MED ORDER — LORAZEPAM 2 MG/ML IJ SOLN: 0.5000 mg | INTRAMUSCULAR | Status: DC | PRN

## 2020-04-08 MED ORDER — PROMETHAZINE HCL 25 MG/ML IJ SOLN: 12.5000 mg | Freq: Four times a day (QID) | INTRAMUSCULAR | Status: DC | PRN

## 2020-04-08 MED ORDER — ONDANSETRON HCL 4 MG/2ML IJ SOLN: 4.0000 mg | Freq: Four times a day (QID) | INTRAMUSCULAR | Status: DC | PRN

## 2020-04-08 MED ORDER — INSULIN ASPART 100 UNIT/ML ~~LOC~~ SOLN
0.0000 [IU] | SUBCUTANEOUS | Status: DC
  Administered 2020-04-08: 1 [IU] via SUBCUTANEOUS
  Administered 2020-04-08 – 2020-04-09 (×3): 2 [IU] via SUBCUTANEOUS
  Administered 2020-04-09 (×2): 1 [IU] via SUBCUTANEOUS
  Administered 2020-04-10: 5 [IU] via SUBCUTANEOUS
  Administered 2020-04-10: 2 [IU] via SUBCUTANEOUS

## 2020-04-08 MED ORDER — METOCLOPRAMIDE HCL 5 MG/ML IJ SOLN
5.0000 mg | Freq: Three times a day (TID) | INTRAMUSCULAR | Status: AC
  Administered 2020-04-08 (×2): 5 mg via INTRAVENOUS
  Filled 2020-04-08 (×2): qty 2

## 2020-04-08 MED ORDER — ACETAMINOPHEN 650 MG RE SUPP: 650.0000 mg | Freq: Four times a day (QID) | RECTAL | Status: DC | PRN

## 2020-04-08 MED ORDER — MAGNESIUM SULFATE IN D5W 1-5 GM/100ML-% IV SOLN
1.0000 g | Freq: Once | INTRAVENOUS | Status: AC
  Administered 2020-04-08: 1 g via INTRAVENOUS
  Filled 2020-04-08: qty 100

## 2020-04-08 MED ORDER — ENOXAPARIN SODIUM 40 MG/0.4ML ~~LOC~~ SOLN
40.0000 mg | SUBCUTANEOUS | Status: DC
  Administered 2020-04-08 – 2020-04-10 (×3): 40 mg via SUBCUTANEOUS
  Filled 2020-04-08 (×3): qty 0.4

## 2020-04-08 MED ORDER — HYDROCODONE-ACETAMINOPHEN 5-325 MG PO TABS
1.0000 | ORAL_TABLET | Freq: Once | ORAL | Status: AC
  Administered 2020-04-08: 1 via ORAL
  Filled 2020-04-08: qty 1

## 2020-04-08 MED ORDER — MAGNESIUM SULFATE 2 GM/50ML IV SOLN
2.0000 g | Freq: Once | INTRAVENOUS | Status: AC
  Administered 2020-04-08: 2 g via INTRAVENOUS
  Filled 2020-04-08: qty 50

## 2020-04-08 MED ORDER — ACETAMINOPHEN 325 MG PO TABS: 650.0000 mg | ORAL_TABLET | Freq: Four times a day (QID) | ORAL | Status: DC | PRN

## 2020-04-08 MED ORDER — POTASSIUM CHLORIDE 10 MEQ/100ML IV SOLN
10.0000 meq | INTRAVENOUS | Status: DC
Start: 2020-04-08 — End: 2020-04-08
  Administered 2020-04-08: 10 meq via INTRAVENOUS
  Filled 2020-04-08 (×2): qty 100

## 2020-04-08 NOTE — Progress Notes (Addendum)
Inpatient Diabetes Program Recommendations  AACE/ADA: New Consensus Statement on Inpatient Glycemic Control (2015)  Target Ranges:  Prepandial:   less than 140 mg/dL      Peak postprandial:   less than 180 mg/dL (1-2 hours)      Critically ill patients:  140 - 180 mg/dL   Lab Results  Component Value Date   GLUCAP 108 (H) 04/08/2020   HGBA1C 10.5 (H) 04/07/2020    Review of Glycemic Control Results for QUINTANA, CANELO (MRN [id number]) as of 04/08/2020 16:20  Ref. Range 04/08/2020 00:58 04/08/2020 02:21 04/08/2020 03:40 04/08/2020 07:55 04/08/2020 11:37  Glucose-Capillary Latest Ref Range: 70 - 99 mg/dL 956 (H) 387 (H) 564 (H) 117 (H) 108 (H)   Diabetes history: DM 1 Outpatient Diabetes medications:  Tresiba 42 units daily (max dose 50 units daily), Novolog 9 units per carbohydrate exchange (15 grams) plu 1 units for every 25 mg/dL>100 mg/dL Current orders for Inpatient glycemic control:  Lantus 10 units q HS Novolog sensitive q 4 hours  Inpatient Diabetes Program Recommendations:    Spoke with patient regarding DM diagnosis.  She has had DM since she was 30 y.o. Discussed current A1C.  Patient did not seem surprised.  Discussed potential of CGM to help with monitoring.  Patient states that Dr. Sharl Ma has discussed with her in the past, but she is not interested.  Briefly discussed the advantages.  Patient states that she did take her basal insulin while sick but adjusted the dose.  To start clear liquid diet this evening.  She states she has no needs at this time. Will likely need more insulin based on home insulin regimen.  Will follow.   Thanks,  Beryl Meager, RN, BC-ADM Inpatient Diabetes Coordinator Pager [phone] (8a-5p)

## 2020-04-08 NOTE — H&P (Addendum)
History and Physical    Theresa [name] NAT:[id number] DOB: 10-20-1990 DOA: 04/07/2020  PCP: Regis Paris, NP   Patient coming from: Home   Chief Complaint: N/V, lightheadedness, chest discomfort   HPI: Theresa [name] is a 30 y.o. female with medical history significant for depression, anxiety, ADHD, and insulin-dependent diabetes mellitus, now presenting to the emergency department for evaluation of nausea, vomiting, lightheadedness, and chest discomfort.  Patient reports that symptoms began a few days ago, she was not taking her insulin due to not eating or drinking much, and has had progressive worsening.  In addition to the nausea and nonbloody vomiting, she has developed a burning discomfort in her central chest and lightheadedness.  She denies any fevers, chills, dysuria, or flank pain.  No lower abdominal or suprapubic pain.  ED Course: Upon arrival to the ED, patient is found to be afebrile, saturating well on room air, tachycardic, and with stable blood pressure.  EKG features sinus tachycardia with rate 133, RAD, and QTc intervals 541 ms.  Chest x-ray negative for acute cardiopulmonary disease.  Urinalysis with glucosuria and ketonuria.  CBC unremarkable.  Initial chemistry panel featured a glucose 346 with bicarbonate 17 and anion gap 20.  She was treated with IV fluids, insulin infusion, morphine, Ativan, Zofran, and Haldol in the ED.  Glucose improved to 125, anion gap and bicarbonate normalized, but the patient continues to have nausea, vomiting, and is unable to tolerate any oral intake.  Review of Systems:  All other systems reviewed and apart from HPI, are negative.  Past Medical History:  Diagnosis Date  . Fibromyalgia   . Fibromyalgia 06/28/2019  . Headache   . Insomnia   . Insulin dependent type 1 diabetes mellitus (HCC)   . 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  Recommend follow-up Pap  smear/HPV at next annual exam.  . Peripheral neuropathy   . Seasonal allergies   . 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 CATARACT EXTRACTION W/ INTRAOCULAR LENS  IMPLANT, BILATERAL Bilateral 05/2014 - 06/2014  . CHOLECYSTECTOMY N/A 06/27/2019   Procedure: LAPAROSCOPIC CHOLECYSTECTOMY;  Surgeon: Violeta Gelinas, MD;  Location: [facility] OR;  Service: General;  Laterality: N/A;  . 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 EYE SURGERY    . MASS EXCISION Right 07/10/2013   Procedure: MINOR EXCISION RIGHT NECK CYST;  Surgeon: Drema Halon, MD;  Location: [facility];  Service: ENT;  Laterality: Right;  . Nexplanon  06/27/2018  . PARS PLANA REPAIR OF RETINAL DEATACHMENT Right 11/04/2015  . PARS PLANA VITRECTOMY Right 11/04/2015   Procedure: PARS PLANA VITRECTOMY WITH 25 GAUGE TO REPAIR A COMPLEX TRACTION RETINAL DETACHMENT;  Surgeon: Sherrie George, MD;  Location: [facility] OR;  Service: Ophthalmology;  Laterality: Right;  . WISDOM TOOTH EXTRACTION  09/2015    Social History:   reports that she has never smoked. She has never used smokeless tobacco. She reports that she does not drink alcohol and does not use drugs.  Allergies  Allergen Reactions  . Tapentadol Swelling  . Aloe Hives  . Duloxetine Other (See Comments)    RESTLESSNESS URGES TO MOVE  . Gabapentin Swelling    SWELLING REACTION OF FEET Other reaction(s): edema  . Olive Oil Hives  . Pregabalin Other (See Comments)  Heart palpitations Fainting Other reaction(s): chest pain, sweats  . Other     Other reaction(s): rash  . Zonisamide Other (See Comments)    WORSENING PAIN    Family History  Problem Relation Age of Onset  . Diabetes Mother   . Migraines Mother   . Hypertension Mother   . CVA Mother   . Breast cancer Maternal Grandmother   . Thyroid disease  Maternal Grandmother   . Cancer Maternal Grandfather      Prior to Admission medications   Medication Sig Start Date End Date Taking? Authorizing Provider  alum & mag hydroxide-simeth (MAALOX ADVANCED MAX ST) 400-400-40 MG/5ML suspension Take 15 mLs by mouth every 6 (six) hours as needed for indigestion. 07/02/19   Nira Conn, MD  amphetamine-dextroamphetamine (ADDERALL) 20 MG tablet Take one and 1/2 tablets daily. 03/21/20   Corie Chiquito, PMHNP  ASHWAGANDHA PO Take 1 capsule by mouth daily.     [provider]  B-COMPLEX-C PO Take 1 capsule by mouth daily.     [provider]  buPROPion (WELLBUTRIN XL) 300 MG 24 hr tablet Take one tablet daily 02/25/20   Mozingo, Thereasa Solo, NP  clobetasol ointment (TEMOVATE) 0.05 % Apply 1 application topically 2 (two) times daily. 06/15/19   [provider]  dicyclomine (BENTYL) 20 MG tablet Take 1 tablet (20 mg total) by mouth 2 (two) times daily. 07/05/19   Mare Ferrari, PA-C  etonogestrel (NEXPLANON) 68 MG IMPL implant 1 each by Subdermal route continuous.     [provider]  ferrous sulfate 324 (65 Fe) MG TBEC Take 324 mg by mouth daily.  04/08/19   [provider]  Hyoscyamine Sulfate SL (LEVSIN/SL) 0.125 MG SUBL Place 1 each under the tongue 4 (four) times daily as needed for up to 5 days. Patient taking differently: Place 0.125 mg under the tongue 4 (four) times daily as needed.  07/02/19 07/07/19  Nira Conn, MD  insulin aspart (NOVOLOG FLEXPEN) 100 UNIT/ML FlexPen Inject 5-20 Units into the skin See admin instructions. Sliding scale: If blood sugar over 150, takes 5 units. Maximum 20 units.    [provider]  Insulin Degludec (TRESIBA FLEXTOUCH) 100 UNIT/ML SOPN Inject 25 Units into the skin daily.  12/14/14   [provider]  ondansetron (ZOFRAN-ODT) 4 MG disintegrating tablet Take 4 mg by mouth every 8 (eight) hours as needed for nausea or vomiting.  10/24/17    [provider]  oxyCODONE (OXY IR/ROXICODONE) 5 MG immediate release tablet 5 mg every 6 (six) hours as needed for severe pain.  07/04/19   [provider]  prochlorperazine (COMPAZINE) 10 MG tablet Take 1 tablet (10 mg total) by mouth 2 (two) times daily as needed for nausea or vomiting. 07/05/19   Mare Ferrari, PA-C  promethazine (PHENERGAN) 25 MG suppository Place 1 suppository (25 mg total) rectally every 6 (six) hours as needed for nausea or vomiting. 07/02/19   Cardama, Amadeo Garnet, MD  Turmeric (RA TURMERIC) 500 MG CAPS Take 500 mg by mouth daily.     [provider]  venlafaxine XR (EFFEXOR-XR) 37.5 MG 24 hr capsule Take one capsule daily. 02/25/20   Mozingo, Thereasa Solo, NP  Vitamin D, Ergocalciferol, (DRISDOL) 1.25 MG (50000 UNIT) CAPS capsule Take 50,000 Units by mouth once a week. 04/27/19   [provider]  famotidine (PEPCID) 20 MG tablet Take 1 tablet (20 mg total) by mouth 2 (two) times daily. Patient not taking: Reported on  07/05/2019 10/26/17 04/07/20  Lenox Ponds, MD    Physical Exam: Vitals:   04/08/20 0130 04/08/20 0200 04/08/20 0230 04/08/20 0300  BP: 136/72 110/61 108/63 129/86  Pulse: (!) 126 (!) 125 (!) 124 (!) 126  Resp: 20 (!) 21 19 (!) 21  Temp:      TempSrc:      SpO2: 99% 100% 100% 100%  Weight:      Height:        Constitutional: NAD, calm  Eyes: PERTLA, lids and conjunctivae normal ENMT: Mucous membranes are moist. Posterior pharynx clear of any exudate or lesions.   Neck: normal, supple, no masses, no thyromegaly Respiratory:  no wheezing, no crackles. No accessory muscle use.  Cardiovascular: Rate ~120 and regular. No extremity edema.   Abdomen: No distension, no tenderness, soft. Bowel sounds active.  Musculoskeletal: no clubbing / cyanosis. No joint deformity upper and lower extremities.   Skin: Scattered hyperpigmented macules. Warm, dry, well-perfused. Neurologic: CN 2-12 grossly intact. Sensation  intac. Moving all extremities.  Psychiatric: Somnolent, wakes to voice and oriented to person, place, and situation. Very pleasant and cooperative.    Labs and Imaging on Admission: I have personally reviewed following labs and imaging studies  CBC: Recent Labs  Lab 04/07/20 1702 04/07/20 1825  WBC 8.4  --   HGB 12.8 13.6  HCT 36.9 40.0  MCV 90.0  --   PLT 385  --    Basic Metabolic Panel: Recent Labs  Lab 04/07/20 1702 04/07/20 1825 04/07/20 2200  NA 136 136 138  K 4.1 4.2 3.3*  CL 99  --  102  CO2 17*  --  26  GLUCOSE 346*  --  125*  BUN 12  --  11  CREATININE 1.05*  --  0.91  CALCIUM 9.6  --  9.0   GFR: Estimated Creatinine Clearance: 96.6 mL/min (by C-G formula based on SCr of 0.91 mg/dL). Liver Function Tests: Recent Labs  Lab 04/07/20 1702  AST 20  ALT 19  ALKPHOS 99  BILITOT 0.9  PROT 7.8  ALBUMIN 4.0   Recent Labs  Lab 04/07/20 1702  LIPASE 14   No results for input(s): AMMONIA in the last 168 hours. Coagulation Profile: No results for input(s): INR, PROTIME in the last 168 hours. Cardiac Enzymes: No results for input(s): CKTOTAL, CKMB, CKMBINDEX, TROPONINI in the last 168 hours. BNP (last 3 results) No results for input(s): PROBNP in the last 8760 hours. HbA1C: Recent Labs    04/07/20 1933  HGBA1C 10.5*   CBG: Recent Labs  Lab 04/07/20 1950 04/07/20 2118 04/07/20 2259 04/08/20 0058 04/08/20 0221  GLUCAP 321* 231* 125* 176* 185*   Lipid Profile: No results for input(s): CHOL, HDL, LDLCALC, TRIG, CHOLHDL, LDLDIRECT in the last 72 hours. Thyroid Function Tests: No results for input(s): TSH, T4TOTAL, FREET4, T3FREE, THYROIDAB in the last 72 hours. Anemia Panel: No results for input(s): VITAMINB12, FOLATE, FERRITIN, TIBC, IRON, RETICCTPCT in the last 72 hours. Urine analysis:    Component Value Date/Time   COLORURINE YELLOW 04/08/2020 0106   APPEARANCEUR HAZY (A) 04/08/2020 0106   LABSPEC 1.022 04/08/2020 0106   PHURINE 7.0  04/08/2020 0106   GLUCOSEU >=500 (A) 04/08/2020 0106   HGBUR NEGATIVE 04/08/2020 0106   BILIRUBINUR NEGATIVE 04/08/2020 0106   BILIRUBINUR neg 07/19/2013 1519   KETONESUR 80 (A) 04/08/2020 0106   PROTEINUR NEGATIVE 04/08/2020 0106   UROBILINOGEN 0.2 02/25/2014 0524   NITRITE NEGATIVE 04/08/2020 0106   LEUKOCYTESUR NEGATIVE 04/08/2020 0106  Sepsis Labs: @LABRCNTIP (procalcitonin:4,lacticidven:4) )No results found for this or any previous visit (from the past 240 hour(s)).   Radiological Exams on Admission: DG Chest Portable 1 View  Result Date: 04/08/2020 CLINICAL DATA:  Chest pain, nausea, vomiting, dizziness, sore throat, chest discomfort and shortness of breath EXAM: PORTABLE CHEST 1 VIEW COMPARISON:  Radiograph 07/05/2019 FINDINGS: Accounting for body habitus, the lungs are clear. No consolidation, features of edema, pneumothorax, or effusion. Pulmonary vascularity is normally distributed. The cardiomediastinal contours are unremarkable. No acute osseous or soft tissue abnormality. Telemetry leads overlie the chest. IMPRESSION: No acute cardiopulmonary abnormality. Electronically Signed   By: 07/07/2019 M.D.   On: 04/08/2020 00:17    EKG: Independently reviewed. Sinus tachycardia, rate 133, RAD, QTc 541 ms.   Assessment/Plan   1. Intractable N/V  - Presents with progressive N/V over the past few days, was in mild DKA which has resolved with IVF and insulin, but she continues to have N/V and unable to tolerate any oral intake  - Abdominal exam is benign, no infectious sxs, likely related to uncontrolled DM  - Treatment has been complicated by prolonged QT  - Continue IVF hydration, monitor electrolytes, address QT prolongation as below, and use prn Ativan for now    ADDENDUM: QT has normalized after potassium and magnesium replacement. Will stop Ativan and use prn Zofran as first choice with Phenergan for refractory N/V.    2. Insulin-dependent DM  - A1c is 10.5%  - Present  with N/V, has not been taking insulin due to not eating, and was found to be in mild DKA which has resolved with IVF and insulin  - She is unable to tolerate any oral intake on admission due to ongoing N/V  - Transition to sq insulin, consult diabetes coordinator   3. Prolonged QT interval  - QTc is 541 ms in ED  - Continue cardiac monitoring, replace potassium, check mag level, minimize QT prolonging medications, and repeat EKG    4. Hypokalemia  - Serum potassium is 3.3 in setting of insulin and N/V  - Replace, repeat chem panel    5. Depression, anxiety, ADHD  - She is sedated on admission after Haldol and Ativan in ED  - Follow-up pharmacy medication reconciliation    DVT prophylaxis: Loveneox  Code Status: Full  Level of Care: Level of care: Telemetry Medical Family Communication: Discussed with patient  Disposition Plan:  Patient is from: Home  Anticipated d/c is to: Home  Anticipated d/c date is: 04/09/20 Patient currently: Pending tolerance of oral intake  Consults called: None  Admission status: Observation     06/07/20, MD [facility]  04/08/2020, 3:18 AM

## 2020-04-08 NOTE — ED Notes (Addendum)
This RN requested that MD Robb Matar put in an order for Potassium recheck after pt had received a total of 4 runs plus LR + KCl IV maintenance infusion that ran for several hours. Pt has two runs of potassium still to complete. MD Robb Matar denied RN request.

## 2020-04-08 NOTE — Progress Notes (Signed)
[facility] PROGRESS NOTE    Progress Note  Theresa [name]  BVQ:[id number] DOB: 03/26/1990 DOA: 04/07/2020 PCP: Aleina Paris, NP     Brief Narrative:   Theresa [name] is an 30 y.o. female significant for depression anxiety ADHD and insulin-dependent diabetes mellitus presents to the ED for intractable nausea vomiting lightheadedness and chest discomfort.  In the ED she was found tachycardic blood pressure stable sinus tach QTC of five hundred and forty urinalysis shows ketones with glucose, her blood glucose was three forty-six bicarbonate of seventeen anion gap of twenty Significant Events: 04/08/2020 admission for DKA Significant studies: 04/08/2020 chest x-ray showed no acute abnormalities  Antibiotics: None  Microbiology data: Blood culture:  Procedures: None  Assessment/Plan:   DKA/insulin-dependent diabetes mellitus type 2: With an A1c of 10.5. On insulin drip IV fluids her anion gap closed her bicarb corrected. She was transitioned to long-acting insulin plus sliding scale been well controlled.  Intractable nausea and vomiting Likely due to DKA, abdominal pain is benign she has remained afebrile no leukocytosis no source of infection. IV fluids and conservative management.  Prolonged QTC: Likely due to electrolyte imbalance after potassium and magnesium replacement QTC normalized.  Hypokalemia: After repletion, was 3.5 we will continue to replete try to keep it above four.  Hypomagnesemia: Try to keep above two.  Depression/anxiety: Resume home medications.  DVT prophylaxis: lovenox Family Communication:none Status is: Observation  The patient remains OBS appropriate and will d/c before 2 midnights.  Dispo: The patient is from: Home              Anticipated d/c is to: Home              Anticipated d/c date is: 1 day              Patient currently is not medically stable to d/c.   Difficult to place patient No        Code Status:      Code Status Orders  (From admission, onward)         Start     Ordered   04/08/20 0316  Full code  Continuous        04/08/20 0318        Code Status History    Date Active Date Inactive Code Status Order ID Comments User Context   06/27/2019 0614 06/30/2019 2013 Full Code [id number]  Emelia Loron, MD ED   04/03/2019 0441 04/04/2019 2307 Full Code [id number]  Haydee Monica, MD ED   10/26/2017 0038 10/26/2017 2042 Full Code [id number]  Briscoe Deutscher, MD ED   11/04/2015 1612 11/05/2015 1112 Full Code [id number]  Sherrie George, MD Inpatient   09/29/2013 0407 10/01/2013 1226 Full Code [id number]  Vassie Loll, MD Inpatient   07/26/2013 1044 07/26/2013 2054 Full Code [id number]  Eduard Clos, MD ED   Advance Care Planning Activity        IV Access:    Peripheral IV   Procedures and diagnostic studies:   DG Chest Portable 1 View  Result Date: 04/08/2020 CLINICAL DATA:  Chest pain, nausea, vomiting, dizziness, sore throat, chest discomfort and shortness of breath EXAM: PORTABLE CHEST 1 VIEW COMPARISON:  Radiograph 07/05/2019 FINDINGS: Accounting for body habitus, the lungs are clear. No consolidation, features of edema, pneumothorax, or effusion. Pulmonary vascularity is normally distributed. The cardiomediastinal contours are unremarkable. No acute osseous or soft tissue abnormality. Telemetry leads overlie the chest. IMPRESSION: No acute cardiopulmonary abnormality. Electronically Signed  By: Kreg Shropshire M.D.   On: 04/08/2020 00:17     Medical Consultants:    None.  Anti-Infectives:   none  Subjective:    Augustina Mood she still relates that she is nauseated with one episode of vomiting.  Would like to try diet.  Objective:    Vitals:   04/08/20 0300 04/08/20 0400 04/08/20 0500 04/08/20 0545  BP: 129/86 133/84 115/78 (!) 138/92  Pulse: (!) 126 (!) 122 (!) 118 (!) 117  Resp: (!) 21 (!) 21 (!) 21 19  Temp:      TempSrc:      SpO2: 100% 99% 99%  99%  Weight:      Height:       SpO2: 99 %   Intake/Output Summary (Last 24 hours) at 04/08/2020 8099 Last data filed at 04/08/2020 0549 Gross per 24 hour  Intake 2142.49 ml  Output --  Net 2142.49 ml   Filed Weights   04/07/20 1900  Weight: 90.7 kg    Exam: General exam: In no acute distress. Respiratory system: Good air movement and clear to auscultation. Cardiovascular system: S1 & S2 heard, RRR. No JVD. Gastrointestinal system: Abdomen is nondistended, soft and nontender.  Extremities: No pedal edema. Skin: No rashes, lesions or ulcers Psychiatry: Judgement and insight appear normal. Mood & affect appropriate.   Data Reviewed:    Labs: Basic Metabolic Panel: Recent Labs  Lab 04/07/20 1702 04/07/20 1825 04/07/20 2200 04/08/20 0357  NA 136 136 138 137  K 4.1 4.2 3.3* 3.5  CL 99  --  102 102  CO2 17*  --  26 24  GLUCOSE 346*  --  125* 113*  BUN 12  --  11 10  CREATININE 1.05*  --  0.91 0.82  CALCIUM 9.6  --  9.0 8.9  MG  --   --   --  1.3*   GFR Estimated Creatinine Clearance: 107.2 mL/min (by C-G formula based on SCr of 0.82 mg/dL). Liver Function Tests: Recent Labs  Lab 04/07/20 1702  AST 20  ALT 19  ALKPHOS 99  BILITOT 0.9  PROT 7.8  ALBUMIN 4.0   Recent Labs  Lab 04/07/20 1702  LIPASE 14   No results for input(s): AMMONIA in the last 168 hours. Coagulation profile No results for input(s): INR, PROTIME in the last 168 hours. COVID-19 Labs  No results for input(s): DDIMER, FERRITIN, LDH, CRP in the last 72 hours.  Lab Results  Component Value Date   SARSCOV2NAA NEGATIVE 04/08/2020   SARSCOV2NAA NEGATIVE 06/27/2019   SARSCOV2NAA NEGATIVE 04/03/2019    CBC: Recent Labs  Lab 04/07/20 1702 04/07/20 1825 04/08/20 0357  WBC 8.4  --  9.1  HGB 12.8 13.6 10.5*  HCT 36.9 40.0 30.8*  MCV 90.0  --  93.9  PLT 385  --  294   Cardiac Enzymes: No results for input(s): CKTOTAL, CKMB, CKMBINDEX, TROPONINI in the last 168 hours. BNP (last 3  results) No results for input(s): PROBNP in the last 8760 hours. CBG: Recent Labs  Lab 04/07/20 2118 04/07/20 2259 04/08/20 0058 04/08/20 0221 04/08/20 0340  GLUCAP 231* 125* 176* 185* 136*   D-Dimer: No results for input(s): DDIMER in the last 72 hours. Hgb A1c: Recent Labs    04/07/20 1933  HGBA1C 10.5*   Lipid Profile: No results for input(s): CHOL, HDL, LDLCALC, TRIG, CHOLHDL, LDLDIRECT in the last 72 hours. Thyroid function studies: No results for input(s): TSH, T4TOTAL, T3FREE, THYROIDAB in the last 72  hours.  Invalid input(s): FREET3 Anemia work up: No results for input(s): VITAMINB12, FOLATE, FERRITIN, TIBC, IRON, RETICCTPCT in the last 72 hours. Sepsis Labs: Recent Labs  Lab 04/07/20 1702 04/08/20 0357  WBC 8.4 9.1   Microbiology Recent Results (from the past 240 hour(s))  SARS Coronavirus 2 by RT PCR (hospital order, performed in [facility] hospital lab) Nasopharyngeal Nasopharyngeal Swab     Status: None   Collection Time: 04/08/20  2:30 AM   Specimen: Nasopharyngeal Swab  Result Value Ref Range Status   SARS Coronavirus 2 NEGATIVE NEGATIVE Final    Comment: (NOTE) SARS-CoV-2 target nucleic acids are NOT DETECTED.  The SARS-CoV-2 RNA is generally detectable in upper and lower respiratory specimens during the acute phase of infection. The lowest concentration of SARS-CoV-2 viral copies this assay can detect is 250 copies / mL. A negative result does not preclude SARS-CoV-2 infection and should not be used as the sole basis for treatment or other patient management decisions.  A negative result may occur with improper specimen collection / handling, submission of specimen other than nasopharyngeal swab, presence of viral mutation(s) within the areas targeted by this assay, and inadequate number of viral copies (<250 copies / mL). A negative result must be combined with clinical observations, patient history, and epidemiological information.  Fact  Sheet for Patients:   BoilerBrush.com.cy  Fact Sheet for Healthcare Providers: https://pope.com/  This test is not yet approved or  cleared by the [place] FDA and has been authorized for detection and/or diagnosis of SARS-CoV-2 by FDA under an Emergency Use Authorization (EUA).  This EUA will remain in effect (meaning this test can be used) for the duration of the COVID-19 declaration under Section 564(b)(1) of the Act, 21 U.S.C. section 360bbb-3(b)(1), unless the authorization is terminated or revoked sooner.  Performed at [facility], [address], [place], [place] [address]      Medications:   . enoxaparin (LOVENOX) injection  40 mg Subcutaneous Q24H  . insulin aspart  0-9 Units Subcutaneous Q4H  . insulin glargine  10 Units Subcutaneous Q24H   Continuous Infusions: . famotidine (PEPCID) IV Stopped (04/08/20 0435)  . lactated ringers with kcl 125 mL/hr at 04/08/20 0400      LOS: 0 days   Marinda Elk  [facility]  04/08/2020, 7:12 AM

## 2020-04-08 NOTE — ED Notes (Addendum)
Pharmacy to re-order remaining runs of Potassium. RN to give.

## 2020-04-08 NOTE — ED Notes (Signed)
Lunch Tray Ordered @ 1034. 

## 2020-04-08 NOTE — ED Provider Notes (Signed)
Care assumed at shift change from [facility].  See her note for full HPI and work-up.  Briefly, patient with type 1 diabetes presenting with nausea and vomiting hyperglycemia.  She is noted to have diabetic ketosis with elevated gap, normal pH.  She is treated with Endo tool protocol and IV fluids.  Haldol was given for nausea/vomiting which provided initial improvement.  She remained persistently tachycardic despite IV fluids.  Plan is to give an additional liter of fluids and reevaluate.  If heart rate is improving and anion gap is closed, plan for discharge to home with antiemetic.  UA and chest x-ray are pending to evaluate for any other secondary source of tachycardia.  Physical Exam  BP 133/84   Pulse (!) 122   Temp 99.2 F (37.3 C) (Rectal)   Resp (!) 21   Ht 5\' 3"  (1.6 m)   Wt 90.7 kg   SpO2 99%   BMI 35.42 kg/m   Physical Exam Vitals and nursing note reviewed.  Constitutional:      Appearance: She is well-developed and well-nourished. She is ill-appearing.     Comments: Diaphoretic, vomiting  HENT:     Head: Normocephalic and atraumatic.  Eyes:     Conjunctiva/sclera: Conjunctivae normal.  Cardiovascular:     Rate and Rhythm: Regular rhythm. Tachycardia present.  Pulmonary:     Effort: Pulmonary effort is normal.  Abdominal:     Palpations: Abdomen is soft.  Skin:    General: Skin is warm.  Neurological:     Mental Status: She is alert.  Psychiatric:        Mood and Affect: Mood and affect normal.        Behavior: Behavior normal.     ED Course/Procedures   Clinical Course as of 04/08/20 0443  Mon Apr 07, 2020  1848 Glucose(!): 346 [CG]  1851 Anion gap(!): 20 [CG]  1855 pH, Ven(!): 7.469 [CG]  1856 Bicarbonate: 24.4 [CG]  1856 WBC: 8.4 [CG]  1856 Beta-Hydroxybutyric Acid(!): 3.29 [CG]  1856 CO2(!): 17 [CG]  2022 30 yo female w/ diabetes on insulin (5-10 units TID with meals, 55 units long lasting overnight) presenting to ED with nausea and vomiting for 2-4  days.  Has not taken insulin because she's not eating.  Hx of cholecystectomy. On exam has borderline tachycardia, no focal abdominal ttp on exam.  Labs with anion gap 20, Glucose 346, no acidosis (pH 7.4), bicarb normal, Beta hydrox 3.29.  Plan to start IV insulin, IV fluids, aggressive hydration and nausea medication.  Haldol and morphine given for pain and nausea with some improvement of pain at least.  Will reassess after fluids for PO status.   [MT]  2158 Re-evaluated patient - asleep, easily arousable. Feels better. Asking for ginger ale, tolerating PO [CG]  2315 Re-evaluated patient. Asking for italian ice, no more vomiting, repeat abd exam no tenderness. HR in low 120s  [CG]  Tue Apr 08, 2020  0118 Patient reporting recurrent nausea.  Does not feel better, vomiting on evaluation. She is still tachycardic in the 120s, however the last bolus has not been hung yet.  She is however receiving maintenance fluids at 125/hour.  Ativan ordered for nausea, noting prolonged Qtc. Considering intractable nausea vomiting with persistent tachycardia will likely admit for further management. [JR]    Clinical Course User Index [CG] 0119, PA-C [JR] Mayvis Agudelo, Liberty Handy N, PA-C [MT] [place] Renaye Rakers, MD    Procedures  MDM   Her repeat metabolic  panel shows closed anion gap.  Her tachycardia persists, however chest x-ray and UA are negative for signs of infection.   Patient's nausea and vomiting returned.Ativan ordered for symptom relief considering her QT prolongation.   Patient will be admitted for intractable nausea vomiting, with persistent tachycardia despite greater than 3 L IV fluids and glucose control.      Suhan Paci, [place] N, PA-C 04/08/20 0451    Sabas Sous, MD 04/08/20 [phone]

## 2020-04-09 DIAGNOSIS — R112 Nausea with vomiting, unspecified: Secondary | ICD-10-CM

## 2020-04-09 DIAGNOSIS — R9431 Abnormal electrocardiogram [ECG] [EKG]: Secondary | ICD-10-CM

## 2020-04-09 DIAGNOSIS — E876 Hypokalemia: Secondary | ICD-10-CM

## 2020-04-09 DIAGNOSIS — F418 Other specified anxiety disorders: Secondary | ICD-10-CM

## 2020-04-09 DIAGNOSIS — E1065 Type 1 diabetes mellitus with hyperglycemia: Secondary | ICD-10-CM

## 2020-04-09 DIAGNOSIS — F909 Attention-deficit hyperactivity disorder, unspecified type: Secondary | ICD-10-CM

## 2020-04-09 LAB — GLUCOSE, CAPILLARY
Glucose-Capillary: 100 mg/dL — ABNORMAL HIGH (ref 70–99)
Glucose-Capillary: 117 mg/dL — ABNORMAL HIGH (ref 70–99)
Glucose-Capillary: 120 mg/dL — ABNORMAL HIGH (ref 70–99)
Glucose-Capillary: 129 mg/dL — ABNORMAL HIGH (ref 70–99)
Glucose-Capillary: 147 mg/dL — ABNORMAL HIGH (ref 70–99)
Glucose-Capillary: 186 mg/dL — ABNORMAL HIGH (ref 70–99)
Glucose-Capillary: 289 mg/dL — ABNORMAL HIGH (ref 70–99)
Glucose-Capillary: 292 mg/dL — ABNORMAL HIGH (ref 70–99)

## 2020-04-09 LAB — BASIC METABOLIC PANEL
Anion gap: 7 (ref 5–15)
BUN: <5 mg/dL — ABNORMAL LOW (ref 6–20)
CO2: 23 mmol/L (ref 22–32)
Calcium: 8 mg/dL — ABNORMAL LOW (ref 8.9–10.3)
Chloride: 106 mmol/L (ref 98–111)
Creatinine, Ser: 0.74 mg/dL (ref 0.44–1.00)
GFR, Estimated: >60 mL/min (ref >60)
Glucose, Bld: 181 mg/dL — ABNORMAL HIGH (ref 70–99)
Potassium: 4.2 mmol/L (ref 3.5–5.1)
Sodium: 136 mmol/L (ref 135–145)

## 2020-04-09 LAB — MAGNESIUM: Magnesium: 2.1 mg/dL (ref 1.7–2.4)

## 2020-04-09 NOTE — Progress Notes (Signed)
Pt had chicken and mashed potatoes for dinner and tolerated well.

## 2020-04-09 NOTE — Progress Notes (Signed)
PROGRESS NOTE    Theresa [name]  NOM:[id number] DOB: 07/17/1990 DOA: 04/07/2020 PCP: Cameran Paris, NP   Brief Narrative:  HPI On 04/08/20 by Dr. Marcial Pacas Opyd Caya [name] is a 30 y.o. female with medical history significant for depression, anxiety, ADHD, and insulin-dependent diabetes mellitus, now presenting to the emergency department for evaluation of nausea, vomiting, lightheadedness, and chest discomfort.  Patient reports that symptoms began a few days ago, she was not taking her insulin due to not eating or drinking much, and has had progressive worsening.  In addition to the nausea and nonbloody vomiting, she has developed a burning discomfort in her central chest and lightheadedness.  She denies any fevers, chills, dysuria, or flank pain.  No lower abdominal or suprapubic pain.  Interim history Admitted with intractable nausea and vomiting and prolonged QTc.  Found to have mild DKA, which resolved with IV fluids and insulin. Assessment & Plan   Intractable nausea vomiting -Has been progressive over the past several days -Likely made worse by mild DKA -Patient was treated with IV fluids -Continue antiemetics as needed -Was placed on a clear liquid diet, will transition to full liquids as tolerated  Diabetes mellitus, type II -Hemoglobin A1c 10.5 -Presented with nausea and vomiting and is unable to eat or take insulin -Improving with IV fluids and insulin -Continue lantus, insulin sliding scale, and CBG monitoring -Diabetes coordinator consulted and appreciated  Prolonged QT interval  -Initially QTC was 541 -QTC now down to 444 -Minimize QT prolonging medications  Hypokalemia/hypokalemia -In the setting of nausea and vomiting -Resolved with replacement, continue to monitor and replace as needed  Depression/anxiety/ADHD -Medications held as patient has been somewhat sleepy today  Morbid obesity -BMI 35.42 -Patient wanting to follow-up with her PCP for lifestyle  modifications  DVT Prophylaxis Lovenox  Code Status: Full  Family Communication: None at bedside  Disposition Plan:  Status is: Inpatient  Remains inpatient appropriate because:IV treatments appropriate due to intensity of illness or inability to take PO   Dispo: The patient is from: Home              Anticipated d/c is to: Home              Anticipated d/c date is: 1 day              Patient currently is not medically stable to d/c.   Difficult to place patient No   Consultants None  Procedures  None  Antibiotics   Anti-infectives (From admission, onward)   None      Subjective:   Theresa [name] seen and examined today.  Patient's feels that she is very sleepy this morning and tired.  She denies any nausea or vomiting at this time.  States that she had several episodes of vomiting prior to admission.  Complains of some midsternal chest pain.  Denies current shortness of breath, abdominal pain, dizziness or headache.    Objective:   Vitals:   04/08/20 1445 04/08/20 2207 04/09/20 0512 04/09/20 1509  BP: (!) 131/56 118/77 122/74 119/83  Pulse: (!) 108 (!) 108 (!) 105 (!) 19  Resp: 18 20 19 19   Temp: 98.3 F (36.8 C) 98.5 F (36.9 C) 98.2 F (36.8 C) 98.4 F (36.9 C)  TempSrc: Oral Oral  Oral  SpO2: 93% 99% 99%   Weight:      Height:        Intake/Output Summary (Last 24 hours) at 04/09/2020 1528 Last data filed at 04/09/2020  1500 Gross per 24 hour  Intake 240 ml  Output --  Net 240 ml   Filed Weights   04/07/20 1900  Weight: 90.7 kg    Exam  General: Well developed, well nourished, NAD, appears stated age  HEENT: NCAT, mucous membranes moist.   Cardiovascular: S1 S2 auscultated, tachycardic, no murmurs appreciated  Respiratory: Clear to auscultation bilaterally with equal chest rise  Abdomen: Soft, obese, nontender, nondistended, + bowel sounds  Extremities: warm dry without cyanosis clubbing or edema  Neuro: AAOx3, nonfocal  Psych:  flat   Data Reviewed: I have personally reviewed following labs and imaging studies  CBC: Recent Labs  Lab 04/07/20 1702 04/07/20 1825 04/08/20 0357  WBC 8.4  --  9.1  HGB 12.8 13.6 10.5*  HCT 36.9 40.0 30.8*  MCV 90.0  --  93.9  PLT 385  --  294   Basic Metabolic Panel: Recent Labs  Lab 04/07/20 1702 04/07/20 1825 04/07/20 2200 04/08/20 0357 04/09/20 0132  NA 136 136 138 137 136  K 4.1 4.2 3.3* 3.5 4.2  CL 99  --  102 102 106  CO2 17*  --  26 24 23   GLUCOSE 346*  --  125* 113* 181*  BUN 12  --  11 10 <5*  CREATININE 1.05*  --  0.91 0.82 0.74  CALCIUM 9.6  --  9.0 8.9 8.0*  MG  --   --   --  1.3* 2.1   GFR: Estimated Creatinine Clearance: 109.9 mL/min (by C-G formula based on SCr of 0.74 mg/dL). Liver Function Tests: Recent Labs  Lab 04/07/20 1702  AST 20  ALT 19  ALKPHOS 99  BILITOT 0.9  PROT 7.8  ALBUMIN 4.0   Recent Labs  Lab 04/07/20 1702  LIPASE 14   No results for input(s): AMMONIA in the last 168 hours. Coagulation Profile: No results for input(s): INR, PROTIME in the last 168 hours. Cardiac Enzymes: No results for input(s): CKTOTAL, CKMB, CKMBINDEX, TROPONINI in the last 168 hours. BNP (last 3 results) No results for input(s): PROBNP in the last 8760 hours. HbA1C: Recent Labs    04/07/20 1933  HGBA1C 10.5*   CBG: Recent Labs  Lab 04/08/20 2000 04/08/20 2359 04/09/20 0409 04/09/20 0815 04/09/20 1153  GLUCAP 169* 147* 117* 120* 129*   Lipid Profile: No results for input(s): CHOL, HDL, LDLCALC, TRIG, CHOLHDL, LDLDIRECT in the last 72 hours. Thyroid Function Tests: No results for input(s): TSH, T4TOTAL, FREET4, T3FREE, THYROIDAB in the last 72 hours. Anemia Panel: No results for input(s): VITAMINB12, FOLATE, FERRITIN, TIBC, IRON, RETICCTPCT in the last 72 hours. Urine analysis:    Component Value Date/Time   COLORURINE YELLOW 04/08/2020 0106   APPEARANCEUR HAZY (A) 04/08/2020 0106   LABSPEC 1.022 04/08/2020 0106   PHURINE 7.0  04/08/2020 0106   GLUCOSEU >=500 (A) 04/08/2020 0106   HGBUR NEGATIVE 04/08/2020 0106   BILIRUBINUR NEGATIVE 04/08/2020 0106   BILIRUBINUR neg 07/19/2013 1519   KETONESUR 80 (A) 04/08/2020 0106   PROTEINUR NEGATIVE 04/08/2020 0106   UROBILINOGEN 0.2 02/25/2014 0524   NITRITE NEGATIVE 04/08/2020 0106   LEUKOCYTESUR NEGATIVE 04/08/2020 0106   Sepsis Labs: @LABRCNTIP (procalcitonin:4,lacticidven:4)  ) Recent Results (from the past 240 hour(s))  SARS Coronavirus 2 by RT PCR (hospital order, performed in [facility] hospital lab) Nasopharyngeal Nasopharyngeal Swab     Status: None   Collection Time: 04/08/20  2:30 AM   Specimen: Nasopharyngeal Swab  Result Value Ref Range Status   SARS Coronavirus 2 NEGATIVE  NEGATIVE Final    Comment: (NOTE) SARS-CoV-2 target nucleic acids are NOT DETECTED.  The SARS-CoV-2 RNA is generally detectable in upper and lower respiratory specimens during the acute phase of infection. The lowest concentration of SARS-CoV-2 viral copies this assay can detect is 250 copies / mL. A negative result does not preclude SARS-CoV-2 infection and should not be used as the sole basis for treatment or other patient management decisions.  A negative result may occur with improper specimen collection / handling, submission of specimen other than nasopharyngeal swab, presence of viral mutation(s) within the areas targeted by this assay, and inadequate number of viral copies (<250 copies / mL). A negative result must be combined with clinical observations, patient history, and epidemiological information.  Fact Sheet for Patients:   BoilerBrush.com.cy  Fact Sheet for Healthcare Providers: https://pope.com/  This test is not yet approved or  cleared by the [place] FDA and has been authorized for detection and/or diagnosis of SARS-CoV-2 by FDA under an Emergency Use Authorization (EUA).  This EUA will remain in effect  (meaning this test can be used) for the duration of the COVID-19 declaration under Section 564(b)(1) of the Act, 21 U.S.C. section 360bbb-3(b)(1), unless the authorization is terminated or revoked sooner.  Performed at [facility], [address], [place], [place] [address]       Radiology Studies: DG Chest Portable 1 View  Result Date: 04/08/2020 CLINICAL DATA:  Chest pain, nausea, vomiting, dizziness, sore throat, chest discomfort and shortness of breath EXAM: PORTABLE CHEST 1 VIEW COMPARISON:  Radiograph 07/05/2019 FINDINGS: Accounting for body habitus, the lungs are clear. No consolidation, features of edema, pneumothorax, or effusion. Pulmonary vascularity is normally distributed. The cardiomediastinal contours are unremarkable. No acute osseous or soft tissue abnormality. Telemetry leads overlie the chest. IMPRESSION: No acute cardiopulmonary abnormality. Electronically Signed   By: Kreg Shropshire M.D.   On: 04/08/2020 00:17     Scheduled Meds: . enoxaparin (LOVENOX) injection  40 mg Subcutaneous Q24H  . insulin aspart  0-9 Units Subcutaneous Q4H  . insulin glargine  10 Units Subcutaneous Q24H   Continuous Infusions: . famotidine (PEPCID) IV 20 mg (04/09/20 1132)     LOS: 1 day   Time Spent in minutes   45 minutes  Nessa Ramaker D.O. on 04/09/2020 at 3:28 PM  Between 7am to 7pm - Please see pager noted on [email or web address]  After 7pm go to [email or web address]  And look for the night coverage person covering for me after hours  [facility] Office  [phone]

## 2020-04-10 LAB — BASIC METABOLIC PANEL
Anion gap: 8 (ref 5–15)
BUN: <5 mg/dL — ABNORMAL LOW (ref 6–20)
CO2: 24 mmol/L (ref 22–32)
Calcium: 8.6 mg/dL — ABNORMAL LOW (ref 8.9–10.3)
Chloride: 104 mmol/L (ref 98–111)
Creatinine, Ser: 0.8 mg/dL (ref 0.44–1.00)
GFR, Estimated: >60 mL/min (ref >60)
Glucose, Bld: 313 mg/dL — ABNORMAL HIGH (ref 70–99)
Potassium: 4 mmol/L (ref 3.5–5.1)
Sodium: 136 mmol/L (ref 135–145)

## 2020-04-10 LAB — GLUCOSE, CAPILLARY
Glucose-Capillary: 168 mg/dL — ABNORMAL HIGH (ref 70–99)
Glucose-Capillary: 105 mg/dL — ABNORMAL HIGH (ref 70–99)
Glucose-Capillary: 96 mg/dL (ref 70–99)

## 2020-04-10 NOTE — Progress Notes (Signed)
Discharge instructions (including medications) discussed with and copy provided to patient/caregiver 

## 2020-04-10 NOTE — Discharge Summary (Signed)
Physician Discharge Summary  Theresa [name] ZOX:[id number] DOB: August 20, 1990 DOA: 04/07/2020  PCP: Simona Huh, NP  Admit date: 04/07/2020 Discharge date: 04/10/2020  Time spent: 45 minutes  Recommendations for Outpatient Follow-up:  Patient will be discharged to home.  Patient will need to follow up with primary care provider within one week of discharge.  Follow up with endocrinology and gastroenterology. Patient should continue medications as prescribed.  Patient should follow a carb modified diet.   Discharge Diagnoses:  Intractable nausea vomiting Diabetes mellitus, type I with hyperglycemia and neuropathy Prolonged QT interval  Hypokalemia/hypokalemia Depression/anxiety/ADHD Morbid obesity  Discharge Condition: Stable  Diet recommendation: carb modified  Filed Weights   04/07/20 1900  Weight: 90.7 kg    History of present illness:  On 04/08/20 by Dr. Christia Reading [name] Theresa [name] a 30 y.o.femalewith medical history significant fordepression, anxiety, ADHD, and insulin-dependent diabetes mellitus, now presenting to the emergency department for evaluation of nausea, vomiting, lightheadedness, and chest discomfort. Patient reports that symptoms began a few days ago, she was not taking her insulin due to not eating or drinking much, and has had progressive worsening. In addition to the nausea and nonbloody vomiting, she has developed a burning discomfort in her central chest and lightheadedness. She denies any fevers, chills, dysuria, or flank pain. No lower abdominal or suprapubic pain.  Hospital Course:  Intractable nausea vomiting -Has been progressive over the past several days -Likely made worse by mild DKA -Patient was treated with IV fluids -Continue antiemetics as needed -Advanced to soft diet and tolerated well -still complained of mild nausea however no vomiting -?Gastroparesis -Patient has seen [facility] gastroenterology in the past and  will follow up with them as an outpatient  Diabetes mellitus, type I with hyperglycemia and neuropathy -Hemoglobin A1c 10.5 -Presented with nausea and vomiting and is unable to eat or take insulin -Improving with IV fluids and insulin -Diabetes coordinator consulted and appreciated -Patient states she has tried several medications for neuropathy in the past and has never been allergic to them or has not found any relief with them.  She takes alpha lipoic acid and oxycodone. -Continue home regimen on discharge and follow up with endocrinology, Dr. Buddy Duty  Prolonged QT interval  -Initially QTC was 541 -QTC now down to 444 -Minimize QT prolonging medications  Hypokalemia/hypomagnesemia -In the setting of nausea and vomiting -Resolved with replacement, continue to monitor and replace as needed  Depression/anxiety/ADHD -Medications held as patient has been somewhat sleepy today  Morbid obesity -BMI 35.42 -Patient wanting to follow-up with her PCP for lifestyle modifications  Procedures: None  Consultations: None  Discharge Exam: Vitals:   04/09/20 2103 04/10/20 0436  BP: 140/87 137/79  Pulse: 99 92  Resp: 17 16  Temp: 98.8 F (37.1 C) 97.9 F (36.6 C)  SpO2: 100% 100%     General: Well developed, well nourished, NAD, appears stated age  HEENT: NCAT, mucous membranes moist.  Cardiovascular: S1 S2 auscultated, RRR, no murmur.  Respiratory: Clear to auscultation bilaterally with equal chest rise  Abdomen: Soft, obese, nontender, nondistended, + bowel sounds  Extremities: warm dry without cyanosis clubbing or edema  Neuro: AAOx3, nonfocal  Psych: Appropriate mood and affect, pleasant   Discharge Instructions Discharge Instructions    Diet Carb Modified   Complete by: As directed    Discharge instructions   Complete by: As directed    Patient will be discharged to home.  Patient will need to follow up with primary care provider within  one week of discharge.   Follow up with endocrinology and gastroenterology. Patient should continue medications as prescribed.  Patient should follow a carb modified diet.     Allergies as of 04/10/2020      Reactions   Tapentadol Swelling   Aloe Hives   Duloxetine Other (See Comments)   RESTLESSNESS, URGES TO MOVE   Gabapentin Swelling   SWELLING REACTION OF FEET   Olive Oil Hives   Pregabalin Other (See Comments)   Heart palpitations, Fainting, chest pain, sweats   Zonisamide Other (See Comments)   WORSENING PAIN      Medication List    STOP taking these medications   ASHWAGANDHA PO   dicyclomine 20 MG tablet Commonly known as: BENTYL   Hyoscyamine Sulfate SL 0.125 MG Subl Commonly known as: Levsin/SL   prochlorperazine 10 MG tablet Commonly known as: COMPAZINE   promethazine 25 MG suppository Commonly known as: PHENERGAN     TAKE these medications   alum & mag hydroxide-simeth 829-937-16 MG/5ML suspension Commonly known as: Maalox Advanced Max St Take 15 mLs by mouth every 6 (six) hours as needed for indigestion.   amphetamine-dextroamphetamine 20 MG tablet Commonly known as: Adderall Take one and 1/2 tablets daily. What changed:   how much to take  how to take this  when to take this  reasons to take this  additional instructions   B-COMPLEX-C PO Take 1 capsule by mouth daily.   buPROPion 300 MG 24 hr tablet Commonly known as: WELLBUTRIN XL Take one tablet daily What changed:   how much to take  how to take this  when to take this  additional instructions   cholecalciferol 25 MCG (1000 UNIT) tablet Commonly known as: VITAMIN D3 Take 1,000 Units by mouth daily.   etonogestrel 68 MG Impl implant Commonly known as: NEXPLANON 1 each by Subdermal route continuous.   ferrous sulfate 324 (65 Fe) MG Tbec Take 324 mg by mouth daily.   hydrOXYzine 10 MG tablet Commonly known as: ATARAX/VISTARIL Take 10 mg by mouth every 8 (eight) hours as needed for itching.    insulin aspart 100 UNIT/ML FlexPen Commonly known as: NOVOLOG Inject 5-20 Units into the skin See admin instructions. Sliding scale: If blood sugar over 150, takes 5 units. Maximum 20 units.   insulin degludec 100 UNIT/ML FlexTouch Pen Commonly known as: TRESIBA Inject 50 Units into the skin daily.   modafinil 200 MG tablet Commonly known as: PROVIGIL Take 300 mg by mouth daily.   Narcan 4 MG/0.1ML Liqd nasal spray kit Generic drug: naloxone Place 1 spray into the nose once. Overdose   ondansetron 4 MG disintegrating tablet Commonly known as: ZOFRAN-ODT Take 4 mg by mouth every 8 (eight) hours as needed for nausea or vomiting.   oxyCODONE-acetaminophen 7.5-325 MG tablet Commonly known as: PERCOCET Take 1 tablet by mouth 3 (three) times daily as needed for pain.   Turmeric 500 MG Caps Take 500 mg by mouth daily.   venlafaxine XR 37.5 MG 24 hr capsule Commonly known as: EFFEXOR-XR Take one capsule daily. What changed:   how much to take  how to take this  when to take this  additional instructions      Allergies  Allergen Reactions  . Tapentadol Swelling  . Aloe Hives  . Duloxetine Other (See Comments)    RESTLESSNESS, URGES TO MOVE  . Gabapentin Swelling    SWELLING REACTION OF FEET   . Olive Oil Hives  . Pregabalin Other (See Comments)  Heart palpitations, Fainting, chest pain, sweats  . Zonisamide Other (See Comments)    WORSENING PAIN    Follow-up Information    Simona Huh, NP. Schedule an appointment as soon as possible for a visit in 1 week(s).   Specialty: Nurse Practitioner Why: Hospital follow up Contact information: [place] [address] [phone]        Delrae Rend, MD. Schedule an appointment as soon as possible for a visit in 1 week(s).   Specialty: Endocrinology Why: Hospital follow up Contact information: [address] [place] [address] [phone]                The  results of significant diagnostics from this hospitalization (including imaging, microbiology, ancillary and laboratory) are listed below for reference.    Significant Diagnostic Studies: DG Chest Portable 1 View  Result Date: 04/08/2020 CLINICAL DATA:  Chest pain, nausea, vomiting, dizziness, sore throat, chest discomfort and shortness of breath EXAM: PORTABLE CHEST 1 VIEW COMPARISON:  Radiograph 07/05/2019 FINDINGS: Accounting for body habitus, the lungs are clear. No consolidation, features of edema, pneumothorax, or effusion. Pulmonary vascularity is normally distributed. The cardiomediastinal contours are unremarkable. No acute osseous or soft tissue abnormality. Telemetry leads overlie the chest. IMPRESSION: No acute cardiopulmonary abnormality. Electronically Signed   By: Lovena Le M.D.   On: 04/08/2020 00:17    Microbiology: Recent Results (from the past 240 hour(s))  SARS Coronavirus 2 by RT PCR (hospital order, performed in [facility] hospital lab) Nasopharyngeal Nasopharyngeal Swab     Status: None   Collection Time: 04/08/20  2:30 AM   Specimen: Nasopharyngeal Swab  Result Value Ref Range Status   SARS Coronavirus 2 NEGATIVE NEGATIVE Final    Comment: (NOTE) SARS-CoV-2 target nucleic acids are NOT DETECTED.  The SARS-CoV-2 RNA is generally detectable in upper and lower respiratory specimens during the acute phase of infection. The lowest concentration of SARS-CoV-2 viral copies this assay can detect is 250 copies / mL. A negative result does not preclude SARS-CoV-2 infection and should not be used as the sole basis for treatment or other patient management decisions.  A negative result may occur with improper specimen collection / handling, submission of specimen other than nasopharyngeal swab, presence of viral mutation(s) within the areas targeted by this assay, and inadequate number of viral copies (<250 copies / mL). A negative result must be combined with  clinical observations, patient history, and epidemiological information.  Fact Sheet for Patients:   StrictlyIdeas.no  Fact Sheet for Healthcare Providers: BankingDealers.co.za  This test is not yet approved or  cleared by the [place] FDA and has been authorized for detection and/or diagnosis of SARS-CoV-2 by FDA under an Emergency Use Authorization (EUA).  This EUA will remain in effect (meaning this test can be used) for the duration of the COVID-19 declaration under Section 564(b)(1) of the Act, 21 U.S.C. section 360bbb-3(b)(1), unless the authorization is terminated or revoked sooner.  Performed at [facility], [place] [address], [place], [place] [address]      Labs: Basic Metabolic Panel: Recent Labs  Lab 04/07/20 1702 04/07/20 1825 04/07/20 2200 04/08/20 0357 04/09/20 0132 04/10/20 0111  NA 136 136 138 137 136 136  K 4.1 4.2 3.3* 3.5 4.2 4.0  CL 99  --  102 102 106 104  CO2 17*  --  '26 24 23 24  ' GLUCOSE 346*  --  125* 113* 181* 313*  BUN 12  --  11 10 <5* <5*  CREATININE 1.05*  --  0.91 0.82 0.74 0.80  CALCIUM 9.6  --  9.0 8.9 8.0* 8.6*  MG  --   --   --  1.3* 2.1  --    Liver Function Tests: Recent Labs  Lab 04/07/20 1702  AST 20  ALT 19  ALKPHOS 99  BILITOT 0.9  PROT 7.8  ALBUMIN 4.0   Recent Labs  Lab 04/07/20 1702  LIPASE 14   No results for input(s): AMMONIA in the last 168 hours. CBC: Recent Labs  Lab 04/07/20 1702 04/07/20 1825 04/08/20 0357  WBC 8.4  --  9.1  HGB 12.8 13.6 10.5*  HCT 36.9 40.0 30.8*  MCV 90.0  --  93.9  PLT 385  --  294   Cardiac Enzymes: No results for input(s): CKTOTAL, CKMB, CKMBINDEX, TROPONINI in the last 168 hours. BNP: BNP (last 3 results) No results for input(s): BNP in the last 8760 hours.  ProBNP (last 3 results) No results for input(s): PROBNP in the last 8760 hours.  CBG: Recent Labs  Lab 04/09/20 1954 04/09/20 2100 04/09/20 2345  04/10/20 0436 04/10/20 0753  GLUCAP 186* 289* 292* 168* 105*       Signed:  Marcelle Hepner  [facility] 04/10/2020, 9:47 AM

## 2020-04-10 NOTE — Plan of Care (Signed)
  Problem: Education: Goal: Knowledge of General Education information will improve Description: Including pain rating scale, medication(s)/side effects and non-pharmacologic comfort measures Outcome: Adequate for Discharge   

## 2020-04-10 NOTE — Discharge Instructions (Signed)
Nausea, Adult °Nausea is feeling sick to your stomach or feeling that you are about to throw up (vomit). Feeling sick to your stomach is usually not serious, but it may be an early sign of a more serious medical problem. °As you feel sicker to your stomach, you may throw up. If you throw up, or if you are not able to drink enough fluids, there is a risk that you may lose too much water in your body (get dehydrated). If you lose too much water in your body, you may: °· Feel tired. °· Feel thirsty. °· Have a dry mouth. °· Have cracked lips. °· Go pee (urinate) less often. °Older adults and people who have other diseases or a weak body defense system (immune system) have a higher risk of losing too much water in the body. °The main goals of treating this condition are: °· To relieve your nausea. °· To ensure your nausea occurs less often. °· To prevent throwing up and losing too much fluid. °Follow these instructions at home: °Watch your symptoms for any changes. Tell your doctor about them. Follow these instructions as told by your doctor. °Eating and drinking °· Take an ORS (oral rehydration solution). This is a drink that is sold at pharmacies and stores. °· Drink clear fluids in small amounts as you are able. These include: °? Water. °? Ice chips. °? Fruit juice that has water added (diluted fruit juice). °? Low-calorie sports drinks. °· Eat bland, easy-to-digest foods in small amounts as you are able, such as: °? Bananas. °? Applesauce. °? Rice. °? Low-fat (lean) meats. °? Toast. °? Crackers. °· Avoid drinking fluids that have a lot of sugar or caffeine in them. This includes energy drinks, sports drinks, and soda. °· Avoid alcohol. °· Avoid spicy or fatty foods.  °  °  °General instructions °· Take over-the-counter and prescription medicines only as told by your doctor. °· Rest at home while you get better. °· Drink enough fluid to keep your pee (urine) pale yellow. °· Take slow and deep breaths when you feel  sick to your stomach. °· Avoid food or things that have strong smells. °· Wash your hands often with soap and water. If you cannot use soap and water, use hand sanitizer. °· Make sure that all people in your home wash their hands well and often. °· Keep all follow-up visits as told by your doctor. This is important. °Contact a doctor if: °· You feel sicker to your stomach. °· You feel sick to your stomach for more than 2 days. °· You throw up. °· You are not able to drink fluids without throwing up. °· You have new symptoms. °· You have a fever. °· You have a headache. °· You have muscle cramps. °· You have a rash. °· You have pain while peeing. °· You feel light-headed or dizzy. °Get help right away if: °· You have pain in your chest, neck, arm, or jaw. °· You feel very weak or you pass out (faint). °· You have throw up that is bright red or looks like coffee grounds. °· You have bloody or black poop (stools) or poop that looks like tar. °· You have a very bad headache, a stiff neck, or both. °· You have very bad pain, cramping, or bloating in your belly (abdomen). °· You have trouble breathing or you are breathing very quickly. °· Your heart is beating very quickly. °· Your skin feels cold and clammy. °· You feel confused. °·   You have signs of losing too much water in your body, such as: °? Dark pee, very little pee, or no pee. °? Cracked lips. °? Dry mouth. °? Sunken eyes. °? Sleepiness. °? Weakness. °These symptoms may be an emergency. Do not wait to see if the symptoms will go away. Get medical help right away. Call your local emergency services (911 in the [place] Do not drive yourself to the hospital. °Summary °· Nausea is feeling sick to your stomach or feeling that you are about to throw up (vomit). °· If you throw up, or if you are not able to drink enough fluids, there is a risk that you may lose too much water in your body (get dehydrated). °· Eat and drink what your doctor tells you. Take  over-the-counter and prescription medicines only as told by your doctor. °· Contact a doctor right away if your symptoms get worse or you have new symptoms. °· Keep all follow-up visits as told by your doctor. This is important. °This information is not intended to replace advice given to you by your health care provider. Make sure you discuss any questions you have with your health care provider. °Document Revised: 01/23/2019 Document Reviewed: 08/02/2017 °Elsevier Patient Education © 2021 [employer] ° °

## 2020-04-14 ENCOUNTER — Ambulatory Visit: Payer: BC Managed Care – PPO | Admitting: Adult Health

## 2020-04-25 ENCOUNTER — Encounter (INDEPENDENT_AMBULATORY_CARE_PROVIDER_SITE_OTHER): Payer: BC Managed Care – PPO | Admitting: Ophthalmology

## 2020-05-02 ENCOUNTER — Encounter (INDEPENDENT_AMBULATORY_CARE_PROVIDER_SITE_OTHER): Payer: BC Managed Care – PPO | Admitting: Ophthalmology

## 2020-05-08 ENCOUNTER — Ambulatory Visit: Payer: BC Managed Care – PPO | Admitting: Adult Health

## 2020-05-09 ENCOUNTER — Telehealth: Payer: Self-pay | Admitting: Adult Health

## 2020-05-09 NOTE — Telephone Encounter (Signed)
Pt called and said she needs a refill on her adderall 20 mg  To the [facility] on [address]. Next appt 3/15

## 2020-05-11 ENCOUNTER — Observation Stay (HOSPITAL_COMMUNITY): Payer: BC Managed Care – PPO

## 2020-05-11 ENCOUNTER — Observation Stay (HOSPITAL_COMMUNITY)
Admission: EM | Admit: 2020-05-11 | Discharge: 2020-05-12 | Disposition: A | Discharge status: Home / Self Care | Payer: BC Managed Care – PPO | Attending: Family Medicine | Admitting: Family Medicine

## 2020-05-11 ENCOUNTER — Encounter (HOSPITAL_COMMUNITY): Payer: Self-pay | Admitting: Emergency Medicine

## 2020-05-11 ENCOUNTER — Other Ambulatory Visit: Payer: Self-pay

## 2020-05-11 DIAGNOSIS — E1065 Type 1 diabetes mellitus with hyperglycemia: Secondary | ICD-10-CM | POA: Diagnosis present

## 2020-05-11 DIAGNOSIS — Z79899 Other long term (current) drug therapy: Secondary | ICD-10-CM | POA: Insufficient documentation

## 2020-05-11 DIAGNOSIS — F418 Other specified anxiety disorders: Secondary | ICD-10-CM | POA: Diagnosis present

## 2020-05-11 DIAGNOSIS — Z794 Long term (current) use of insulin: Secondary | ICD-10-CM | POA: Insufficient documentation

## 2020-05-11 DIAGNOSIS — E1010 Type 1 diabetes mellitus with ketoacidosis without coma: Secondary | ICD-10-CM | POA: Diagnosis not present

## 2020-05-11 DIAGNOSIS — R112 Nausea with vomiting, unspecified: Secondary | ICD-10-CM

## 2020-05-11 DIAGNOSIS — Z20822 Contact with and (suspected) exposure to covid-19: Secondary | ICD-10-CM | POA: Insufficient documentation

## 2020-05-11 DIAGNOSIS — R109 Unspecified abdominal pain: Secondary | ICD-10-CM | POA: Diagnosis not present

## 2020-05-11 DIAGNOSIS — R1111 Vomiting without nausea: Secondary | ICD-10-CM | POA: Diagnosis present

## 2020-05-11 DIAGNOSIS — R1110 Vomiting, unspecified: Secondary | ICD-10-CM

## 2020-05-11 DIAGNOSIS — E1110 Type 2 diabetes mellitus with ketoacidosis without coma: Secondary | ICD-10-CM | POA: Diagnosis present

## 2020-05-11 HISTORY — DX: Gastro-esophageal reflux disease without esophagitis: K21.9

## 2020-05-11 LAB — CBC
HCT: 36.9 % (ref 36.0–46.0)
Hemoglobin: 11.8 g/dL — ABNORMAL LOW (ref 12.0–15.0)
MCH: 30.6 pg (ref 26.0–34.0)
MCHC: 32 g/dL (ref 30.0–36.0)
MCV: 95.6 fL (ref 80.0–100.0)
Platelets: 457 10*3/uL — ABNORMAL HIGH (ref 150–400)
RBC: 3.86 MIL/uL — ABNORMAL LOW (ref 3.87–5.11)
RDW: 12.9 % (ref 11.5–15.5)
WBC: 9 10*3/uL (ref 4.0–10.5)
nRBC: 0 % (ref 0.0–0.2)

## 2020-05-11 LAB — BASIC METABOLIC PANEL
Anion gap: 15 (ref 5–15)
BUN: 14 mg/dL (ref 6–20)
CO2: 18 mmol/L — ABNORMAL LOW (ref 22–32)
Calcium: 8.8 mg/dL — ABNORMAL LOW (ref 8.9–10.3)
Chloride: 103 mmol/L (ref 98–111)
Creatinine, Ser: 0.92 mg/dL (ref 0.44–1.00)
GFR, Estimated: >60 mL/min (ref >60)
Glucose, Bld: 156 mg/dL — ABNORMAL HIGH (ref 70–99)
Potassium: 3.8 mmol/L (ref 3.5–5.1)
Sodium: 136 mmol/L (ref 135–145)

## 2020-05-11 LAB — URINALYSIS, ROUTINE W REFLEX MICROSCOPIC
Bilirubin Urine: NEGATIVE
Glucose, UA: >=500 mg/dL — AB
Hgb urine dipstick: NEGATIVE
Ketones, ur: 80 mg/dL — AB
Leukocytes,Ua: NEGATIVE
Nitrite: NEGATIVE
Protein, ur: NEGATIVE mg/dL
Specific Gravity, Urine: 1.022 (ref 1.005–1.030)
pH: 5 (ref 5.0–8.0)

## 2020-05-11 LAB — RESP PANEL BY RT-PCR (FLU A&B, COVID) ARPGX2
Influenza A by PCR: NEGATIVE
Influenza B by PCR: NEGATIVE
SARS Coronavirus 2 by RT PCR: NEGATIVE

## 2020-05-11 LAB — RAPID URINE DRUG SCREEN, HOSP PERFORMED
Amphetamines: POSITIVE — AB
Barbiturates: NOT DETECTED
Benzodiazepines: NOT DETECTED
Cocaine: NOT DETECTED
Opiates: NOT DETECTED
Tetrahydrocannabinol: NOT DETECTED

## 2020-05-11 LAB — BETA-HYDROXYBUTYRIC ACID
Beta-Hydroxybutyric Acid: 4.15 mmol/L — ABNORMAL HIGH (ref 0.05–0.27)
Beta-Hydroxybutyric Acid: 1.93 mmol/L — ABNORMAL HIGH (ref 0.05–0.27)

## 2020-05-11 LAB — I-STAT VENOUS BLOOD GAS, ED
Acid-base deficit: 2 mmol/L (ref 0.0–2.0)
Bicarbonate: 20.4 mmol/L (ref 20.0–28.0)
Calcium, Ion: 1.11 mmol/L — ABNORMAL LOW (ref 1.15–1.40)
HCT: 37 % (ref 36.0–46.0)
Hemoglobin: 12.6 g/dL (ref 12.0–15.0)
O2 Saturation: 76 %
Potassium: 4.1 mmol/L (ref 3.5–5.1)
Sodium: 137 mmol/L (ref 135–145)
TCO2: 21 mmol/L — ABNORMAL LOW (ref 22–32)
pCO2, Ven: 28.3 mmHg — ABNORMAL LOW (ref 44.0–60.0)
pH, Ven: 7.467 — ABNORMAL HIGH (ref 7.250–7.430)
pO2, Ven: 38 mmHg (ref 32.0–45.0)

## 2020-05-11 LAB — COMPREHENSIVE METABOLIC PANEL
ALT: 13 U/L (ref 0–44)
AST: 14 U/L — ABNORMAL LOW (ref 15–41)
Albumin: 3.9 g/dL (ref 3.5–5.0)
Alkaline Phosphatase: 87 U/L (ref 38–126)
Anion gap: 17 — ABNORMAL HIGH (ref 5–15)
BUN: 17 mg/dL (ref 6–20)
CO2: 19 mmol/L — ABNORMAL LOW (ref 22–32)
Calcium: 9.7 mg/dL (ref 8.9–10.3)
Chloride: 99 mmol/L (ref 98–111)
Creatinine, Ser: 1.15 mg/dL — ABNORMAL HIGH (ref 0.44–1.00)
GFR, Estimated: >60 mL/min (ref >60)
Glucose, Bld: 332 mg/dL — ABNORMAL HIGH (ref 70–99)
Potassium: 4.4 mmol/L (ref 3.5–5.1)
Sodium: 135 mmol/L (ref 135–145)
Total Bilirubin: 1.3 mg/dL — ABNORMAL HIGH (ref 0.3–1.2)
Total Protein: 7.6 g/dL (ref 6.5–8.1)

## 2020-05-11 LAB — CBG MONITORING, ED
Glucose-Capillary: 324 mg/dL — ABNORMAL HIGH (ref 70–99)
Glucose-Capillary: 176 mg/dL — ABNORMAL HIGH (ref 70–99)
Glucose-Capillary: 139 mg/dL — ABNORMAL HIGH (ref 70–99)

## 2020-05-11 LAB — I-STAT BETA HCG BLOOD, ED (MC, WL, AP ONLY): I-stat hCG, quantitative: <5 m[IU]/mL (ref <5)

## 2020-05-11 LAB — TROPONIN I (HIGH SENSITIVITY)
Troponin I (High Sensitivity): 2 ng/L (ref <18)
Troponin I (High Sensitivity): 2 ng/L (ref <18)

## 2020-05-11 LAB — LIPASE, BLOOD: Lipase: 20 U/L (ref 11–51)

## 2020-05-11 MED ORDER — ONDANSETRON HCL 4 MG/2ML IJ SOLN: 4.0000 mg | Freq: Four times a day (QID) | INTRAMUSCULAR | Status: DC | PRN

## 2020-05-11 MED ORDER — POTASSIUM CHLORIDE 10 MEQ/100ML IV SOLN: 10.0000 meq | INTRAVENOUS | Status: AC

## 2020-05-11 MED ORDER — LACTATED RINGERS IV BOLUS
500.0000 mL | Freq: Once | INTRAVENOUS | Status: AC
  Administered 2020-05-11: 500 mL via INTRAVENOUS

## 2020-05-11 MED ORDER — FAMOTIDINE IN NACL 20-0.9 MG/50ML-% IV SOLN: 20.0000 mg | Freq: Two times a day (BID) | INTRAVENOUS | Status: DC

## 2020-05-11 MED ORDER — DEXTROSE IN LACTATED RINGERS 5 % IV SOLN: INTRAVENOUS | Status: DC

## 2020-05-11 MED ORDER — PANTOPRAZOLE SODIUM 40 MG PO TBEC
40.0000 mg | DELAYED_RELEASE_TABLET | Freq: Every day | ORAL | Status: DC
  Administered 2020-05-12: 40 mg via ORAL
  Filled 2020-05-11 (×2): qty 1

## 2020-05-11 MED ORDER — BUPROPION HCL ER (XL) 150 MG PO TB24
300.0000 mg | ORAL_TABLET | Freq: Every day | ORAL | Status: DC
  Administered 2020-05-12: 300 mg via ORAL
  Filled 2020-05-11 (×2): qty 2

## 2020-05-11 MED ORDER — HALOPERIDOL LACTATE 5 MG/ML IJ SOLN
5.0000 mg | Freq: Once | INTRAMUSCULAR | Status: AC
  Administered 2020-05-11: 5 mg via INTRAVENOUS
  Filled 2020-05-11: qty 1

## 2020-05-11 MED ORDER — ENOXAPARIN SODIUM 40 MG/0.4ML ~~LOC~~ SOLN
40.0000 mg | SUBCUTANEOUS | Status: DC
  Administered 2020-05-12: 40 mg via SUBCUTANEOUS
  Filled 2020-05-11: qty 0.4

## 2020-05-11 MED ORDER — PROMETHAZINE HCL 25 MG/ML IJ SOLN
12.5000 mg | Freq: Four times a day (QID) | INTRAMUSCULAR | Status: DC | PRN
  Administered 2020-05-12: 12.5 mg via INTRAVENOUS
  Filled 2020-05-11 (×2): qty 1

## 2020-05-11 MED ORDER — LACTATED RINGERS IV SOLN
INTRAVENOUS | Status: DC
Start: 2020-05-11 — End: 2020-05-12

## 2020-05-11 MED ORDER — DEXTROSE 50 % IV SOLN
0.0000 mL | INTRAVENOUS | Status: DC | PRN
  Administered 2020-05-12: 50 mL via INTRAVENOUS
  Filled 2020-05-11: qty 50

## 2020-05-11 MED ORDER — SODIUM CHLORIDE 0.9 % IV BOLUS
1000.0000 mL | Freq: Once | INTRAVENOUS | Status: AC
  Administered 2020-05-11: 1000 mL via INTRAVENOUS

## 2020-05-11 MED ORDER — HYDROXYZINE HCL 10 MG PO TABS
10.0000 mg | ORAL_TABLET | Freq: Three times a day (TID) | ORAL | Status: DC | PRN
  Filled 2020-05-11: qty 1

## 2020-05-11 MED ORDER — FAMOTIDINE IN NACL 20-0.9 MG/50ML-% IV SOLN
20.0000 mg | Freq: Once | INTRAVENOUS | Status: AC
  Administered 2020-05-12: 20 mg via INTRAVENOUS
  Filled 2020-05-11 (×2): qty 50

## 2020-05-11 MED ORDER — INSULIN REGULAR(HUMAN) IN NACL 100-0.9 UT/100ML-% IV SOLN
INTRAVENOUS | Status: DC
  Administered 2020-05-11: 10.5 [IU]/h via INTRAVENOUS
  Administered 2020-05-12: 2.2 [IU]/h via INTRAVENOUS
  Filled 2020-05-11: qty 100

## 2020-05-11 NOTE — ED Notes (Signed)
Dr. Opyd at bedside  

## 2020-05-11 NOTE — H&P (Signed)
History and Physical    Theresa [name] FXO:[id number] DOB: Jun 07, 1990 DOA: 05/11/2020  PCP: Simona Huh, NP   Patient coming from: Home   Chief Complaint: Abdominal pain, N/V, high CBG    HPI: Theresa [name] is a 30 y.o. female with medical history significant for insulin-dependent diabetes mellitus, depression, anxiety, ADHD, and GERD, now presenting to emergency department for evaluation of abdominal pain, nausea, vomiting, and elevated glucose.  Patient reports that she was in her usual state until she developed nausea and abdominal discomfort on 05/09/2020.  She then developed nonbloody vomiting after attempting to eat something.  Since then, symptoms have persisted.  She denies any associated fevers or diarrhea.  Last had a normal bowel movement several days ago.  She has history of cholecystectomy approximately 1 year ago and denies any other abdominal surgeries.  Patient reports that she follows with GI at [facility], had EGD a couple years ago, and was diagnosed with GERD.  She has tried Zofran and Phenergan at home without relief.  She has not been taking her insulin due to not eating.  CBG became elevated today at home.  ED Course: Upon arrival to the ED, patient is found to be afebrile, saturating well on room air, tachycardic to 120s, and with stable blood pressure.  EKG features sinus tachycardia.  Chemistry panel is notable for glucose 332, bicarbonate 19, and anion gap 17.  CBC with mild thrombocytosis.  Beta hydroxybutyrate is 4.15.  Troponin is normal x2.  Urinalysis notable for glucose and ketones.  UDS positive for amphetamines.  Patient was given a fluid bolus, Haldol, and started on insulin infusion in the ED.  Review of Systems:  All other systems reviewed and apart from HPI, are negative.  Past Medical History:  Diagnosis Date   Fibromyalgia    Fibromyalgia 06/28/2019   GERD (gastroesophageal reflux disease)    Headache    Insomnia    Insulin  dependent type 1 diabetes mellitus (HCC)    LGSIL of cervix of undetermined significance 10/2018   Colposcopy normal with ECC showing atypical fragments.  Recommend follow-up Pap smear/HPV at next annual exam.   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 with culture Archie Endo 07/11/2013   EYE SURGERY     MASS EXCISION Right 07/10/2013   Procedure: MINOR EXCISION RIGHT NECK CYST;  Surgeon: Rozetta Nunnery, MD;  Location: [place];  Service: ENT;  Laterality: Right;   Nexplanon  06/27/2018   PARS PLANA REPAIR OF RETINAL DEATACHMENT Right 11/04/2015   PARS PLANA VITRECTOMY Right 11/04/2015   Procedure: PARS PLANA VITRECTOMY WITH 25 GAUGE TO REPAIR A COMPLEX TRACTION RETINAL DETACHMENT;  Surgeon: Hayden Pedro, MD;  Location: [place];  Service: Ophthalmology;  Laterality: Right;   WISDOM TOOTH EXTRACTION  09/2015    Social History:   reports that she has never smoked. She has never used smokeless tobacco. She reports that she does not drink alcohol and does not use drugs.  Allergies  Allergen  Reactions   Tapentadol Swelling   Aloe Hives   Duloxetine Other (See Comments)    RESTLESSNESS, URGES TO MOVE   Gabapentin Swelling    SWELLING REACTION OF FEET    Olive Oil Hives   Pregabalin Other (See Comments)    Heart palpitations, Fainting, chest pain, sweats   Zonisamide Other (See Comments)    WORSENING PAIN    Family History  Problem Relation Age of Onset   Diabetes  Mother    Migraines Mother    Hypertension Mother    CVA Mother    Breast cancer Maternal Grandmother    Thyroid disease Maternal Grandmother    Cancer Maternal Grandfather      Prior to Admission medications   Medication Sig Start Date End Date Taking? Authorizing Provider  B-COMPLEX-C PO Take 1 capsule by mouth daily.    Yes [provider]  buPROPion (WELLBUTRIN XL) 300 MG 24 hr tablet Take one tablet daily Patient taking differently: Take 300 mg by mouth daily. 02/25/20  Yes Mozingo, Berdie Ogren, NP  cholecalciferol (VITAMIN D3) 25 MCG (1000 UNIT) tablet Take 1,000 Units by mouth daily.   Yes [provider]  diphenoxylate-atropine (LOMOTIL) 2.5-0.025 MG tablet Take 1 tablet by mouth 4 (four) times daily as needed. 04/16/20  Yes [provider]  etonogestrel (NEXPLANON) 68 MG IMPL implant 1 each by Subdermal route continuous.    Yes [provider]  hydrOXYzine (ATARAX/VISTARIL) 10 MG tablet Take 10 mg by mouth every 8 (eight) hours as needed for itching. 02/11/20  Yes [provider]  insulin aspart (NOVOLOG) 100 UNIT/ML FlexPen Inject 5-20 Units into the skin See admin instructions. Sliding scale: If blood sugar over 150, takes 5 units. Maximum 20 units.   Yes [provider]  insulin degludec (TRESIBA) 100 UNIT/ML FlexTouch Pen Inject 50 Units into the skin daily. 12/14/14  Yes [provider]  NARCAN 4 MG/0.1ML LIQD nasal spray kit Place 1 spray into the nose once. Overdose 01/25/20  Yes [provider]  omeprazole (PRILOSEC) 40 MG capsule Take 40 mg by mouth daily. 05/09/20  Yes [provider]  ondansetron (ZOFRAN-ODT) 8 MG disintegrating tablet Take 8 mg by mouth 3 (three) times daily as needed. 05/09/20  Yes [provider]  OVER THE COUNTER MEDICATION Take 1 Dose by mouth daily as needed (nausea/vomiting).   Yes [provider]  Promethazine HCl 6.25 MG/5ML SOLN Take 5 mLs by  mouth every 8 (eight) hours as needed (cough). 04/18/20  Yes [provider]  amoxicillin (AMOXIL) 500 MG tablet Take 500 mg by mouth See admin instructions. Patient not taking: Reported on 05/11/2020 04/24/20   [provider]  amphetamine-dextroamphetamine (ADDERALL) 20 MG tablet Take one and 1/2 tablets daily. Patient not taking: Reported on 05/11/2020 03/21/20   Thayer Headings, PMHNP  CONTOUR NEXT TEST test strip 1 each by Other route See admin instructions. 7 times daily 04/18/20   [provider]  fluconazole (DIFLUCAN) 200 MG tablet Take 200 mg by mouth every other day. Patient not taking: Reported on 05/11/2020 05/09/20   [provider]  Turmeric 500 MG CAPS Take 500 mg by mouth daily.     [provider]  famotidine (PEPCID) 20 MG tablet Take 1 tablet (20 mg total) by mouth 2 (two) times daily. Patient not taking: Reported on 07/05/2019 10/26/17 04/07/20  Doreatha Lew, MD    Physical Exam: Vitals:   05/11/20 1930 05/11/20 2000 05/11/20 2012 05/11/20 2045  BP:  (!) 140/92 (!) 140/92 (!) 145/85  Pulse: (!) 125 (!) 116 (!) 117 (!) 120  Resp:   16   Temp:      SpO2: 100% 100% 100% 100%    Constitutional: NAD, calm  Eyes: PERTLA, lids and conjunctivae normal ENMT: Mucous membranes are moist. Posterior pharynx clear of any exudate or lesions.   Neck: normal, supple, no masses, no thyromegaly Respiratory:  no wheezing, no crackles. No accessory muscle use.  Cardiovascular: Rate ~120 and regular. No extremity edema.   Abdomen: No distension, soft, generally tender, no rebound pain or guarding. Bowel sounds active.  Musculoskeletal: no clubbing / cyanosis. No joint deformity upper and lower extremities.   Skin: no significant rashes, lesions, ulcers. Warm, dry, well-perfused. Neurologic: CN 2-12 grossly intact. Sensation intact. Moving all extremities.  Psychiatric: Alert and oriented to person, place, and situation. Pleasant and cooperative.     Labs and Imaging on Admission: I have personally reviewed following labs and imaging studies  CBC: Recent Labs  Lab 05/11/20 1604 05/11/20 1753  WBC 9.0  --   HGB 11.8* 12.6  HCT 36.9 37.0  MCV 95.6  --   PLT 457*  --    Basic Metabolic Panel: Recent Labs  Lab 05/11/20 1604 05/11/20 1753  NA 135 137  K 4.4 4.1  CL 99  --   CO2 19*  --   GLUCOSE 332*  --   BUN 17  --   CREATININE 1.15*  --   CALCIUM 9.7  --    GFR: CrCl cannot be calculated (Unknown ideal weight.). Liver Function Tests: Recent Labs  Lab 05/11/20 1604  AST 14*  ALT 13  ALKPHOS 87  BILITOT 1.3*  PROT 7.6  ALBUMIN 3.9   Recent Labs  Lab 05/11/20 1604  LIPASE 20   No results for input(s): AMMONIA in the last 168 hours. Coagulation Profile: No results for input(s): INR, PROTIME in the last 168 hours. Cardiac Enzymes: No results for input(s): CKTOTAL, CKMB, CKMBINDEX, TROPONINI in the last 168 hours. BNP (last 3 results) No results for input(s): PROBNP in the last 8760 hours. HbA1C: No results for input(s): HGBA1C in the last 72 hours. CBG: Recent Labs  Lab 05/11/20 1948 05/11/20 2115  GLUCAP 324* 176*   Lipid Profile: No results for input(s): CHOL, HDL, LDLCALC, TRIG, CHOLHDL, LDLDIRECT in the last 72 hours. Thyroid Function Tests: No results for input(s): TSH, T4TOTAL, FREET4, T3FREE, THYROIDAB in the last 72 hours. Anemia Panel: No results for input(s): VITAMINB12, FOLATE, FERRITIN, TIBC, IRON, RETICCTPCT in the last 72 hours. Urine analysis:    Component Value Date/Time   COLORURINE YELLOW 05/11/2020 1826   APPEARANCEUR CLEAR 05/11/2020 1826   LABSPEC 1.022 05/11/2020 1826   PHURINE 5.0 05/11/2020 1826   GLUCOSEU >=500 (A) 05/11/2020 1826   HGBUR NEGATIVE 05/11/2020 1826   BILIRUBINUR NEGATIVE 05/11/2020 1826   BILIRUBINUR neg 07/19/2013 1519   KETONESUR 80 (A) 05/11/2020 1826   PROTEINUR NEGATIVE 05/11/2020 1826   UROBILINOGEN 0.2 02/25/2014 0524   NITRITE NEGATIVE  05/11/2020 1826   LEUKOCYTESUR NEGATIVE 05/11/2020 1826   Sepsis Labs: _0 (procalcitonin:4,lacticidven:4) )No results found for this or any previous visit (from the past 240 hour(s)).   Radiological Exams on Admission: No results found.  EKG: Independently reviewed. Sinus tachycardia, rate 120.   Assessment/Plan   1. Abdominal pain with N/V  - Presents with 3 days of recurrent abdominal pain with N/V   - Abdominal exam benign, lipase normal, patient reports  following with GI at [facility] and was diagnosed with GERD  - Check KUB, continue IVF and symptomatic care, monitor renal function and electrolytes, advance diet as she improves    2. Mild DKA, uncontrolled IDDM   - Serum glucose 332 with serum bicarb 17, AG 17, beta-hydroxybutyrate 4.15, and urine ketones  - A1c was 10.5% in January 2022  - She was given IVF bolus in ED and started on IV insulin infusion  - Continue IVF and insulin infusion with frequent CBGs and serial chem panels    3. Depression   - Continue home regimen as tolerated    DVT prophylaxis: Lovenox  Code Status: Full  Level of Care: Level of care: Progressive Family Communication: Aunt updated at bedside at patient's request  Disposition Plan:  Patient is from: Home  Anticipated d/c is to: Home  Anticipated d/c date is: Possibly as early as 05/12/20 Patient currently: Pending improvement in glucose, tolerance of diet  Consults called: None  Admission status: Observation     Vianne Bulls, MD [facility]  05/11/2020, 10:05 PM

## 2020-05-11 NOTE — ED Provider Notes (Signed)
[facility] EMERGENCY DEPARTMENT Provider Note   CSN: [id number] Arrival date & time: 05/11/20  1549     History Chief Complaint  Patient presents with  . Abdominal Pain    Theresa [name] is a 30 y.o. female.  30 year old female with history of diabetes and GERD presents with complaint of GERD for the past 2 or 3 days with vomiting.  Patient states Zofran and Phenergan at home are not working for her.  Patient has been monitoring her blood sugars, states that she has been adjusting her insulin as her blood sugar has been around the 100 for the past few days until today when her blood sugar went high.  Patient reports polydipsia, reports decreased urinary output.  Does have a history of DKA.  No other complaints or concerns at this time.        Past Medical History:  Diagnosis Date  . Fibromyalgia   . Fibromyalgia 06/28/2019  . GERD (gastroesophageal reflux disease)   . Headache   . Insomnia   . Insulin dependent type 1 diabetes mellitus ([place])   . 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  Recommend follow-up Pap smear/HPV at next annual exam.  . Nausea & vomiting 04/08/2020  . Peripheral neuropathy   . Seasonal allergies   . 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 DKA (diabetic ketoacidosis) ([place]) 05/11/2020  . Uncontrolled type 1 diabetes mellitus with hyperglycemia ([place]) 04/08/2020  . ADHD 04/08/2020  . Fibromyalgia 06/28/2019  . Gallstones 06/27/2019  . Tonsillith 05/09/2019  . Xerostomia 05/09/2019  . Diabetes mellitus with polyneuropathy ([place]) 04/10/2019  . CN (constipation) 04/10/2019  . Allergic rhinitis 04/10/2019  . Diabetes mellitus type 2, uncontrolled ([place]) 04/10/2019  . Abdominal pain, acute, generalized 04/03/2019  . Nausea and vomiting 04/03/2019  . Liver lesion, left lobe 10/26/2017   . Hypokalemia 10/26/2017  . Depression with anxiety 10/26/2017  . 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 Diabetes ([place]) 11/04/2015  . 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 Peripheral neuropathy, idiopathic 07/28/2015  . Insomnia 06/04/2014  . Total body pain 04/10/2014  . Neuropathic pain of both feet 02/27/2014  . Bilateral thoracic back pain 02/07/2014  . Infection of skin and subcutaneous tissue 11/21/2013  . Peripheral neuralgia 11/21/2013  . Fibrositis 09/29/2013  . Intractable nausea and vomiting 09/29/2013  . DKA (diabetic ketoacidoses) 07/26/2013  . Type 1 diabetes mellitus ([place]) 11/22/2011  . Yeast vaginitis 11/22/2011    Past Surgical History:  Procedure Laterality Date  . CATARACT EXTRACTION W/ INTRAOCULAR LENS  IMPLANT, BILATERAL Bilateral 05/2014 - 06/2014  . CHOLECYSTECTOMY N/A 06/27/2019   Procedure: LAPAROSCOPIC CHOLECYSTECTOMY;  Surgeon: Georganna Skeans, MD;  Location: [place];  Service: General;  Laterality: N/A;  . 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 EYE SURGERY    . MASS EXCISION Right 07/10/2013   Procedure: MINOR EXCISION RIGHT NECK CYST;  Surgeon: Rozetta Nunnery, MD;  Location: [place];  Service: ENT;  Laterality: Right;  . Nexplanon  06/27/2018  . PARS PLANA REPAIR OF RETINAL DEATACHMENT Right 11/04/2015  . PARS PLANA VITRECTOMY Right 11/04/2015   Procedure: PARS PLANA VITRECTOMY WITH 25 GAUGE TO REPAIR A COMPLEX TRACTION RETINAL DETACHMENT;  Surgeon: Hayden Pedro, MD;  Location: [place];  Service: Ophthalmology;  Laterality: Right;  . WISDOM TOOTH EXTRACTION  09/2015     OB History    Gravida  1   Para      Term      Preterm      AB  1   Living  0     SAB  1   IAB      Ectopic      Multiple      Live Births              Family History  Problem Relation Age of  Onset  . Diabetes Mother   . Migraines Mother   . Hypertension Mother   . CVA Mother   . Breast cancer Maternal Grandmother   . Thyroid disease Maternal Grandmother   . Cancer Maternal Grandfather     Social History   Tobacco Use  . Smoking status: Never Smoker  . Smokeless tobacco: Never Used  Vaping Use  . Vaping Use: Never used  Substance Use Topics  . Alcohol use: No    Alcohol/week: 0.0 standard drinks  . Drug use: No    Home Medications Prior to Admission medications   Medication Sig Start Date End Date Taking? Authorizing Provider  alum & mag hydroxide-simeth (MAALOX ADVANCED MAX ST) 400-400-40 MG/5ML suspension Take 15 mLs by mouth every 6 (six) hours as needed for indigestion. 07/02/19   Fatima Blank, MD  amphetamine-dextroamphetamine (ADDERALL) 20 MG tablet Take one and 1/2 tablets daily. Patient taking differently: Take 30 mg by mouth daily as needed (for work). 03/21/20   Thayer Headings, PMHNP  B-COMPLEX-C PO Take 1 capsule by mouth daily.     [provider]  buPROPion (WELLBUTRIN XL) 300 MG 24 hr tablet Take one tablet daily Patient taking differently: Take 300 mg by mouth daily. 02/25/20   Mozingo, Berdie Ogren, NP  cholecalciferol (VITAMIN D3) 25 MCG (1000 UNIT) tablet Take 1,000 Units by mouth daily.    [provider]  etonogestrel (NEXPLANON) 68 MG IMPL implant 1 each by Subdermal route continuous.     [provider]  ferrous sulfate 324 (65 Fe) MG TBEC Take 324 mg by mouth daily.  04/08/19   [provider]  hydrOXYzine (ATARAX/VISTARIL) 10 MG tablet Take 10 mg by mouth every 8 (eight) hours as needed for itching. 02/11/20   [provider]  insulin aspart (NOVOLOG) 100 UNIT/ML FlexPen Inject 5-20 Units into the skin See admin instructions. Sliding scale: If blood sugar over 150, takes 5 units. Maximum 20 units.    [provider]  insulin degludec (TRESIBA) 100 UNIT/ML FlexTouch Pen Inject 50  Units into the skin daily. 12/14/14   [provider]  modafinil (PROVIGIL) 200 MG tablet Take 300 mg by mouth daily.    [provider]  NARCAN 4 MG/0.1ML LIQD nasal spray kit Place 1 spray into the nose once. Overdose 01/25/20   [provider]  ondansetron (ZOFRAN-ODT) 4 MG disintegrating tablet Take 4 mg by mouth every 8 (eight) hours as needed for nausea or vomiting.  10/24/17   [provider]  oxyCODONE-acetaminophen (PERCOCET) 7.5-325 MG tablet Take 1 tablet by mouth 3 (three) times daily as needed for pain. 03/30/20   [provider]  Turmeric 500 MG CAPS Take 500 mg by mouth daily.     [provider]  venlafaxine XR (EFFEXOR-XR) 37.5 MG 24 hr capsule Take one capsule  daily. Patient taking differently: Take 37.5 mg by mouth daily with breakfast. 02/25/20   Mozingo, Berdie Ogren, NP  famotidine (PEPCID) 20 MG tablet Take 1 tablet (20 mg total) by mouth 2 (two) times daily. Patient not taking: Reported on 07/05/2019 10/26/17 04/07/20  Patrecia Pour, Christean Grief, MD    Allergies    Tapentadol, Aloe, Duloxetine, Gabapentin, Olive oil, Pregabalin, and Zonisamide  Review of Systems   Review of Systems  Constitutional: Negative for chills and fever.  Respiratory: Negative for shortness of breath.   Cardiovascular: Negative for chest pain.  Gastrointestinal: Positive for abdominal pain, nausea and vomiting. Negative for blood in stool, constipation and diarrhea.  Genitourinary: Positive for decreased urine volume. Negative for dysuria.  Musculoskeletal: Negative for arthralgias and myalgias.  Skin: Negative for rash.  Allergic/Immunologic: Positive for immunocompromised state.  Neurological: Negative for weakness.  All other systems reviewed and are negative.   Physical Exam Updated Vital Signs BP (!) 145/85   Pulse (!) 120   Temp 99.7 F (37.6 C)   Resp 16   SpO2 100%   Physical Exam Vitals and nursing note reviewed.   Constitutional:      General: She is not in acute distress.    Appearance: She is well-developed and well-nourished. She is obese. She is not diaphoretic.  HENT:     Head: Normocephalic and atraumatic.  Cardiovascular:     Rate and Rhythm: Regular rhythm. Tachycardia present.     Heart sounds: Normal heart sounds.  Pulmonary:     Effort: Pulmonary effort is normal. Tachypnea present.     Breath sounds: Normal breath sounds.  Abdominal:     Palpations: Abdomen is soft.     Tenderness: There is no abdominal tenderness.  Skin:    General: Skin is warm and dry.     Findings: No erythema or rash.  Neurological:     Mental Status: She is alert and oriented to person, place, and time.  Psychiatric:        Mood and Affect: Mood and affect normal.        Behavior: Behavior normal.     ED Results / Procedures / Treatments   Labs (all labs ordered are listed, but only abnormal results are displayed) Labs Reviewed  COMPREHENSIVE METABOLIC PANEL - Abnormal; Notable for the following components:      Result Value   CO2 19 (*)    Glucose, Bld 332 (*)    Creatinine, Ser 1.15 (*)    AST 14 (*)    Total Bilirubin 1.3 (*)    Anion gap 17 (*)    All other components within normal limits  CBC - Abnormal; Notable for the following components:   RBC 3.86 (*)    Hemoglobin 11.8 (*)    Platelets 457 (*)    All other components within normal limits  URINALYSIS, ROUTINE W REFLEX MICROSCOPIC - Abnormal; Notable for the following components:   Glucose, UA >=500 (*)    Ketones, ur 80 (*)    Bacteria, UA RARE (*)    All other components within normal limits  BETA-HYDROXYBUTYRIC ACID - Abnormal; Notable for the following components:   Beta-Hydroxybutyric Acid 4.15 (*)    All other components within normal limits  RAPID URINE DRUG SCREEN, HOSP PERFORMED - Abnormal; Notable for the following components:   Amphetamines POSITIVE (*)    All other components within normal limits  I-STAT VENOUS  BLOOD GAS, ED - Abnormal; Notable for the following components:  pH, Ven 7.467 (*)    pCO2, Ven 28.3 (*)    TCO2 21 (*)    Calcium, Ion 1.11 (*)    All other components within normal limits  CBG MONITORING, ED - Abnormal; Notable for the following components:   Glucose-Capillary 324 (*)    All other components within normal limits  CBG MONITORING, ED - Abnormal; Notable for the following components:   Glucose-Capillary 176 (*)    All other components within normal limits  LIPASE, BLOOD  BASIC METABOLIC PANEL  I-STAT BETA HCG BLOOD, ED ([facility], [facility], [facility] ONLY)  TROPONIN I (HIGH SENSITIVITY)  TROPONIN I (HIGH SENSITIVITY)    EKG EKG Interpretation  Date/Time:  Sunday May 11 2020 15:55:22 EST Ventricular Rate:  120 PR Interval:  148 QRS Duration: 76 QT Interval:  328 QTC Calculation: 463 R Axis:   43 Text Interpretation: Sinus tachycardia Right atrial enlargement Borderline ECG No significant change since last tracing Confirmed by Deno Etienne [phone]) on 05/11/2020 5:36:11 PM   Radiology No results found.  Procedures .Critical Care Performed by: Tacy Learn, PA-C Authorized by: Tacy Learn, PA-C   Critical care provider statement:    Critical care time (minutes):  45   Critical care was time spent personally by me on the following activities:  Discussions with consultants, evaluation of patient's response to treatment, examination of patient, ordering and performing treatments and interventions, ordering and review of laboratory studies, ordering and review of radiographic studies, pulse oximetry, re-evaluation of patient's condition, obtaining history from patient or surrogate and review of old charts     Medications Ordered in ED Medications  insulin regular, human (MYXREDLIN) 100 units/ 100 mL infusion (1.4 Units/hr Intravenous Rate/Dose Change 05/11/20 2118)  lactated ringers infusion ( Intravenous New Bag/Given 05/11/20 1949)  dextrose 5 % in lactated ringers infusion  ( Intravenous New Bag/Given 05/11/20 2122)  dextrose 50 % solution 0-50 mL (has no administration in time range)  sodium chloride 0.9 % bolus 1,000 mL (0 mLs Intravenous Stopped 05/11/20 2125)  haloperidol lactate (HALDOL) injection 5 mg (5 mg Intravenous Given 05/11/20 1935)    ED Course  I have reviewed the triage vital signs and the nursing notes.  Pertinent labs & imaging results that were available during my care of the patient were reviewed by me and considered in my medical decision making (see chart for details).  Clinical Course as of 05/11/20 2141  Sun May 11, 2221  6741 30 year old female with history of diabetes presents with vomiting for the past few days which she related to GERD, had control of her blood sugars until today when her blood sugar became elevated.  Patient's been unable to control vomiting at home despite Zofran and Phenergan. On exam, her abdomen is soft and nontender, appears to feel unwell, she is tachycardic.  IV fluids were ordered pending labs. Patient's CMP returns with glucose of 332, bicarb of 19 and anion gap of 17.  Patient has ketones in her urine as well as a beta hydroxybutyric acid of 4.15.  Endo tool was ordered for management of insulin and IV fluids.  Patient was given Haldol for her nausea and vomiting.  VBG returns with pH of 7.467.  Patient remains tachycardic, discussed with Dr. Tyrone Nine, ER attending, plan is to admit for DKA. [LM]  2141 Case discussed with Dr. Myna Hidalgo with [facility] who will consult for admission. [LM]    Clinical Course User Index [LM] Roque Lias   MDM  Rules/Calculators/A&P                          Final Clinical Impression(s) / ED Diagnoses Final diagnoses:  Diabetic ketoacidosis without coma associated with type 1 diabetes mellitus [facility])    Rx / DC Orders ED Discharge Orders    None       Tacy Learn, PA-C 05/11/20 2142    Deno Etienne, DO 05/11/20 2150

## 2020-05-11 NOTE — ED Triage Notes (Signed)
Pt reports epigastric pain, nausea, and vomiting since Friday.  States she has GERD and is taking medication without improvement.

## 2020-05-11 NOTE — ED Notes (Signed)
Pt provided ice water per her request

## 2020-05-12 DIAGNOSIS — E1010 Type 1 diabetes mellitus with ketoacidosis without coma: Secondary | ICD-10-CM | POA: Diagnosis not present

## 2020-05-12 LAB — BASIC METABOLIC PANEL
Anion gap: 10 (ref 5–15)
Anion gap: 8 (ref 5–15)
Anion gap: 11 (ref 5–15)
Anion gap: 9 (ref 5–15)
BUN: 13 mg/dL (ref 6–20)
BUN: 11 mg/dL (ref 6–20)
BUN: 8 mg/dL (ref 6–20)
BUN: 7 mg/dL (ref 6–20)
CO2: 23 mmol/L (ref 22–32)
CO2: 22 mmol/L (ref 22–32)
CO2: 20 mmol/L — ABNORMAL LOW (ref 22–32)
CO2: 22 mmol/L (ref 22–32)
Calcium: 8.9 mg/dL (ref 8.9–10.3)
Calcium: 8.8 mg/dL — ABNORMAL LOW (ref 8.9–10.3)
Calcium: 8.8 mg/dL — ABNORMAL LOW (ref 8.9–10.3)
Calcium: 8.9 mg/dL (ref 8.9–10.3)
Chloride: 102 mmol/L (ref 98–111)
Chloride: 105 mmol/L (ref 98–111)
Chloride: 105 mmol/L (ref 98–111)
Chloride: 106 mmol/L (ref 98–111)
Creatinine, Ser: 0.79 mg/dL (ref 0.44–1.00)
Creatinine, Ser: 0.83 mg/dL (ref 0.44–1.00)
Creatinine, Ser: 0.78 mg/dL (ref 0.44–1.00)
Creatinine, Ser: 0.78 mg/dL (ref 0.44–1.00)
GFR, Estimated: >60 mL/min (ref >60)
GFR, Estimated: >60 mL/min (ref >60)
GFR, Estimated: >60 mL/min (ref >60)
GFR, Estimated: >60 mL/min (ref >60)
Glucose, Bld: 61 mg/dL — ABNORMAL LOW (ref 70–99)
Glucose, Bld: 149 mg/dL — ABNORMAL HIGH (ref 70–99)
Glucose, Bld: 164 mg/dL — ABNORMAL HIGH (ref 70–99)
Glucose, Bld: 121 mg/dL — ABNORMAL HIGH (ref 70–99)
Potassium: 3.3 mmol/L — ABNORMAL LOW (ref 3.5–5.1)
Potassium: 3.3 mmol/L — ABNORMAL LOW (ref 3.5–5.1)
Potassium: 3.4 mmol/L — ABNORMAL LOW (ref 3.5–5.1)
Potassium: 3.7 mmol/L (ref 3.5–5.1)
Sodium: 135 mmol/L (ref 135–145)
Sodium: 135 mmol/L (ref 135–145)
Sodium: 136 mmol/L (ref 135–145)
Sodium: 137 mmol/L (ref 135–145)

## 2020-05-12 LAB — GLUCOSE, CAPILLARY
Glucose-Capillary: 101 mg/dL — ABNORMAL HIGH (ref 70–99)
Glucose-Capillary: 110 mg/dL — ABNORMAL HIGH (ref 70–99)
Glucose-Capillary: 128 mg/dL — ABNORMAL HIGH (ref 70–99)
Glucose-Capillary: 152 mg/dL — ABNORMAL HIGH (ref 70–99)
Glucose-Capillary: 156 mg/dL — ABNORMAL HIGH (ref 70–99)
Glucose-Capillary: 162 mg/dL — ABNORMAL HIGH (ref 70–99)
Glucose-Capillary: 163 mg/dL — ABNORMAL HIGH (ref 70–99)
Glucose-Capillary: 167 mg/dL — ABNORMAL HIGH (ref 70–99)
Glucose-Capillary: 173 mg/dL — ABNORMAL HIGH (ref 70–99)
Glucose-Capillary: 52 mg/dL — ABNORMAL LOW (ref 70–99)
Glucose-Capillary: 82 mg/dL (ref 70–99)
Glucose-Capillary: 94 mg/dL (ref 70–99)

## 2020-05-12 LAB — BETA-HYDROXYBUTYRIC ACID
Beta-Hydroxybutyric Acid: 0.18 mmol/L (ref 0.05–0.27)
Beta-Hydroxybutyric Acid: 0.51 mmol/L — ABNORMAL HIGH (ref 0.05–0.27)

## 2020-05-12 MED ORDER — POTASSIUM CHLORIDE CRYS ER 20 MEQ PO TBCR: 40.0000 meq | EXTENDED_RELEASE_TABLET | Freq: Once | ORAL | Status: DC

## 2020-05-12 MED ORDER — POTASSIUM CHLORIDE CRYS ER 20 MEQ PO TBCR
40.0000 meq | EXTENDED_RELEASE_TABLET | ORAL | Status: AC
Start: 2020-05-12 — End: 2020-05-12
  Administered 2020-05-12 (×2): 40 meq via ORAL
  Filled 2020-05-12 (×3): qty 2

## 2020-05-12 MED ORDER — INSULIN GLARGINE 100 UNIT/ML ~~LOC~~ SOLN
30.0000 [IU] | Freq: Every day | SUBCUTANEOUS | Status: DC
  Administered 2020-05-12: 30 [IU] via SUBCUTANEOUS
  Filled 2020-05-12: qty 0.3

## 2020-05-12 NOTE — Discharge Instructions (Signed)
 Diabetic Ketoacidosis Diabetic ketoacidosis (DKA) is a serious complication of diabetes. This condition develops when there is not enough insulin in the body. Insulin is a hormone that regulates blood sugar (glucose) levels in the body. Normally, insulin allows glucose to enter the cells in the body. The cells break down glucose for energy. Without enough insulin, the body cannot break down glucose and breaks down fats instead. This leads to high blood glucose levels in the body. It also leads to the production of acids that are called ketones. Ketones are poisonous at high levels. If diabetic ketoacidosis is not treated, it can cause severe dehydration and can lead to a coma or death. What are the causes? This condition develops when a lack of insulin causes the body to break down fats instead of glucose. This may be triggered by:  Stress on the body. This stress can be brought on by an illness.  Infection.  Medicines that raise blood glucose levels.  Not taking or skipping doses of diabetes medicines.  New onset of type 1 diabetes mellitus.  Missing insulin on purpose or by accident.  Interruption of insulin through an insulin pump. This can happen if the cannula that connects you to the insulin pump gets dislodged or kinked. What are the signs or symptoms? Symptoms of this condition include:  Excessive thirst or dry mouth.  Excessive urination.  Abdominal pain.  Nausea or vomiting.  Vision changes.  Fruity or sweet-smelling breath.  Weight loss.  Irritability or confusion.  Rapid breathing.  High blood glucose.  High levels of ketones in the body. To know your ketone levels: ? Collect urine in a small cup. ? Dip a test strip in the urine. ? Wait for it to change color. ? Compare the test strip results to the chart on the container. How is this diagnosed? This condition is diagnosed based on your medical history, a physical exam, and blood tests. You may also  have a urine test to check for ketones. How is this treated? This condition may be treated with:  Fluid replacement. This may be done with IV fluids to correct dehydration.  Correcting high blood glucose with insulin. This may be given through the skin as injections or through an IV.  Electrolyte replacement. Electrolytes are minerals in your blood. Electrolytes such as potassium and sodium may be given in pill form or through an IV.  Antibiotic medicines. These may be prescribed if your condition was caused by an infection. Diabetic ketoacidosis is a serious medical condition. You may need emergency treatment in the hospital so that you can be monitored closely. Follow these instructions at home: Medicines  Take over-the-counter and prescription medicines only as told by your health care provider.  Continue to take insulin and other diabetes medicines as told by your health care provider.  If you were prescribed an antibiotic medicine, take it as told by your health care provider. Do not stop taking the antibiotic even if you start to feel better. Eating and drinking  Drink enough fluids to keep your urine pale yellow.  If you are able to eat, follow your usual diet and drink sugar-free liquids such as water, tea, and sugar-free soft drinks. You can also have sugar-free gelatin or ice pops.  If you are not able to eat, drink liquids that contain sugar in small amounts as you are able. Liquids include fruit juice, regular soft drinks, and sherbet.   Checking ketones and blood glucose  Check your urine for   ketones when you are ill and as told by your health care provider. ? If your blood glucose is 240 mg/dL (13.3 mmol/L) or higher, check your urine ketones every 4 hours. If you have moderate or large ketones, call your health care provider.  Check your blood glucose every day, and as often as told by your health care provider. ? If your blood glucose is high, drink plenty of fluids.  This helps to flush out ketones. ? If your blood glucose is above your target for 2 tests in a row, contact your health care provider.   General instructions  Carry a medical alert card or wear medical alert jewelry that shows that you have diabetes.  Exercise only as told by your health care provider. Do not exercise when your blood glucose is high and you have ketones in your urine.  If you get sick, call your health care provider and begin treatment quickly. Your body often needs extra insulin to fight an illness. Check your blood glucose every 4 hours when you are sick.  Keep all follow-up visits as told by your health care provider. This is important. Where to find more information  American Diabetes Association: diabetes.org Contact a health care provider if:  Your blood glucose level is higher than 240 mg/dL (13.3 mmol/L) for 2 days in a row.  You have moderate or large ketones in your urine.  You have a fever.  You cannot eat or drink without vomiting.  You have been vomiting for more than 2 hours.  You continue to have symptoms of diabetic ketoacidosis.  You develop new symptoms. Get help right away if:  Your blood glucose monitor reads high even when you are taking insulin.  You faint.  You have chest pain.  You have trouble breathing.  You have sudden trouble speaking or swallowing.  You have vomiting or diarrhea that gets worse after 3 hours.  You are unable to stay awake.  You have trouble thinking.  You are severely dehydrated. Symptoms of severe dehydration include: ? Extreme thirst. ? Dry mouth. ? Rapid breathing. These symptoms may represent a serious problem that is an emergency. Do not wait to see if the symptoms will go away. Get medical help right away. Call your local emergency services (911 in the [place] Do not drive yourself to the hospital. Summary  Diabetic ketoacidosis is a serious complication of diabetes. This condition develops when  there is not enough insulin in the body.  This condition is diagnosed based on your medical history, a physical exam, and blood tests. You may also have a urine test to check for ketones.  Diabetic ketoacidosis is a serious medical condition. You may need emergency treatment in the hospital to monitor your condition.  Contact your health care provider if your blood glucose is higher than 240 mg/dL for 2 days in a row or if you have moderate or large ketones in your urine. This information is not intended to replace advice given to you by your health care provider. Make sure you discuss any questions you have with your health care provider. Document Revised: 01/17/2019 Document Reviewed: 01/17/2019 Elsevier Patient Education  2021 [employer]   =======  

## 2020-05-12 NOTE — Progress Notes (Signed)
Inpatient Diabetes Program Recommendations  AACE/ADA: New Consensus Statement on Inpatient Glycemic Control  Target Ranges:  Prepandial:   less than 140 mg/dL      Peak postprandial:   less than 180 mg/dL (1-2 hours)      Critically ill patients:  140 - 180 mg/dL   Results for Theresa [name], Theresa [name] (MRN [id number]) as of 05/12/2020 07:42  Ref. Range 05/12/2020 00:53 05/12/2020 02:09 05/12/2020 02:41 05/12/2020 03:04 05/12/2020 04:04 05/12/2020 05:05 05/12/2020 06:04 05/12/2020 07:06  Glucose-Capillary Latest Ref Range: 70 - 99 mg/dL 858 (H) 82 52 (L) 850 (H) 162 (H) 173 (H) 128 (H) 101 (H)  Results for Theresa [name], Theresa [name] (MRN [id number]) as of 05/12/2020 07:42  Ref. Range 05/11/2020 16:04 05/11/2020 17:40  Beta-Hydroxybutyric Acid Latest Ref Range: 0.05 - 0.27 mmol/L  4.15 (H)  Glucose Latest Ref Range: 70 - 99 mg/dL 676 (H)   Results for Theresa [name], Theresa [name] (MRN [id number]) as of 05/12/2020 07:42  Ref. Range 04/07/2020 19:33  Hemoglobin A1C Latest Ref Range: 4.8 - 5.6 % 10.5 (H)   Review of Glycemic Control  Diabetes history: DM1 (makes NO insulin; requires basal, correction, and carbohydrate coverage insulin) Outpatient Diabetes medications: Tresiba 50 units daily, Novolog 5-20 units TID with meals (for meal coverage and correction) Current orders for Inpatient glycemic control: IV insulin  Inpatient Diabetes Program Recommendations:    Insulin: Once MD is ready to transition from IV to SQ insulin, please consider ordering Lantus 18 units Q24H, CBGs Q4H, Novolog 0-9 units Q4H, and Novolog 3 units TID with meals for meal coverage if patient eats at least 50% of meals.  HbgA1C:  A1C 10.5% on 04/07/20 indicating an average glucose of 255 mg/dl over the past 2-3 months.  NOTE: Noted consult for diabetes coordinator. Patient was inpatient 04/08/20-04/10/20 and was seen by inpatient diabetes coordinator on 04/08/20. Patient has Type 1 DM, sees Dr. Sharl Ma (Endocrinologist), and has [facility]. Patient was admitted with mild  DKA with initial 332 mg/dl and was started on IV insulin. Will continue to follow while inpatient.  Thanks, Orlando Penner, RN, MSN, CDE Diabetes Coordinator Inpatient Diabetes Program [phone] (Team Pager from 8am to 5pm)

## 2020-05-12 NOTE — Progress Notes (Signed)
RN reviewed discharge paperwork with pt and her aunt. All questions addressed. IV and tele monitor removed. No further needs at this time. Pt left via private vehicle in stable condition with aunt.

## 2020-05-12 NOTE — Progress Notes (Addendum)
2336 Pt arrived to floor via stretcher and transferred into bed independantly. Endotool @ 1.9, LRD5 @ 184ml/hr. Oriented to floor. room, bed controls and call bell. Skin check completed w/ Adline Mango RN. Will cont to monitor.  0000 BS 163 insulin increased to 2.8 units/hr.  0101 BS 167 insulin increased to 3.2 units /hr.  0208 BS 82, insulin clamped per Endotool instructions.  0240 BS 52 , 47ml of D50 given per Endotool instruction.  0300 BS recheck 156- remain clamped.  0408 BS 162 insulin restarted @2 .2 units /hr.  0507 BS 173 insulin increased to 3 units/hr.  BS 128 insulin decreased to 1 unit/hr.  0707 BS 101 insulin decreased to 0.2 units /hr.

## 2020-05-12 NOTE — Discharge Summary (Signed)
Physician Discharge Summary  Theresa [name] BDZ:[id number] DOB: January 13, 1991 DOA: 05/11/2020  PCP: Theresa Huh, NP  Admit date: 05/11/2020 Discharge date: 05/12/2020 30 Day Unplanned Readmission Risk Score   Flowsheet Row ED to Hosp-Admission (Discharged) from 04/07/2020 in [facility]  SURGICAL  30 Day Unplanned Readmission Risk Score (%) 18.1 Filed at 04/10/2020 1200     This score is the patient's risk of an unplanned readmission within 30 days of being discharged (0 -100%). The score is based on dignosis, age, lab data, medications, orders, and past utilization.   Low:  0-14.9   Medium: 15-21.9   High: 22-29.9   Extreme: 30 and above         Admitted From: Home Disposition: Home  Recommendations for Outpatient Follow-up:  1. Follow up with PCP in 1-2 weeks 2. Please obtain BMP/CBC in one week 3. Please follow up with your PCP on the following pending results: Unresulted Labs (From admission, onward)          Start     Ordered   05/18/20 0500  Creatinine, serum  (enoxaparin (LOVENOX)    CrCl >/= 30 ml/min)  Weekly,   R     Comments: while on enoxaparin therapy    05/11/20 2145   05/11/20 3419  Basic metabolic panel  (Diabetes Ketoacidosis (DKA))  STAT Now then every 4 hours ,   STAT      05/11/20 2145   05/11/20 2145  Beta-hydroxybutyric acid  (Diabetes Ketoacidosis (DKA))  Now then every 8 hours,   R      05/11/20 2145            Home Health: None Equipment/Devices: None  Discharge Condition: Stable CODE STATUS: Full code Diet recommendation: Diabetic  Subjective: Seen and examined.  No complaints.  I offered her observation overnight but she wants to go home today.  HPI: Theresa [name] is a 30 y.o. female with medical history significant for insulin-dependent diabetes mellitus, depression, anxiety, ADHD, and GERD, now presenting to emergency department for evaluation of abdominal pain, nausea, vomiting, and elevated glucose.  Patient  reports that she was in her usual state until she developed nausea and abdominal discomfort on 05/09/2020.  She then developed nonbloody vomiting after attempting to eat something.  Since then, symptoms have persisted.  She denies any associated fevers or diarrhea.  Last had a normal bowel movement several days ago.  She has history of cholecystectomy approximately 1 year ago and denies any other abdominal surgeries.  Patient reports that she follows with GI at [facility], had EGD a couple years ago, and was diagnosed with GERD.  She has tried Zofran and Phenergan at home without relief.  She has not been taking her insulin due to not eating.  CBG became elevated today at home.  ED Course: Upon arrival to the ED, patient is found to be afebrile, saturating well on room air, tachycardic to 120s, and with stable blood pressure.  EKG features sinus tachycardia.  Chemistry panel is notable for glucose 332, bicarbonate 19, and anion gap 17.  CBC with mild thrombocytosis.  Beta hydroxybutyrate is 4.15.  Troponin is normal x2.  Urinalysis notable for glucose and ketones.  UDS positive for amphetamines.  Patient was given a fluid bolus, Haldol, and started on insulin infusion in the ED.  Brief/Interim Summary: Patient was admitted with DKA.  She was started on IV fluids, IV insulin per protocol.  Her anion gap closed.  She was transition to long-acting insulin.  She tolerated diet well.  Her anion gap remained closed.  She will be discharged in stable condition.  She had hypokalemia with potassium of 3.4 today.  She will receive replacement before discharge.  Discharge Diagnoses:  Principal Problem:   DKA (diabetic ketoacidosis) ([place]) Active Problems:   Depression with anxiety   Abdominal pain with vomiting   Uncontrolled type 1 diabetes mellitus with hyperglycemia ([place])    Discharge Instructions   Allergies as of 05/12/2020      Reactions   Tapentadol Swelling   Aloe Hives   Duloxetine Other  (See Comments)   RESTLESSNESS, URGES TO MOVE   Gabapentin Swelling   SWELLING REACTION OF FEET   Olive Oil Hives   Pregabalin Other (See Comments)   Heart palpitations, Fainting, chest pain, sweats   Zonisamide Other (See Comments)   WORSENING PAIN      Medication List    STOP taking these medications   amoxicillin 500 MG tablet Commonly known as: AMOXIL     TAKE these medications   amphetamine-dextroamphetamine 20 MG tablet Commonly known as: Adderall Take one and 1/2 tablets daily.   B-COMPLEX-C PO Take 1 capsule by mouth daily.   buPROPion 300 MG 24 hr tablet Commonly known as: WELLBUTRIN XL Take one tablet daily What changed:   how much to take  how to take this  when to take this  additional instructions   cholecalciferol 25 MCG (1000 UNIT) tablet Commonly known as: VITAMIN D3 Take 1,000 Units by mouth daily.   Contour Next Test test strip Generic drug: glucose blood 1 each by Other route See admin instructions. 7 times daily   diphenoxylate-atropine 2.5-0.025 MG tablet Commonly known as: LOMOTIL Take 1 tablet by mouth 4 (four) times daily as needed.   etonogestrel 68 MG Impl implant Commonly known as: NEXPLANON 1 each by Subdermal route continuous.   fluconazole 200 MG tablet Commonly known as: DIFLUCAN Take 200 mg by mouth every other day.   hydrOXYzine 10 MG tablet Commonly known as: ATARAX/VISTARIL Take 10 mg by mouth every 8 (eight) hours as needed for itching.   insulin aspart 100 UNIT/ML FlexPen Commonly known as: NOVOLOG Inject 5-20 Units into the skin See admin instructions. Sliding scale: If blood sugar over 150, takes 5 units. Maximum 20 units.   insulin degludec 100 UNIT/ML FlexTouch Pen Commonly known as: TRESIBA Inject 50 Units into the skin daily.   Narcan 4 MG/0.1ML Liqd nasal spray kit Generic drug: naloxone Place 1 spray into the nose once. Overdose   omeprazole 40 MG capsule Commonly known as: PRILOSEC Take 40 mg by  mouth daily.   ondansetron 8 MG disintegrating tablet Commonly known as: ZOFRAN-ODT Take 8 mg by mouth 3 (three) times daily as needed.   OVER THE COUNTER MEDICATION Take 1 Dose by mouth daily as needed (nausea/vomiting).   Promethazine HCl 6.25 MG/5ML Soln Take 5 mLs by mouth every 8 (eight) hours as needed (cough).   Turmeric 500 MG Caps Take 500 mg by mouth daily.       Follow-up Information    Theresa Huh, NP Follow up in 1 week(s).   Specialty: Nurse Practitioner Contact information: [place] [address] [phone]              Allergies  Allergen Reactions  . Tapentadol Swelling  . Aloe Hives  . Duloxetine Other (See Comments)    RESTLESSNESS, URGES TO MOVE  . Gabapentin  Swelling    SWELLING REACTION OF FEET   . Olive Oil Hives  . Pregabalin Other (See Comments)    Heart palpitations, Fainting, chest pain, sweats  . Zonisamide Other (See Comments)    WORSENING PAIN    Consultations: none   Procedures/Studies: DG Abd Portable 1V  Result Date: 05/11/2020 CLINICAL DATA:  Nausea and vomiting with epigastric pain, initial encounter EXAM: PORTABLE ABDOMEN - 1 VIEW COMPARISON:  07/05/2019 FINDINGS: Scattered large and small bowel gas is noted. Density is noted throughout the colon likely related to ingested material. No significant constipation or obstructive changes are seen. Findings of prior cholecystectomy are noted. No abnormal calcifications are seen. No bony abnormality is noted. IMPRESSION: No acute abnormality seen. Electronically Signed   By: Inez Catalina M.D.   On: 05/11/2020 22:23     Discharge Exam: Vitals:   05/12/20 1247 05/12/20 1249  BP: 124/80   Pulse: (!) 102 92  Resp: (!) 26 20  Temp:    SpO2: 99% 98%   Vitals:   05/12/20 0000 05/12/20 0805 05/12/20 1247 05/12/20 1249  BP: 111/62 113/71 124/80   Pulse: (!) 108 96 (!) 102 92  Resp: 16 20 (!) 26 20  Temp: 98.3 F (36.8 C) 98.2 F (36.8 C)    TempSrc: Oral  Oral    SpO2: 100% 98% 99% 98%  Weight:      Height:        General: Pt is alert, awake, not in acute distress Cardiovascular: RRR, S1/S2 +, no rubs, no gallops Respiratory: CTA bilaterally, no wheezing, no rhonchi Abdominal: Soft, NT, ND, bowel sounds + Extremities: no edema, no cyanosis    The results of significant diagnostics from this hospitalization (including imaging, microbiology, ancillary and laboratory) are listed below for reference.     Microbiology: Recent Results (from the past 240 hour(s))  Resp Panel by RT-PCR (Flu A&B, Covid) Nasopharyngeal Swab     Status: None   Collection Time: 05/11/20  9:49 PM   Specimen: Nasopharyngeal Swab; Nasopharyngeal(NP) swabs in vial transport medium  Result Value Ref Range Status   SARS Coronavirus 2 by RT PCR NEGATIVE NEGATIVE Final    Comment: (NOTE) SARS-CoV-2 target nucleic acids are NOT DETECTED.  The SARS-CoV-2 RNA is generally detectable in upper respiratory specimens during the acute phase of infection. The lowest concentration of SARS-CoV-2 viral copies this assay can detect is 138 copies/mL. A negative result does not preclude SARS-Cov-2 infection and should not be used as the sole basis for treatment or other patient management decisions. A negative result may occur with  improper specimen collection/handling, submission of specimen other than nasopharyngeal swab, presence of viral mutation(s) within the areas targeted by this assay, and inadequate number of viral copies(<138 copies/mL). A negative result must be combined with clinical observations, patient history, and epidemiological information. The expected result is Negative.  Fact Sheet for Patients:  EntrepreneurPulse.com.au  Fact Sheet for Healthcare Providers:  IncredibleEmployment.be  This test is no t yet approved or cleared by the [place] FDA and  has been authorized for detection and/or diagnosis of  SARS-CoV-2 by FDA under an Emergency Use Authorization (EUA). This EUA will remain  in effect (meaning this test can be used) for the duration of the COVID-19 declaration under Section 564(b)(1) of the Act, 21 U.S.C.section 360bbb-3(b)(1), unless the authorization is terminated  or revoked sooner.       Influenza A by PCR NEGATIVE NEGATIVE Final   Influenza B by PCR NEGATIVE NEGATIVE  Final    Comment: (NOTE) The Xpert Xpress SARS-CoV-2/FLU/RSV plus assay is intended as an aid in the diagnosis of influenza from Nasopharyngeal swab specimens and should not be used as a sole basis for treatment. Nasal washings and aspirates are unacceptable for Xpert Xpress SARS-CoV-2/FLU/RSV testing.  Fact Sheet for Patients: EntrepreneurPulse.com.au  Fact Sheet for Healthcare Providers: IncredibleEmployment.be  This test is not yet approved or cleared by the [place] FDA and has been authorized for detection and/or diagnosis of SARS-CoV-2 by FDA under an Emergency Use Authorization (EUA). This EUA will remain in effect (meaning this test can be used) for the duration of the COVID-19 declaration under Section 564(b)(1) of the Act, 21 U.S.C. section 360bbb-3(b)(1), unless the authorization is terminated or revoked.  Performed at [facility], [place] [address], [place], [place] [address]      Labs: BNP (last 3 results) No results for input(s): BNP in the last 8760 hours. Basic Metabolic Panel: Recent Labs  Lab 05/11/20 1604 05/11/20 1753 05/11/20 2310 05/12/20 0244 05/12/20 0546 05/12/20 1106  NA 135 137 136 135 135 136  K 4.4 4.1 3.8 3.3* 3.3* 3.4*  CL 99  --  103 102 105 105  CO2 19*  --  18* 23 22 20*  GLUCOSE 332*  --  156* 61* 149* 164*  BUN 17  --  _0 CREATININE 1.15*  --  0.92 0.79 0.83 0.78  CALCIUM 9.7  --  8.8* 8.9 8.8* 8.8*   Liver Function Tests: Recent Labs  Lab 05/11/20 1604  AST 14*  ALT 13  ALKPHOS 87   BILITOT 1.3*  PROT 7.6  ALBUMIN 3.9   Recent Labs  Lab 05/11/20 1604  LIPASE 20   No results for input(s): AMMONIA in the last 168 hours. CBC: Recent Labs  Lab 05/11/20 1604 05/11/20 1753  WBC 9.0  --   HGB 11.8* 12.6  HCT 36.9 37.0  MCV 95.6  --   PLT 457*  --    Cardiac Enzymes: No results for input(s): CKTOTAL, CKMB, CKMBINDEX, TROPONINI in the last 168 hours. BNP: Invalid input(s): POCBNP CBG: Recent Labs  Lab 05/12/20 0604 05/12/20 0706 05/12/20 0802 05/12/20 1026 05/12/20 1246  GLUCAP 128* 101* 94 152* 110*   D-Dimer No results for input(s): DDIMER in the last 72 hours. Hgb A1c No results for input(s): HGBA1C in the last 72 hours. Lipid Profile No results for input(s): CHOL, HDL, LDLCALC, TRIG, CHOLHDL, LDLDIRECT in the last 72 hours. Thyroid function studies No results for input(s): TSH, T4TOTAL, T3FREE, THYROIDAB in the last 72 hours.  Invalid input(s): FREET3 Anemia work up No results for input(s): VITAMINB12, FOLATE, FERRITIN, TIBC, IRON, RETICCTPCT in the last 72 hours. Urinalysis    Component Value Date/Time   COLORURINE YELLOW 05/11/2020 1826   APPEARANCEUR CLEAR 05/11/2020 1826   LABSPEC 1.022 05/11/2020 1826   PHURINE 5.0 05/11/2020 1826   GLUCOSEU >=500 (A) 05/11/2020 1826   HGBUR NEGATIVE 05/11/2020 1826   BILIRUBINUR NEGATIVE 05/11/2020 1826   BILIRUBINUR neg 07/19/2013 1519   KETONESUR 80 (A) 05/11/2020 1826   PROTEINUR NEGATIVE 05/11/2020 1826   UROBILINOGEN 0.2 02/25/2014 0524   NITRITE NEGATIVE 05/11/2020 1826   LEUKOCYTESUR NEGATIVE 05/11/2020 1826   Sepsis Labs Invalid input(s): PROCALCITONIN,  WBC,  LACTICIDVEN Microbiology Recent Results (from the past 240 hour(s))  Resp Panel by RT-PCR (Flu A&B, Covid) Nasopharyngeal Swab     Status: None   Collection Time: 05/11/20  9:49 PM  Specimen: Nasopharyngeal Swab; Nasopharyngeal(NP) swabs in vial transport medium  Result Value Ref Range Status   SARS Coronavirus 2 by RT PCR  NEGATIVE NEGATIVE Final    Comment: (NOTE) SARS-CoV-2 target nucleic acids are NOT DETECTED.  The SARS-CoV-2 RNA is generally detectable in upper respiratory specimens during the acute phase of infection. The lowest concentration of SARS-CoV-2 viral copies this assay can detect is 138 copies/mL. A negative result does not preclude SARS-Cov-2 infection and should not be used as the sole basis for treatment or other patient management decisions. A negative result may occur with  improper specimen collection/handling, submission of specimen other than nasopharyngeal swab, presence of viral mutation(s) within the areas targeted by this assay, and inadequate number of viral copies(<138 copies/mL). A negative result must be combined with clinical observations, patient history, and epidemiological information. The expected result is Negative.  Fact Sheet for Patients:  EntrepreneurPulse.com.au  Fact Sheet for Healthcare Providers:  IncredibleEmployment.be  This test is no t yet approved or cleared by the [place] FDA and  has been authorized for detection and/or diagnosis of SARS-CoV-2 by FDA under an Emergency Use Authorization (EUA). This EUA will remain  in effect (meaning this test can be used) for the duration of the COVID-19 declaration under Section 564(b)(1) of the Act, 21 U.S.C.section 360bbb-3(b)(1), unless the authorization is terminated  or revoked sooner.       Influenza A by PCR NEGATIVE NEGATIVE Final   Influenza B by PCR NEGATIVE NEGATIVE Final    Comment: (NOTE) The Xpert Xpress SARS-CoV-2/FLU/RSV plus assay is intended as an aid in the diagnosis of influenza from Nasopharyngeal swab specimens and should not be used as a sole basis for treatment. Nasal washings and aspirates are unacceptable for Xpert Xpress SARS-CoV-2/FLU/RSV testing.  Fact Sheet for Patients: EntrepreneurPulse.com.au  Fact Sheet for  Healthcare Providers: IncredibleEmployment.be  This test is not yet approved or cleared by the [place] FDA and has been authorized for detection and/or diagnosis of SARS-CoV-2 by FDA under an Emergency Use Authorization (EUA). This EUA will remain in effect (meaning this test can be used) for the duration of the COVID-19 declaration under Section 564(b)(1) of the Act, 21 U.S.C. section 360bbb-3(b)(1), unless the authorization is terminated or revoked.  Performed at [facility], [place] [address] [place], [place] [address]      Time coordinating discharge: Over 30 minutes  SIGNED:   Darliss Cheney, MD  [facility] 05/12/2020, 1:31 PM  If 7PM-7AM, please contact night-coverage [email or web address]

## 2020-05-13 NOTE — Telephone Encounter (Signed)
Pt called in today requesting refill for Adderall 20mg . She now wants the prescription to go to on [facility]

## 2020-05-14 ENCOUNTER — Other Ambulatory Visit: Payer: Self-pay | Admitting: Adult Health

## 2020-05-14 MED ORDER — AMPHETAMINE-DEXTROAMPHETAMINE 20 MG PO TABS
ORAL_TABLET | ORAL | 0 refills | Status: DC
Start: 2020-05-14 — End: 2020-06-12

## 2020-05-14 NOTE — Telephone Encounter (Signed)
Script sent  

## 2020-05-20 ENCOUNTER — Ambulatory Visit: Payer: BC Managed Care – PPO | Admitting: Adult Health

## 2020-05-22 ENCOUNTER — Telehealth: Payer: Self-pay | Admitting: Gastroenterology

## 2020-05-22 NOTE — Telephone Encounter (Signed)
Spoke with pt, she would like to establish care with Dr Christella Hartigan as her GI but it looks like the pt saw [facility] not too long ago, will see if she can fax over any gi notes to [place].

## 2020-05-23 ENCOUNTER — Encounter (INDEPENDENT_AMBULATORY_CARE_PROVIDER_SITE_OTHER): Payer: BC Managed Care – PPO | Admitting: Ophthalmology

## 2020-05-23 ENCOUNTER — Other Ambulatory Visit: Payer: Self-pay

## 2020-05-23 DIAGNOSIS — E103593 Type 1 diabetes mellitus with proliferative diabetic retinopathy without macular edema, bilateral: Secondary | ICD-10-CM

## 2020-05-23 DIAGNOSIS — H26493 Other secondary cataract, bilateral: Secondary | ICD-10-CM | POA: Diagnosis not present

## 2020-05-23 DIAGNOSIS — H43812 Vitreous degeneration, left eye: Secondary | ICD-10-CM

## 2020-05-26 NOTE — Progress Notes (Shared)
[facility] Note  05/28/2020     CHIEF COMPLAINT Patient presents for No chief complaint on file.   HISTORY OF PRESENT ILLNESS: Theresa [name] is a 30 y.o. female who presents to the clinic today for:     Referring physician: Simona Huh, NP [place],  [place] [address]  HISTORICAL INFORMATION:   Selected notes from the MEDICAL RECORD NUMBER Referred by Dr. Truman Hayward:  Ocular Hx- PMH-    CURRENT MEDICATIONS: No current outpatient medications on file. (Ophthalmic Drugs)   No current facility-administered medications for this visit. (Ophthalmic Drugs)   Current Outpatient Medications (Other)  Medication Sig  . amphetamine-dextroamphetamine (ADDERALL) 20 MG tablet Take one and 1/2 tablets daily.  . B-COMPLEX-C PO Take 1 capsule by mouth daily.   Marland Kitchen buPROPion (WELLBUTRIN XL) 300 MG 24 hr tablet Take one tablet daily (Patient taking differently: Take 300 mg by mouth daily.)  . cholecalciferol (VITAMIN D3) 25 MCG (1000 UNIT) tablet Take 1,000 Units by mouth daily.  . CONTOUR NEXT TEST test strip 1 each by Other route See admin instructions. 7 times daily  . diphenoxylate-atropine (LOMOTIL) 2.5-0.025 MG tablet Take 1 tablet by mouth 4 (four) times daily as needed.  . etonogestrel (NEXPLANON) 68 MG IMPL implant 1 each by Subdermal route continuous.   . fluconazole (DIFLUCAN) 200 MG tablet Take 200 mg by mouth every other day. (Patient not taking: Reported on 05/11/2020)  . hydrOXYzine (ATARAX/VISTARIL) 10 MG tablet Take 10 mg by mouth every 8 (eight) hours as needed for itching.  . insulin aspart (NOVOLOG) 100 UNIT/ML FlexPen Inject 5-20 Units into the skin See admin instructions. Sliding scale: If blood sugar over 150, takes 5 units. Maximum 20 units.  . insulin degludec (TRESIBA) 100 UNIT/ML FlexTouch Pen Inject 50 Units into the skin daily.  Marland Kitchen NARCAN 4 MG/0.1ML LIQD nasal spray kit Place 1 spray into the nose once. Overdose  . omeprazole  (PRILOSEC) 40 MG capsule Take 40 mg by mouth daily.  . ondansetron (ZOFRAN-ODT) 8 MG disintegrating tablet Take 8 mg by mouth 3 (three) times daily as needed.  Marland Kitchen OVER THE COUNTER MEDICATION Take 1 Dose by mouth daily as needed (nausea/vomiting).  . Promethazine HCl 6.25 MG/5ML SOLN Take 5 mLs by mouth every 8 (eight) hours as needed (cough).  . Turmeric 500 MG CAPS Take 500 mg by mouth daily.    No current facility-administered medications for this visit. (Other)      REVIEW OF SYSTEMS:    ALLERGIES Allergies  Allergen Reactions  . Tapentadol Swelling  . Aloe Hives  . Duloxetine Other (See Comments)    RESTLESSNESS, URGES TO MOVE  . Gabapentin Swelling    SWELLING REACTION OF FEET   . Olive Oil Hives  . Pregabalin Other (See Comments)    Heart palpitations, Fainting, chest pain, sweats  . Zonisamide Other (See Comments)    WORSENING PAIN    PAST MEDICAL HISTORY Past Medical History:  Diagnosis Date  . Fibromyalgia   . Fibromyalgia 06/28/2019  . GERD (gastroesophageal reflux disease)   . Headache   . Insomnia   . Insulin dependent type 1 diabetes mellitus ([place])   . 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  Recommend follow-up Pap smear/HPV at next annual exam.  . Nausea & vomiting 04/08/2020  . Peripheral neuropathy   . Seasonal allergies   . 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 CATARACT EXTRACTION W/ INTRAOCULAR LENS  IMPLANT, BILATERAL Bilateral 05/2014 - 06/2014  . CHOLECYSTECTOMY N/A 06/27/2019   Procedure: LAPAROSCOPIC CHOLECYSTECTOMY;  Surgeon: Georganna Skeans, MD;  Location: [facility];  Service: General;  Laterality: N/A;  . 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 EYE SURGERY    . MASS EXCISION Right 07/10/2013   Procedure: MINOR EXCISION RIGHT NECK  CYST;  Surgeon: Rozetta Nunnery, MD;  Location: [facility];  Service: ENT;  Laterality: Right;  . Nexplanon  06/27/2018  . PARS PLANA REPAIR OF RETINAL DEATACHMENT Right 11/04/2015  . PARS PLANA VITRECTOMY Right 11/04/2015   Procedure: PARS PLANA VITRECTOMY WITH 25 GAUGE TO REPAIR A COMPLEX TRACTION RETINAL DETACHMENT;  Surgeon: Hayden Pedro, MD;  Location: [facility];  Service: Ophthalmology;  Laterality: Right;  . WISDOM TOOTH EXTRACTION  09/2015    FAMILY HISTORY Family History  Problem Relation Age of Onset  . Diabetes Mother   . Migraines Mother   . Hypertension Mother   . CVA Mother   . Breast cancer Maternal Grandmother   . Thyroid disease Maternal Grandmother   . Cancer Maternal Grandfather     SOCIAL HISTORY Social History   Tobacco Use  . Smoking status: Never Smoker  . Smokeless tobacco: Never Used  Vaping Use  . Vaping Use: Never used  Substance Use Topics  . Alcohol use: No    Alcohol/week: 0.0 standard drinks  . Drug use: No         OPHTHALMIC EXAM: Not recorded     IMAGING AND PROCEDURES  Imaging and Procedures for 05/28/2020           ASSESSMENT/PLAN:  No diagnosis found.  1.  2.  3.  Ophthalmic Meds Ordered this visit:  No orders of the defined types were placed in this encounter.      No follow-ups on file.  There are no Patient Instructions on file for this visit.   Explained the diagnoses, plan, and follow up with the patient and they expressed understanding.  Patient expressed understanding of the importance of proper follow up care.   This document serves as a record of services personally performed by Gardiner Sleeper, MD, PhD. It was created on their behalf by Roselee Nova, COMT. The creation of this record is the provider's dictation and/or activities during the visit.  Electronically signed by: Roselee Nova, COMT 05/26/20 3:42 PM     Gardiner Sleeper, M.D., Ph.D. Diseases & Surgery of the Retina  and Vitreous [facility] _0 @     Abbreviations: M myopia (nearsighted); A astigmatism; H hyperopia (farsighted); P presbyopia; Mrx spectacle prescription;  CTL contact lenses; OD right eye; OS left eye; OU both eyes  XT exotropia; ET esotropia; PEK punctate epithelial keratitis; PEE punctate epithelial erosions; DES dry eye syndrome; MGD meibomian gland dysfunction; ATs artificial tears; PFAT's preservative free artificial tears; [place] nuclear sclerotic cataract; PSC posterior subcapsular cataract; ERM epi-retinal membrane; PVD posterior vitreous detachment; RD retinal detachment; DM diabetes mellitus; DR diabetic retinopathy; NPDR non-proliferative diabetic retinopathy; PDR proliferative diabetic retinopathy; CSME clinically significant macular edema; DME diabetic macular edema; dbh dot blot hemorrhages; CWS cotton wool spot; POAG primary open angle glaucoma; C/D cup-to-disc ratio; HVF humphrey visual field; GVF goldmann visual field; OCT optical coherence tomography; IOP intraocular pressure; BRVO Branch  retinal vein occlusion; CRVO central retinal vein occlusion; CRAO central retinal artery occlusion; BRAO branch retinal artery occlusion; RT retinal tear; SB scleral buckle; PPV pars plana vitrectomy; VH Vitreous hemorrhage; PRP panretinal laser photocoagulation; IVK intravitreal kenalog; VMT vitreomacular traction; MH Macular hole;  NVD neovascularization of the disc; NVE neovascularization elsewhere; AREDS age related eye disease study; ARMD age related macular degeneration; POAG primary open angle glaucoma; EBMD epithelial/anterior basement membrane dystrophy; ACIOL anterior chamber intraocular lens; IOL intraocular lens; PCIOL posterior chamber intraocular lens; Phaco/IOL phacoemulsification with intraocular lens placement; [place] photorefractive keratectomy; LASIK laser assisted in situ keratomileusis; HTN hypertension; DM diabetes mellitus; COPD chronic obstructive pulmonary  disease

## 2020-05-28 ENCOUNTER — Encounter (INDEPENDENT_AMBULATORY_CARE_PROVIDER_SITE_OTHER): Payer: BC Managed Care – PPO | Admitting: Ophthalmology

## 2020-05-28 ENCOUNTER — Other Ambulatory Visit: Payer: Self-pay

## 2020-05-28 DIAGNOSIS — H26492 Other secondary cataract, left eye: Secondary | ICD-10-CM | POA: Diagnosis not present

## 2020-06-02 ENCOUNTER — Other Ambulatory Visit: Payer: Self-pay

## 2020-06-02 DIAGNOSIS — F908 Attention-deficit hyperactivity disorder, other type: Secondary | ICD-10-CM

## 2020-06-02 DIAGNOSIS — F331 Major depressive disorder, recurrent, moderate: Secondary | ICD-10-CM

## 2020-06-02 DIAGNOSIS — F411 Generalized anxiety disorder: Secondary | ICD-10-CM

## 2020-06-02 MED ORDER — BUPROPION HCL ER (XL) 300 MG PO TB24: 300.0000 mg | ORAL_TABLET | Freq: Every day | ORAL | 0 refills | Status: DC

## 2020-06-05 NOTE — Telephone Encounter (Signed)
Hey Dr Christella Hartigan, this pt would like to establish her GI care with you for nausea/vomiting/gastric reflux but it looks like the pt was seen by [facility] this year, I will send the records to you for review, please advise on scheduling.

## 2020-06-06 ENCOUNTER — Encounter (INDEPENDENT_AMBULATORY_CARE_PROVIDER_SITE_OTHER): Payer: BC Managed Care – PPO | Admitting: Ophthalmology

## 2020-06-06 NOTE — Telephone Encounter (Signed)
I am happy to see her as a new patient.  Please offer her my first available new GI appointment.  Do not book with an extender and do not double book.  From her recent hemoglobin A1c levels it is clear that she has out-of-control diabetes.  I suspect that is at least playing somewhat of a role in her GI symptoms.

## 2020-06-10 ENCOUNTER — Encounter: Payer: Self-pay | Admitting: Gastroenterology

## 2020-06-10 NOTE — Telephone Encounter (Signed)
Booked for 07/16/20.

## 2020-06-10 NOTE — Telephone Encounter (Signed)
Left message to call back to sch consult. Records placed in records review drawer.

## 2020-06-11 ENCOUNTER — Telehealth: Payer: Self-pay | Admitting: Adult Health

## 2020-06-11 ENCOUNTER — Other Ambulatory Visit: Payer: Self-pay

## 2020-06-11 ENCOUNTER — Encounter (INDEPENDENT_AMBULATORY_CARE_PROVIDER_SITE_OTHER): Payer: BC Managed Care – PPO | Admitting: Ophthalmology

## 2020-06-11 DIAGNOSIS — H2703 Aphakia, bilateral: Secondary | ICD-10-CM

## 2020-06-11 NOTE — Telephone Encounter (Signed)
Next visit is 06/17/20. Requesting refill on Dextra Amphetamines 20 mg called to:  [id number] [place], [place] - [address] AT [facility] OF [facility] & [address] Phone:  [phone]  Fax:  [phone]

## 2020-06-11 NOTE — Telephone Encounter (Signed)
Please review

## 2020-06-12 ENCOUNTER — Other Ambulatory Visit: Payer: Self-pay

## 2020-06-12 MED ORDER — AMPHETAMINE-DEXTROAMPHETAMINE 20 MG PO TABS: ORAL_TABLET | ORAL | 0 refills | Status: DC

## 2020-06-12 NOTE — Telephone Encounter (Signed)
pended

## 2020-06-12 NOTE — Telephone Encounter (Signed)
Ok to pend.

## 2020-06-12 NOTE — Telephone Encounter (Signed)
Script sent  

## 2020-06-17 ENCOUNTER — Ambulatory Visit: Payer: BC Managed Care – PPO | Admitting: Adult Health

## 2020-07-04 ENCOUNTER — Other Ambulatory Visit (HOSPITAL_COMMUNITY): Payer: Self-pay | Admitting: Physician Assistant

## 2020-07-04 ENCOUNTER — Other Ambulatory Visit: Payer: Self-pay | Admitting: Physician Assistant

## 2020-07-04 DIAGNOSIS — R112 Nausea with vomiting, unspecified: Secondary | ICD-10-CM

## 2020-07-15 ENCOUNTER — Ambulatory Visit: Payer: BC Managed Care – PPO | Admitting: Adult Health

## 2020-07-16 ENCOUNTER — Ambulatory Visit: Payer: BC Managed Care – PPO | Admitting: Gastroenterology

## 2020-07-18 ENCOUNTER — Other Ambulatory Visit: Payer: Self-pay

## 2020-07-18 ENCOUNTER — Telehealth: Payer: Self-pay | Admitting: Adult Health

## 2020-07-18 MED ORDER — AMPHETAMINE-DEXTROAMPHETAMINE 20 MG PO TABS: ORAL_TABLET | ORAL | 0 refills | Status: DC

## 2020-07-18 NOTE — Telephone Encounter (Signed)
Script sent  

## 2020-07-18 NOTE — Telephone Encounter (Signed)
Pt called and asked for a refill on her adderall 20 mg to the [facility] on [address]. Pt has an appt on 5/16

## 2020-07-18 NOTE — Telephone Encounter (Signed)
Last filled 06/12/20.pended please make sure it goes to [place]

## 2020-07-21 ENCOUNTER — Telehealth (INDEPENDENT_AMBULATORY_CARE_PROVIDER_SITE_OTHER): Payer: BC Managed Care – PPO | Admitting: Adult Health

## 2020-07-21 ENCOUNTER — Encounter: Payer: Self-pay | Admitting: Adult Health

## 2020-07-21 DIAGNOSIS — F908 Attention-deficit hyperactivity disorder, other type: Secondary | ICD-10-CM

## 2020-07-21 DIAGNOSIS — F331 Major depressive disorder, recurrent, moderate: Secondary | ICD-10-CM

## 2020-07-21 DIAGNOSIS — F411 Generalized anxiety disorder: Secondary | ICD-10-CM

## 2020-07-21 DIAGNOSIS — G4700 Insomnia, unspecified: Secondary | ICD-10-CM | POA: Diagnosis not present

## 2020-07-21 MED ORDER — CLONAZEPAM 0.5 MG PO TABS: 0.5000 mg | ORAL_TABLET | Freq: Every day | ORAL | 2 refills | Status: DC

## 2020-07-21 MED ORDER — AMPHETAMINE-DEXTROAMPHETAMINE 20 MG PO TABS: ORAL_TABLET | ORAL | 0 refills | Status: DC

## 2020-07-21 MED ORDER — BUPROPION HCL ER (XL) 300 MG PO TB24: 300.0000 mg | ORAL_TABLET | Freq: Every day | ORAL | 0 refills | Status: DC

## 2020-07-21 MED ORDER — ESCITALOPRAM OXALATE 10 MG PO TABS: 10.0000 mg | ORAL_TABLET | Freq: Every day | ORAL | 5 refills | Status: DC

## 2020-07-21 NOTE — Progress Notes (Signed)
Theresa [name] [id number] 11/07/1990 30 y.o.  Virtual Visit via Video Note  I connected with pt @ on 07/21/20 at  3:00 PM EDT by a video enabled telemedicine application and verified that I am speaking with the correct person using two identifiers.   I discussed the limitations of evaluation and management by telemedicine and the availability of in person appointments. The patient expressed understanding and agreed to proceed.  I discussed the assessment and treatment plan with the patient. The patient was provided an opportunity to ask questions and all were answered. The patient agreed with the plan and demonstrated an understanding of the instructions.   The patient was advised to call back or seek an in-person evaluation if the symptoms worsen or if the condition fails to improve as anticipated.  I provided 30 minutes of non-face-to-face time during this encounter.  The patient was located at home.  The provider was located at [place].   Aloha Gell, NP   Subjective:   Patient ID:  Theresa [name] is a 30 y.o. (DOB 1990/12/10) female.  Chief Complaint: No chief complaint on file.   HPI Theresa [name] presents for follow-up of anxiety, depression, ADHD, and insomnia.   Describes mood today as "not very good". Pleasant. Flat. Mood symptoms - reports depression and anxiety. Denies irritability. Reports anxiety attacks - "mainly come at night". Mood has been "up and down". Stating "I'm having a lot of GI issues". Has tapered off of Effexor - finished taper in March. Has done some research on both Lexapro and Prozac and feels like she needs something to relpace the Effexor. Has continued taking the Adderall for focus and concentration. Decreased interest and motivation. Taking other medications as prescribed.  Energy levels lower - "pretty consistent for a while". Active, does not have a regular exercise routine.   Enjoys some usual interests and activities.  Single. Lives alone with dog - "[name]". Aunt lives 3 houses up from here. Family local.   Appetite decreased with GI issues. Weight loss - 200 pounds. Sleeps better some nights than others - some nights not at all. Difficulties with focus and concentration. Adderall helpful to get things done. Completing tasks. 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  Denies AH or VH.  Past medications for mental health diagnoses include: Elavil, Cymbalta, gabapentin caused swelling in her lower extremities, Horizant, Topamax, baclofen, Lunesta, Belsomra was effective, trazodone caused increased glucose sonar caused increased glucose, Modafinildid not help, Wellbutrin.  Allergies: Aloe, Duloxetine, Gabapentin, Olive oil, Lyrica [pregabalin], Tapentadol, and Zonisamide     Review of Systems:  Review of Systems  Musculoskeletal: Negative for gait problem.  Neurological: Negative for tremors.  Psychiatric/Behavioral:       Please refer to HPI    Medications: I have reviewed the patient's current medications.  Current Outpatient Medications  Medication Sig Dispense Refill  . clonazePAM (KLONOPIN) 0.5 MG tablet Take 1 tablet (0.5 mg total) by mouth daily. 30 tablet 2  . escitalopram (LEXAPRO) 10 MG tablet Take 1 tablet (10 mg total) by mouth daily. 30 tablet 5  . amphetamine-dextroamphetamine (ADDERALL) 20 MG tablet Take one tablet twice daily. 60 tablet 0  . B-COMPLEX-C PO Take 1 capsule by mouth daily.     Marland Kitchen buPROPion (WELLBUTRIN XL) 300 MG 24 hr tablet Take 1 tablet (300 mg total) by mouth daily. 30 tablet 0  . cholecalciferol (VITAMIN D3) 25 MCG (1000 UNIT) tablet Take 1,000 Units by mouth daily.    Marland Kitchen  CONTOUR NEXT TEST test strip 1 each by Other route See admin instructions. 7 times daily    . diphenoxylate-atropine (LOMOTIL) 2.5-0.025 MG tablet Take 1 tablet by mouth 4 (four) times daily as needed.    . etonogestrel (NEXPLANON) 68 MG IMPL implant 1 each by Subdermal  route continuous.     . fluconazole (DIFLUCAN) 200 MG tablet Take 200 mg by mouth every other day. (Patient not taking: Reported on 05/11/2020)    . hydrOXYzine (ATARAX/VISTARIL) 10 MG tablet Take 10 mg by mouth every 8 (eight) hours as needed for itching.    . insulin aspart (NOVOLOG) 100 UNIT/ML FlexPen Inject 5-20 Units into the skin See admin instructions. Sliding scale: If blood sugar over 150, takes 5 units. Maximum 20 units.    . insulin degludec (TRESIBA) 100 UNIT/ML FlexTouch Pen Inject 50 Units into the skin daily.    Marland Kitchen NARCAN 4 MG/0.1ML LIQD nasal spray kit Place 1 spray into the nose once. Overdose    . omeprazole (PRILOSEC) 40 MG capsule Take 40 mg by mouth daily.    . ondansetron (ZOFRAN-ODT) 8 MG disintegrating tablet Take 8 mg by mouth 3 (three) times daily as needed.    Marland Kitchen OVER THE COUNTER MEDICATION Take 1 Dose by mouth daily as needed (nausea/vomiting).    . Promethazine HCl 6.25 MG/5ML SOLN Take 5 mLs by mouth every 8 (eight) hours as needed (cough).    . Turmeric 500 MG CAPS Take 500 mg by mouth daily.      No current facility-administered medications for this visit.    Medication Side Effects: None  Allergies:  Allergies  Allergen Reactions  . Tapentadol Swelling  . Aloe Hives  . Duloxetine Other (See Comments)    RESTLESSNESS, URGES TO MOVE  . Gabapentin Swelling    SWELLING REACTION OF FEET   . Olive Oil Hives  . Pregabalin Other (See Comments)    Heart palpitations, Fainting, chest pain, sweats  . Zonisamide Other (See Comments)    WORSENING PAIN    Past Medical History:  Diagnosis Date  . Fibromyalgia   . Fibromyalgia 06/28/2019  . GERD (gastroesophageal reflux disease)   . Headache   . Insomnia   . 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  Recommend follow-up Pap smear/HPV at next annual exam.  . Nausea & vomiting 04/08/2020  .  Peripheral neuropathy   . Seasonal allergies   . 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 Diabetes Mother   . Migraines Mother   . Hypertension Mother   . CVA Mother   . Breast cancer Maternal Grandmother   . Thyroid disease Maternal Grandmother   . Cancer Maternal Grandfather     Social History   Socioeconomic History  . Marital status: Single    Spouse name: Not on file  . Number of children: Not on file  . Years of education: Not on file  . Highest education level: Not on file  Occupational History  . Not on file  Tobacco Use  . Smoking status: Never Smoker  . Smokeless tobacco: Never Used  Vaping Use  . Vaping Use: Never used  Substance and Sexual Activity  . Alcohol use: No    Alcohol/week: 0.0 standard drinks  . Drug use: No  . Sexual activity: Yes  Birth control/protection: Implant    Comment: 1ST INTERCOURSE- 16, PARTNERS - 6 . Nexplanon 06/27/2018  Other Topics Concern  . Not on file  Social History Narrative   Student.  Not married.  Lives alone in a one story home.   Not working.   Social Determinants of Health   Financial Resource Strain: Not on file  Food Insecurity: Not on file  Transportation Needs: Not on file  Physical Activity: Not on file  Stress: Not on file  Social Connections: Not on file  Intimate Partner Violence: Not on file    Past Medical History, Surgical history, Social history, and Family history were reviewed and updated as appropriate.   Please see review of systems for further details on the patient's review from today.   Objective:   Physical Exam:  There were no vitals taken for this visit.  Physical Exam Constitutional:      General: She is not in acute distress. Musculoskeletal:        General: No deformity.  Neurological:     Mental Status: She is alert and oriented to person, place, and time.     Coordination: Coordination normal.   Psychiatric:        Attention and Perception: Attention and perception normal. She does not perceive auditory or visual hallucinations.        Mood and Affect: Mood normal. Mood is not anxious or depressed. Affect is not labile, blunt, angry or inappropriate.        Speech: Speech normal.        Behavior: Behavior normal.        Thought Content: Thought content normal. Thought content is not paranoid or delusional. Thought content does not include homicidal or suicidal ideation. Thought content does not include homicidal or suicidal plan.        Cognition and Memory: Cognition and memory normal.        Judgment: Judgment normal.     Comments: Insight intact     Lab Review:     Component Value Date/Time   NA 137 05/12/2020 1357   K 3.7 05/12/2020 1357   CL 106 05/12/2020 1357   CO2 22 05/12/2020 1357   GLUCOSE 121 (H) 05/12/2020 1357   BUN 7 05/12/2020 1357   CREATININE 0.78 05/12/2020 1357   CREATININE 0.55 12/25/2013 1455   CALCIUM 8.9 05/12/2020 1357   PROT 7.6 05/11/2020 1604   ALBUMIN 3.9 05/11/2020 1604   AST 14 (L) 05/11/2020 1604   ALT 13 05/11/2020 1604   ALKPHOS 87 05/11/2020 1604   BILITOT 1.3 (H) 05/11/2020 1604   GFRNONAA >60 05/12/2020 1357   GFRAA >60 07/05/2019 1254       Component Value Date/Time   WBC 9.0 05/11/2020 1604   RBC 3.86 (L) 05/11/2020 1604   HGB 12.6 05/11/2020 1753   HCT 37.0 05/11/2020 1753   PLT 457 (H) 05/11/2020 1604   MCV 95.6 05/11/2020 1604   MCV 97.7 (A) 05/24/2013 2021   MCH 30.6 05/11/2020 1604   MCHC 32.0 05/11/2020 1604   RDW 12.9 05/11/2020 1604   LYMPHSABS 3.4 10/26/2017 0607   MONOABS 0.9 10/26/2017 0607   EOSABS 0.1 10/26/2017 0607   BASOSABS 0.1 10/26/2017 0607    No results found for: POCLITH, LITHIUM   No results found for: PHENYTOIN, PHENOBARB, VALPROATE, CBMZ   .res Assessment: Plan:    Plan:  Add Clonazepam 0.37m daily as needed Add Lexapro 120mdaily Wellbutrin XL 30065maily - denies seizure  history. Adderall 20 mg twice daily    124/80 92 - 05/14/20  RTC 4 weeks  Discussed potential benefits, risks, and side effects of stimulants with patient to include increased heart rate, palpitations, insomnia, increased anxiety, increased irritability, or decreased appetite.  Instructed patient to contact office if experiencing any significant tolerability issues.   Discussed potential benefits, risk, and side effects of benzodiazepines to include potential risk of tolerance and dependence, as well as possible drowsiness.  Advised patient not to drive if experiencing drowsiness and to take lowest possible effective dose to minimize risk of dependence and tolerance.      Diagnoses and all orders for this visit:  Insomnia, unspecified type  Attention deficit hyperactivity disorder (ADHD), other type -     amphetamine-dextroamphetamine (ADDERALL) 20 MG tablet; Take one tablet twice daily. -     buPROPion (WELLBUTRIN XL) 300 MG 24 hr tablet; Take 1 tablet (300 mg total) by mouth daily.  Major depressive disorder, recurrent episode, moderate (HCC) -     buPROPion (WELLBUTRIN XL) 300 MG 24 hr tablet; Take 1 tablet (300 mg total) by mouth daily. -     escitalopram (LEXAPRO) 10 MG tablet; Take 1 tablet (10 mg total) by mouth daily.  Generalized anxiety disorder -     buPROPion (WELLBUTRIN XL) 300 MG 24 hr tablet; Take 1 tablet (300 mg total) by mouth daily. -     clonazePAM (KLONOPIN) 0.5 MG tablet; Take 1 tablet (0.5 mg total) by mouth daily. -     escitalopram (LEXAPRO) 10 MG tablet; Take 1 tablet (10 mg total) by mouth daily.     Please see After Visit Summary for patient specific instructions.  Future Appointments  Date Time Provider Roland  07/29/2020 11:00 AM [facility] 2 [facility]  09/01/2020 10:10 AM Milus Banister, MD [facility]  12/11/2020  2:00 PM Hayden Pedro, MD [facility] None    No orders of the defined types were placed in this encounter.      -------------------------------

## 2020-07-29 ENCOUNTER — Ambulatory Visit (HOSPITAL_COMMUNITY)
Admission: RE | Admit: 2020-07-29 | Discharge: 2020-07-29 | Disposition: A | Discharge status: Home / Self Care | Payer: BC Managed Care – PPO | Source: Ambulatory Visit | Attending: Physician Assistant | Admitting: Physician Assistant

## 2020-07-29 ENCOUNTER — Other Ambulatory Visit: Payer: Self-pay

## 2020-07-29 DIAGNOSIS — R112 Nausea with vomiting, unspecified: Secondary | ICD-10-CM | POA: Insufficient documentation

## 2020-07-29 MED ORDER — TECHNETIUM TC 99M SULFUR COLLOID
2.0000 | Freq: Once | INTRAVENOUS | Status: AC | PRN
  Administered 2020-07-29: 2 via ORAL

## 2020-07-30 ENCOUNTER — Telehealth: Payer: Self-pay | Admitting: Nutrition

## 2020-07-30 NOTE — Telephone Encounter (Signed)
Message left to call me to set up an appointment for insulin adjustments and carb counting

## 2020-09-01 ENCOUNTER — Ambulatory Visit: Payer: BC Managed Care – PPO | Admitting: Gastroenterology

## 2020-09-05 ENCOUNTER — Encounter: Payer: Self-pay | Admitting: Adult Health

## 2020-09-05 ENCOUNTER — Telehealth (INDEPENDENT_AMBULATORY_CARE_PROVIDER_SITE_OTHER): Payer: BC Managed Care – PPO | Admitting: Adult Health

## 2020-09-05 DIAGNOSIS — G4700 Insomnia, unspecified: Secondary | ICD-10-CM

## 2020-09-05 DIAGNOSIS — F908 Attention-deficit hyperactivity disorder, other type: Secondary | ICD-10-CM

## 2020-09-05 DIAGNOSIS — F331 Major depressive disorder, recurrent, moderate: Secondary | ICD-10-CM

## 2020-09-05 DIAGNOSIS — F411 Generalized anxiety disorder: Secondary | ICD-10-CM

## 2020-09-05 DIAGNOSIS — F988 Other specified behavioral and emotional disorders with onset usually occurring in childhood and adolescence: Secondary | ICD-10-CM | POA: Diagnosis not present

## 2020-09-05 MED ORDER — ESCITALOPRAM OXALATE 10 MG PO TABS: 10.0000 mg | ORAL_TABLET | Freq: Every day | ORAL | 5 refills | Status: DC

## 2020-09-05 MED ORDER — BUPROPION HCL ER (XL) 300 MG PO TB24: 300.0000 mg | ORAL_TABLET | Freq: Every day | ORAL | 5 refills | Status: DC

## 2020-09-05 MED ORDER — METHYLPHENIDATE HCL ER (OSM) 36 MG PO TBCR: 36.0000 mg | EXTENDED_RELEASE_TABLET | Freq: Every day | ORAL | 0 refills | Status: DC

## 2020-09-05 MED ORDER — CLONAZEPAM 0.5 MG PO TABS: 0.5000 mg | ORAL_TABLET | Freq: Every day | ORAL | 2 refills | Status: DC

## 2020-09-05 NOTE — Progress Notes (Signed)
Theresa [name] [id number] February 15, 1991 30 y.o.  Virtual Visit via Video Note  I connected with pt @ on 09/05/20 at  9:00 AM EDT by a video enabled telemedicine application and verified that I am speaking with the correct person using two identifiers.   I discussed the limitations of evaluation and management by telemedicine and the availability of in person appointments. The patient expressed understanding and agreed to proceed.  I discussed the assessment and treatment plan with the patient. The patient was provided an opportunity to ask questions and all were answered. The patient agreed with the plan and demonstrated an understanding of the instructions.   The patient was advised to call back or seek an in-person evaluation if the symptoms worsen or if the condition fails to improve as anticipated.  I provided 30 minutes of non-face-to-face time during this encounter.  The patient was located at home.  The provider was located at [place].   Aloha Gell, NP   Subjective:   Patient ID:  Theresa [name] is a 30 y.o. (DOB Aug 01, 1990) female.  Chief Complaint: No chief complaint on file.   HPI Theresa [name] presents for follow-up of anxiety, depression, ADHD, and insomnia.   Describes mood today as "so-so". Pleasant. Denies tearfulness. Mood symptoms - reports decreased depression. Denies anxiety and irritability. Denies panic attacks. Mood feels more "consistent. Feels like addition of Lexapro has helped overall mood. Stating "I am more consistent". Improved interest and motivation. Taking other medications as prescribed.  Energy levels lower. Active, does not have a regular exercise routine.   Enjoys some usual interests and activities. Single. Lives alone with dog - "[name]". Aunt lives 3 houses up from here. Family local.   Appetite improved - less GI issues. Weight stable - 200 pounds. Sleeps better some nights than others. Difficulties with focus and  concentration. Adderall has not been helpful. Would like to try Concerta or Vyvanse.  Completing tasks. 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  Denies AH or VH.  Past medications for mental health diagnoses include: Elavil, Cymbalta, gabapentin caused swelling in her lower extremities, Horizant, Topamax, baclofen, Lunesta, Belsomra was effective, trazodone caused increased glucose sonar caused increased glucose, Modafinil did not help, Wellbutrin.    Review of Systems:  Review of Systems  Musculoskeletal:  Negative for gait problem.  Neurological:  Negative for tremors.  Psychiatric/Behavioral:         Please refer to HPI   Medications: I have reviewed the patient's current medications.  Current Outpatient Medications  Medication Sig Dispense Refill   methylphenidate (CONCERTA) 36 MG PO CR tablet Take 1 tablet (36 mg total) by mouth daily. 30 tablet 0   B-COMPLEX-C PO Take 1 capsule by mouth daily.      buPROPion (WELLBUTRIN XL) 300 MG 24 hr tablet Take 1 tablet (300 mg total) by mouth daily. 30 tablet 5   cholecalciferol (VITAMIN D3) 25 MCG (1000 UNIT) tablet Take 1,000 Units by mouth daily.     clonazePAM (KLONOPIN) 0.5 MG tablet Take 1 tablet (0.5 mg total) by mouth daily. 30 tablet 2   CONTOUR NEXT TEST test strip 1 each by Other route See admin instructions. 7 times daily     dexlansoprazole (DEXILANT) 60 MG capsule Take 1 capsule by mouth daily.     diphenoxylate-atropine (LOMOTIL) 2.5-0.025 MG tablet Take 1 tablet by mouth 4 (four) times daily as needed.     escitalopram (LEXAPRO) 10 MG tablet Take  1 tablet (10 mg total) by mouth daily. 30 tablet 5   etonogestrel (NEXPLANON) 68 MG IMPL implant 1 each by Subdermal route continuous.      famotidine (PEPCID) 40 MG tablet Take 1 tablet by mouth daily.     fluconazole (DIFLUCAN) 200 MG tablet Take 200 mg by mouth every other day. (Patient not taking: Reported on 05/11/2020)     hydrOXYzine  (ATARAX/VISTARIL) 10 MG tablet Take 10 mg by mouth every 8 (eight) hours as needed for itching.     insulin aspart (NOVOLOG) 100 UNIT/ML FlexPen Inject 5-20 Units into the skin See admin instructions. Sliding scale: If blood sugar over 150, takes 5 units. Maximum 20 units.     insulin degludec (TRESIBA) 100 UNIT/ML FlexTouch Pen Inject 50 Units into the skin daily.     NARCAN 4 MG/0.1ML LIQD nasal spray kit Place 1 spray into the nose once. Overdose     ondansetron (ZOFRAN-ODT) 8 MG disintegrating tablet Take 8 mg by mouth 3 (three) times daily as needed.     OVER THE COUNTER MEDICATION Take 1 Dose by mouth daily as needed (nausea/vomiting).     Promethazine HCl 6.25 MG/5ML SOLN Take 5 mLs by mouth every 8 (eight) hours as needed (cough).     sucralfate (CARAFATE) 1 g tablet Take 1 g by mouth 4 (four) times daily.     Turmeric 500 MG CAPS Take 500 mg by mouth daily.      No current facility-administered medications for this visit.    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  Recommend follow-up Pap smear/HPV at next annual exam.   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    Family  History  Problem Relation Age of Onset   Diabetes Mother    Migraines Mother    Hypertension Mother    CVA Mother    Breast cancer Maternal Grandmother    Thyroid disease Maternal Grandmother    Cancer Maternal Grandfather     Social History   Socioeconomic History   Marital status: Single    Spouse name: Not on file   Number of children: Not on file   Years of education: Not on file   Highest education level: Not on file  Occupational History   Not on file  Tobacco Use   Smoking status: Never   Smokeless tobacco: Never  Vaping Use   Vaping Use: Never used  Substance and Sexual Activity   Alcohol use: No    Alcohol/week: 0.0 standard drinks   Drug use: No   Sexual activity: Yes  Birth control/protection: Implant    Comment: 1ST INTERCOURSE- 16, PARTNERS - 6 . Nexplanon 06/27/2018  Other Topics Concern   Not on file  Social History Narrative   Student.  Not married.  Lives alone in a one story home.   Not working.   Social Determinants of Health   Financial Resource Strain: Not on file  Food Insecurity: Not on file  Transportation Needs: Not on file  Physical Activity: Not on file  Stress: Not on file  Social Connections: Not on file  Intimate Partner Violence: Not on file    Past Medical History, Surgical history, Social history, and Family history were reviewed and updated as appropriate.   Please see review of systems for further details on the patient's review from today.   Objective:   Physical Exam:  There were no vitals taken for this visit.  Physical Exam Constitutional:      General: She is not in acute distress. Musculoskeletal:        General: No deformity.  Neurological:     Mental Status: She is alert and oriented to person, place, and time.     Coordination: Coordination normal.  Psychiatric:        Attention and Perception: Attention and perception normal. She does not perceive auditory or visual hallucinations.        Mood and  Affect: Mood normal. Mood is not anxious or depressed. Affect is not labile, blunt, angry or inappropriate.        Speech: Speech normal.        Behavior: Behavior normal.        Thought Content: Thought content normal. Thought content is not paranoid or delusional. Thought content does not include homicidal or suicidal ideation. Thought content does not include homicidal or suicidal plan.        Cognition and Memory: Cognition and memory normal.        Judgment: Judgment normal.     Comments: Insight intact    Lab Review:     Component Value Date/Time   NA 137 05/12/2020 1357   K 3.7 05/12/2020 1357   CL 106 05/12/2020 1357   CO2 22 05/12/2020 1357   GLUCOSE 121 (H) 05/12/2020 1357   BUN 7 05/12/2020 1357   CREATININE 0.78 05/12/2020 1357   CREATININE 0.55 12/25/2013 1455   CALCIUM 8.9 05/12/2020 1357   PROT 7.6 05/11/2020 1604   ALBUMIN 3.9 05/11/2020 1604   AST 14 (L) 05/11/2020 1604   ALT 13 05/11/2020 1604   ALKPHOS 87 05/11/2020 1604   BILITOT 1.3 (H) 05/11/2020 1604   GFRNONAA >60 05/12/2020 1357   GFRAA >60 07/05/2019 1254       Component Value Date/Time   WBC 9.0 05/11/2020 1604   RBC 3.86 (L) 05/11/2020 1604   HGB 12.6 05/11/2020 1753   HCT 37.0 05/11/2020 1753   PLT 457 (H) 05/11/2020 1604   MCV 95.6 05/11/2020 1604   MCV 97.7 (A) 05/24/2013 2021   MCH 30.6 05/11/2020 1604   MCHC 32.0 05/11/2020 1604   RDW 12.9 05/11/2020 1604   LYMPHSABS 3.4 10/26/2017 0607   MONOABS 0.9 10/26/2017 0607   EOSABS 0.1 10/26/2017 0607   BASOSABS 0.1 10/26/2017 0607    No results found for: POCLITH, LITHIUM   No results found for: PHENYTOIN, PHENOBARB, VALPROATE, CBMZ   .res Assessment: Plan:    Plan:  Clonazepam 0.67m daily as needed Lexapro 136mdaily Wellbutrin XL 30042maily - denies seizure history. D/C Adderall 20  mg twice daily   Add Concerta 10GY every morning  BP WNL per patient  RTC 4 weeks  Discussed potential benefits, risks, and side effects of  stimulants with patient to include increased heart rate, palpitations, insomnia, increased anxiety, increased irritability, or decreased appetite.  Instructed patient to contact office if experiencing any significant tolerability issues.   Discussed potential benefits, risk, and side effects of benzodiazepines to include potential risk of tolerance and dependence, as well as possible drowsiness.  Advised patient not to drive if experiencing drowsiness and to take lowest possible effective dose to minimize risk of dependence and tolerance. Diagnoses and all orders for this visit:  Attention deficit disorder, unspecified hyperactivity presence  Insomnia, unspecified type  Generalized anxiety disorder -     buPROPion (WELLBUTRIN XL) 300 MG 24 hr tablet; Take 1 tablet (300 mg total) by mouth daily. -     escitalopram (LEXAPRO) 10 MG tablet; Take 1 tablet (10 mg total) by mouth daily. -     clonazePAM (KLONOPIN) 0.5 MG tablet; Take 1 tablet (0.5 mg total) by mouth daily.  Major depressive disorder, recurrent episode, moderate (HCC) -     buPROPion (WELLBUTRIN XL) 300 MG 24 hr tablet; Take 1 tablet (300 mg total) by mouth daily. -     escitalopram (LEXAPRO) 10 MG tablet; Take 1 tablet (10 mg total) by mouth daily.  Attention deficit hyperactivity disorder (ADHD), other type -     buPROPion (WELLBUTRIN XL) 300 MG 24 hr tablet; Take 1 tablet (300 mg total) by mouth daily.  Other orders -     methylphenidate (CONCERTA) 36 MG PO CR tablet; Take 1 tablet (36 mg total) by mouth daily.    Please see After Visit Summary for patient specific instructions.  Future Appointments  Date Time Provider [place]  12/11/2020  2:00 PM Hayden Pedro, MD TRE-TRE None    No orders of the defined types were placed in this encounter.     -------------------------------

## 2020-09-09 ENCOUNTER — Ambulatory Visit: Payer: BC Managed Care – PPO | Admitting: Adult Health

## 2020-10-02 ENCOUNTER — Ambulatory Visit (INDEPENDENT_AMBULATORY_CARE_PROVIDER_SITE_OTHER): Payer: BC Managed Care – PPO | Admitting: Adult Health

## 2020-10-02 ENCOUNTER — Other Ambulatory Visit: Payer: Self-pay

## 2020-10-02 ENCOUNTER — Encounter: Payer: Self-pay | Admitting: Adult Health

## 2020-10-02 DIAGNOSIS — F410 Panic disorder [episodic paroxysmal anxiety] without agoraphobia: Secondary | ICD-10-CM

## 2020-10-02 DIAGNOSIS — F331 Major depressive disorder, recurrent, moderate: Secondary | ICD-10-CM | POA: Diagnosis not present

## 2020-10-02 DIAGNOSIS — F988 Other specified behavioral and emotional disorders with onset usually occurring in childhood and adolescence: Secondary | ICD-10-CM | POA: Diagnosis not present

## 2020-10-02 DIAGNOSIS — F411 Generalized anxiety disorder: Secondary | ICD-10-CM | POA: Diagnosis not present

## 2020-10-02 DIAGNOSIS — G4700 Insomnia, unspecified: Secondary | ICD-10-CM

## 2020-10-02 MED ORDER — ESCITALOPRAM OXALATE 10 MG PO TABS: ORAL_TABLET | ORAL | 5 refills | Status: DC

## 2020-10-02 MED ORDER — LISDEXAMFETAMINE DIMESYLATE 40 MG PO CAPS
40.0000 mg | ORAL_CAPSULE | ORAL | 0 refills | Status: DC
Start: 2020-10-02 — End: 2020-10-31

## 2020-10-02 NOTE — Progress Notes (Signed)
Theresa [name] [id number] May 25, 1990 30 y.o.  Subjective:   Patient ID:  Theresa [name] is a 30 y.o. (DOB 06-09-1990) female.  Chief Complaint: No chief complaint on file.   HPI Theresa [name] presents to the office today for follow-up of anxiety, depression, ADHD, and insomnia.   Describes mood today as "so-so". Pleasant. Denies tearfulness. Mood symptoms - reports depression, anxiety and irritability. Reports worry and rumination - "obsesses over things". Reports panic attacks - situational stressors. Mood remains consistent. Health more "normal" nothing emergent. Lacks energy - constant chronic fatigue. Does not feel like the addition of Concerta has been helpful for focus and concentration. Feels like Lexapro has been helpful and would like to increase dose from 16m to 147mdaily. Varying interest and motivation. Taking other medications as prescribed.  Energy levels lower. Active, does not have a regular exercise routine.   Enjoys some usual interests and activities. Single. Lives alone with dog - "[name]". Aunt lives 3 houses up from here. Family local.   Appetite improved - less GI issues. Weight stable - 200 pounds. Sleeps better some nights than others. Difficulties with focus and concentration.Completing tasks. 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  Denies AH or VH.   Past medications for mental health diagnoses include: Elavil, Cymbalta, gabapentin caused swelling in her lower extremities, Horizant, Topamax, baclofen, Lunesta, Belsomra was effective, trazodone caused increased glucose sonar caused increased glucose, Modafinil did not help, Wellbutrin.  PHQ2-9    Flowsheet Row Nutrition [facility] 11/24/2016 [facility] Nutrition from 08/24/2016 [facility] Nutrition from 07/20/2016 [facility] Nutrition from 01/15/2014 [facility]  PHQ-2 Total Score 0 0 0 0      Flowsheet Row ED to [facility] Most recent reading at 05/11/2020  3:54 PM ED to [facility] ([facility] [facility] [place] at 04/07/2020  4:56 [facility] recent reading at 04/07/2020  4:11 PM  C-SSRS RISK CATEGORY No Risk No Risk No Risk        Review of Systems:  Review of Systems  Musculoskeletal:  Negative for gait problem.  Neurological:  Negative for tremors.  Psychiatric/Behavioral:         Please refer to HPI   Medications: I have reviewed the patient's current medications.  Current Outpatient Medications  Medication Sig Dispense Refill   lisdexamfetamine (VYVANSE) 40 MG capsule Take 1 capsule (40 mg total) by mouth every morning. 30 capsule 0   B-COMPLEX-C PO Take 1 capsule by mouth daily.      buPROPion (WELLBUTRIN XL) 300 MG 24 hr tablet Take 1 tablet (300 mg total) by mouth daily. 30 tablet 5   cholecalciferol (VITAMIN D3) 25 MCG (1000 UNIT) tablet Take 1,000 Units by mouth daily.     clonazePAM (KLONOPIN) 0.5 MG tablet Take 1 tablet (0.5 mg total) by mouth daily. 30 tablet 2   CONTOUR NEXT TEST test strip 1 each by Other route See admin instructions. 7 times daily     dexlansoprazole (DEXILANT) 60 MG capsule Take 1 capsule by mouth daily.     diphenoxylate-atropine (LOMOTIL) 2.5-0.025 MG tablet Take 1 tablet by mouth 4 (four) times daily as needed.     escitalopram (LEXAPRO) 10 MG tablet Take one  and 1/2 tablets every day. 45 tablet 5   etonogestrel (NEXPLANON) 68 MG IMPL implant 1 each by Subdermal route continuous.      famotidine (PEPCID) 40 MG tablet Take 1 tablet by mouth daily.     fluconazole (DIFLUCAN) 200 MG tablet Take 200 mg by mouth every other day. (Patient not taking: Reported on 05/11/2020)     hydrOXYzine  (ATARAX/VISTARIL) 10 MG tablet Take 10 mg by mouth every 8 (eight) hours as needed for itching.     insulin aspart (NOVOLOG) 100 UNIT/ML FlexPen Inject 5-20 Units into the skin See admin instructions. Sliding scale: If blood sugar over 150, takes 5 units. Maximum 20 units.     insulin degludec (TRESIBA) 100 UNIT/ML FlexTouch Pen Inject 50 Units into the skin daily.     NARCAN 4 MG/0.1ML LIQD nasal spray kit Place 1 spray into the nose once. Overdose     ondansetron (ZOFRAN-ODT) 8 MG disintegrating tablet Take 8 mg by mouth 3 (three) times daily as needed.     OVER THE COUNTER MEDICATION Take 1 Dose by mouth daily as needed (nausea/vomiting).     Promethazine HCl 6.25 MG/5ML SOLN Take 5 mLs by mouth every 8 (eight) hours as needed (cough).     sucralfate (CARAFATE) 1 g tablet Take 1 g by mouth 4 (four) times daily.     Turmeric 500 MG CAPS Take 500 mg by mouth daily.      No current facility-administered medications for this visit.    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  Recommend follow-up Pap smear/HPV at next annual exam.   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    Past Medical  History, Surgical history, Social history, and Family history were reviewed and updated as appropriate.   Please see review of systems for further details on the patient's review from today.   Objective:   Physical Exam:  There were no vitals taken for this visit.  Physical Exam Constitutional:      General: She is not in acute distress. Musculoskeletal:        General: No deformity.  Neurological:     Mental Status: She is alert and oriented to person, place, and time.     Coordination: Coordination normal.  Psychiatric:        Attention and Perception: Attention and perception normal. She does not perceive auditory or visual hallucinations.        Mood and Affect: Mood normal. Mood is not anxious or depressed. Affect is not labile, blunt, angry or inappropriate.  Speech: Speech normal.        Behavior: Behavior normal.        Thought Content: Thought content normal. Thought content is not paranoid or delusional. Thought content does not include homicidal or suicidal ideation. Thought content does not include homicidal or suicidal plan.        Cognition and Memory: Cognition and memory normal.        Judgment: Judgment normal.     Comments: Insight intact    Lab Review:     Component Value Date/Time   NA 137 05/12/2020 1357   K 3.7 05/12/2020 1357   CL 106 05/12/2020 1357   CO2 22 05/12/2020 1357   GLUCOSE 121 (H) 05/12/2020 1357   BUN 7 05/12/2020 1357   CREATININE 0.78 05/12/2020 1357   CREATININE 0.55 12/25/2013 1455   CALCIUM 8.9 05/12/2020 1357   PROT 7.6 05/11/2020 1604   ALBUMIN 3.9 05/11/2020 1604   AST 14 (L) 05/11/2020 1604   ALT 13 05/11/2020 1604   ALKPHOS 87 05/11/2020 1604   BILITOT 1.3 (H) 05/11/2020 1604   GFRNONAA >60 05/12/2020 1357   GFRAA >60 07/05/2019 1254       Component Value Date/Time   WBC 9.0 05/11/2020 1604   RBC 3.86 (L) 05/11/2020 1604   HGB 12.6 05/11/2020 1753   HCT 37.0 05/11/2020 1753   PLT 457 (H) 05/11/2020 1604   MCV  95.6 05/11/2020 1604   MCV 97.7 (A) 05/24/2013 2021   MCH 30.6 05/11/2020 1604   MCHC 32.0 05/11/2020 1604   RDW 12.9 05/11/2020 1604   LYMPHSABS 3.4 10/26/2017 0607   MONOABS 0.9 10/26/2017 0607   EOSABS 0.1 10/26/2017 0607   BASOSABS 0.1 10/26/2017 0607    No results found for: POCLITH, LITHIUM   No results found for: PHENYTOIN, PHENOBARB, VALPROATE, CBMZ   .res Assessment: Plan:     Plan:  Clonazepam 0.30m daily as needed Increase Lexapro 145mto 1562maily Wellbutrin XL 300m11mily - denies seizure history. Add Vyvanse 40mg65mry morning - has tried Adderall and Concerta   [id number] 4 weeks  Discussed potential benefits, risks, and side effects of stimulants with patient to include increased heart rate, palpitations, insomnia, increased anxiety, increased irritability, or decreased appetite.  Instructed patient to contact office if experiencing any significant tolerability issues.   Discussed potential benefits, risk, and side effects of benzodiazepines to include potential risk of tolerance and dependence, as well as possible drowsiness.  Advised patient not to drive if experiencing drowsiness and to take lowest possible effective dose to minimize risk of dependence and tolerance. Diagnoses and all orders for this visit:   Diagnoses and all orders for this visit:  Attention deficit disorder, unspecified hyperactivity presence -     lisdexamfetamine (VYVANSE) 40 MG capsule; Take 1 capsule (40 mg total) by mouth every morning.  Insomnia, unspecified type  Generalized anxiety disorder -     escitalopram (LEXAPRO) 10 MG tablet; Take one and 1/2 tablets every day.  Major depressive disorder, recurrent episode, moderate (HCC) -     escitalopram (LEXAPRO) 10 MG tablet; Take one and 1/2 tablets every day.  Panic attacks    Please see After Visit Summary for patient specific instructions.  Future Appointments  Date Time Provider [place]   4:20 PM Roena Sassaman, ReginBerdie OgrenCP-CP None  12/11/2020  2:00 PM MatthHayden PedroTRE-TRE None    No orders of the defined types were placed in this  encounter.   -------------------------------

## 2020-10-03 ENCOUNTER — Telehealth: Payer: Self-pay | Admitting: Adult Health

## 2020-10-03 NOTE — Telephone Encounter (Signed)
Prior Authorization submitted and APPROVED for Vyvanse 40 mg effective 10/03/2020-10/02/2021 with [employer] ID# [id number]

## 2020-10-07 ENCOUNTER — Ambulatory Visit: Payer: BC Managed Care – PPO | Admitting: Adult Health

## 2020-10-23 ENCOUNTER — Telehealth: Payer: Self-pay

## 2020-10-23 NOTE — Telephone Encounter (Signed)
Prior Authorization submitted and approved for ESCITALOPRAM 10 MG #45/30 day effective 10/14/2020-10/13/2021 with [employer] ID# [id number]

## 2020-10-30 ENCOUNTER — Ambulatory Visit: Payer: BC Managed Care – PPO | Admitting: Adult Health

## 2020-10-30 NOTE — Progress Notes (Signed)
Patient late for appointment - r/s.

## 2020-10-31 ENCOUNTER — Telehealth: Payer: Self-pay | Admitting: Adult Health

## 2020-10-31 ENCOUNTER — Other Ambulatory Visit: Payer: Self-pay

## 2020-10-31 DIAGNOSIS — F988 Other specified behavioral and emotional disorders with onset usually occurring in childhood and adolescence: Secondary | ICD-10-CM

## 2020-10-31 MED ORDER — LISDEXAMFETAMINE DIMESYLATE 40 MG PO CAPS: 40.0000 mg | ORAL_CAPSULE | ORAL | 0 refills | Status: DC

## 2020-10-31 NOTE — Telephone Encounter (Signed)
Pended.

## 2020-10-31 NOTE — Telephone Encounter (Signed)
Next visit is 11/14/20. Requesting refill on Vyvanse 40 mg called to:  [id number] [place] Otto, [place] - [address] AT [facility] OF [facility] & [address]  Phone:  [phone]  Fax:  [phone]

## 2020-11-14 ENCOUNTER — Other Ambulatory Visit: Payer: Self-pay

## 2020-11-14 ENCOUNTER — Encounter: Payer: Self-pay | Admitting: Adult Health

## 2020-11-14 ENCOUNTER — Ambulatory Visit (INDEPENDENT_AMBULATORY_CARE_PROVIDER_SITE_OTHER): Payer: BC Managed Care – PPO | Admitting: Adult Health

## 2020-11-14 DIAGNOSIS — F331 Major depressive disorder, recurrent, moderate: Secondary | ICD-10-CM | POA: Diagnosis not present

## 2020-11-14 DIAGNOSIS — F908 Attention-deficit hyperactivity disorder, other type: Secondary | ICD-10-CM

## 2020-11-14 DIAGNOSIS — F411 Generalized anxiety disorder: Secondary | ICD-10-CM | POA: Diagnosis not present

## 2020-11-14 DIAGNOSIS — F988 Other specified behavioral and emotional disorders with onset usually occurring in childhood and adolescence: Secondary | ICD-10-CM

## 2020-11-14 MED ORDER — CLONAZEPAM 1 MG PO TABS: 1.0000 mg | ORAL_TABLET | Freq: Every day | ORAL | 2 refills | Status: DC

## 2020-11-14 MED ORDER — LISDEXAMFETAMINE DIMESYLATE 70 MG PO CAPS: 70.0000 mg | ORAL_CAPSULE | ORAL | 0 refills | Status: DC

## 2020-11-14 MED ORDER — ESCITALOPRAM OXALATE 20 MG PO TABS: ORAL_TABLET | ORAL | 5 refills | Status: DC

## 2020-11-14 MED ORDER — BUPROPION HCL ER (XL) 300 MG PO TB24: 300.0000 mg | ORAL_TABLET | Freq: Every day | ORAL | 5 refills | Status: DC

## 2020-11-14 NOTE — Progress Notes (Signed)
Theresa [name] [id number] March 29, 1990 30 y.o.  Subjective:   Patient ID:  Theresa [name] is a 30 y.o. (DOB [birth year]) female.  Chief Complaint: No chief complaint on file.   HPI Theresa [name] presents to the office today for follow-up of anxiety, depression, ADHD, and insomnia.   Describes mood today as "not very good". Pleasant. Increased tearfulness. Mood symptoms - reports depression and anxiety. Denies irritability. Reports increased worry and rumination - "getting things stuck in her head and can't let them go". Aunt recently fell. Has health issues of her own to manage. Reports panic attacks - "chest hurting when it happens". Mood remains consistent - "consistently down". Breaking out in hives - working with dermatologist. Low energy - "feels fatigued all the time". Decreased interest and motivation. Taking other medications as prescribed.  Energy levels lower. Active, does not have a regular exercise routine.   Enjoys some usual interests and activities. Single. Lives alone with dog - "[name]". Aunt lives 3 houses up from here. Family local.   Appetite improved. Weight stable - 200 pounds. Sleeps better some nights than others. Stating "I either sleep 10 hours or not at all" Difficulties with focus and concentration.Completing tasks. 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  Denies AH or VH.    PHQ2-9    Flowsheet Row [facility] Nutrition [facility] 08/24/2016 [facility] Nutrition from 07/20/2016 [facility] Nutrition from 01/15/2014 in [facility]  PHQ-2 Total Score 0 0 0 0      Flowsheet Row ED to Hosp-Admission (Discharged) from 05/11/2020 in [facility] Most recent reading at 05/11/2020  3:54 PM ED to Hosp-Admission (Discharged) from 04/07/2020 in [place] Most recent reading at 04/07/2020  4:56 PM ED from 04/07/2020 in [facility] [place] Most recent reading at 04/07/2020  4:11 PM  C-SSRS RISK CATEGORY No Risk No Risk No Risk        Review of Systems:  Review of Systems  Musculoskeletal:  Negative for gait problem.  Neurological:  Negative for tremors.  Psychiatric/Behavioral:         Please refer to HPI   Medications: I have reviewed the patient's current medications.  Current Outpatient Medications  Medication Sig Dispense Refill   B-COMPLEX-C PO Take 1 capsule by mouth daily.      buPROPion (WELLBUTRIN XL) 300 MG 24 hr tablet Take 1 tablet (300 mg total) by mouth daily. 30 tablet 5   cholecalciferol (VITAMIN D3) 25 MCG (1000 UNIT) tablet Take 1,000 Units by mouth daily.     clonazePAM (KLONOPIN) 1 MG tablet Take 1 tablet (1 mg total) by mouth at bedtime. 30 tablet 2   CONTOUR NEXT TEST test strip 1 each by Other route See admin instructions. 7 times daily     dexlansoprazole (DEXILANT) 60 MG capsule Take 1 capsule by mouth daily.     diphenoxylate-atropine (LOMOTIL) 2.5-0.025 MG tablet Take 1 tablet by mouth 4 (four) times daily as needed.     escitalopram (LEXAPRO) 20 MG tablet Take one and 1/2 tablets every day. 30 tablet 5   etonogestrel (NEXPLANON) 68 MG IMPL implant 1 each by Subdermal route continuous.      famotidine (PEPCID) 40 MG tablet Take 1 tablet by mouth daily.     fluconazole (DIFLUCAN) 200 MG tablet  Take 200 mg by mouth every other day. (Patient not taking: Reported on 05/11/2020)     hydrOXYzine (ATARAX/VISTARIL) 10 MG tablet Take 10 mg by mouth every 8 (eight) hours as needed for itching.     insulin aspart (NOVOLOG) 100 UNIT/ML FlexPen Inject 5-20 Units into the skin See admin instructions. Sliding scale: If blood sugar over 150, takes 5 units. Maximum 20 units.     insulin degludec (TRESIBA) 100 UNIT/ML FlexTouch Pen Inject 50 Units into the skin daily.      lisdexamfetamine (VYVANSE) 70 MG capsule Take 1 capsule (70 mg total) by mouth every morning. 30 capsule 0   NARCAN 4 MG/0.1ML LIQD nasal spray kit Place 1 spray into the nose once. Overdose     ondansetron (ZOFRAN-ODT) 8 MG disintegrating tablet Take 8 mg by mouth 3 (three) times daily as needed.     OVER THE COUNTER MEDICATION Take 1 Dose by mouth daily as needed (nausea/vomiting).     Promethazine HCl 6.25 MG/5ML SOLN Take 5 mLs by mouth every 8 (eight) hours as needed (cough).     sucralfate (CARAFATE) 1 g tablet Take 1 g by mouth 4 (four) times daily.     Turmeric 500 MG CAPS Take 500 mg by mouth daily.      No current facility-administered medications for this visit.    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  Recommend follow-up Pap smear/HPV at next annual exam.   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    Past Medical History, Surgical history, Social history, and Family history were reviewed and updated as appropriate.   Please see review of systems for further details on the patient's review from today.   Objective:   Physical Exam:  There were no vitals taken for this  visit.  Physical Exam Constitutional:      General: She is not in acute distress. Musculoskeletal:        General: No deformity.  Neurological:     Mental Status: She is alert and oriented to person, place, and time.     Coordination: Coordination normal.  Psychiatric:        Attention and Perception: Attention and perception normal. She does not perceive auditory or visual hallucinations.        Mood and Affect: Mood normal. Mood is not anxious or depressed. Affect is not labile, blunt, angry or inappropriate.        Speech: Speech normal.        Behavior: Behavior normal.        Thought Content: Thought content normal. Thought content  is not paranoid or delusional. Thought content does not include homicidal or suicidal ideation. Thought content does not include homicidal or suicidal plan.        Cognition and Memory: Cognition and memory normal.        Judgment: Judgment normal.     Comments: Insight intact    Lab Review:     Component Value Date/Time   NA 137 05/12/2020 1357   K 3.7 05/12/2020 1357   CL 106 05/12/2020 1357   CO2 22 05/12/2020 1357   GLUCOSE 121 (H) 05/12/2020 1357   BUN 7 05/12/2020 1357   CREATININE 0.78 05/12/2020 1357   CREATININE 0.55 12/25/2013 1455   CALCIUM 8.9 05/12/2020 1357   PROT 7.6 05/11/2020 1604   ALBUMIN 3.9 05/11/2020 1604   AST 14 (L) 05/11/2020 1604   ALT 13 05/11/2020 1604   ALKPHOS 87 05/11/2020 1604   BILITOT 1.3 (H) 05/11/2020 1604   GFRNONAA >60 05/12/2020 1357   GFRAA >60 07/05/2019 1254       Component Value Date/Time   WBC 9.0 05/11/2020 1604   RBC 3.86 (L) 05/11/2020 1604   HGB 12.6 05/11/2020 1753   HCT 37.0 05/11/2020 1753   PLT 457 (H) 05/11/2020 1604   MCV 95.6 05/11/2020 1604   MCV 97.7 (A) 05/24/2013 2021   MCH 30.6 05/11/2020 1604   MCHC 32.0 05/11/2020 1604   RDW 12.9 05/11/2020 1604   LYMPHSABS 3.4 10/26/2017 0607   MONOABS 0.9 10/26/2017 0607   EOSABS 0.1 10/26/2017 0607   BASOSABS 0.1 10/26/2017 0607     No results found for: POCLITH, LITHIUM   No results found for: PHENYTOIN, PHENOBARB, VALPROATE, CBMZ   .res Assessment: Plan:    Plan:  Clonazepam 0.101m daily as needed Increase Lexapro 141mto 2058maily Wellbutrin XL 300m67mily - denies seizure history. Increased Vyvanse 40mg72m70mg 69my morning - has tried Adderall and Concerta   BP120/DI264/15/834 weeks  Discussed potential benefits, risks, and side effects of stimulants with patient to include increased heart rate, palpitations, insomnia, increased anxiety, increased irritability, or decreased appetite.  Instructed patient to contact office if experiencing any significant tolerability issues.   Discussed potential benefits, risk, and side effects of benzodiazepines to include potential risk of tolerance and dependence, as well as possible drowsiness.  Advised patient not to drive if experiencing drowsiness and to take lowest possible effective dose to minimize risk of dependence and tolerance. Diagnoses and all orders for this visit:  Diagnoses and all orders for this visit:  Generalized anxiety disorder -     clonazePAM (KLONOPIN) 1 MG tablet; Take 1 tablet (1 mg total) by mouth at bedtime. -     escitalopram (LEXAPRO) 20 MG tablet; Take one and 1/2 tablets every day. -     buPROPion (WELLBUTRIN XL) 300 MG 24 hr tablet; Take 1 tablet (300 mg total) by mouth daily.  Major depressive disorder, recurrent episode, moderate (HCC) -     escitalopram (LEXAPRO) 20 MG tablet; Take one and 1/2 tablets every day. -     buPROPion (WELLBUTRIN XL) 300 MG 24 hr tablet; Take 1 tablet (300 mg total) by mouth daily.  Attention deficit disorder, unspecified hyperactivity presence -     lisdexamfetamine (VYVANSE) 70 MG capsule; Take 1 capsule (70 mg total) by mouth every morning.  Attention deficit hyperactivity disorder (ADHD), other type -     buPROPion (WELLBUTRIN XL) 300 MG 24 hr tablet; Take 1 tablet (300 mg total)  by mouth  daily.    Please see After Visit Summary for patient specific instructions.  Future Appointments  Date Time Provider [place]  12/11/2020  2:00 PM Hayden Pedro, MD [facility] None    No orders of the defined types were placed in this encounter.   -------------------------------

## 2020-12-05 ENCOUNTER — Ambulatory Visit: Payer: BC Managed Care – PPO | Admitting: Adult Health

## 2020-12-09 ENCOUNTER — Ambulatory Visit: Payer: BC Managed Care – PPO | Admitting: Adult Health

## 2020-12-09 NOTE — Progress Notes (Signed)
Patient no show appointment. ? ?

## 2020-12-11 ENCOUNTER — Encounter (INDEPENDENT_AMBULATORY_CARE_PROVIDER_SITE_OTHER): Payer: BC Managed Care – PPO | Admitting: Ophthalmology

## 2020-12-22 ENCOUNTER — Other Ambulatory Visit: Payer: Self-pay

## 2020-12-22 DIAGNOSIS — F411 Generalized anxiety disorder: Secondary | ICD-10-CM

## 2020-12-22 DIAGNOSIS — F331 Major depressive disorder, recurrent, moderate: Secondary | ICD-10-CM

## 2020-12-22 MED ORDER — ESCITALOPRAM OXALATE 20 MG PO TABS: ORAL_TABLET | ORAL | 5 refills | Status: DC

## 2020-12-26 ENCOUNTER — Other Ambulatory Visit: Payer: Self-pay

## 2020-12-26 ENCOUNTER — Telehealth: Payer: Self-pay | Admitting: Adult Health

## 2020-12-26 DIAGNOSIS — F988 Other specified behavioral and emotional disorders with onset usually occurring in childhood and adolescence: Secondary | ICD-10-CM

## 2020-12-26 MED ORDER — LISDEXAMFETAMINE DIMESYLATE 70 MG PO CAPS: 70.0000 mg | ORAL_CAPSULE | ORAL | 0 refills | Status: DC

## 2020-12-26 NOTE — Telephone Encounter (Signed)
Patient needs refill for Vyvanse 70mg . Appt 10/26. Ph: [phone] 11/26. Pharmacy [facility] [address] [place]

## 2020-12-26 NOTE — Telephone Encounter (Signed)
Pended.

## 2020-12-31 ENCOUNTER — Telehealth (INDEPENDENT_AMBULATORY_CARE_PROVIDER_SITE_OTHER): Payer: BC Managed Care – PPO | Admitting: Adult Health

## 2020-12-31 ENCOUNTER — Encounter: Payer: Self-pay | Admitting: Adult Health

## 2020-12-31 DIAGNOSIS — F411 Generalized anxiety disorder: Secondary | ICD-10-CM

## 2020-12-31 DIAGNOSIS — F988 Other specified behavioral and emotional disorders with onset usually occurring in childhood and adolescence: Secondary | ICD-10-CM | POA: Diagnosis not present

## 2020-12-31 DIAGNOSIS — F410 Panic disorder [episodic paroxysmal anxiety] without agoraphobia: Secondary | ICD-10-CM

## 2020-12-31 DIAGNOSIS — F331 Major depressive disorder, recurrent, moderate: Secondary | ICD-10-CM | POA: Diagnosis not present

## 2020-12-31 DIAGNOSIS — G4700 Insomnia, unspecified: Secondary | ICD-10-CM

## 2020-12-31 MED ORDER — CLONAZEPAM 1 MG PO TABS: 1.0000 mg | ORAL_TABLET | Freq: Every day | ORAL | 2 refills | Status: DC

## 2020-12-31 MED ORDER — AMPHETAMINE-DEXTROAMPHET ER 30 MG PO CP24
30.0000 mg | ORAL_CAPSULE | Freq: Two times a day (BID) | ORAL | 0 refills | Status: DC
Start: 2020-12-31 — End: 2021-01-09

## 2020-12-31 NOTE — Progress Notes (Signed)
Theresa [name] [id number] 01-27-1991 30 y.o.  Virtual Visit via Video Note  I connected with pt @ on 12/31/20 at  3:40 PM EDT by a video enabled telemedicine application and verified that I am speaking with the correct person using two identifiers.   I discussed the limitations of evaluation and management by telemedicine and the availability of in person appointments. The patient expressed understanding and agreed to proceed.  I discussed the assessment and treatment plan with the patient. The patient was provided an opportunity to ask questions and all were answered. The patient agreed with the plan and demonstrated an understanding of the instructions.   The patient was advised to call back or seek an in-person evaluation if the symptoms worsen or if the condition fails to improve as anticipated.  I provided 25 minutes of non-face-to-face time during this encounter.  The patient was located at home.  The provider was located at [place].   Aloha Gell, NP   Subjective:   Patient ID:  Theresa [name] is a 30 y.o. (DOB 1990-11-19) female.  Chief Complaint: No chief complaint on file.   HPI [name] presents for follow-up of panic attacks, anxiety, depression, ADHD, and insomnia.   Describes mood today as "better". Pleasant. Decreased tearfulness. Mood symptoms - denies depression irritability and anxiety. Decreased panic attacks. Reports mild worry and rumination. Feels like medications are helpful. Does not feel like the Vyvanse is as helpful as it was. Is willing try the Adderall XR agin to see if she gets a longer duration. Aunt recovering from fall - helping her out. Decreased interest and motivation. Taking other medications as prescribed.  Energy levels improved. Active, does not have a regular exercise routine.   Enjoys some usual interests and activities. Single. Lives alone with dog - "[name]". Aunt lives 3 houses up from here. Family local.    Appetite improved. Weight stable - 200 pounds. Sleeps better some nights than others. Averages "too much or not enough". Difficulties with focus and concentration.Completing tasks. 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  Denies AH or VH.  Review of Systems:  Review of Systems  Musculoskeletal:  Negative for gait problem.  Neurological:  Negative for tremors.  Psychiatric/Behavioral:         Please refer to HPI   Medications: I have reviewed the patient's current medications.  Current Outpatient Medications  Medication Sig Dispense Refill   B-COMPLEX-C PO Take 1 capsule by mouth daily.      buPROPion (WELLBUTRIN XL) 300 MG 24 hr tablet Take 1 tablet (300 mg total) by mouth daily. 30 tablet 5   cholecalciferol (VITAMIN D3) 25 MCG (1000 UNIT) tablet Take 1,000 Units by mouth daily.     clonazePAM (KLONOPIN) 1 MG tablet Take 1 tablet (1 mg total) by mouth at bedtime. 30 tablet 2   CONTOUR NEXT TEST test strip 1 each by Other route See admin instructions. 7 times daily     dexlansoprazole (DEXILANT) 60 MG capsule Take 1 capsule by mouth daily.     diphenoxylate-atropine (LOMOTIL) 2.5-0.025 MG tablet Take 1 tablet by mouth 4 (four) times daily as needed.     escitalopram (LEXAPRO) 20 MG tablet Take one tablet (20 mg) by mouth daily 30 tablet 5   etonogestrel (NEXPLANON) 68 MG IMPL implant 1 each by Subdermal route continuous.      famotidine (PEPCID) 40 MG tablet Take 1 tablet by mouth daily.  fluconazole (DIFLUCAN) 200 MG tablet Take 200 mg by mouth every other day. (Patient not taking: Reported on 05/11/2020)     hydrOXYzine (ATARAX/VISTARIL) 10 MG tablet Take 10 mg by mouth every 8 (eight) hours as needed for itching.     insulin aspart (NOVOLOG) 100 UNIT/ML FlexPen Inject 5-20 Units into the skin See admin instructions. Sliding scale: If blood sugar over 150, takes 5 units. Maximum 20 units.     insulin degludec (TRESIBA) 100 UNIT/ML FlexTouch Pen Inject  50 Units into the skin daily.     lisdexamfetamine (VYVANSE) 70 MG capsule Take 1 capsule (70 mg total) by mouth every morning. 30 capsule 0   NARCAN 4 MG/0.1ML LIQD nasal spray kit Place 1 spray into the nose once. Overdose     ondansetron (ZOFRAN-ODT) 8 MG disintegrating tablet Take 8 mg by mouth 3 (three) times daily as needed.     OVER THE COUNTER MEDICATION Take 1 Dose by mouth daily as needed (nausea/vomiting).     Promethazine HCl 6.25 MG/5ML SOLN Take 5 mLs by mouth every 8 (eight) hours as needed (cough).     sucralfate (CARAFATE) 1 g tablet Take 1 g by mouth 4 (four) times daily.     Turmeric 500 MG CAPS Take 500 mg by mouth daily.      No current facility-administered medications for this visit.    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  Recommend follow-up Pap smear/HPV at next annual exam.   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    Family History  Problem Relation Age of Onset   Diabetes Mother    Migraines Mother    Hypertension Mother    CVA Mother    Breast cancer Maternal Grandmother    Thyroid disease Maternal Grandmother    Cancer Maternal Grandfather      Social History   Socioeconomic History   Marital status: Single    Spouse name: Not on file   Number of children: Not on file   Years of education: Not on file   Highest education level: Not on file  Occupational History   Not on file  Tobacco Use   Smoking status: Never   Smokeless tobacco: Never  Vaping Use   Vaping Use: Never used  Substance and Sexual Activity   Alcohol use: No    Alcohol/week: 0.0 standard drinks   Drug use: No   Sexual activity: Yes    Birth control/protection: Implant    Comment: 1ST INTERCOURSE- 12, PARTNERS - 6 . 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  Lives alone in a one story home.   Not working.   Social Determinants of Health   Financial Resource Strain: Not on file  Food Insecurity: Not on file  Transportation Needs: Not on file  Physical Activity: Not on file  Stress: Not on file  Social Connections: Not on file  Intimate Partner Violence: Not on file    Past Medical History, Surgical history, Social history, and Family history were reviewed and updated as appropriate.   Please see review of systems for further details on the patient's review from today.   Objective:   Physical Exam:  There were no vitals taken for this visit.  Physical Exam Constitutional:      General: She is not in acute distress. Musculoskeletal:        General: No deformity.  Neurological:     Mental Status: She is alert and oriented to person, place, and time.     Coordination: Coordination normal.  Psychiatric:        Attention and Perception: Attention and perception normal. She does not perceive auditory or visual hallucinations.        Mood and Affect: Mood normal. Mood is not anxious or depressed. Affect is not labile, blunt, angry or inappropriate.        Speech: Speech normal.        Behavior: Behavior normal.        Thought Content: Thought content normal. Thought  content is not paranoid or delusional. Thought content does not include homicidal or suicidal ideation. Thought content does not include homicidal or suicidal plan.        Cognition and Memory: Cognition and memory normal.        Judgment: Judgment normal.     Comments: Insight intact    Lab Review:     Component Value Date/Time   NA 137 05/12/2020 1357   K 3.7 05/12/2020 1357   CL 106 05/12/2020 1357   CO2 22 05/12/2020 1357   GLUCOSE 121 (H) 05/12/2020 1357   BUN 7 05/12/2020 1357   CREATININE 0.78 05/12/2020 1357   CREATININE 0.55 12/25/2013 1455   CALCIUM 8.9 05/12/2020 1357   PROT 7.6 05/11/2020 1604   ALBUMIN 3.9 05/11/2020 1604   AST 14 (L) 05/11/2020 1604   ALT 13 05/11/2020 1604   ALKPHOS 87 05/11/2020 1604   BILITOT 1.3 (H) 05/11/2020 1604   GFRNONAA >60 05/12/2020 1357   GFRAA >60 07/05/2019 1254       Component Value Date/Time   WBC 9.0 05/11/2020 1604   RBC 3.86 (L) 05/11/2020 1604   HGB 12.6 05/11/2020 1753   HCT 37.0 05/11/2020 1753   PLT 457 (H) 05/11/2020 1604   MCV 95.6 05/11/2020 1604   MCV 97.7 (A) 05/24/2013 2021   MCH 30.6 05/11/2020 1604   MCHC 32.0 05/11/2020 1604   RDW 12.9 05/11/2020 1604   LYMPHSABS 3.4 10/26/2017 0607   MONOABS 0.9 10/26/2017 0607   EOSABS 0.1 10/26/2017 0607   BASOSABS 0.1 10/26/2017 0607    No results found for: POCLITH, LITHIUM   No results found for: PHENYTOIN, PHENOBARB, VALPROATE, CBMZ   .res Assessment: Plan:     Plan:  Clonazepam 0.$RemoveBefor'5mg'zlzxwaInFhZT$  daily as needed Lexapro $RemoveBefore'20mg'erLxHvrQjtuyM$  daily Wellbutrin XL $RemoveBefor'300mg'YfLUwJdLHMtQ$  daily - denies seizure history. D/C Vyvanse $RemoveBef'70mg'ZiGcqRACfQ$  every morning - has tried Adderall and Concerta Add Adderall XR $RemoveBef'30mg'uVKOZbLPga$  BID   RTC 4 weeks  Discussed potential benefits, risks, and side  effects of stimulants with patient to include increased heart rate, palpitations, insomnia, increased anxiety, increased irritability, or decreased appetite.  Instructed patient to contact office if experiencing any significant  tolerability issues.   Discussed potential benefits, risk, and side effects of benzodiazepines to include potential risk of tolerance and dependence, as well as possible drowsiness.  Advised patient not to drive if experiencing drowsiness and to take lowest possible effective dose to minimize risk of dependence and tolerance.  There are no diagnoses linked to this encounter.   Please see After Visit Summary for patient specific instructions.  Future Appointments  Date Time Provider [place]  01/22/2021  1:30 PM Hayden Pedro, MD [facility] None    No orders of the defined types were placed in this encounter.     -------------------------------

## 2021-01-02 ENCOUNTER — Telehealth: Payer: Self-pay | Admitting: Adult Health

## 2021-01-02 NOTE — Telephone Encounter (Signed)
PA submitted.

## 2021-01-02 NOTE — Telephone Encounter (Signed)
Adderall XR Prior Authorization needed. Pt has [employer].

## 2021-01-06 NOTE — Telephone Encounter (Signed)
Prior authorization for  AMPHETAMINE DEXTROAMPHETAMINE ER 30 mg 2 pills daily is DENIED, 1 pill per day is allowed. The request was for more than the highest dose recommended by the FDA.   Will let Rene Kocher know. Can try to appeal if warranted.

## 2021-01-07 NOTE — Telephone Encounter (Signed)
Noted. We can see how she does with one, but I think she will need the second one.

## 2021-01-09 ENCOUNTER — Other Ambulatory Visit: Payer: Self-pay

## 2021-01-09 DIAGNOSIS — F410 Panic disorder [episodic paroxysmal anxiety] without agoraphobia: Secondary | ICD-10-CM

## 2021-01-09 MED ORDER — AMPHETAMINE-DEXTROAMPHET ER 30 MG PO CP24: 30.0000 mg | ORAL_CAPSULE | Freq: Every day | ORAL | 0 refills | Status: DC

## 2021-01-09 NOTE — Telephone Encounter (Signed)
30mg.

## 2021-01-09 NOTE — Telephone Encounter (Signed)
We can aske her and try it.

## 2021-01-09 NOTE — Telephone Encounter (Signed)
Just spoke with her and she does agree to try the XR once daily  and have an IR daily to see if those last better then the Vyvanse she's currently taking. I explained she could adjust it what suits her best and her day. Also told her I'm probably going to have to do more PA's but I wasn't sure.  I will pend for the XR. Not sure what strength you want to try for her IR>

## 2021-01-09 NOTE — Telephone Encounter (Signed)
Could she also do an immediate release?

## 2021-01-12 IMAGING — CT CT ABD-PELV W/ CM
2 of 4 series · 16 of 46 positions shown, 18 images · IV contrast (omnipaque)
Comparison: 07/02/2019

CLINICAL DATA: Abdominal pain, history of recent cholecystectomy,
initial encounter

EXAM:
CT ABDOMEN AND PELVIS WITH CONTRAST
TECHNIQUE: Multidetector CT imaging of the abdomen and pelvis was performed
using the standard protocol following bolus administration of
intravenous contrast.
CONTRAST:  100mL OMNIPAQUE IOHEXOL 300 MG/ML  SOLN

[Series 2: abd/ pelvis 5.0 i30f 2 · axial · 0.98mm/px · z∈[+724,+1108]mm · 13 of 85 slices shown, 15 images]
[im 4/85  soft-tissue]
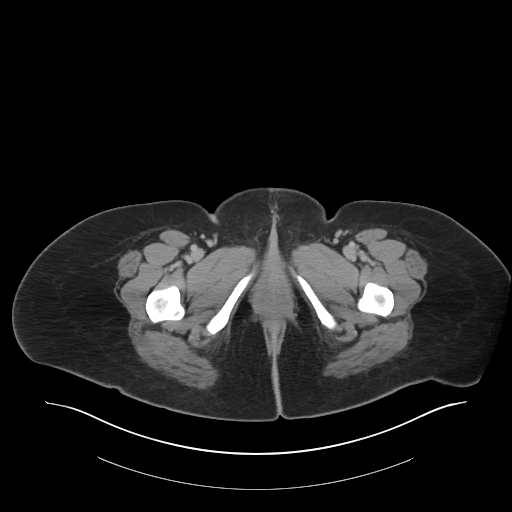
[im 4/85  bone]
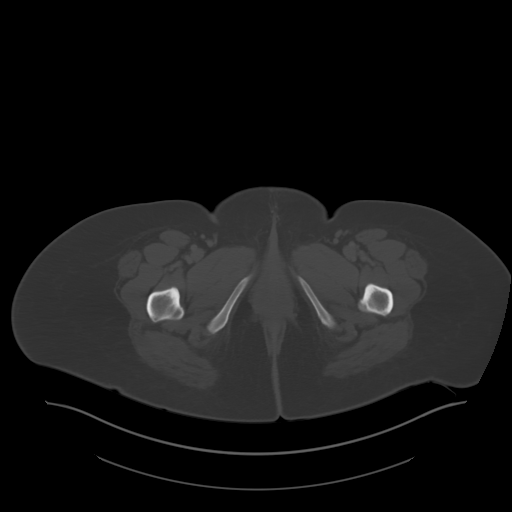
[im 10/85  soft-tissue]
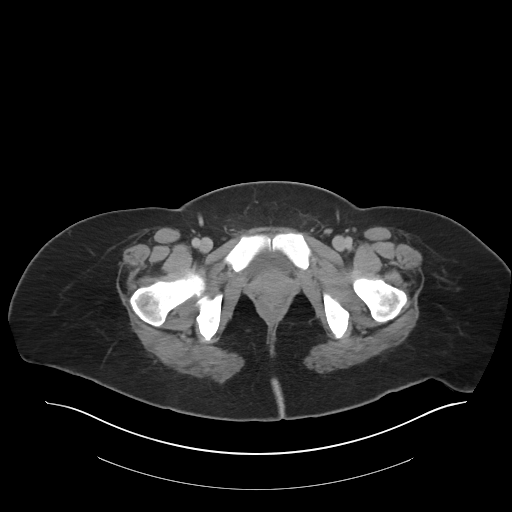
[im 17/85  soft-tissue]
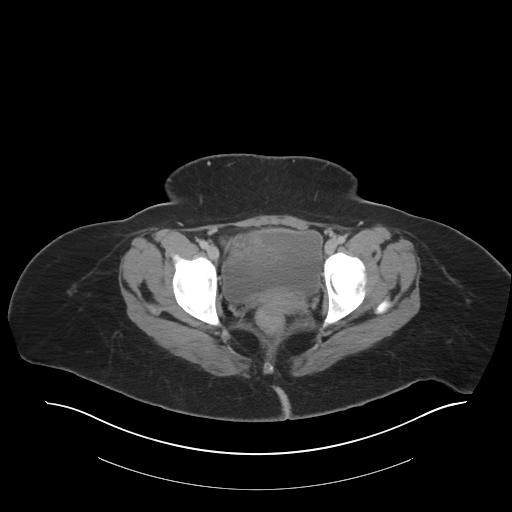
[im 23/85  soft-tissue]
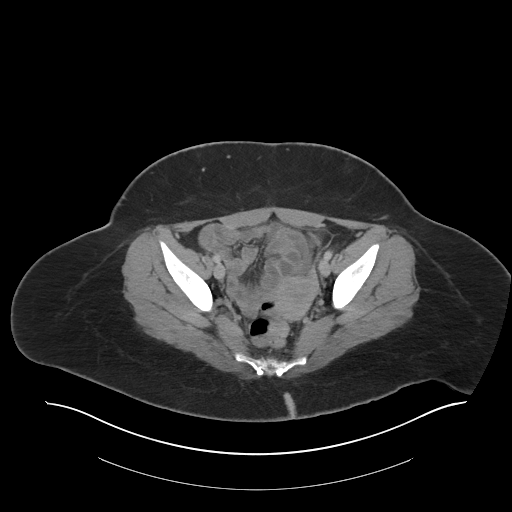
[im 30/85  soft-tissue]
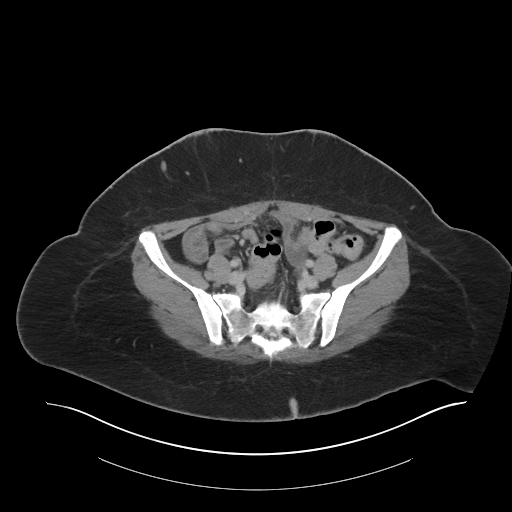
[im 36/85  soft-tissue]
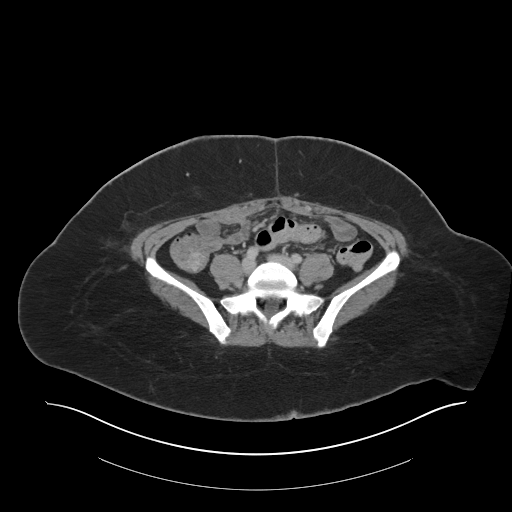
[im 43/85  soft-tissue]
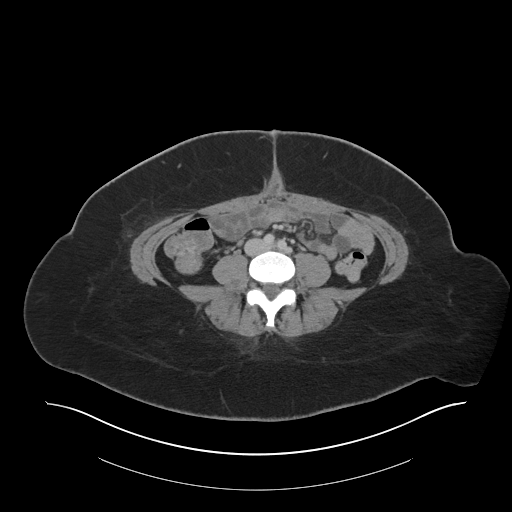
[im 49/85  soft-tissue]
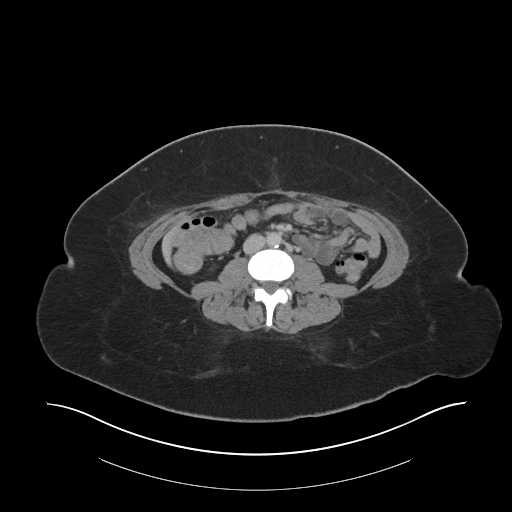
[im 55/85  soft-tissue]
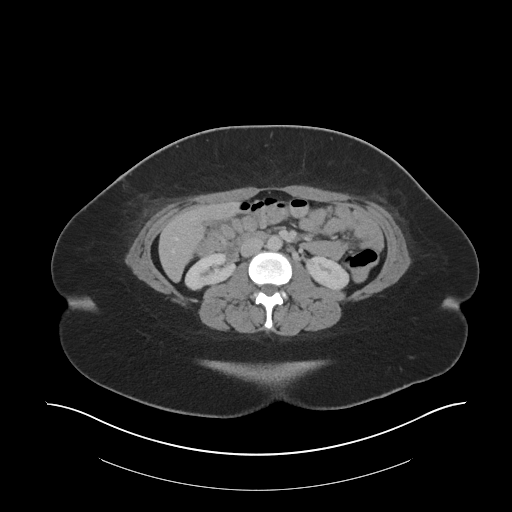
[im 55/85  bone]
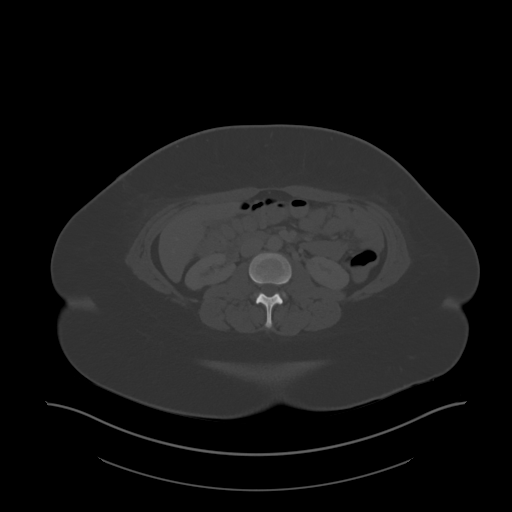
[im 62/85  soft-tissue]
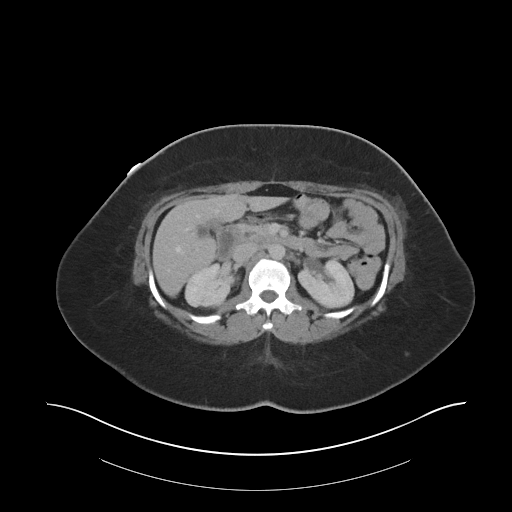
[im 68/85  soft-tissue]
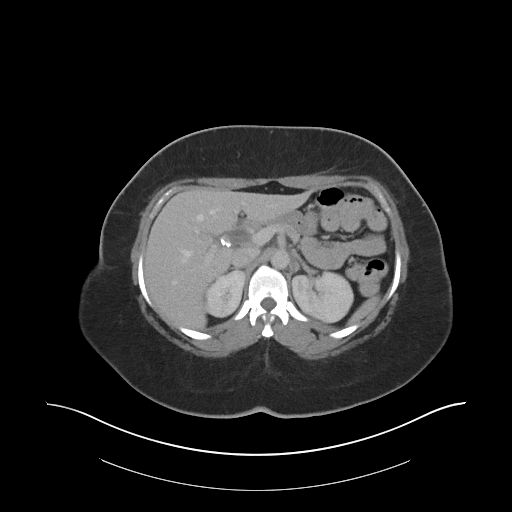
[im 75/85  soft-tissue]
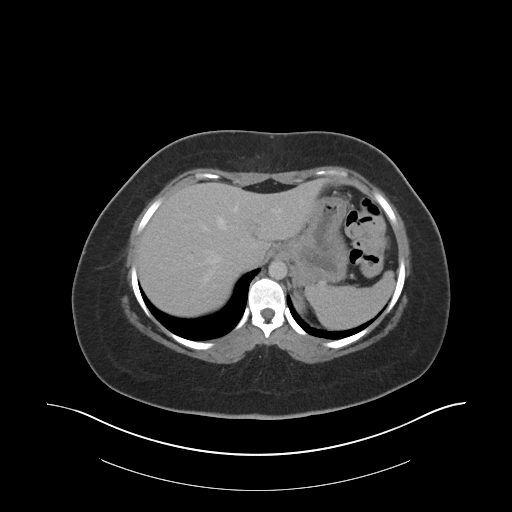
[im 81/85  soft-tissue]
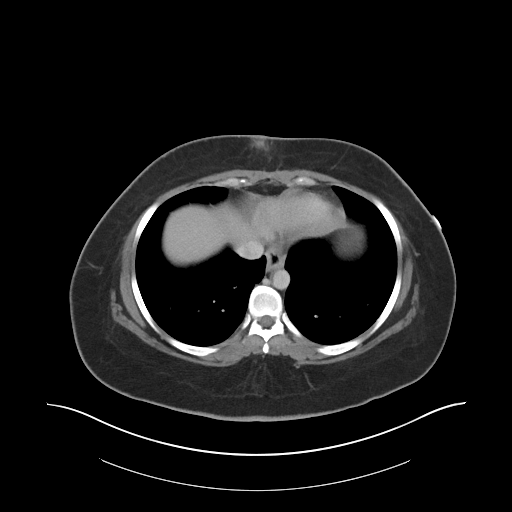

[Series 5: coronal soft tissue · coronal · 0.87mm/px · 3 of 101 slices shown]
[im 34/101  soft-tissue]
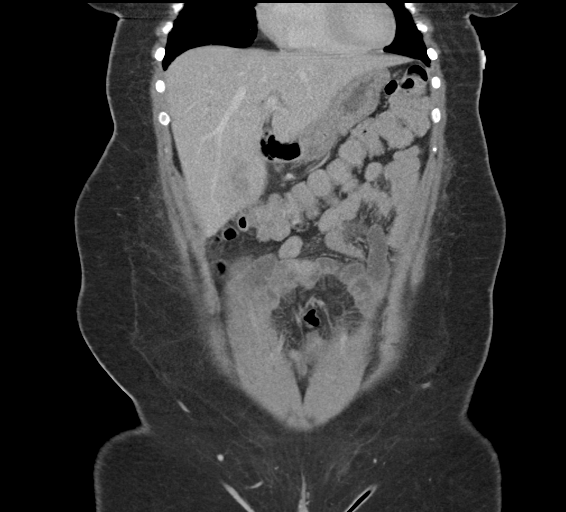
[im 45/101  soft-tissue]
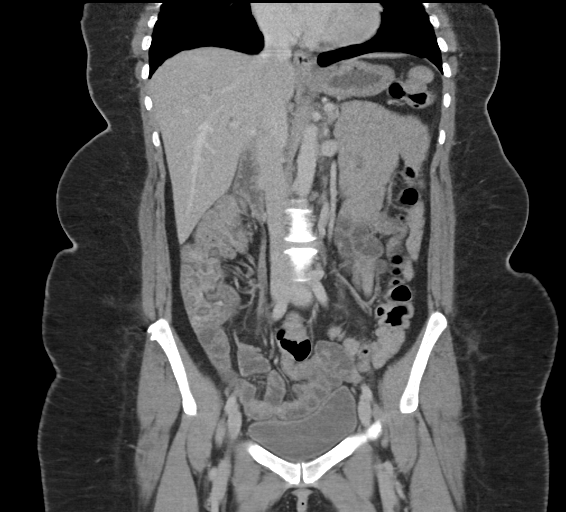
[im 56/101  soft-tissue]
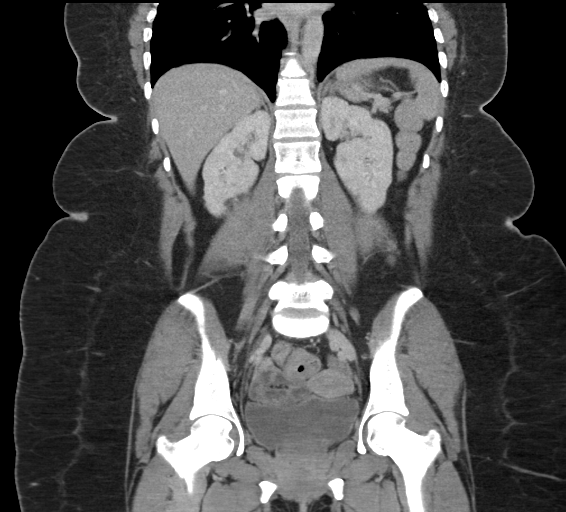

[16 of 46 positions shown; findings below may reference images not displayed]

FINDINGS: Lower chest: Lung bases are free of acute infiltrate or sizable
effusion.

Hepatobiliary: Liver is within normal limits. A small amount of
fluid is noted within the surgical bed measuring 17 x 17 mm slightly
more prominent than that seen on the prior exam. No findings to
suggest superimposed infection are noted at this time. Common bile
duct and biliary tree is mildly prominent consistent with the post
cholecystectomy state.

Pancreas: Unremarkable. No pancreatic ductal dilatation or
surrounding inflammatory changes.

Spleen: Normal in size without focal abnormality.

Adrenals/Urinary Tract: Adrenal glands are unremarkable. Kidneys are
normal, without renal calculi, focal lesion, or hydronephrosis.
Bladder is unremarkable.

Stomach/Bowel: Colon shows no obstructive or inflammatory changes.
The appendix is not well visualized. No inflammatory changes to
suggest appendicitis are noted. Small bowel and stomach are within
normal limits.

Vascular/Lymphatic: Aortic atherosclerosis. No enlarged abdominal or
pelvic lymph nodes.

Reproductive: Uterus and bilateral adnexa are unremarkable.

Other: No abdominal wall hernia or abnormality. No abdominopelvic
ascites.

Musculoskeletal: No acute or significant osseous findings.
IMPRESSION: Minimal fluid collection in the gallbladder fossa consistent with
the recent cholecystectomy. The collection is slightly more
prominent than that seen on the prior exam. No findings of
superimposed infection are noted. This may cause some mild localized
discomfort however.

No other focal abnormality is noted.

## 2021-01-13 ENCOUNTER — Other Ambulatory Visit: Payer: Self-pay

## 2021-01-16 ENCOUNTER — Other Ambulatory Visit: Payer: Self-pay | Admitting: Psychiatry

## 2021-01-16 MED ORDER — AMPHETAMINE-DEXTROAMPHETAMINE 30 MG PO TABS: 30.0000 mg | ORAL_TABLET | Freq: Every day | ORAL | 0 refills | Status: DC

## 2021-01-16 NOTE — Progress Notes (Signed)
Patient and her provider Edison Pace, NP decided to increase Adderall XR to 30 mg in the morning and add Adderall IR 30 mg in the afternoon.  Prescription sent

## 2021-01-22 ENCOUNTER — Telehealth: Payer: Self-pay | Admitting: Adult Health

## 2021-01-22 ENCOUNTER — Encounter (INDEPENDENT_AMBULATORY_CARE_PROVIDER_SITE_OTHER): Payer: BC Managed Care – PPO | Admitting: Ophthalmology

## 2021-01-22 ENCOUNTER — Other Ambulatory Visit: Payer: Self-pay

## 2021-01-22 DIAGNOSIS — E103593 Type 1 diabetes mellitus with proliferative diabetic retinopathy without macular edema, bilateral: Secondary | ICD-10-CM | POA: Diagnosis not present

## 2021-01-22 DIAGNOSIS — H43812 Vitreous degeneration, left eye: Secondary | ICD-10-CM | POA: Diagnosis not present

## 2021-01-22 DIAGNOSIS — H35372 Puckering of macula, left eye: Secondary | ICD-10-CM | POA: Diagnosis not present

## 2021-01-22 NOTE — Telephone Encounter (Signed)
Pt called and said  that her pharmacy doesn't have her adderall 30 mg in stock. The pharmacy would like you to send in  a new script and cancel the 30 mg . The new script needs to be addderall 20 mg and take 1 And 1/2 tabs. [facility] [address]

## 2021-01-22 NOTE — Telephone Encounter (Signed)
Please review

## 2021-01-22 NOTE — Telephone Encounter (Signed)
Ok to pend.

## 2021-01-23 ENCOUNTER — Other Ambulatory Visit: Payer: Self-pay

## 2021-01-23 MED ORDER — AMPHETAMINE-DEXTROAMPHETAMINE 20 MG PO TABS: ORAL_TABLET | ORAL | 0 refills | Status: DC

## 2021-01-23 NOTE — Telephone Encounter (Signed)
Pended.

## 2021-02-04 ENCOUNTER — Encounter: Payer: Self-pay | Admitting: Adult Health

## 2021-02-04 ENCOUNTER — Telehealth (INDEPENDENT_AMBULATORY_CARE_PROVIDER_SITE_OTHER): Payer: BC Managed Care – PPO | Admitting: Adult Health

## 2021-02-04 DIAGNOSIS — F411 Generalized anxiety disorder: Secondary | ICD-10-CM | POA: Diagnosis not present

## 2021-02-04 DIAGNOSIS — F410 Panic disorder [episodic paroxysmal anxiety] without agoraphobia: Secondary | ICD-10-CM | POA: Diagnosis not present

## 2021-02-04 DIAGNOSIS — F988 Other specified behavioral and emotional disorders with onset usually occurring in childhood and adolescence: Secondary | ICD-10-CM

## 2021-02-04 DIAGNOSIS — F331 Major depressive disorder, recurrent, moderate: Secondary | ICD-10-CM | POA: Diagnosis not present

## 2021-02-04 DIAGNOSIS — G4700 Insomnia, unspecified: Secondary | ICD-10-CM

## 2021-02-04 MED ORDER — AMPHETAMINE-DEXTROAMPHET ER 30 MG PO CP24: 30.0000 mg | ORAL_CAPSULE | Freq: Two times a day (BID) | ORAL | 0 refills | Status: DC

## 2021-02-04 NOTE — Progress Notes (Signed)
Theresa [name] [id number] January 01, 1991 30 y.o.  Virtual Visit via Video Note  I connected with pt @ on 02/04/21 at  3:20 PM EST by a video enabled telemedicine application and verified that I am speaking with the correct person using two identifiers.   I discussed the limitations of evaluation and management by telemedicine and the availability of in person appointments. The patient expressed understanding and agreed to proceed.  I discussed the assessment and treatment plan with the patient. The patient was provided an opportunity to ask questions and all were answered. The patient agreed with the plan and demonstrated an understanding of the instructions.   The patient was advised to call back or seek an in-person evaluation if the symptoms worsen or if the condition fails to improve as anticipated.  I provided 25 minutes of non-face-to-face time during this encounter.  The patient was located at home.  The provider was located at [place].   Aloha Gell, NP   Subjective:   Patient ID:  Theresa [name] is a 30 y.o. (DOB 26-Nov-1990) female.  Chief Complaint: No chief complaint on file.   HPI Theresa [name] presents for follow-up of panic attacks, anxiety, depression, ADHD, and insomnia.   Describes mood today as "better". Pleasant. Decreased tearfulness. Mood symptoms - reports some depression and anxiety. Denies irritability. Denies panic attacks. Reports increased worry and rumination. Feels like medications are helpful. Did not tolerate Adderall IR - no issues with the Adderall XR. Aunt recovering from fall - helping her out. Decreased interest and motivation. Taking other medications as prescribed.  Energy levels lower - feels fatigued. Active, does not have a regular exercise routine.   Enjoys some usual interests and activities. Single. Lives with aunt and dog - "[place]".Family local.   Appetite improved. Weight stable - 200 pounds. Sleeps better some  nights than others. Averages "either too much or not enough". Difficulties with focus and concentration - Adderall IR not working. Completing tasks. 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  Denies AH or VH.  Previous medications: Adderall IR causes GERD.   Review of Systems:  Review of Systems  Musculoskeletal:  Negative for gait problem.  Neurological:  Negative for tremors.  Psychiatric/Behavioral:         Please refer to HPI   Medications: I have reviewed the patient's current medications.  Current Outpatient Medications  Medication Sig Dispense Refill   amphetamine-dextroamphetamine (ADDERALL XR) 30 MG 24 hr capsule Take 1 capsule (30 mg total) by mouth daily. 30 capsule 0   amphetamine-dextroamphetamine (ADDERALL) 20 MG tablet TAKE 1.5 TABS DAILY 30 tablet 0   amphetamine-dextroamphetamine (ADDERALL) 30 MG tablet Take 1 tablet by mouth daily in the afternoon. 30 tablet 0   B-COMPLEX-C PO Take 1 capsule by mouth daily.      buPROPion (WELLBUTRIN XL) 300 MG 24 hr tablet Take 1 tablet (300 mg total) by mouth daily. 30 tablet 5   cholecalciferol (VITAMIN D3) 25 MCG (1000 UNIT) tablet Take 1,000 Units by mouth daily.     clonazePAM (KLONOPIN) 1 MG tablet Take 1 tablet (1 mg total) by mouth at bedtime. 30 tablet 2   CONTOUR NEXT TEST test strip 1 each by Other route See admin instructions. 7 times daily     dexlansoprazole (DEXILANT) 60 MG capsule Take 1 capsule by mouth daily.     diphenoxylate-atropine (LOMOTIL) 2.5-0.025 MG tablet Take 1 tablet by mouth 4 (four) times daily as needed.  escitalopram (LEXAPRO) 20 MG tablet Take one tablet (20 mg) by mouth daily 30 tablet 5   etonogestrel (NEXPLANON) 68 MG IMPL implant 1 each by Subdermal route continuous.      famotidine (PEPCID) 40 MG tablet Take 1 tablet by mouth daily.     fluconazole (DIFLUCAN) 200 MG tablet Take 200 mg by mouth every other day. (Patient not taking: Reported on 05/11/2020)      hydrOXYzine (ATARAX/VISTARIL) 10 MG tablet Take 10 mg by mouth every 8 (eight) hours as needed for itching.     insulin aspart (NOVOLOG) 100 UNIT/ML FlexPen Inject 5-20 Units into the skin See admin instructions. Sliding scale: If blood sugar over 150, takes 5 units. Maximum 20 units.     insulin degludec (TRESIBA) 100 UNIT/ML FlexTouch Pen Inject 50 Units into the skin daily.     lisdexamfetamine (VYVANSE) 70 MG capsule Take 1 capsule (70 mg total) by mouth every morning. 30 capsule 0   NARCAN 4 MG/0.1ML LIQD nasal spray kit Place 1 spray into the nose once. Overdose     ondansetron (ZOFRAN-ODT) 8 MG disintegrating tablet Take 8 mg by mouth 3 (three) times daily as needed.     OVER THE COUNTER MEDICATION Take 1 Dose by mouth daily as needed (nausea/vomiting).     Promethazine HCl 6.25 MG/5ML SOLN Take 5 mLs by mouth every 8 (eight) hours as needed (cough).     sucralfate (CARAFATE) 1 g tablet Take 1 g by mouth 4 (four) times daily.     Turmeric 500 MG CAPS Take 500 mg by mouth daily.      No current facility-administered medications for this visit.    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  Recommend follow-up Pap smear/HPV at next annual exam.   Nausea & vomiting 04/08/2020    Peripheral neuropathy    Seasonal allergies    Traction retinal detachment    vitreou hemorrhage right eye    Family History  Problem Relation Age of Onset   Diabetes Mother    Migraines Mother    Hypertension Mother    CVA Mother    Breast cancer Maternal Grandmother    Thyroid disease Maternal Grandmother    Cancer Maternal Grandfather     Social History   Socioeconomic History   Marital status: Single    Spouse name: Not on file   Number of children: Not on file   Years of education: Not on file   Highest education level: Not on file  Occupational History   Not on file  Tobacco Use   Smoking status: Never   Smokeless tobacco: Never  Vaping Use   Vaping Use: Never used  Substance and Sexual Activity  Alcohol use: No    Alcohol/week: 0.0 standard drinks   Drug use: No   Sexual activity: Yes    Birth control/protection: Implant    Comment: 1ST INTERCOURSE- 83, PARTNERS - 6 . Nexplanon 06/27/2018  Other Topics Concern   Not on file  Social History Narrative   Student.  Not married.  Lives alone in a one story home.   Not working.   Social Determinants of Health   Financial Resource Strain: Not on file  Food Insecurity: Not on file  Transportation Needs: Not on file  Physical Activity: Not on file  Stress: Not on file  Social Connections: Not on file  Intimate Partner Violence: Not on file    Past Medical History, Surgical history, Social history, and Family history were reviewed and updated as appropriate.   Please see review of systems for further details on the patient's review from today.   Objective:   Physical Exam:  There were no vitals taken for this visit.  Physical Exam Constitutional:      General: She is not in acute distress. Musculoskeletal:        General: No deformity.  Neurological:     Mental Status: She is alert and oriented to person, place, and time.     Coordination: Coordination normal.  Psychiatric:        Attention and  Perception: Attention and perception normal. She does not perceive auditory or visual hallucinations.        Mood and Affect: Mood normal. Mood is not anxious or depressed. Affect is not labile, blunt, angry or inappropriate.        Speech: Speech normal.        Behavior: Behavior normal.        Thought Content: Thought content normal. Thought content is not paranoid or delusional. Thought content does not include homicidal or suicidal ideation. Thought content does not include homicidal or suicidal plan.        Cognition and Memory: Cognition and memory normal.        Judgment: Judgment normal.     Comments: Insight intact    Lab Review:     Component Value Date/Time   NA 137 05/12/2020 1357   K 3.7 05/12/2020 1357   CL 106 05/12/2020 1357   CO2 22 05/12/2020 1357   GLUCOSE 121 (H) 05/12/2020 1357   BUN 7 05/12/2020 1357   CREATININE 0.78 05/12/2020 1357   CREATININE 0.55 12/25/2013 1455   CALCIUM 8.9 05/12/2020 1357   PROT 7.6 05/11/2020 1604   ALBUMIN 3.9 05/11/2020 1604   AST 14 (L) 05/11/2020 1604   ALT 13 05/11/2020 1604   ALKPHOS 87 05/11/2020 1604   BILITOT 1.3 (H) 05/11/2020 1604   GFRNONAA >60 05/12/2020 1357   GFRAA >60 07/05/2019 1254       Component Value Date/Time   WBC 9.0 05/11/2020 1604   RBC 3.86 (L) 05/11/2020 1604   HGB 12.6 05/11/2020 1753   HCT 37.0 05/11/2020 1753   PLT 457 (H) 05/11/2020 1604   MCV 95.6 05/11/2020 1604   MCV 97.7 (A) 05/24/2013 2021   MCH 30.6 05/11/2020 1604   MCHC 32.0 05/11/2020 1604   RDW 12.9 05/11/2020 1604   LYMPHSABS 3.4 10/26/2017 0607   MONOABS 0.9 10/26/2017 0607   EOSABS 0.1 10/26/2017 0607   BASOSABS 0.1 10/26/2017 0607    No results found for: POCLITH, LITHIUM   No results found for: PHENYTOIN, PHENOBARB, VALPROATE, CBMZ   .res Assessment: Plan:  Plan:  Clonazepam 0.77m daily as needed Lexapro 268mdaily Wellbutrin XL 30032maily - denies seizure history. Adderall XR 19m85mD   RTC 4  weeks  Discussed potential benefits, risks, and side effects of stimulants with patient to include increased heart rate, palpitations, insomnia, increased anxiety, increased irritability, or decreased appetite.  Instructed patient to contact office if experiencing any significant tolerability issues.   Discussed potential benefits, risk, and side effects of benzodiazepines to include potential risk of tolerance and dependence, as well as possible drowsiness.  Advised patient not to drive if experiencing drowsiness and to take lowest possible effective dose to minimize risk of dependence and tolerance.  There are no diagnoses linked to this encounter.   Please see After Visit Summary for patient specific instructions.  Future Appointments  Date Time Provider [place]17/2023  2:00 PM MattHayden Pedro TRE-TRE None    No orders of the defined types were placed in this encounter.     -------------------------------

## 2021-02-06 ENCOUNTER — Telehealth: Payer: Self-pay

## 2021-02-06 NOTE — Telephone Encounter (Signed)
Prior Authorization DENIED for 2 a day of AMPHETAMINE-DEXTROAMPHETAMINE ER30 MG with [employer]. They are reviewing again but 2/day is not FDA approved. If you have documentation from an article or journal that contradicts this please fax.

## 2021-02-09 NOTE — Telephone Encounter (Signed)
According to PMP, pt picked up #60 Adderall XR 30 mg on 12/03 and also picked up #30 Adderall IR 20 mg on 11/18. Both were paid with her commercial insurance.  Apparently her insurance did cover the bid dosing.

## 2021-02-09 NOTE — Telephone Encounter (Signed)
Noted. Please call patient to advise.

## 2021-02-09 NOTE — Telephone Encounter (Signed)
Great!

## 2021-02-15 NOTE — Telephone Encounter (Signed)
PA reviewed and approved now for #60/30 days AMPHETAMINE-DEXTROAMPHETAMINE ER 30 MG effective 02/06/2021-02/05/2022 with [employer]. Ref # [id number]

## 2021-03-06 ENCOUNTER — Encounter: Payer: Self-pay | Admitting: Adult Health

## 2021-03-06 ENCOUNTER — Telehealth (INDEPENDENT_AMBULATORY_CARE_PROVIDER_SITE_OTHER): Payer: BC Managed Care – PPO | Admitting: Adult Health

## 2021-03-06 DIAGNOSIS — F410 Panic disorder [episodic paroxysmal anxiety] without agoraphobia: Secondary | ICD-10-CM | POA: Diagnosis not present

## 2021-03-06 DIAGNOSIS — G4700 Insomnia, unspecified: Secondary | ICD-10-CM

## 2021-03-06 DIAGNOSIS — F411 Generalized anxiety disorder: Secondary | ICD-10-CM

## 2021-03-06 DIAGNOSIS — F988 Other specified behavioral and emotional disorders with onset usually occurring in childhood and adolescence: Secondary | ICD-10-CM | POA: Diagnosis not present

## 2021-03-06 DIAGNOSIS — F331 Major depressive disorder, recurrent, moderate: Secondary | ICD-10-CM

## 2021-03-06 MED ORDER — AMPHETAMINE-DEXTROAMPHET ER 30 MG PO CP24: 30.0000 mg | ORAL_CAPSULE | Freq: Two times a day (BID) | ORAL | 0 refills | Status: DC

## 2021-03-06 MED ORDER — CLONAZEPAM 1 MG PO TABS: 1.0000 mg | ORAL_TABLET | Freq: Every day | ORAL | 2 refills | Status: DC

## 2021-03-06 NOTE — Progress Notes (Signed)
Theresa [name] [id number] 02-13-1991 30 y.o.  Subjective:   Patient ID:  Theresa [name] is a 30 y.o. (DOB 05/27/1990) female.  Chief Complaint: No chief complaint on file.   HPI Theresa [name] presents to the office today for follow-up of panic attacks, anxiety, depression, ADHD, and insomnia.   Describes mood today as "better". Pleasant. Decreased tearfulness. Mood symptoms - reports some depression and anxiety. Denies irritability. Reports one "long extended" panic attack at Christmas. Reports increased worry and rumination - "over thinking". Stating "I feel like I'm at a plateau with the medications".  Feels like medications are helpful, but still not feeling the way she wants to. Varying interest and motivation. Taking other medications as prescribed.  Energy levels lower - GI and dietary issues. Active, does not have a regular exercise routine.   Enjoys some usual interests and activities. Single. Lives with aunt and dog - "[place]".Family local.   Appetite improved. Weight stable - 200 pounds. Sleeps better some nights than others. Either over sleeping or under sleeping - it comes and goes. Improved focus and concentration with switch to Adderall XR formula. Completing tasks. 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  Denies AH or VH.  Previous medications: Adderall IR causes GERD.   PHQ2-9    Flowsheet Row [facility] 08/24/2016 [facility] Nutrition from 07/20/2016 in [facility] Nutrition from 01/15/2014 in [facility]  PHQ-2 Total Score 0 0 0 0      Flowsheet Row ED to Hosp-Admission (Discharged) from 05/11/2020 in [facility] Most recent reading at 05/11/2020  3:54 PM ED to Hosp-Admission (Discharged) from 04/07/2020 in [place] Most recent reading at 04/07/2020  4:56 PM ED from 04/07/2020 in [facility] [place] Most recent reading at 04/07/2020  4:11 PM  C-SSRS RISK CATEGORY No Risk No Risk No Risk        Review of Systems:  Review of Systems  Musculoskeletal:  Negative for gait problem.  Neurological:  Negative for tremors.  Psychiatric/Behavioral:         Please refer to HPI   Medications: I have reviewed the patient's current medications.  Current Outpatient Medications  Medication Sig Dispense Refill   amphetamine-dextroamphetamine (ADDERALL XR) 30 MG 24 hr capsule Take 1 capsule (30 mg total) by mouth 2 (two) times daily. 30 capsule 0   B-COMPLEX-C PO Take 1 capsule by mouth daily.      buPROPion (WELLBUTRIN XL) 300 MG 24 hr tablet Take 1 tablet (300 mg total) by mouth daily. 30 tablet 5   cholecalciferol (VITAMIN D3) 25 MCG (1000 UNIT) tablet Take 1,000 Units by mouth daily.     clonazePAM (KLONOPIN) 1 MG tablet Take 1 tablet (1 mg total) by mouth at bedtime. 30 tablet 2   CONTOUR NEXT TEST test strip 1 each by Other route See admin instructions. 7 times daily     dexlansoprazole (DEXILANT) 60 MG capsule Take 1 capsule by mouth daily.     diphenoxylate-atropine (LOMOTIL) 2.5-0.025 MG tablet Take 1 tablet by mouth 4 (four) times daily as needed.     escitalopram (LEXAPRO) 20 MG tablet Take one tablet (20 mg) by mouth daily 30 tablet 5   etonogestrel (NEXPLANON) 68 MG IMPL implant 1 each by Subdermal route continuous.  famotidine (PEPCID) 40 MG tablet Take 1 tablet by mouth daily.     fluconazole (DIFLUCAN) 200 MG tablet Take 200 mg by mouth every other day. (Patient not taking: Reported on 05/11/2020)     hydrOXYzine (ATARAX/VISTARIL) 10 MG tablet Take 10 mg by mouth every 8 (eight) hours as needed for itching.     insulin aspart (NOVOLOG) 100 UNIT/ML FlexPen Inject 5-20 Units into the skin See admin instructions. Sliding scale: If blood sugar over 150, takes 5 units. Maximum  20 units.     insulin degludec (TRESIBA) 100 UNIT/ML FlexTouch Pen Inject 50 Units into the skin daily.     NARCAN 4 MG/0.1ML LIQD nasal spray kit Place 1 spray into the nose once. Overdose     ondansetron (ZOFRAN-ODT) 8 MG disintegrating tablet Take 8 mg by mouth 3 (three) times daily as needed.     OVER THE COUNTER MEDICATION Take 1 Dose by mouth daily as needed (nausea/vomiting).     Promethazine HCl 6.25 MG/5ML SOLN Take 5 mLs by mouth every 8 (eight) hours as needed (cough).     sucralfate (CARAFATE) 1 g tablet Take 1 g by mouth 4 (four) times daily.     Turmeric 500 MG CAPS Take 500 mg by mouth daily.      No current facility-administered medications for this visit.    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  Recommend follow-up Pap smear/HPV at next annual exam.   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    Past Medical History, Surgical history, Social history, and Family history were reviewed and updated as appropriate.   Please see review of systems for further details on the patient's review from today.   Objective:   Physical Exam:  There were no vitals taken for this  visit.  Physical Exam Constitutional:      General: She is not in acute distress. Musculoskeletal:        General: No deformity.  Neurological:     Mental Status: She is alert and oriented to person, place, and time.     Coordination: Coordination normal.  Psychiatric:        Attention and Perception: Attention and perception normal. She does not perceive auditory or visual hallucinations.        Mood and Affect: Mood normal. Mood is not anxious or depressed. Affect is not labile, blunt, angry or inappropriate.        Speech: Speech normal.        Behavior: Behavior normal.        Thought Content: Thought content normal. Thought content  is not paranoid or delusional. Thought content does not include homicidal or suicidal ideation. Thought content does not include homicidal or suicidal plan.        Cognition and Memory: Cognition and memory normal.        Judgment: Judgment normal.     Comments: Insight intact    Lab Review:     Component Value Date/Time   NA 137 05/12/2020 1357   K 3.7 05/12/2020 1357   CL 106 05/12/2020 1357   CO2 22 05/12/2020 1357   GLUCOSE 121 (H) 05/12/2020 1357   BUN 7 05/12/2020 1357   CREATININE 0.78 05/12/2020 1357   CREATININE 0.55 12/25/2013 1455   CALCIUM 8.9 05/12/2020 1357   PROT 7.6 05/11/2020 1604   ALBUMIN 3.9 05/11/2020 1604   AST 14 (L) 05/11/2020 1604   ALT 13 05/11/2020 1604   ALKPHOS 87 05/11/2020 1604   BILITOT 1.3 (H) 05/11/2020 1604   GFRNONAA >60 05/12/2020 1357   GFRAA >60 07/05/2019 1254       Component Value Date/Time   WBC 9.0 05/11/2020 1604   RBC 3.86 (L) 05/11/2020 1604   HGB 12.6 05/11/2020 1753   HCT 37.0 05/11/2020 1753   PLT 457 (H) 05/11/2020 1604   MCV 95.6 05/11/2020 1604   MCV 97.7 (A) 05/24/2013 2021   MCH 30.6 05/11/2020 1604   MCHC 32.0 05/11/2020 1604   RDW 12.9 05/11/2020 1604   LYMPHSABS 3.4 10/26/2017 0607   MONOABS 0.9 10/26/2017 0607   EOSABS 0.1 10/26/2017 0607   BASOSABS 0.1 10/26/2017 0607     No results found for: POCLITH, LITHIUM   No results found for: PHENYTOIN, PHENOBARB, VALPROATE, CBMZ   .res Assessment: Plan:    Plan:  Clonazepam 0.59m daily as needed Lexapro 258mdaily Wellbutrin XL 30052maily - denies seizure history. Adderall XR 4m74mD   RTC 4 weeks  Discussed potential benefits, risks, and side effects of stimulants with patient to include increased heart rate, palpitations, insomnia, increased anxiety, increased irritability, or decreased appetite.  Instructed patient to contact office if experiencing any significant tolerability issues.   Discussed potential benefits, risk, and side effects of benzodiazepines to include potential risk of tolerance and dependence, as well as possible drowsiness.  Advised patient not to drive if experiencing drowsiness and to take lowest possible effective dose to minimize risk of dependence and tolerance.  There are no diagnoses linked to this encounter.   Please see After Visit Summary for patient specific instructions.  Future Appointments  Date Time Provider Depa[place]/30/2022  4:00 PM Pricila Bridge, RegiBerdie Ogren CP-CP None  07/22/2021  2:00 PM MattHayden Pedro TRE-TRE None    No orders of the defined types were placed in this encounter.   -------------------------------

## 2021-03-16 ENCOUNTER — Other Ambulatory Visit: Payer: Self-pay

## 2021-03-16 ENCOUNTER — Telehealth: Payer: Self-pay | Admitting: Adult Health

## 2021-03-16 DIAGNOSIS — F410 Panic disorder [episodic paroxysmal anxiety] without agoraphobia: Secondary | ICD-10-CM

## 2021-03-16 MED ORDER — AMPHETAMINE-DEXTROAMPHET ER 30 MG PO CP24: 30.0000 mg | ORAL_CAPSULE | Freq: Two times a day (BID) | ORAL | 0 refills | Status: DC

## 2021-03-16 NOTE — Telephone Encounter (Signed)
Pt called and said that the adderall xr that was sent on 12/30 pharrmacy doesn't have it. Please cancel and send to [facility] located at [address]

## 2021-03-16 NOTE — Telephone Encounter (Signed)
Pended.

## 2021-04-20 ENCOUNTER — Ambulatory Visit: Payer: BC Managed Care – PPO | Admitting: Adult Health

## 2021-04-20 NOTE — Progress Notes (Signed)
Patient no show appointment. ? ?

## 2021-04-23 ENCOUNTER — Telehealth: Payer: Self-pay | Admitting: Adult Health

## 2021-04-23 NOTE — Telephone Encounter (Signed)
Pt requesting Rx for Adderall XR 30 mg 2/d  [facility] [address] A[address] 2/22.

## 2021-04-23 NOTE — Telephone Encounter (Signed)
Patient returned call and she had not checked availability on the Adderall. I told her to let us know where to send Rx when she finds it.

## 2021-04-23 NOTE — Telephone Encounter (Signed)
Left VM to [facility]  - see if she has verified availability.

## 2021-04-24 ENCOUNTER — Other Ambulatory Visit: Payer: Self-pay

## 2021-04-24 DIAGNOSIS — F410 Panic disorder [episodic paroxysmal anxiety] without agoraphobia: Secondary | ICD-10-CM

## 2021-04-24 MED ORDER — AMPHETAMINE-DEXTROAMPHET ER 30 MG PO CP24: 30.0000 mg | ORAL_CAPSULE | Freq: Two times a day (BID) | ORAL | 0 refills | Status: DC

## 2021-04-24 NOTE — Telephone Encounter (Signed)
Patient called with pharmacy information. Rx pended for [facility].

## 2021-04-29 ENCOUNTER — Ambulatory Visit: Payer: BC Managed Care – PPO | Admitting: Adult Health

## 2021-04-29 NOTE — Progress Notes (Signed)
Patient no show appointment. ? ?

## 2021-07-13 ENCOUNTER — Telehealth: Payer: Self-pay | Admitting: Adult Health

## 2021-07-13 NOTE — Telephone Encounter (Signed)
Theresa [name] called wanting to talk with the nurse about her Adderall XR.  She indicated that she has helped her before.  She is having trouble filling this prescription.  Please call. ?

## 2021-07-14 NOTE — Telephone Encounter (Signed)
LVM to [facility] 

## 2021-07-15 NOTE — Telephone Encounter (Signed)
Left another VM 

## 2021-07-17 ENCOUNTER — Other Ambulatory Visit: Payer: Self-pay

## 2021-07-17 DIAGNOSIS — F988 Other specified behavioral and emotional disorders with onset usually occurring in childhood and adolescence: Secondary | ICD-10-CM

## 2021-07-17 DIAGNOSIS — F411 Generalized anxiety disorder: Secondary | ICD-10-CM

## 2021-07-17 NOTE — Telephone Encounter (Signed)
Patient returned call. Asking for RF of Adderall and clonazepam. She did not look for a pharmacy for Adderall. Asked her to locate and let me know the pharmacy. She has an appt with Almira Coaster on Monday.  ?

## 2021-07-20 ENCOUNTER — Ambulatory Visit: Payer: BC Managed Care – PPO | Admitting: Adult Health

## 2021-07-20 NOTE — Telephone Encounter (Signed)
Pt called for refill of Clonazepam to  ? ?[facility] [id number]  ?[place], [place] [address]  ?Phone:  [phone]  Fax:  [phone]  ? ?Next appt 6/8 ? ?

## 2021-07-21 MED ORDER — CLONAZEPAM 1 MG PO TABS: 1.0000 mg | ORAL_TABLET | Freq: Every day | ORAL | 0 refills | Status: DC

## 2021-07-21 NOTE — Telephone Encounter (Signed)
Received pharmacy request for clonazepam. Have not heard back from patient about pharmacy for Adderall.  ?

## 2021-07-21 NOTE — Telephone Encounter (Signed)
Please send for [facility] ?

## 2021-07-22 ENCOUNTER — Encounter (INDEPENDENT_AMBULATORY_CARE_PROVIDER_SITE_OTHER): Payer: BC Managed Care – PPO | Admitting: Ophthalmology

## 2021-07-23 ENCOUNTER — Other Ambulatory Visit: Payer: Self-pay

## 2021-07-23 DIAGNOSIS — F410 Panic disorder [episodic paroxysmal anxiety] without agoraphobia: Secondary | ICD-10-CM

## 2021-07-23 MED ORDER — AMPHETAMINE-DEXTROAMPHET ER 30 MG PO CP24: 30.0000 mg | ORAL_CAPSULE | Freq: Two times a day (BID) | ORAL | 0 refills | Status: DC

## 2021-08-11 ENCOUNTER — Encounter (INDEPENDENT_AMBULATORY_CARE_PROVIDER_SITE_OTHER): Payer: BC Managed Care – PPO | Admitting: Ophthalmology

## 2021-08-13 ENCOUNTER — Ambulatory Visit (INDEPENDENT_AMBULATORY_CARE_PROVIDER_SITE_OTHER): Payer: Self-pay | Admitting: Adult Health

## 2021-08-13 DIAGNOSIS — Z91199 Patient's noncompliance with other medical treatment and regimen due to unspecified reason: Secondary | ICD-10-CM

## 2021-08-13 NOTE — Progress Notes (Signed)
Patient no show appointment. ? ?

## 2021-08-17 ENCOUNTER — Encounter (INDEPENDENT_AMBULATORY_CARE_PROVIDER_SITE_OTHER): Payer: BC Managed Care – PPO | Admitting: Ophthalmology

## 2021-08-18 ENCOUNTER — Telehealth (INDEPENDENT_AMBULATORY_CARE_PROVIDER_SITE_OTHER): Payer: BC Managed Care – PPO | Admitting: Adult Health

## 2021-08-18 ENCOUNTER — Encounter: Payer: Self-pay | Admitting: Adult Health

## 2021-08-18 DIAGNOSIS — F908 Attention-deficit hyperactivity disorder, other type: Secondary | ICD-10-CM | POA: Diagnosis not present

## 2021-08-18 DIAGNOSIS — F411 Generalized anxiety disorder: Secondary | ICD-10-CM

## 2021-08-18 DIAGNOSIS — F410 Panic disorder [episodic paroxysmal anxiety] without agoraphobia: Secondary | ICD-10-CM

## 2021-08-18 DIAGNOSIS — F988 Other specified behavioral and emotional disorders with onset usually occurring in childhood and adolescence: Secondary | ICD-10-CM

## 2021-08-18 DIAGNOSIS — F331 Major depressive disorder, recurrent, moderate: Secondary | ICD-10-CM | POA: Diagnosis not present

## 2021-08-18 MED ORDER — CLONAZEPAM 1 MG PO TABS: 1.0000 mg | ORAL_TABLET | Freq: Every day | ORAL | 2 refills | Status: DC

## 2021-08-18 MED ORDER — BUPROPION HCL ER (XL) 300 MG PO TB24: 300.0000 mg | ORAL_TABLET | Freq: Every day | ORAL | 5 refills | Status: DC

## 2021-08-18 MED ORDER — FLUOXETINE HCL 20 MG PO CAPS: 20.0000 mg | ORAL_CAPSULE | Freq: Every day | ORAL | 0 refills | Status: DC

## 2021-08-18 MED ORDER — AMPHETAMINE-DEXTROAMPHET ER 30 MG PO CP24: 30.0000 mg | ORAL_CAPSULE | Freq: Two times a day (BID) | ORAL | 0 refills | Status: DC

## 2021-08-18 NOTE — Progress Notes (Addendum)
Theresa [name] [id number] [birth year] 31 y.o.  Virtual Visit via Video Note  I connected with pt @ on 08/18/21 at 10:00 AM EDT by a video enabled telemedicine application and verified that I am speaking with the correct person using two identifiers.   I discussed the limitations of evaluation and management by telemedicine and the availability of in person appointments. The patient expressed understanding and agreed to proceed.  I discussed the assessment and treatment plan with the patient. The patient was provided an opportunity to ask questions and all were answered. The patient agreed with the plan and demonstrated an understanding of the instructions.   The patient was advised to call back or seek an in-person evaluation if the symptoms worsen or if the condition fails to improve as anticipated.  I provided 25 minutes of non-face-to-face time during this encounter.  The patient was located at home.  The provider was located at [place].   Theresa Gell, NP   Subjective:   Patient ID:  Theresa [name] is a 31 y.o. (DOB [birth year]) female.  Chief Complaint: No chief complaint on file.   HPI Theresa [name] presents for follow-up of panic attacks, anxiety, depression, ADHD, and insomnia.   Describes mood today as "not good". Pleasant. Decreased tearfulness. Mood symptoms - reports depression, anxiety and irritability. Increased anxiety - has been off of Lexapro because it was unavailable - and it wasn't helpful. Willing to consider Prozac to replace the Lexapro. Increased pain issues - not getting any relief which has taken a toll on everything else. Reports increased worry and rumination. Reports over thinking. Reports panic attacks - "pretty frequent and worse than they have been in the past". Stating "things haven't been going well at all". Has struggled without being able to take the Adderall - due to availability. Varying interest and motivation. Taking  other medications as prescribed.  Energy levels lower. Active, does not have a regular exercise routine.   Enjoys some usual interests and activities. Single. Lives with aunt and dog - "Theresa [name]". Family local.   Appetite improved. Weight stable - 200 pounds. Sleeps better some nights than others - "hurting". Averages 4 to 5 hours - "not consecutively". Improved focus and concentration with switch to Adderall XR formula. Completing tasks. Managing some aspects of household.  Denies SI or HI.  Denies AH or VH.  Previous medications: Adderall IR causes GERD.   Review of Systems:  Review of Systems  Musculoskeletal:  Negative for gait problem.  Neurological:  Negative for tremors.  Psychiatric/Behavioral:         Please refer to HPI    Medications: I have reviewed the patient's current medications.  Current Outpatient Medications  Medication Sig Dispense Refill   FLUoxetine (PROZAC) 20 MG capsule Take 1 capsule (20 mg total) by mouth daily. 30 capsule 0   amphetamine-dextroamphetamine (ADDERALL XR) 30 MG 24 hr capsule Take 1 capsule (30 mg total) by mouth 2 (two) times daily. 60 capsule 0   B-COMPLEX-C PO Take 1 capsule by mouth daily.      buPROPion (WELLBUTRIN XL) 300 MG 24 hr tablet Take 1 tablet (300 mg total) by mouth daily. 30 tablet 5   cholecalciferol (VITAMIN D3) 25 MCG (1000 UNIT) tablet Take 1,000 Units by mouth daily.     clonazePAM (KLONOPIN) 1 MG tablet Take 1 tablet (1 mg total) by mouth at bedtime. 30 tablet 2   CONTOUR NEXT TEST test strip 1 each by Other route See admin  instructions. 7 times daily     dexlansoprazole (DEXILANT) 60 MG capsule Take 1 capsule by mouth daily.     diphenoxylate-atropine (LOMOTIL) 2.5-0.025 MG tablet Take 1 tablet by mouth 4 (four) times daily as needed.     etonogestrel (NEXPLANON) 68 MG IMPL implant 1 each by Subdermal route continuous.      famotidine (PEPCID) 40 MG tablet Take 1 tablet by mouth daily.     fluconazole (DIFLUCAN) 200 MG  tablet Take 200 mg by mouth every other day. (Patient not taking: Reported on 05/11/2020)     hydrOXYzine (ATARAX/VISTARIL) 10 MG tablet Take 10 mg by mouth every 8 (eight) hours as needed for itching.     insulin aspart (NOVOLOG) 100 UNIT/ML FlexPen Inject 5-20 Units into the skin See admin instructions. Sliding scale: If blood sugar over 150, takes 5 units. Maximum 20 units.     insulin degludec (TRESIBA) 100 UNIT/ML FlexTouch Pen Inject 50 Units into the skin daily.     NARCAN 4 MG/0.1ML LIQD nasal spray kit Place 1 spray into the nose once. Overdose     ondansetron (ZOFRAN-ODT) 8 MG disintegrating tablet Take 8 mg by mouth 3 (three) times daily as needed.     OVER THE COUNTER MEDICATION Take 1 Dose by mouth daily as needed (nausea/vomiting).     Promethazine HCl 6.25 MG/5ML SOLN Take 5 mLs by mouth every 8 (eight) hours as needed (cough).     sucralfate (CARAFATE) 1 g tablet Take 1 g by mouth 4 (four) times daily.     Turmeric 500 MG CAPS Take 500 mg by mouth daily.      No current facility-administered medications for this visit.    Medication Side Effects: None  Allergies:  Allergies  Allergen Reactions   Tapentadol Swelling and Rash   Aloe Hives   Duloxetine Other (See Comments)    RESTLESSNESS, URGES TO MOVE   Gabapentin Swelling    SWELLING REACTION OF FEET    Olive Oil Hives   Pregabalin Other (See Comments)    Heart palpitations, Fainting, chest pain, sweats   Metoclopramide Other (See Comments)   Zonisamide Other (See Comments)    WORSENING PAIN    Past Medical History:  Diagnosis Date   Fibromyalgia    Fibromyalgia 06/28/2019   GERD (gastroesophageal reflux disease)    Headache    Insomnia    Insulin dependent type 1 diabetes mellitus ([address])    LGSIL of cervix of undetermined significance 10/2018   Colposcopy normal with ECC showing atypical fragments.  Recommend follow-up Pap smear/HPV at next annual exam.   Nausea & vomiting 04/08/2020   Peripheral neuropathy     Seasonal allergies    Traction retinal detachment    vitreou hemorrhage right eye    Family History  Problem Relation Age of Onset   Diabetes Mother    Migraines Mother    Hypertension Mother    CVA Mother    Breast cancer Maternal Grandmother    Thyroid disease Maternal Grandmother    Cancer Maternal Grandfather     Social History   Socioeconomic History   Marital status: Single    Spouse name: Not on file   Number of children: Not on file   Years of education: Not on file   Highest education level: Not on file  Occupational History   Not on file  Tobacco Use   Smoking status: Never   Smokeless tobacco: Never  Vaping Use   Vaping Use: Never used  Substance and Sexual Activity   Alcohol use: No    Alcohol/week: 0.0 standard drinks of alcohol   Drug use: No   Sexual activity: Yes    Birth control/protection: Implant    Comment: 1ST INTERCOURSE- 44, PARTNERS - 6 . Nexplanon 06/27/2018  Other Topics Concern   Not on file  Social History Narrative   Student.  Not married.  Lives alone in a one story home.   Not working.   Social Determinants of Health   Financial Resource Strain: Not on file  Food Insecurity: Not on file  Transportation Needs: Not on file  Physical Activity: Not on file  Stress: Not on file  Social Connections: Not on file  Intimate Partner Violence: Not on file    Past Medical History, Surgical history, Social history, and Family history were reviewed and updated as appropriate.   Please see review of systems for further details on the patient's review from today.   Objective:   Physical Exam:  There were no vitals taken for this visit.  Physical Exam Constitutional:      General: She is not in acute distress. Musculoskeletal:        General: No deformity.  Neurological:     Mental Status: She is alert and oriented to person, place, and time.     Coordination: Coordination normal.  Psychiatric:        Attention and Perception:  Attention and perception normal. She does not perceive auditory or visual hallucinations.        Mood and Affect: Mood normal. Mood is not anxious or depressed. Affect is not labile, blunt, angry or inappropriate.        Speech: Speech normal.        Behavior: Behavior normal.        Thought Content: Thought content normal. Thought content is not paranoid or delusional. Thought content does not include homicidal or suicidal ideation. Thought content does not include homicidal or suicidal plan.        Cognition and Memory: Cognition and memory normal.        Judgment: Judgment normal.     Comments: Insight intact     Lab Review:     Component Value Date/Time   NA 137 05/12/2020 1357   K 3.7 05/12/2020 1357   CL 106 05/12/2020 1357   CO2 22 05/12/2020 1357   GLUCOSE 121 (H) 05/12/2020 1357   BUN 7 05/12/2020 1357   CREATININE 0.78 05/12/2020 1357   CREATININE 0.55 12/25/2013 1455   CALCIUM 8.9 05/12/2020 1357   PROT 7.6 05/11/2020 1604   ALBUMIN 3.9 05/11/2020 1604   AST 14 (L) 05/11/2020 1604   ALT 13 05/11/2020 1604   ALKPHOS 87 05/11/2020 1604   BILITOT 1.3 (H) 05/11/2020 1604   GFRNONAA >60 05/12/2020 1357   GFRAA >60 07/05/2019 1254       Component Value Date/Time   WBC 9.0 05/11/2020 1604   RBC 3.86 (L) 05/11/2020 1604   HGB 12.6 05/11/2020 1753   HCT 37.0 05/11/2020 1753   PLT 457 (H) 05/11/2020 1604   MCV 95.6 05/11/2020 1604   MCV 97.7 (A) 05/24/2013 2021   MCH 30.6 05/11/2020 1604   MCHC 32.0 05/11/2020 1604   RDW 12.9 05/11/2020 1604   LYMPHSABS 3.4 10/26/2017 0607   MONOABS 0.9 10/26/2017 0607   EOSABS 0.1 10/26/2017 0607   BASOSABS 0.1 10/26/2017 0607    No results found for: "POCLITH", "LITHIUM"   No results found for: "PHENYTOIN", "  PHENOBARB", "VALPROATE", "CBMZ"   .res Assessment: Plan:    Plan:  D/C Lexapro 39m daily -patient out and then didn't restart.  Add Prozac 224mdaily  Wellbutrin XL 30029maily - denies seizure history. Adderall  XR 31m21mD Clonazepam 1mg 79mly as needed   RTC 4 weeks  Discussed potential benefits, risks, and side effects of stimulants with patient to include increased heart rate, palpitations, insomnia, increased anxiety, increased irritability, or decreased appetite.  Instructed patient to contact office if experiencing any significant tolerability issues.   Discussed potential benefits, risk, and side effects of benzodiazepines to include potential risk of tolerance and dependence, as well as possible drowsiness.  Advised patient not to drive if experiencing drowsiness and to take lowest possible effective dose to minimize risk of dependence and tolerance. Diagnoses and all orders for this visit:  Panic attacks -     amphetamine-dextroamphetamine (ADDERALL XR) 30 MG 24 hr capsule; Take 1 capsule (30 mg total) by mouth 2 (two) times daily.  Generalized anxiety disorder -     buPROPion (WELLBUTRIN XL) 300 MG 24 hr tablet; Take 1 tablet (300 mg total) by mouth daily. -     clonazePAM (KLONOPIN) 1 MG tablet; Take 1 tablet (1 mg total) by mouth at bedtime.  Major depressive disorder, recurrent episode, moderate (HCC) -     buPROPion (WELLBUTRIN XL) 300 MG 24 hr tablet; Take 1 tablet (300 mg total) by mouth daily. -     FLUoxetine (PROZAC) 20 MG capsule; Take 1 capsule (20 mg total) by mouth daily.  Attention deficit hyperactivity disorder (ADHD), other type -     buPROPion (WELLBUTRIN XL) 300 MG 24 hr tablet; Take 1 tablet (300 mg total) by mouth daily.  Attention deficit disorder, unspecified hyperactivity presence -     clonazePAM (KLONOPIN) 1 MG tablet; Take 1 tablet (1 mg total) by mouth at bedtime.     Please see After Visit Summary for patient specific instructions.  No future appointments.   No orders of the defined types were placed in this encounter.     -------------------------------

## 2021-09-04 ENCOUNTER — Other Ambulatory Visit: Payer: Self-pay

## 2021-09-04 DIAGNOSIS — F410 Panic disorder [episodic paroxysmal anxiety] without agoraphobia: Secondary | ICD-10-CM

## 2021-09-04 MED ORDER — AMPHETAMINE-DEXTROAMPHET ER 30 MG PO CP24: 30.0000 mg | ORAL_CAPSULE | Freq: Two times a day (BID) | ORAL | 0 refills | Status: DC

## 2021-09-15 ENCOUNTER — Encounter: Payer: Self-pay | Admitting: Adult Health

## 2021-09-15 ENCOUNTER — Ambulatory Visit (INDEPENDENT_AMBULATORY_CARE_PROVIDER_SITE_OTHER): Payer: BC Managed Care – PPO | Admitting: Adult Health

## 2021-09-15 DIAGNOSIS — F988 Other specified behavioral and emotional disorders with onset usually occurring in childhood and adolescence: Secondary | ICD-10-CM | POA: Diagnosis not present

## 2021-09-15 DIAGNOSIS — F331 Major depressive disorder, recurrent, moderate: Secondary | ICD-10-CM

## 2021-09-15 DIAGNOSIS — F410 Panic disorder [episodic paroxysmal anxiety] without agoraphobia: Secondary | ICD-10-CM

## 2021-09-15 DIAGNOSIS — F411 Generalized anxiety disorder: Secondary | ICD-10-CM | POA: Diagnosis not present

## 2021-09-15 MED ORDER — AMPHETAMINE-DEXTROAMPHET ER 30 MG PO CP24
30.0000 mg | ORAL_CAPSULE | Freq: Two times a day (BID) | ORAL | 0 refills | Status: DC
Start: 2021-09-15 — End: 2021-11-10

## 2021-09-15 MED ORDER — CLONAZEPAM 1 MG PO TABS
1.0000 mg | ORAL_TABLET | Freq: Two times a day (BID) | ORAL | 2 refills | Status: DC | PRN
Start: 2021-09-15 — End: 2021-12-08

## 2021-09-15 MED ORDER — FLUOXETINE HCL 10 MG PO CAPS: ORAL_CAPSULE | ORAL | 2 refills | Status: DC

## 2021-09-15 NOTE — Progress Notes (Signed)
Theresa [name] [id number] 1990/07/26 31 y.o.  Subjective:   Patient ID:  Theresa [name] is a 31 y.o. (DOB 09/02/1990) female.  Chief Complaint: No chief complaint on file.   HPI Theresa [name] presents to the office today for follow-up of panic attacks, anxiety, depression, ADHD, and insomnia.   Describes mood today as "not good". Pleasant. Decreased tearfulness. Mood symptoms - reports depression, anxiety and irritability. Reports increased worry and rumination. Reports over thinking. Reports panic attacks. Feels frustrated with current circumstances. Has transitioned onto the Prozac at 76m daily - tolerating, but not a lot of noticeable difference yet. 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 Stating "I don't know what I'm going to do". Increased pain issues without medication. Varying interest and motivation. Taking other medications as prescribed.  Energy levels lower. Active, does not have a regular exercise routine.   Enjoys some usual interests and activities. Single. Lives with aunt and dog - "[place]. Family local.   Appetite varies. Weight stable - 200 pounds. Sleeps better some nights than others. Averages 4 to 5 hours or sleeps the entire day. Improved focus and concentration improved - taking Adderall XR formula. Completing tasks. Managing some aspects of household.  Denies SI or HI.  Denies AH or VH.  Previous medications: Adderall IR causes GERD.    PHQ2-9    Flowsheet Row [facility] Nutrition from 08/24/2016 [facility] Nutrition from 07/20/2016 [facility] Nutrition from 01/15/2014 in [facility]  PHQ-2 Total Score 0 0 0 0      Flowsheet Row ED to [facility] Most recent reading at 05/11/2020  3:54 PM ED to  Hosp-Admission (Discharged) [facility]07/2020 [facility] at 04/07/2020  4:56 PM [facility] at 04/07/2020  4:11 PM  C-SSRS RISK CATEGORY No Risk No Risk No Risk        Review of Systems:  Review of Systems  Musculoskeletal:  Negative for gait problem.  Neurological:  Negative for tremors.  Psychiatric/Behavioral:         Please refer to HPI    Medications: I have reviewed the patient's current medications.  Current Outpatient Medications  Medication Sig Dispense Refill   amphetamine-dextroamphetamine (ADDERALL XR) 30 MG 24 hr capsule Take 1 capsule (30 mg total) by mouth 2 (two) times daily. 60 capsule 0   B-COMPLEX-C PO Take 1 capsule by mouth daily.      buPROPion (WELLBUTRIN XL) 300 MG 24 hr tablet Take 1 tablet (300 mg total) by mouth daily. 30 tablet 5   cholecalciferol (VITAMIN D3) 25 MCG (1000 UNIT) tablet Take 1,000 Units by mouth daily.     clonazePAM (KLONOPIN) 1 MG tablet Take 1 tablet (1 mg total) by mouth at bedtime. 30 tablet 2   CONTOUR NEXT TEST test strip 1 each by Other route See admin instructions. 7 times daily     dexlansoprazole (DEXILANT) 60 MG capsule Take 1 capsule by mouth daily.     diphenoxylate-atropine (LOMOTIL) 2.5-0.025 MG tablet Take 1 tablet by mouth 4 (four) times daily as needed.     etonogestrel (NEXPLANON) 68 MG IMPL implant 1 each by Subdermal route continuous.      famotidine (PEPCID) 40 MG tablet Take  1 tablet by mouth daily.     fluconazole (DIFLUCAN) 200 MG tablet Take 200 mg by mouth every other day. (Patient not taking: Reported on 05/11/2020)     FLUoxetine (PROZAC) 20 MG capsule Take 1 capsule (20 mg total) by mouth daily. 30 capsule 0   hydrOXYzine (ATARAX/VISTARIL) 10 MG tablet Take 10 mg by mouth every 8 (eight) hours as needed for itching.     insulin aspart (NOVOLOG) 100 UNIT/ML FlexPen Inject 5-20 Units into the  skin See admin instructions. Sliding scale: If blood sugar over 150, takes 5 units. Maximum 20 units.     insulin degludec (TRESIBA) 100 UNIT/ML FlexTouch Pen Inject 50 Units into the skin daily.     NARCAN 4 MG/0.1ML LIQD nasal spray kit Place 1 spray into the nose once. Overdose     ondansetron (ZOFRAN-ODT) 8 MG disintegrating tablet Take 8 mg by mouth 3 (three) times daily as needed.     OVER THE COUNTER MEDICATION Take 1 Dose by mouth daily as needed (nausea/vomiting).     Promethazine HCl 6.25 MG/5ML SOLN Take 5 mLs by mouth every 8 (eight) hours as needed (cough).     sucralfate (CARAFATE) 1 g tablet Take 1 g by mouth 4 (four) times daily.     Turmeric 500 MG CAPS Take 500 mg by mouth daily.      No current facility-administered medications for this visit.    Medication Side Effects: None  Allergies:  Allergies  Allergen Reactions   Tapentadol Swelling and Rash   Aloe Hives   Duloxetine Other (See Comments)    RESTLESSNESS, URGES TO MOVE   Gabapentin Swelling    SWELLING REACTION OF FEET    Olive Oil Hives   Pregabalin Other (See Comments)    Heart palpitations, Fainting, chest pain, sweats   Metoclopramide Other (See Comments)   Zonisamide Other (See Comments)    WORSENING PAIN    Past Medical History:  Diagnosis Date   Fibromyalgia    Fibromyalgia 06/28/2019   GERD (gastroesophageal reflux disease)    Headache    Insomnia    Insulin dependent type 1 diabetes mellitus ([place])    LGSIL of cervix of undetermined significance 10/2018   Colposcopy normal with ECC showing atypical fragments.  Recommend follow-up Pap smear/HPV at next annual exam.   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    Past Medical History, Surgical history, Social history, and Family history were reviewed and updated as appropriate.   Please see review of systems for further details on the  patient's review from today.   Objective:   Physical Exam:  There were no vitals taken for this visit.  Physical Exam Constitutional:      General: She is not in acute distress. Musculoskeletal:        General: No deformity.  Neurological:     Mental Status: She is alert and oriented to person, place, and time.     Coordination: Coordination normal.  Psychiatric:        Attention and Perception: Attention and perception normal. She does not perceive auditory or visual hallucinations.        Mood and Affect: Mood normal. Mood is not anxious or depressed. Affect is not labile, blunt, angry or inappropriate.        Speech: Speech normal.        Behavior: Behavior normal.  Thought Content: Thought content normal. Thought content is not paranoid or delusional. Thought content does not include homicidal or suicidal ideation. Thought content does not include homicidal or suicidal plan.        Cognition and Memory: Cognition and memory normal.        Judgment: Judgment normal.     Comments: Insight intact     Lab Review:     Component Value Date/Time   NA 137 05/12/2020 1357   K 3.7 05/12/2020 1357   CL 106 05/12/2020 1357   CO2 22 05/12/2020 1357   GLUCOSE 121 (H) 05/12/2020 1357   BUN 7 05/12/2020 1357   CREATININE 0.78 05/12/2020 1357   CREATININE 0.55 12/25/2013 1455   CALCIUM 8.9 05/12/2020 1357   PROT 7.6 05/11/2020 1604   ALBUMIN 3.9 05/11/2020 1604   AST 14 (L) 05/11/2020 1604   ALT 13 05/11/2020 1604   ALKPHOS 87 05/11/2020 1604   BILITOT 1.3 (H) 05/11/2020 1604   GFRNONAA >60 05/12/2020 1357   GFRAA >60 07/05/2019 1254       Component Value Date/Time   WBC 9.0 05/11/2020 1604   RBC 3.86 (L) 05/11/2020 1604   HGB 12.6 05/11/2020 1753   HCT 37.0 05/11/2020 1753   PLT 457 (H) 05/11/2020 1604   MCV 95.6 05/11/2020 1604   MCV 97.7 (A) 05/24/2013 2021   MCH 30.6 05/11/2020 1604   MCHC 32.0 05/11/2020 1604   RDW 12.9 05/11/2020 1604   LYMPHSABS 3.4  10/26/2017 0607   MONOABS 0.9 10/26/2017 0607   EOSABS 0.1 10/26/2017 0607   BASOSABS 0.1 10/26/2017 0607    No results found for: "POCLITH", "LITHIUM"   No results found for: "PHENYTOIN", "PHENOBARB", "VALPROATE", "CBMZ"   .res Assessment: Plan:    Plan:  Increase Prozac 46m to 365mdaily  Wellbutrin XL 30031maily - denies seizure history. Adderall XR 64m37mD Clonazepam 1mg 49mly to BID as needed   RTC 4 weeks  Discussed potential benefits, risks, and side effects of stimulants with patient to include increased heart rate, palpitations, insomnia, increased anxiety, increased irritability, or decreased appetite.  Instructed patient to contact office if experiencing any significant tolerability issues.   Discussed potential benefits, risk, and side effects of benzodiazepines to include potential risk of tolerance and dependence, as well as possible drowsiness.  Advised patient not to drive if experiencing drowsiness and to take lowest possible effective dose to minimize risk of dependence and tolerance.   Please see After Visit Summary for patient specific instructions.  No future appointments.  No orders of the defined types were placed in this encounter.   -------------------------------

## 2021-09-23 ENCOUNTER — Other Ambulatory Visit: Payer: Self-pay | Admitting: Adult Health

## 2021-09-23 DIAGNOSIS — F331 Major depressive disorder, recurrent, moderate: Secondary | ICD-10-CM

## 2021-09-29 ENCOUNTER — Encounter (INDEPENDENT_AMBULATORY_CARE_PROVIDER_SITE_OTHER): Payer: BC Managed Care – PPO | Admitting: Ophthalmology

## 2021-09-29 DIAGNOSIS — E103593 Type 1 diabetes mellitus with proliferative diabetic retinopathy without macular edema, bilateral: Secondary | ICD-10-CM | POA: Diagnosis not present

## 2021-09-29 DIAGNOSIS — H43812 Vitreous degeneration, left eye: Secondary | ICD-10-CM

## 2021-10-16 ENCOUNTER — Encounter: Payer: Self-pay | Admitting: Adult Health

## 2021-10-16 ENCOUNTER — Ambulatory Visit (INDEPENDENT_AMBULATORY_CARE_PROVIDER_SITE_OTHER): Payer: BC Managed Care – PPO | Admitting: Adult Health

## 2021-10-16 DIAGNOSIS — F489 Nonpsychotic mental disorder, unspecified: Secondary | ICD-10-CM | POA: Diagnosis not present

## 2021-10-16 DIAGNOSIS — F331 Major depressive disorder, recurrent, moderate: Secondary | ICD-10-CM

## 2021-10-16 DIAGNOSIS — F411 Generalized anxiety disorder: Secondary | ICD-10-CM | POA: Diagnosis not present

## 2021-10-16 DIAGNOSIS — F410 Panic disorder [episodic paroxysmal anxiety] without agoraphobia: Secondary | ICD-10-CM

## 2021-10-16 DIAGNOSIS — F988 Other specified behavioral and emotional disorders with onset usually occurring in childhood and adolescence: Secondary | ICD-10-CM | POA: Diagnosis not present

## 2021-10-16 MED ORDER — FLUOXETINE HCL 40 MG PO CAPS: ORAL_CAPSULE | ORAL | 5 refills | Status: DC

## 2021-10-16 NOTE — Progress Notes (Addendum)
Theresa [name] [id number] 01-11-1991 31 y.o.  Subjective:   Patient ID:  Theresa [name] is a 31 y.o. (DOB Dec 17, 1990) female.  Chief Complaint: No chief complaint on file.   HPI Theresa [name] presents to the office today for follow-up of panic attacks, anxiety, depression, ADHD, and insomnia.   Describes mood today as "not good". Pleasant. Decreased tearfulness. Mood symptoms - reports depression, anxiety and irritability. Reports worry and rumination. Reports over thinking. Reports panic attacks. Stating "I don't feel a lot better than I did". Feels like the increase in Prozac has been helpful. Continues to struggle with pain associated with Fibromyalgia. Reports her pain issues are making her mood worse. Has still not found a new pain manager - hoping to find someone soon. Varying interest and motivation. Taking other medications as prescribed.  Energy levels lower. Active, does not have a regular exercise routine.   Enjoys some usual interests and activities. Single. Lives with aunt and dog - "[place]". Family local.   Appetite varies. Weight stable - 200 pounds. Sleeps well most nights. Averages 6 to 8 hours. Improved focus and concentration - taking Adderall XR formula. Completing tasks. Managing some aspects of household.  Denies SI or HI.  Denies AH or VH.  Previous medications: Adderall IR causes GERD.   PHQ2-9    Flowsheet Row [facility] 08/24/2016 [facility] from 07/20/2016 [facility] Nutrition from 01/15/2014 in [facility]  PHQ-2 Total Score 0 0 0 0      Flowsheet Row ED to Hosp-Admission (Discharged) from 05/11/2020 in [facility] Most recent reading at 05/11/2020  3:54 PM ED to Hosp-Admission (Discharged) from 04/07/2020 in [facility] Most recent reading at 04/07/2020  4:56 PM ED from 04/07/2020 in [facility] [place] Most recent reading at 04/07/2020  4:11 PM  C-SSRS RISK CATEGORY No Risk No Risk No Risk        Review of Systems:  Review of Systems  Musculoskeletal:  Negative for gait problem.  Neurological:  Negative for tremors.  Psychiatric/Behavioral:         Please refer to HPI    Medications: I have reviewed the patient's current medications.  Current Outpatient Medications  Medication Sig Dispense Refill   amphetamine-dextroamphetamine (ADDERALL XR) 30 MG 24 hr capsule Take 1 capsule (30 mg total) by mouth 2 (two) times daily. 60 capsule 0   B-COMPLEX-C PO Take 1 capsule by mouth daily.      buPROPion (WELLBUTRIN XL) 300 MG 24 hr tablet Take 1 tablet (300 mg total) by mouth daily. 30 tablet 5   cholecalciferol (VITAMIN D3) 25 MCG (1000 UNIT) tablet Take 1,000 Units by mouth daily.     clonazePAM (KLONOPIN) 1 MG tablet Take 1 tablet (1 mg total) by mouth 2 (two) times daily as needed for anxiety. 60 tablet 2   CONTOUR NEXT TEST test strip 1 each by Other route See admin instructions. 7 times daily     dexlansoprazole (DEXILANT) 60 MG capsule Take 1 capsule by mouth daily.     diphenoxylate-atropine (LOMOTIL) 2.5-0.025 MG tablet Take 1 tablet by mouth 4 (four) times daily as needed.     etonogestrel (NEXPLANON) 68 MG IMPL implant 1 each by Subdermal route continuous.      famotidine (PEPCID) 40 MG tablet Take 1  tablet by mouth daily.     fluconazole (DIFLUCAN) 200 MG tablet Take 200 mg by mouth every other day. (Patient not taking: Reported on 05/11/2020)     FLUoxetine (PROZAC) 40 MG capsule Take one capsule daily. 30 capsule 5   hydrOXYzine (ATARAX/VISTARIL) 10 MG tablet Take 10 mg by mouth every 8 (eight) hours as needed for itching.     insulin aspart (NOVOLOG) 100 UNIT/ML FlexPen Inject 5-20 Units into the skin See admin instructions. Sliding scale: If blood sugar over 150, takes 5  units. Maximum 20 units.     insulin degludec (TRESIBA) 100 UNIT/ML FlexTouch Pen Inject 50 Units into the skin daily.     NARCAN 4 MG/0.1ML LIQD nasal spray kit Place 1 spray into the nose once. Overdose     ondansetron (ZOFRAN-ODT) 8 MG disintegrating tablet Take 8 mg by mouth 3 (three) times daily as needed.     OVER THE COUNTER MEDICATION Take 1 Dose by mouth daily as needed (nausea/vomiting).     Promethazine HCl 6.25 MG/5ML SOLN Take 5 mLs by mouth every 8 (eight) hours as needed (cough).     sucralfate (CARAFATE) 1 g tablet Take 1 g by mouth 4 (four) times daily.     Turmeric 500 MG CAPS Take 500 mg by mouth daily.      No current facility-administered medications for this visit.    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  Recommend follow-up Pap smear/HPV at next annual exam.   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    Past Medical History, Surgical history, Social history, and Family history were reviewed and updated as appropriate.   Please see review of systems for further details on the patient's review from today.   Objective:   Physical Exam:  There were no vitals  taken for this visit.  Physical Exam Constitutional:      General: She is not in acute distress. Musculoskeletal:        General: No deformity.  Neurological:     Mental Status: She is alert and oriented to person, place, and time.     Coordination: Coordination normal.  Psychiatric:        Attention and Perception: Attention and perception normal. She does not perceive auditory or visual hallucinations.        Mood and Affect: Mood normal. Mood is not anxious or depressed. Affect is not labile, blunt, angry or inappropriate.        Speech: Speech normal.        Behavior: Behavior normal.  Thought Content: Thought content normal. Thought content is not paranoid or delusional. Thought content does not include homicidal or suicidal ideation. Thought content does not include homicidal or suicidal plan.        Cognition and Memory: Cognition and memory normal.        Judgment: Judgment normal.     Comments: Insight intact     Lab Review:     Component Value Date/Time   NA 137 05/12/2020 1357   K 3.7 05/12/2020 1357   CL 106 05/12/2020 1357   CO2 22 05/12/2020 1357   GLUCOSE 121 (H) 05/12/2020 1357   BUN 7 05/12/2020 1357   CREATININE 0.78 05/12/2020 1357   CREATININE 0.55 12/25/2013 1455   CALCIUM 8.9 05/12/2020 1357   PROT 7.6 05/11/2020 1604   ALBUMIN 3.9 05/11/2020 1604   AST 14 (L) 05/11/2020 1604   ALT 13 05/11/2020 1604   ALKPHOS 87 05/11/2020 1604   BILITOT 1.3 (H) 05/11/2020 1604   GFRNONAA >60 05/12/2020 1357   GFRAA >60 07/05/2019 1254       Component Value Date/Time   WBC 9.0 05/11/2020 1604   RBC 3.86 (L) 05/11/2020 1604   HGB 12.6 05/11/2020 1753   HCT 37.0 05/11/2020 1753   PLT 457 (H) 05/11/2020 1604   MCV 95.6 05/11/2020 1604   MCV 97.7 (A) 05/24/2013 2021   MCH 30.6 05/11/2020 1604   MCHC 32.0 05/11/2020 1604   RDW 12.9 05/11/2020 1604   LYMPHSABS 3.4 10/26/2017 0607   MONOABS 0.9 10/26/2017 0607   EOSABS 0.1 10/26/2017 0607   BASOSABS 0.1  10/26/2017 0607    No results found for: "POCLITH", "LITHIUM"   No results found for: "PHENYTOIN", "PHENOBARB", "VALPROATE", "CBMZ"   .res Assessment: Plan:    Plan:  Increase Prozac 53m to 465mdaily  Wellbutrin XL 30055maily - denies seizure history. Adderall XR 68m53mD Clonazepam 1mg 44m as needed   RTC 4 weeks  Discussed potential benefits, risks, and side effects of stimulants with patient to include increased heart rate, palpitations, insomnia, increased anxiety, increased irritability, or decreased appetite.  Instructed patient to contact office if experiencing any significant tolerability issues.   Discussed potential benefits, risk, and side effects of benzodiazepines to include potential risk of tolerance and dependence, as well as possible drowsiness.  Advised patient not to drive if experiencing drowsiness and to take lowest possible effective dose to minimize risk of dependence and tolerance. Diagnoses and all orders for this visit:  No diagnosis on Axis I  Major depressive disorder, recurrent episode, moderate (HCC) -     FLUoxetine (PROZAC) 40 MG capsule; Take one capsule daily.  Generalized anxiety disorder  Attention deficit disorder, unspecified hyperactivity presence  Panic attacks     Please see After Visit Summary for patient specific instructions.  Future Appointments  Date Time Provider [place]/20[place]  2:00 PM MatthHayden PedroTRE-TRE None    No orders of the defined types were placed in this encounter.   -------------------------------

## 2021-10-16 NOTE — Addendum Note (Signed)
Addended by: Dorothyann Gibbs on: 10/16/2021 04:44 PM   Modules accepted: Orders, Level of Service

## 2021-11-10 ENCOUNTER — Other Ambulatory Visit: Payer: Self-pay

## 2021-11-10 DIAGNOSIS — F410 Panic disorder [episodic paroxysmal anxiety] without agoraphobia: Secondary | ICD-10-CM

## 2021-11-10 MED ORDER — AMPHETAMINE-DEXTROAMPHET ER 30 MG PO CP24
30.0000 mg | ORAL_CAPSULE | Freq: Two times a day (BID) | ORAL | 0 refills | Status: DC
Start: 2021-11-10 — End: 2021-12-08

## 2021-11-13 ENCOUNTER — Ambulatory Visit: Payer: BC Managed Care – PPO | Admitting: Adult Health

## 2021-11-18 ENCOUNTER — Ambulatory Visit: Payer: BC Managed Care – PPO | Admitting: Adult Health

## 2021-12-02 ENCOUNTER — Ambulatory Visit: Payer: BC Managed Care – PPO | Admitting: Adult Health

## 2021-12-08 ENCOUNTER — Telehealth (INDEPENDENT_AMBULATORY_CARE_PROVIDER_SITE_OTHER): Payer: BC Managed Care – PPO | Admitting: Adult Health

## 2021-12-08 ENCOUNTER — Encounter: Payer: Self-pay | Admitting: Adult Health

## 2021-12-08 DIAGNOSIS — F988 Other specified behavioral and emotional disorders with onset usually occurring in childhood and adolescence: Secondary | ICD-10-CM

## 2021-12-08 DIAGNOSIS — G4700 Insomnia, unspecified: Secondary | ICD-10-CM

## 2021-12-08 DIAGNOSIS — F331 Major depressive disorder, recurrent, moderate: Secondary | ICD-10-CM | POA: Diagnosis not present

## 2021-12-08 DIAGNOSIS — F410 Panic disorder [episodic paroxysmal anxiety] without agoraphobia: Secondary | ICD-10-CM | POA: Diagnosis not present

## 2021-12-08 DIAGNOSIS — F908 Attention-deficit hyperactivity disorder, other type: Secondary | ICD-10-CM

## 2021-12-08 DIAGNOSIS — F411 Generalized anxiety disorder: Secondary | ICD-10-CM

## 2021-12-08 MED ORDER — CLONAZEPAM 1 MG PO TABS
1.0000 mg | ORAL_TABLET | Freq: Two times a day (BID) | ORAL | 2 refills | Status: DC | PRN
Start: 2021-12-08 — End: 2022-06-01

## 2021-12-08 MED ORDER — FLUOXETINE HCL 40 MG PO CAPS: ORAL_CAPSULE | ORAL | 5 refills | Status: DC

## 2021-12-08 MED ORDER — AMPHETAMINE-DEXTROAMPHET ER 30 MG PO CP24: 30.0000 mg | ORAL_CAPSULE | Freq: Two times a day (BID) | ORAL | 0 refills | Status: DC

## 2021-12-08 MED ORDER — TRAZODONE HCL 100 MG PO TABS: ORAL_TABLET | ORAL | 5 refills | Status: DC

## 2021-12-08 MED ORDER — BUPROPION HCL ER (XL) 300 MG PO TB24
300.0000 mg | ORAL_TABLET | Freq: Every day | ORAL | 5 refills | Status: DC
Start: 2021-12-08 — End: 2022-06-14

## 2021-12-08 NOTE — Progress Notes (Signed)
[name] [id number] January 01, 1991 31 y.o.  Virtual Visit via Video Note  I connected with pt @ on 12/08/21 at  2:40 PM EDT by a video enabled telemedicine application and verified that I am speaking with the correct person using two identifiers.   I discussed the limitations of evaluation and management by telemedicine and the availability of in person appointments. The patient expressed understanding and agreed to proceed.  I discussed the assessment and treatment plan with the patient. The patient was provided an opportunity to ask questions and all were answered. The patient agreed with the plan and demonstrated an understanding of the instructions.   The patient was advised to call back or seek an in-person evaluation if the symptoms worsen or if the condition fails to improve as anticipated.  I provided 25 minutes of non-face-to-face time during this encounter.  The patient was located at home.  The provider was located at [place].   Aloha Gell, NP   Subjective:   Patient ID:  Theresa [name] is a 31 y.o. (DOB 1991-02-12) female.  Chief Complaint: No chief complaint on file.   HPI Manaal Mandala presents for follow-up of panic attacks, anxiety, depression, ADHD, and insomnia.   Describes mood today as "not good". Pleasant. Decreased tearfulness. Mood symptoms - reports depression, anxiety and irritability - "I'm feeling about the same". Reports worry and rumination. Reports over thinking. Denies recent panic attacks - "more anxiety attacks". Stating "I don't feel a lot better than I did". Continues to struggle with pain associated with Fibromyalgia - looking for a new pain manager. Varying interest and motivation. Taking other medications as prescribed.  Energy levels lower. Active, does not have a regular exercise routine.   Enjoys some usual interests and activities. Single. Lives with aunt and dog - "[place]". Family local.   Appetite varies.  Weight stable - 200 pounds. Sleeps well most nights. Averages 6 to 8 hours. Improved focus and concentration - taking Adderall XR formula. Completing tasks. Managing some aspects of household.  Denies SI or HI.  Denies AH or VH.  Previous medications: Adderall IR causes GERD.   Review of Systems:  Review of Systems  Musculoskeletal:  Negative for gait problem.  Neurological:  Negative for tremors.  Psychiatric/Behavioral:         Please refer to HPI    Medications: I have reviewed the patient's current medications.  Current Outpatient Medications  Medication Sig Dispense Refill   traZODone (DESYREL) 100 MG tablet Take 1/2 to one tablet at bedtime. 30 tablet 5   amphetamine-dextroamphetamine (ADDERALL XR) 30 MG 24 hr capsule Take 1 capsule (30 mg total) by mouth 2 (two) times daily. 60 capsule 0   B-COMPLEX-C PO Take 1 capsule by mouth daily.      buPROPion (WELLBUTRIN XL) 300 MG 24 hr tablet Take 1 tablet (300 mg total) by mouth daily. 30 tablet 5   cholecalciferol (VITAMIN D3) 25 MCG (1000 UNIT) tablet Take 1,000 Units by mouth daily.     clonazePAM (KLONOPIN) 1 MG tablet Take 1 tablet (1 mg total) by mouth 2 (two) times daily as needed for anxiety. 60 tablet 2   CONTOUR NEXT TEST test strip 1 each by Other route See admin instructions. 7 times daily     dexlansoprazole (DEXILANT) 60 MG capsule Take 1 capsule by mouth daily.     diphenoxylate-atropine (LOMOTIL) 2.5-0.025 MG tablet Take 1 tablet by mouth 4 (four) times daily as needed.  etonogestrel (NEXPLANON) 68 MG IMPL implant 1 each by Subdermal route continuous.      famotidine (PEPCID) 40 MG tablet Take 1 tablet by mouth daily.     fluconazole (DIFLUCAN) 200 MG tablet Take 200 mg by mouth every other day. (Patient not taking: Reported on 05/11/2020)     FLUoxetine (PROZAC) 40 MG capsule Take one capsule daily. 30 capsule 5   hydrOXYzine (ATARAX/VISTARIL) 10 MG tablet Take 10 mg by mouth every 8 (eight) hours as needed for  itching.     insulin aspart (NOVOLOG) 100 UNIT/ML FlexPen Inject 5-20 Units into the skin See admin instructions. Sliding scale: If blood sugar over 150, takes 5 units. Maximum 20 units.     insulin degludec (TRESIBA) 100 UNIT/ML FlexTouch Pen Inject 50 Units into the skin daily.     NARCAN 4 MG/0.1ML LIQD nasal spray kit Place 1 spray into the nose once. Overdose     ondansetron (ZOFRAN-ODT) 8 MG disintegrating tablet Take 8 mg by mouth 3 (three) times daily as needed.     OVER THE COUNTER MEDICATION Take 1 Dose by mouth daily as needed (nausea/vomiting).     Promethazine HCl 6.25 MG/5ML SOLN Take 5 mLs by mouth every 8 (eight) hours as needed (cough).     sucralfate (CARAFATE) 1 g tablet Take 1 g by mouth 4 (four) times daily.     Turmeric 500 MG CAPS Take 500 mg by mouth daily.      No current facility-administered medications for this visit.    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  Recommend follow-up Pap smear/HPV at next annual exam.   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    Family History  Problem Relation Age of Onset   Diabetes Mother    Migraines Mother     Hypertension Mother    CVA Mother    Breast cancer Maternal Grandmother    Thyroid disease Maternal Grandmother    Cancer Maternal Grandfather     Social History   Socioeconomic History   Marital status: Single    Spouse name: Not on file   Number of children: Not on file   Years of education: Not on file   Highest education level: Not on file  Occupational History   Not on file  Tobacco Use   Smoking status: Never   Smokeless tobacco: Never  Vaping Use   Vaping Use: Never used  Substance and Sexual Activity   Alcohol use: No    Alcohol/week: 0.0 standard drinks of alcohol   Drug use: No   Sexual activity: Yes  Birth control/protection: Implant    Comment: 1ST INTERCOURSE- 16, PARTNERS - 6 . Nexplanon 06/27/2018  Other Topics Concern   Not on file  Social History Narrative   Student.  Not married.  Lives alone in a one story home.   Not working.   Social Determinants of Health   Financial Resource Strain: Not on file  Food Insecurity: Not on file  Transportation Needs: Not on file  Physical Activity: Not on file  Stress: Not on file  Social Connections: Not on file  Intimate Partner Violence: Not on file    Past Medical History, Surgical history, Social history, and Family history were reviewed and updated as appropriate.   Please see review of systems for further details on the patient's review from today.   Objective:   Physical Exam:  There were no vitals taken for this visit.  Physical Exam Constitutional:      General: She is not in acute distress. Musculoskeletal:        General: No deformity.  Neurological:     Mental Status: She is alert and oriented to person, place, and time.     Coordination: Coordination normal.  Psychiatric:        Attention and Perception: Attention and perception normal. She does not perceive auditory or visual hallucinations.        Mood and Affect: Mood normal. Mood is not anxious or depressed. Affect is not labile,  blunt, angry or inappropriate.        Speech: Speech normal.        Behavior: Behavior normal.        Thought Content: Thought content normal. Thought content is not paranoid or delusional. Thought content does not include homicidal or suicidal ideation. Thought content does not include homicidal or suicidal plan.        Cognition and Memory: Cognition and memory normal.        Judgment: Judgment normal.     Comments: Insight intact     Lab Review:     Component Value Date/Time   NA 137 05/12/2020 1357   K 3.7 05/12/2020 1357   CL 106 05/12/2020 1357   CO2 22 05/12/2020 1357   GLUCOSE 121 (H) 05/12/2020 1357   BUN 7 05/12/2020 1357   CREATININE 0.78 05/12/2020 1357   CREATININE 0.55 12/25/2013 1455   CALCIUM 8.9 05/12/2020 1357   PROT 7.6 05/11/2020 1604   ALBUMIN 3.9 05/11/2020 1604   AST 14 (L) 05/11/2020 1604   ALT 13 05/11/2020 1604   ALKPHOS 87 05/11/2020 1604   BILITOT 1.3 (H) 05/11/2020 1604   GFRNONAA >60 05/12/2020 1357   GFRAA >60 07/05/2019 1254       Component Value Date/Time   WBC 9.0 05/11/2020 1604   RBC 3.86 (L) 05/11/2020 1604   HGB 12.6 05/11/2020 1753   HCT 37.0 05/11/2020 1753   PLT 457 (H) 05/11/2020 1604   MCV 95.6 05/11/2020 1604   MCV 97.7 (A) 05/24/2013 2021   MCH 30.6 05/11/2020 1604   MCHC 32.0 05/11/2020 1604   RDW 12.9 05/11/2020 1604   LYMPHSABS 3.4 10/26/2017 0607   MONOABS 0.9 10/26/2017 0607   EOSABS 0.1 10/26/2017 0607   BASOSABS 0.1 10/26/2017 0607    No results found for: "POCLITH", "LITHIUM"   No results found for: "PHENYTOIN", "PHENOBARB", "VALPROATE", "CBMZ"   .res Assessment: Plan:    Plan:  Prozac 40m daily  Wellbutrin XL 3032mdaily - denies seizure history. Adderall XR 3074mID Clonazepam 1mg13m  BID as needed Add Trazadone 141m - 1/2 to one tablet at bedtime.   RTC 4 weeks  Discussed potential benefits, risks, and side effects of stimulants with patient to include increased heart rate, palpitations, insomnia,  increased anxiety, increased irritability, or decreased appetite.  Instructed patient to contact office if experiencing any significant tolerability issues.   Discussed potential benefits, risk, and side effects of benzodiazepines to include potential risk of tolerance and dependence, as well as possible drowsiness.  Advised patient not to drive if experiencing drowsiness and to take lowest possible effective dose to minimize risk of dependence and tolerance.  Diagnoses and all orders for this visit:  Major depressive disorder, recurrent episode, moderate (HCC) -     buPROPion (WELLBUTRIN XL) 300 MG 24 hr tablet; Take 1 tablet (300 mg total) by mouth daily. -     FLUoxetine (PROZAC) 40 MG capsule; Take one capsule daily.  Generalized anxiety disorder -     buPROPion (WELLBUTRIN XL) 300 MG 24 hr tablet; Take 1 tablet (300 mg total) by mouth daily. -     clonazePAM (KLONOPIN) 1 MG tablet; Take 1 tablet (1 mg total) by mouth 2 (two) times daily as needed for anxiety.  Attention deficit hyperactivity disorder (ADHD), other type -     buPROPion (WELLBUTRIN XL) 300 MG 24 hr tablet; Take 1 tablet (300 mg total) by mouth daily.  Panic attacks -     amphetamine-dextroamphetamine (ADDERALL XR) 30 MG 24 hr capsule; Take 1 capsule (30 mg total) by mouth 2 (two) times daily.  Insomnia, unspecified type -     traZODone (DESYREL) 100 MG tablet; Take 1/2 to one tablet at bedtime.  Attention deficit disorder, unspecified hyperactivity presence -     clonazePAM (KLONOPIN) 1 MG tablet; Take 1 tablet (1 mg total) by mouth 2 (two) times daily as needed for anxiety.     Please see After Visit Summary for patient specific instructions.  Future Appointments  Date Time Provider [place] 03/31/2022  2:00 PM MHayden Pedro MD TRE-TRE None    No orders of the defined types were placed in this encounter.     -------------------------------

## 2022-01-05 ENCOUNTER — Telehealth (INDEPENDENT_AMBULATORY_CARE_PROVIDER_SITE_OTHER): Payer: Self-pay | Admitting: Adult Health

## 2022-01-05 DIAGNOSIS — F489 Nonpsychotic mental disorder, unspecified: Secondary | ICD-10-CM

## 2022-01-05 NOTE — Progress Notes (Signed)
Patient no show appointment. Did not connect to video call after 10 mins. Called patient and LVM to return call.

## 2022-01-05 NOTE — Progress Notes (Deleted)
Theresa [name] [id number] Sep 02, 1990 31 y.o.  Virtual Visit via Video Note  I connected with pt @ on 01/05/22 at  3:40 PM EDT by a video enabled telemedicine application and verified that I am speaking with the correct person using two identifiers.   I discussed the limitations of evaluation and management by telemedicine and the availability of in person appointments. The patient expressed understanding and agreed to proceed.  I discussed the assessment and treatment plan with the patient. The patient was provided an opportunity to ask questions and all were answered. The patient agreed with the plan and demonstrated an understanding of the instructions.   The patient was advised to call back or seek an in-person evaluation if the symptoms worsen or if the condition fails to improve as anticipated.  I provided *** minutes of non-face-to-face time during this encounter.  The patient was located at home.  The provider was located at [place].   Theresa Gell, NP   Subjective:   Patient ID:  Theresa [name] is a 31 y.o. (DOB 1990-09-29) female.  Chief Complaint: No chief complaint on file.   HPI Theresa [name] presents for follow-up of ***   Review of Systems:  Review of Systems  Medications: {medication reviewed/display:3041432}  Current Outpatient Medications  Medication Sig Dispense Refill   amphetamine-dextroamphetamine (ADDERALL XR) 30 MG 24 hr capsule Take 1 capsule (30 mg total) by mouth 2 (two) times daily. 60 capsule 0   B-COMPLEX-C PO Take 1 capsule by mouth daily.      buPROPion (WELLBUTRIN XL) 300 MG 24 hr tablet Take 1 tablet (300 mg total) by mouth daily. 30 tablet 5   cholecalciferol (VITAMIN D3) 25 MCG (1000 UNIT) tablet Take 1,000 Units by mouth daily.     clonazePAM (KLONOPIN) 1 MG tablet Take 1 tablet (1 mg total) by mouth 2 (two) times daily as needed for anxiety. 60 tablet 2   CONTOUR NEXT TEST test strip 1 each by Other route See  admin instructions. 7 times daily     dexlansoprazole (DEXILANT) 60 MG capsule Take 1 capsule by mouth daily.     diphenoxylate-atropine (LOMOTIL) 2.5-0.025 MG tablet Take 1 tablet by mouth 4 (four) times daily as needed.     etonogestrel (NEXPLANON) 68 MG IMPL implant 1 each by Subdermal route continuous.      famotidine (PEPCID) 40 MG tablet Take 1 tablet by mouth daily.     fluconazole (DIFLUCAN) 200 MG tablet Take 200 mg by mouth every other day. (Patient not taking: Reported on 05/11/2020)     FLUoxetine (PROZAC) 40 MG capsule Take one capsule daily. 30 capsule 5   hydrOXYzine (ATARAX/VISTARIL) 10 MG tablet Take 10 mg by mouth every 8 (eight) hours as needed for itching.     insulin aspart (NOVOLOG) 100 UNIT/ML FlexPen Inject 5-20 Units into the skin See admin instructions. Sliding scale: If blood sugar over 150, takes 5 units. Maximum 20 units.     insulin degludec (TRESIBA) 100 UNIT/ML FlexTouch Pen Inject 50 Units into the skin daily.     NARCAN 4 MG/0.1ML LIQD nasal spray kit Place 1 spray into the nose once. Overdose     ondansetron (ZOFRAN-ODT) 8 MG disintegrating tablet Take 8 mg by mouth 3 (three) times daily as needed.     OVER THE COUNTER MEDICATION Take 1 Dose by mouth daily as needed (nausea/vomiting).     Promethazine HCl 6.25 MG/5ML SOLN Take 5 mLs by mouth every 8 (eight)  hours as needed (cough).     sucralfate (CARAFATE) 1 g tablet Take 1 g by mouth 4 (four) times daily.     traZODone (DESYREL) 100 MG tablet Take 1/2 to one tablet at bedtime. 30 tablet 5   Turmeric 500 MG CAPS Take 500 mg by mouth daily.      No current facility-administered medications for this visit.    Medication Side Effects: {Medication Side Effects (Optional):21014029}  Allergies:  Allergies  Allergen Reactions   Tapentadol Swelling and Rash   Aloe Hives   Duloxetine Other (See Comments)    RESTLESSNESS, URGES TO MOVE   Gabapentin Swelling    SWELLING REACTION OF FEET    Olive Oil Hives    Pregabalin Other (See Comments)    Heart palpitations, Fainting, chest pain, sweats   Metoclopramide Other (See Comments)   Zonisamide Other (See Comments)    WORSENING PAIN    Past Medical History:  Diagnosis Date   Fibromyalgia    Fibromyalgia 06/28/2019   GERD (gastroesophageal reflux disease)    Headache    Insomnia    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  Recommend follow-up Pap smear/HPV at next annual exam.   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    Family History  Problem Relation Age of Onset   Diabetes Mother    Migraines Mother    Hypertension Mother    CVA Mother    Breast cancer Maternal Grandmother    Thyroid disease Maternal Grandmother    Cancer Maternal Grandfather     Social History   Socioeconomic History   Marital status: Single    Spouse name: Not on file   Number of children: Not on file   Years of education: Not on file   Highest education level: Not on file  Occupational History   Not on file  Tobacco Use   Smoking status: Never   Smokeless tobacco: Never  Vaping Use   Vaping Use: Never used  Substance and Sexual Activity   Alcohol use: No    Alcohol/week: 0.0 standard drinks of alcohol   Drug use: No   Sexual activity: Yes    Birth control/protection: Implant    Comment: 1ST INTERCOURSE- 38, PARTNERS - 6 . Nexplanon 06/27/2018  Other Topics Concern   Not on file  Social History Narrative   Student.  Not married.  Lives alone in a one story home.   Not working.   Social Determinants of Health   Financial Resource Strain: Not on file  Food Insecurity: Not on file  Transportation Needs: Not on file  Physical Activity: Not on file  Stress: Not on file  Social Connections: Not on file  Intimate Partner  Violence: Not on file    Past Medical History, Surgical history, Social history, and Family history were reviewed and updated as appropriate.   Please see review of systems for further details on the patient's review from today.   Objective:   Physical Exam:  There were no vitals taken for this visit.  Physical Exam  Lab Review:     Component Value Date/Time   NA 137 05/12/2020 1357   K 3.7 05/12/2020 1357   CL 106 05/12/2020 1357   CO2 22 05/12/2020 1357   GLUCOSE  121 (H) 05/12/2020 1357   BUN 7 05/12/2020 1357   CREATININE 0.78 05/12/2020 1357   CREATININE 0.55 12/25/2013 1455   CALCIUM 8.9 05/12/2020 1357   PROT 7.6 05/11/2020 1604   ALBUMIN 3.9 05/11/2020 1604   AST 14 (L) 05/11/2020 1604   ALT 13 05/11/2020 1604   ALKPHOS 87 05/11/2020 1604   BILITOT 1.3 (H) 05/11/2020 1604   GFRNONAA >60 05/12/2020 1357   GFRAA >60 07/05/2019 1254       Component Value Date/Time   WBC 9.0 05/11/2020 1604   RBC 3.86 (L) 05/11/2020 1604   HGB 12.6 05/11/2020 1753   HCT 37.0 05/11/2020 1753   PLT 457 (H) 05/11/2020 1604   MCV 95.6 05/11/2020 1604   MCV 97.7 (A) 05/24/2013 2021   MCH 30.6 05/11/2020 1604   MCHC 32.0 05/11/2020 1604   RDW 12.9 05/11/2020 1604   LYMPHSABS 3.4 10/26/2017 0607   MONOABS 0.9 10/26/2017 0607   EOSABS 0.1 10/26/2017 0607   BASOSABS 0.1 10/26/2017 0607    No results found for: "POCLITH", "LITHIUM"   No results found for: "PHENYTOIN", "PHENOBARB", "VALPROATE", "CBMZ"   .res Assessment: Plan:    There are no diagnoses linked to this encounter.   Please see After Visit Summary for patient specific instructions.  Future Appointments  Date Time Provider [facility]  03/31/2022  2:00 PM Hayden Pedro, MD [facility] None    No orders of the defined types were placed in this encounter.     -------------------------------

## 2022-03-31 ENCOUNTER — Encounter (INDEPENDENT_AMBULATORY_CARE_PROVIDER_SITE_OTHER): Payer: BC Managed Care – PPO | Admitting: Ophthalmology

## 2022-03-31 DIAGNOSIS — H43812 Vitreous degeneration, left eye: Secondary | ICD-10-CM

## 2022-03-31 DIAGNOSIS — E103593 Type 1 diabetes mellitus with proliferative diabetic retinopathy without macular edema, bilateral: Secondary | ICD-10-CM | POA: Diagnosis not present

## 2022-05-31 ENCOUNTER — Other Ambulatory Visit: Payer: Self-pay | Admitting: Adult Health

## 2022-05-31 ENCOUNTER — Telehealth: Payer: Self-pay | Admitting: Adult Health

## 2022-05-31 DIAGNOSIS — F988 Other specified behavioral and emotional disorders with onset usually occurring in childhood and adolescence: Secondary | ICD-10-CM

## 2022-05-31 DIAGNOSIS — F411 Generalized anxiety disorder: Secondary | ICD-10-CM

## 2022-05-31 DIAGNOSIS — G4700 Insomnia, unspecified: Secondary | ICD-10-CM

## 2022-05-31 NOTE — Telephone Encounter (Signed)
Pt called and made an appt for 06/14/22. She needs refills on her klonopin 1 mg and her trazodone 100 mg. Pharmacy is [facility] on [address]

## 2022-06-01 ENCOUNTER — Other Ambulatory Visit: Payer: Self-pay

## 2022-06-01 DIAGNOSIS — G4700 Insomnia, unspecified: Secondary | ICD-10-CM

## 2022-06-01 DIAGNOSIS — F988 Other specified behavioral and emotional disorders with onset usually occurring in childhood and adolescence: Secondary | ICD-10-CM

## 2022-06-01 DIAGNOSIS — F411 Generalized anxiety disorder: Secondary | ICD-10-CM

## 2022-06-01 MED ORDER — TRAZODONE HCL 100 MG PO TABS: ORAL_TABLET | ORAL | 0 refills | Status: DC

## 2022-06-01 MED ORDER — CLONAZEPAM 1 MG PO TABS: 1.0000 mg | ORAL_TABLET | Freq: Two times a day (BID) | ORAL | 0 refills | Status: DC | PRN

## 2022-06-01 NOTE — Telephone Encounter (Signed)
Pended.

## 2022-06-14 ENCOUNTER — Encounter: Payer: Self-pay | Admitting: Adult Health

## 2022-06-14 ENCOUNTER — Ambulatory Visit (INDEPENDENT_AMBULATORY_CARE_PROVIDER_SITE_OTHER): Payer: BC Managed Care – PPO | Admitting: Adult Health

## 2022-06-14 DIAGNOSIS — F988 Other specified behavioral and emotional disorders with onset usually occurring in childhood and adolescence: Secondary | ICD-10-CM | POA: Diagnosis not present

## 2022-06-14 DIAGNOSIS — G4700 Insomnia, unspecified: Secondary | ICD-10-CM | POA: Diagnosis not present

## 2022-06-14 DIAGNOSIS — F331 Major depressive disorder, recurrent, moderate: Secondary | ICD-10-CM | POA: Diagnosis not present

## 2022-06-14 DIAGNOSIS — F908 Attention-deficit hyperactivity disorder, other type: Secondary | ICD-10-CM

## 2022-06-14 DIAGNOSIS — F411 Generalized anxiety disorder: Secondary | ICD-10-CM | POA: Diagnosis not present

## 2022-06-14 MED ORDER — FLUOXETINE HCL 40 MG PO CAPS: ORAL_CAPSULE | ORAL | 5 refills | Status: DC

## 2022-06-14 MED ORDER — CLONAZEPAM 1 MG PO TABS: 1.0000 mg | ORAL_TABLET | Freq: Two times a day (BID) | ORAL | 2 refills | Status: DC | PRN

## 2022-06-14 MED ORDER — TRAZODONE HCL 100 MG PO TABS: ORAL_TABLET | ORAL | 5 refills | Status: DC

## 2022-06-14 MED ORDER — BUPROPION HCL ER (XL) 300 MG PO TB24: 300.0000 mg | ORAL_TABLET | Freq: Every day | ORAL | 5 refills | Status: DC

## 2022-06-14 MED ORDER — LISDEXAMFETAMINE DIMESYLATE 30 MG PO CAPS: 30.0000 mg | ORAL_CAPSULE | Freq: Every day | ORAL | 0 refills | Status: DC

## 2022-06-14 NOTE — Progress Notes (Signed)
Theresa [name] [id number] 15-Jan-1991 32 y.o.  Subjective:   Patient ID:  Theresa [name] is a 32 y.o. (DOB [birth year]) female.  Chief Complaint: No chief complaint on file.   HPI Scarlettrose Ellingboe presents to the office today for follow-up of panic attacks, anxiety, depression, ADHD, and insomnia.   Describes mood today as "not too good". Pleasant. Decreased tearfulness. Mood symptoms - reports depression, anxiety and irritability. Reports worry, rumination, and over thinking. Denies obsessive thoughts and acts. Denies recent panic attacks. Mood is variable. Stating "I've been struggling". Reports non compliance with medications since last visit, but has been consistent since January. Varying interest and motivation. Taking other medications as prescribed.  Energy levels lower. Active, does not have a regular exercise routine.   Enjoys some usual interests and activities. Single. Lives with aunt and dog - "[place]". Family local.   Appetite varies. Weight stable - 205 pounds. Sleeps well most nights. Averages 6 to 8 hours on a good week - otherwise averages 5 hours. Reports difficulties with focus and concentration - feels like Adderall is making her too jittery. Willing to consider other options. Completing tasks. Managing some aspects of household.  Denies SI or HI.  Denies AH or VH. Denies self harm. Denies substance use.  Previous medications: Adderall IR causes GERD.   PHQ2-9    Flowsheet Row Nutrition from 11/24/2016 in [facility] Nutrition from 08/24/2016 in [facility] at [facility] Nutrition from 07/20/2016 in [facility] Nutrition from 01/15/2014 in [place] [facility] at [facility] Total Score 0 0 0 0      Flowsheet Row ED to Hosp-Admission (Discharged) from 05/11/2020 in [facility] Most recent reading at 05/11/2020  3:54 PM ED to Hosp-Admission (Discharged) from 04/07/2020 in [facility] Most recent reading at 04/07/2020  4:56 PM ED from 04/07/2020 in [facility] [place] Most recent reading at 04/07/2020  4:11 PM  C-SSRS RISK CATEGORY No Risk No Risk No Risk        Review of Systems:  Review of Systems  Musculoskeletal:  Negative for gait problem.  Neurological:  Negative for tremors.  Psychiatric/Behavioral:         Please refer to HPI    Medications: I have reviewed the patient's current medications.  Current Outpatient Medications  Medication Sig Dispense Refill   amphetamine-dextroamphetamine (ADDERALL XR) 30 MG 24 hr capsule Take 1 capsule (30 mg total) by mouth 2 (two) times daily. 60 capsule 0   B-COMPLEX-C PO Take 1 capsule by mouth daily.      buPROPion (WELLBUTRIN XL) 300 MG 24 hr tablet Take 1 tablet (300 mg total) by mouth daily. 30 tablet 5   cholecalciferol (VITAMIN D3) 25 MCG (1000 UNIT) tablet Take 1,000 Units by mouth daily.     clonazePAM (KLONOPIN) 1 MG tablet Take 1 tablet (1 mg total) by mouth 2 (two) times daily as needed for anxiety. 26 tablet 0   CONTOUR NEXT TEST test strip 1 each by Other route See admin instructions. 7 times daily     dexlansoprazole (DEXILANT) 60 MG capsule Take 1 capsule by mouth daily.     diphenoxylate-atropine (LOMOTIL) 2.5-0.025 MG tablet Take 1 tablet by mouth 4 (four) times daily as needed.     etonogestrel (NEXPLANON) 68 MG IMPL  implant 1 each by Subdermal route continuous.      famotidine (PEPCID) 40 MG tablet Take 1 tablet by mouth daily.     fluconazole (DIFLUCAN) 200 MG tablet Take 200 mg by mouth every other day. (Patient not taking: Reported on 05/11/2020)     FLUoxetine (PROZAC) 40 MG capsule Take one capsule daily. 30 capsule 5   hydrOXYzine (ATARAX/VISTARIL) 10 MG tablet Take 10 mg by mouth every 8 (eight) hours as needed for itching.      insulin aspart (NOVOLOG) 100 UNIT/ML FlexPen Inject 5-20 Units into the skin See admin instructions. Sliding scale: If blood sugar over 150, takes 5 units. Maximum 20 units.     insulin degludec (TRESIBA) 100 UNIT/ML FlexTouch Pen Inject 50 Units into the skin daily.     NARCAN 4 MG/0.1ML LIQD nasal spray kit Place 1 spray into the nose once. Overdose     ondansetron (ZOFRAN-ODT) 8 MG disintegrating tablet Take 8 mg by mouth 3 (three) times daily as needed.     OVER THE COUNTER MEDICATION Take 1 Dose by mouth daily as needed (nausea/vomiting).     Promethazine HCl 6.25 MG/5ML SOLN Take 5 mLs by mouth every 8 (eight) hours as needed (cough).     sucralfate (CARAFATE) 1 g tablet Take 1 g by mouth 4 (four) times daily.     traZODone (DESYREL) 100 MG tablet Take 1/2 to one tablet at bedtime. 13 tablet 0   Turmeric 500 MG CAPS Take 500 mg by mouth daily.      No current facility-administered medications for this visit.    Medication Side Effects: None  Allergies:  Allergies  Allergen Reactions   Tapentadol Swelling and Rash   Aloe Hives   Duloxetine Other (See Comments)    RESTLESSNESS, URGES TO MOVE   Gabapentin Swelling    SWELLING REACTION OF FEET    Olive Oil Hives   Pregabalin Other (See Comments)    Heart palpitations, Fainting, chest pain, sweats   Metoclopramide Other (See Comments)   Zonisamide Other (See Comments)    WORSENING PAIN    Past Medical History:  Diagnosis Date   Fibromyalgia    Fibromyalgia 06/28/2019   GERD (gastroesophageal reflux disease)    Headache    Insomnia    Insulin dependent type 1 diabetes mellitus ([facility])    LGSIL of cervix of undetermined significance 10/2018   Colposcopy normal with ECC showing atypical fragments.  Recommend follow-up Pap smear/HPV at next annual exam.   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    Past Medical History,  Surgical history, Social history, and Family history were reviewed and updated as appropriate.   Please see review of systems for further details on the patient's review from today.   Objective:   Physical Exam:  There were no vitals taken for this visit.  Physical Exam Constitutional:      General: She is not in acute distress. Musculoskeletal:        General: No deformity.  Neurological:     Mental Status: She is alert and oriented to person, place, and time.     Coordination: Coordination normal.  Psychiatric:        Attention and Perception: Attention and perception normal. She does not perceive auditory or visual hallucinations.        Mood and Affect: Mood normal. Mood is not anxious or  depressed. Affect is not labile, blunt, angry or inappropriate.        Speech: Speech normal.        Behavior: Behavior normal.        Thought Content: Thought content normal. Thought content is not paranoid or delusional. Thought content does not include homicidal or suicidal ideation. Thought content does not include homicidal or suicidal plan.        Cognition and Memory: Cognition and memory normal.        Judgment: Judgment normal.     Comments: Insight intact     Lab Review:     Component Value Date/Time   NA 137 05/12/2020 1357   K 3.7 05/12/2020 1357   CL 106 05/12/2020 1357   CO2 22 05/12/2020 1357   GLUCOSE 121 (H) 05/12/2020 1357   BUN 7 05/12/2020 1357   CREATININE 0.78 05/12/2020 1357   CREATININE 0.55 12/25/2013 1455   CALCIUM 8.9 05/12/2020 1357   PROT 7.6 05/11/2020 1604   ALBUMIN 3.9 05/11/2020 1604   AST 14 (L) 05/11/2020 1604   ALT 13 05/11/2020 1604   ALKPHOS 87 05/11/2020 1604   BILITOT 1.3 (H) 05/11/2020 1604   GFRNONAA >60 05/12/2020 1357   GFRAA >60 07/05/2019 1254       Component Value Date/Time   WBC 9.0 05/11/2020 1604   RBC 3.86 (L) 05/11/2020 1604   HGB 12.6 05/11/2020 1753   HCT 37.0 05/11/2020 1753   PLT 457 (H) 05/11/2020 1604   MCV 95.6  05/11/2020 1604   MCV 97.7 (A) 05/24/2013 2021   MCH 30.6 05/11/2020 1604   MCHC 32.0 05/11/2020 1604   RDW 12.9 05/11/2020 1604   LYMPHSABS 3.4 10/26/2017 0607   MONOABS 0.9 10/26/2017 0607   EOSABS 0.1 10/26/2017 0607   BASOSABS 0.1 10/26/2017 0607    No results found for: "POCLITH", "LITHIUM"   No results found for: "PHENYTOIN", "PHENOBARB", "VALPROATE", "CBMZ"   .res Assessment: Plan:    Plan:  Prozac 40mg  daily  Wellbutrin XL 300mg  daily - denies seizure history. Clonazepam 1mg  BID as needed Trazadone 100mg  - 1/2 to one tablet at bedtime.  D/C Adderall XR 30mg  BID - creating OCD problems and sometimes making her sleepy. Add Vyvanse 30mg  daily  RTC 4 weeks  Discussed potential benefits, risks, and side effects of stimulants with patient to include increased heart rate, palpitations, insomnia, increased anxiety, increased irritability, or decreased appetite.  Instructed patient to contact office if experiencing any significant tolerability issues.   Discussed potential benefits, risk, and side effects of benzodiazepines to include potential risk of tolerance and dependence, as well as possible drowsiness.  Advised patient not to drive if experiencing drowsiness and to take lowest possible effective dose to minimize risk of dependence and tolerance.  There are no diagnoses linked to this encounter.   Please see After Visit Summary for patient specific instructions.  Future Appointments  Date Time Provider Department Center  09/29/2022  2:30 PM Sherrie George, MD [facility] None    No orders of the defined types were placed in this encounter.   -------------------------------

## 2022-07-05 ENCOUNTER — Telehealth: Payer: Self-pay | Admitting: Adult Health

## 2022-07-05 ENCOUNTER — Other Ambulatory Visit: Payer: Self-pay | Admitting: Adult Health

## 2022-07-05 DIAGNOSIS — F411 Generalized anxiety disorder: Secondary | ICD-10-CM

## 2022-07-05 DIAGNOSIS — F988 Other specified behavioral and emotional disorders with onset usually occurring in childhood and adolescence: Secondary | ICD-10-CM

## 2022-07-05 MED ORDER — CLONAZEPAM 0.5 MG PO TABS: ORAL_TABLET | ORAL | 2 refills | Status: DC

## 2022-07-05 NOTE — Progress Notes (Signed)
Medication change 

## 2022-07-05 NOTE — Telephone Encounter (Signed)
Pt called at 3:15p.  She is unable to get Clonazepam 1mg  pills, but they do have 0.5mg .  she wants to know if you will write the prescription based on that instead.  Send to   [id number] [place], [place] - [phone] AT [facility] & [address] [place] [address] Phone: [phone]  Fax: [phone]   Next appt 6/3

## 2022-07-05 NOTE — Telephone Encounter (Signed)
Script sent  

## 2022-07-13 ENCOUNTER — Ambulatory Visit: Payer: BC Managed Care – PPO | Admitting: Adult Health

## 2022-08-09 ENCOUNTER — Telehealth (INDEPENDENT_AMBULATORY_CARE_PROVIDER_SITE_OTHER): Payer: BC Managed Care – PPO | Admitting: Adult Health

## 2022-08-09 ENCOUNTER — Encounter: Payer: Self-pay | Admitting: Adult Health

## 2022-08-09 DIAGNOSIS — F988 Other specified behavioral and emotional disorders with onset usually occurring in childhood and adolescence: Secondary | ICD-10-CM

## 2022-08-09 DIAGNOSIS — F411 Generalized anxiety disorder: Secondary | ICD-10-CM | POA: Diagnosis not present

## 2022-08-09 DIAGNOSIS — G4700 Insomnia, unspecified: Secondary | ICD-10-CM

## 2022-08-09 DIAGNOSIS — F331 Major depressive disorder, recurrent, moderate: Secondary | ICD-10-CM

## 2022-08-09 DIAGNOSIS — F410 Panic disorder [episodic paroxysmal anxiety] without agoraphobia: Secondary | ICD-10-CM

## 2022-08-09 MED ORDER — LISDEXAMFETAMINE DIMESYLATE 40 MG PO CAPS: 40.0000 mg | ORAL_CAPSULE | Freq: Every day | ORAL | 0 refills | Status: DC

## 2022-08-09 MED ORDER — LISDEXAMFETAMINE DIMESYLATE 40 MG PO CAPS: 40.0000 mg | ORAL_CAPSULE | ORAL | 0 refills | Status: DC

## 2022-08-09 NOTE — Progress Notes (Signed)
Theresa [name] [id number] 09/23/1990 32 y.o.  Virtual Visit via Video Note  I connected with pt @ on 08/09/22 at  5:00 PM EDT by a video enabled telemedicine application and verified that I am speaking with the correct person using two identifiers.   I discussed the limitations of evaluation and management by telemedicine and the availability of in person appointments. The patient expressed understanding and agreed to proceed.  I discussed the assessment and treatment plan with the patient. The patient was provided an opportunity to ask questions and all were answered. The patient agreed with the plan and demonstrated an understanding of the instructions.   The patient was advised to call back or seek an in-person evaluation if the symptoms worsen or if the condition fails to improve as anticipated.  I provided 25 minutes of non-face-to-face time during this encounter.  The patient was located at home.  The provider was located at [facility].   Theresa Gibbs, NP   Subjective:   Patient ID:  Theresa [name] is a 32 y.o. (DOB June 07, 1990) female.  Chief Complaint: No chief complaint on file.   HPI Theresa [name] presents for follow-up of panic attacks, anxiety, depression, ADHD, and insomnia.   Describes mood today as "ok". Pleasant. Decreased tearfulness. Mood symptoms - reports decreased depression, anxiety and irritability. Denies recent panic attacks. Reports worry, rumination, and over thinking. Denies obsessive thoughts and acts. Mood is better - more consistent. Stating "I feel like I'm doing better". Has started seeing a new pain management provider. Varying interest and motivation. Taking other medications as prescribed.  Energy levels improved with Vyvanse. Active, does not have a regular exercise routine.   Enjoys some usual interests and activities. Single. Lives with aunt and dog - "[place]". Family local.   Appetite varies. Weight stable - 205  pounds. Sleeps well most nights. Averages 6 to 8 hours on a good night - otherwise averages none or 4 hours. Reports focus and concentration difficulties - improved with Vyvanse. Completing tasks. Managing some aspects of household.  Denies SI or HI.  Denies AH or VH. Denies self harm. Denies substance use.  Previous medications: Adderall IR causes GERD.  Review of Systems:  Review of Systems  Musculoskeletal:  Negative for gait problem.  Neurological:  Negative for tremors.  Psychiatric/Behavioral:         Please refer to HPI    Medications: I have reviewed the patient's current medications.  Current Outpatient Medications  Medication Sig Dispense Refill   [START ON 09/06/2022] lisdexamfetamine (VYVANSE) 40 MG capsule Take 1 capsule (40 mg total) by mouth every morning. 30 capsule 0   B-COMPLEX-C PO Take 1 capsule by mouth daily.      buPROPion (WELLBUTRIN XL) 300 MG 24 hr tablet Take 1 tablet (300 mg total) by mouth daily. 30 tablet 5   cholecalciferol (VITAMIN D3) 25 MCG (1000 UNIT) tablet Take 1,000 Units by mouth daily.     clonazePAM (KLONOPIN) 0.5 MG tablet Take two tablets twice day. 120 tablet 2   CONTOUR NEXT TEST test strip 1 each by Other route See admin instructions. 7 times daily     dexlansoprazole (DEXILANT) 60 MG capsule Take 1 capsule by mouth daily.     diphenoxylate-atropine (LOMOTIL) 2.5-0.025 MG tablet Take 1 tablet by mouth 4 (four) times daily as needed.     etonogestrel (NEXPLANON) 68 MG IMPL implant 1 each by Subdermal route continuous.      famotidine (PEPCID) 40 MG  tablet Take 1 tablet by mouth daily.     fluconazole (DIFLUCAN) 200 MG tablet Take 200 mg by mouth every other day. (Patient not taking: Reported on 05/11/2020)     FLUoxetine (PROZAC) 40 MG capsule Take one capsule daily. 30 capsule 5   hydrOXYzine (ATARAX/VISTARIL) 10 MG tablet Take 10 mg by mouth every 8 (eight) hours as needed for itching.     insulin aspart (NOVOLOG) 100 UNIT/ML FlexPen  Inject 5-20 Units into the skin See admin instructions. Sliding scale: If blood sugar over 150, takes 5 units. Maximum 20 units.     insulin degludec (TRESIBA) 100 UNIT/ML FlexTouch Pen Inject 50 Units into the skin daily.     lisdexamfetamine (VYVANSE) 40 MG capsule Take 1 capsule (40 mg total) by mouth daily. 30 capsule 0   NARCAN 4 MG/0.1ML LIQD nasal spray kit Place 1 spray into the nose once. Overdose     ondansetron (ZOFRAN-ODT) 8 MG disintegrating tablet Take 8 mg by mouth 3 (three) times daily as needed.     OVER THE COUNTER MEDICATION Take 1 Dose by mouth daily as needed (nausea/vomiting).     Promethazine HCl 6.25 MG/5ML SOLN Take 5 mLs by mouth every 8 (eight) hours as needed (cough).     sucralfate (CARAFATE) 1 g tablet Take 1 g by mouth 4 (four) times daily.     traZODone (DESYREL) 100 MG tablet Take one 1/2 to one tablet at bedtime. 45 tablet 5   Turmeric 500 MG CAPS Take 500 mg by mouth daily.      No current facility-administered medications for this visit.    Medication Side Effects: None  Allergies:  Allergies  Allergen Reactions   Tapentadol Swelling and Rash   Aloe Hives   Duloxetine Other (See Comments)    RESTLESSNESS, URGES TO MOVE   Gabapentin Swelling    SWELLING REACTION OF FEET    Olive Oil Hives   Pregabalin Other (See Comments)    Heart palpitations, Fainting, chest pain, sweats   Metoclopramide Other (See Comments)   Zonisamide Other (See Comments)    WORSENING PAIN    Past Medical History:  Diagnosis Date   Fibromyalgia    Fibromyalgia 06/28/2019   GERD (gastroesophageal reflux disease)    Headache    Insomnia    Insulin dependent type 1 diabetes mellitus (HCC)    LGSIL of cervix of undetermined significance 10/2018   Colposcopy normal with ECC showing atypical fragments.  Recommend follow-up Pap smear/HPV at next annual exam.   Nausea & vomiting 04/08/2020   Peripheral neuropathy    Seasonal allergies    Traction retinal detachment     vitreou hemorrhage right eye    Family History  Problem Relation Age of Onset   Diabetes Mother    Migraines Mother    Hypertension Mother    CVA Mother    Breast cancer Maternal Grandmother    Thyroid disease Maternal Grandmother    Cancer Maternal Grandfather     Social History   Socioeconomic History   Marital status: Single    Spouse name: Not on file   Number of children: Not on file   Years of education: Not on file   Highest education level: Not on file  Occupational History   Not on file  Tobacco Use   Smoking status: Never   Smokeless tobacco: Never  Vaping Use   Vaping Use: Never used  Substance and Sexual Activity   Alcohol use: No  Alcohol/week: 0.0 standard drinks of alcohol   Drug use: No   Sexual activity: Yes    Birth control/protection: Implant    Comment: 1ST INTERCOURSE- 27, PARTNERS - 6 . Nexplanon 06/27/2018  Other Topics Concern   Not on file  Social History Narrative   Student.  Not married.  Lives alone in a one story home.   Not working.   Social Determinants of Health   Financial Resource Strain: Not on file  Food Insecurity: Not on file  Transportation Needs: Not on file  Physical Activity: Not on file  Stress: Not on file  Social Connections: Not on file  Intimate Partner Violence: Not on file    Past Medical History, Surgical history, Social history, and Family history were reviewed and updated as appropriate.   Please see review of systems for further details on the patient's review from today.   Objective:   Physical Exam:  There were no vitals taken for this visit.  Physical Exam Constitutional:      General: She is not in acute distress. Musculoskeletal:        General: No deformity.  Neurological:     Mental Status: She is alert and oriented to person, place, and time.     Coordination: Coordination normal.  Psychiatric:        Attention and Perception: Attention and perception normal. She does not perceive  auditory or visual hallucinations.        Mood and Affect: Mood normal. Mood is not anxious or depressed. Affect is not labile, blunt, angry or inappropriate.        Speech: Speech normal.        Behavior: Behavior normal.        Thought Content: Thought content normal. Thought content is not paranoid or delusional. Thought content does not include homicidal or suicidal ideation. Thought content does not include homicidal or suicidal plan.        Cognition and Memory: Cognition and memory normal.        Judgment: Judgment normal.     Comments: Insight intact     Lab Review:     Component Value Date/Time   NA 137 05/12/2020 1357   K 3.7 05/12/2020 1357   CL 106 05/12/2020 1357   CO2 22 05/12/2020 1357   GLUCOSE 121 (H) 05/12/2020 1357   BUN 7 05/12/2020 1357   CREATININE 0.78 05/12/2020 1357   CREATININE 0.55 12/25/2013 1455   CALCIUM 8.9 05/12/2020 1357   PROT 7.6 05/11/2020 1604   ALBUMIN 3.9 05/11/2020 1604   AST 14 (L) 05/11/2020 1604   ALT 13 05/11/2020 1604   ALKPHOS 87 05/11/2020 1604   BILITOT 1.3 (H) 05/11/2020 1604   GFRNONAA >60 05/12/2020 1357   GFRAA >60 07/05/2019 1254       Component Value Date/Time   WBC 9.0 05/11/2020 1604   RBC 3.86 (L) 05/11/2020 1604   HGB 12.6 05/11/2020 1753   HCT 37.0 05/11/2020 1753   PLT 457 (H) 05/11/2020 1604   MCV 95.6 05/11/2020 1604   MCV 97.7 (A) 05/24/2013 2021   MCH 30.6 05/11/2020 1604   MCHC 32.0 05/11/2020 1604   RDW 12.9 05/11/2020 1604   LYMPHSABS 3.4 10/26/2017 0607   MONOABS 0.9 10/26/2017 0607   EOSABS 0.1 10/26/2017 0607   BASOSABS 0.1 10/26/2017 0607    No results found for: "POCLITH", "LITHIUM"   No results found for: "PHENYTOIN", "PHENOBARB", "VALPROATE", "CBMZ"   .res Assessment: Plan:    Plan:  Prozac 40mg  daily  Wellbutrin XL 300mg  daily - denies seizure history. Clonazepam 1mg  BID as needed Trazadone 100mg  - 1/2 to one tablet at bedtime.  Vyvanse 30mg  to 40mg  daily - 2 scripts  sent.  RTC 8 weeks  Discussed potential benefits, risks, and side effects of stimulants with patient to include increased heart rate, palpitations, insomnia, increased anxiety, increased irritability, or decreased appetite.  Instructed patient to contact office if experiencing any significant tolerability issues.   Discussed potential benefits, risk, and side effects of benzodiazepines to include potential risk of tolerance and dependence, as well as possible drowsiness.  Advised patient not to drive if experiencing drowsiness and to take lowest possible effective dose to minimize risk of dependence and tolerance.  Diagnoses and all orders for this visit:  Major depressive disorder, recurrent episode, moderate (HCC)  Attention deficit disorder, unspecified hyperactivity presence -     lisdexamfetamine (VYVANSE) 40 MG capsule; Take 1 capsule (40 mg total) by mouth daily. -     lisdexamfetamine (VYVANSE) 40 MG capsule; Take 1 capsule (40 mg total) by mouth every morning.  Generalized anxiety disorder  Panic attacks  Insomnia, unspecified type     Please see After Visit Summary for patient specific instructions.  Future Appointments  Date Time Provider Department Center  09/29/2022  2:30 PM Sherrie George, MD [facility] None    No orders of the defined types were placed in this encounter.     -------------------------------

## 2022-09-28 ENCOUNTER — Encounter (INDEPENDENT_AMBULATORY_CARE_PROVIDER_SITE_OTHER): Payer: BC Managed Care – PPO | Admitting: Ophthalmology

## 2022-09-29 ENCOUNTER — Encounter (INDEPENDENT_AMBULATORY_CARE_PROVIDER_SITE_OTHER): Payer: BC Managed Care – PPO | Admitting: Ophthalmology

## 2022-10-14 ENCOUNTER — Ambulatory Visit (INDEPENDENT_AMBULATORY_CARE_PROVIDER_SITE_OTHER): Payer: BC Managed Care – PPO | Admitting: Adult Health

## 2022-10-14 ENCOUNTER — Encounter: Payer: Self-pay | Admitting: Adult Health

## 2022-10-14 DIAGNOSIS — F419 Anxiety disorder, unspecified: Secondary | ICD-10-CM

## 2022-10-14 DIAGNOSIS — F988 Other specified behavioral and emotional disorders with onset usually occurring in childhood and adolescence: Secondary | ICD-10-CM

## 2022-10-14 DIAGNOSIS — F32A Depression, unspecified: Secondary | ICD-10-CM

## 2022-10-14 DIAGNOSIS — G4700 Insomnia, unspecified: Secondary | ICD-10-CM | POA: Diagnosis not present

## 2022-10-14 DIAGNOSIS — F410 Panic disorder [episodic paroxysmal anxiety] without agoraphobia: Secondary | ICD-10-CM

## 2022-10-14 DIAGNOSIS — F331 Major depressive disorder, recurrent, moderate: Secondary | ICD-10-CM

## 2022-10-14 DIAGNOSIS — F411 Generalized anxiety disorder: Secondary | ICD-10-CM

## 2022-10-14 MED ORDER — LISDEXAMFETAMINE DIMESYLATE 40 MG PO CAPS
40.0000 mg | ORAL_CAPSULE | Freq: Every day | ORAL | 0 refills | Status: AC
Start: 2022-10-14 — End: ?

## 2022-10-14 MED ORDER — CLONAZEPAM 1 MG PO TABS
1.0000 mg | ORAL_TABLET | Freq: Two times a day (BID) | ORAL | 0 refills | Status: AC
Start: 2022-10-14 — End: ?

## 2022-10-14 NOTE — Progress Notes (Signed)
Theresa [name] [id number] Jul 26, 1990 32 y.o.  Virtual Visit via Telephone Note  I connected with pt on 10/14/22 at  4:20 PM EDT by telephone and verified that I am speaking with the correct person using two identifiers.   I discussed the limitations, risks, security and privacy concerns of performing an evaluation and management service by telephone and the availability of in person appointments. I also discussed with the patient that there may be a patient responsible charge related to this service. The patient expressed understanding and agreed to proceed.   I discussed the assessment and treatment plan with the patient. The patient was provided an opportunity to ask questions and all were answered. The patient agreed with the plan and demonstrated an understanding of the instructions.   The patient was advised to call back or seek an in-person evaluation if the symptoms worsen or if the condition fails to improve as anticipated.  I provided 25 minutes of non-face-to-face time during this encounter.  The patient was located at home.  The provider was located at [facility].   Dorothyann Gibbs, NP   Subjective:   Patient ID:  Kathey Spicer is a 32 y.o. (DOB 12/05/1990) female.  Chief Complaint: No chief complaint on file.   HPI Venise Lavallie presents for follow-up of panic attacks, anxiety, depression, ADHD, and insomnia.   Describes mood today as "ok". Pleasant. Decreased tearfulness. Mood symptoms - reports depression, anxiety and irritability. Denies recent panic attacks - more anxiety attacks. Reports worry, rumination, and over thinking. Denies obsessive thoughts and acts. Mood is consistent. Stating "I feel like I'm doing better". Has started seeing a new pain management provider. Varying interest and motivation. Taking other medications as prescribed.  Energy levels improved with Vyvanse. Active, does not have a regular exercise routine.   Enjoys some  usual interests and activities. Single. Lives with aunt and dog - "[place]". Family local.   Appetite varies. Weight gain - 212 pounds. Sleeps better some nights than others. Reports difficulties getting to and staying asleep. Reports 3 nights of better sleep and other nights 4 to 5 hours of broken sleep. Reports focus and concentration - improved with Vyvanse. Completing tasks. Managing some aspects of household.  Denies SI or HI.  Denies AH or VH. Denies self harm. Denies substance use.   Review of Systems:  Review of Systems  Musculoskeletal:  Negative for gait problem.  Neurological:  Negative for tremors.  Psychiatric/Behavioral:         Please refer to HPI    Medications: I have reviewed the patient's current medications.  Current Outpatient Medications  Medication Sig Dispense Refill   B-COMPLEX-C PO Take 1 capsule by mouth daily.      buPROPion (WELLBUTRIN XL) 300 MG 24 hr tablet Take 1 tablet (300 mg total) by mouth daily. 30 tablet 5   cholecalciferol (VITAMIN D3) 25 MCG (1000 UNIT) tablet Take 1,000 Units by mouth daily.     clonazePAM (KLONOPIN) 0.5 MG tablet Take two tablets twice day. 120 tablet 2   CONTOUR NEXT TEST test strip 1 each by Other route See admin instructions. 7 times daily     dexlansoprazole (DEXILANT) 60 MG capsule Take 1 capsule by mouth daily.     diphenoxylate-atropine (LOMOTIL) 2.5-0.025 MG tablet Take 1 tablet by mouth 4 (four) times daily as needed.     etonogestrel (NEXPLANON) 68 MG IMPL implant 1 each by Subdermal route continuous.      famotidine (PEPCID) 40  MG tablet Take 1 tablet by mouth daily.     fluconazole (DIFLUCAN) 200 MG tablet Take 200 mg by mouth every other day. (Patient not taking: Reported on 05/11/2020)     FLUoxetine (PROZAC) 40 MG capsule Take one capsule daily. 30 capsule 5   hydrOXYzine (ATARAX/VISTARIL) 10 MG tablet Take 10 mg by mouth every 8 (eight) hours as needed for itching.     insulin aspart (NOVOLOG) 100 UNIT/ML  FlexPen Inject 5-20 Units into the skin See admin instructions. Sliding scale: If blood sugar over 150, takes 5 units. Maximum 20 units.     insulin degludec (TRESIBA) 100 UNIT/ML FlexTouch Pen Inject 50 Units into the skin daily.     lisdexamfetamine (VYVANSE) 40 MG capsule Take 1 capsule (40 mg total) by mouth daily. 30 capsule 0   lisdexamfetamine (VYVANSE) 40 MG capsule Take 1 capsule (40 mg total) by mouth every morning. 30 capsule 0   NARCAN 4 MG/0.1ML LIQD nasal spray kit Place 1 spray into the nose once. Overdose     ondansetron (ZOFRAN-ODT) 8 MG disintegrating tablet Take 8 mg by mouth 3 (three) times daily as needed.     OVER THE COUNTER MEDICATION Take 1 Dose by mouth daily as needed (nausea/vomiting).     Promethazine HCl 6.25 MG/5ML SOLN Take 5 mLs by mouth every 8 (eight) hours as needed (cough).     sucralfate (CARAFATE) 1 g tablet Take 1 g by mouth 4 (four) times daily.     traZODone (DESYREL) 100 MG tablet Take one 1/2 to one tablet at bedtime. 45 tablet 5   Turmeric 500 MG CAPS Take 500 mg by mouth daily.      No current facility-administered medications for this visit.    Medication Side Effects: None  Allergies:  Allergies  Allergen Reactions   Tapentadol Swelling and Rash   Aloe Hives   Duloxetine Other (See Comments)    RESTLESSNESS, URGES TO MOVE   Gabapentin Swelling    SWELLING REACTION OF FEET    Olive Oil Hives   Pregabalin Other (See Comments)    Heart palpitations, Fainting, chest pain, sweats   Metoclopramide Other (See Comments)   Zonisamide Other (See Comments)    WORSENING PAIN    Past Medical History:  Diagnosis Date   Fibromyalgia    Fibromyalgia 06/28/2019   GERD (gastroesophageal reflux disease)    Headache    Insomnia    Insulin dependent type 1 diabetes mellitus (HCC)    LGSIL of cervix of undetermined significance 10/2018   Colposcopy normal with ECC showing atypical fragments.  Recommend follow-up Pap smear/HPV at next annual exam.    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    Family History  Problem Relation Age of Onset   Diabetes Mother    Migraines Mother    Hypertension Mother    CVA Mother    Breast cancer Maternal Grandmother    Thyroid disease Maternal Grandmother    Cancer Maternal Grandfather     Social History   Socioeconomic History   Marital status: Single    Spouse name: Not on file   Number of children: Not on file   Years of education: Not on file   Highest education level: Not on file  Occupational History   Not on file  Tobacco Use   Smoking status: Never   Smokeless tobacco: Never  Vaping Use   Vaping status: Never Used  Substance and Sexual Activity   Alcohol use: No    Alcohol/week: 0.0 standard drinks of alcohol   Drug use: No   Sexual activity: Yes    Birth control/protection: Implant    Comment: 1ST INTERCOURSE- 63, PARTNERS - 7 . Nexplanon 06/27/2018  Other Topics Concern   Not on file  Social History Narrative   Student.  Not married.  Lives alone in a one story home.   Not working.   Social Determinants of Health   Financial Resource Strain: Not on file  Food Insecurity: Not on file  Transportation Needs: Not on file  Physical Activity: Not on file  Stress: Not on file  Social Connections: Not on file  Intimate Partner Violence: Not on file    Past Medical History, Surgical history, Social history, and Family history were reviewed and updated as appropriate.   Please see review of systems for further details on the patient's review from today.   Objective:   Physical Exam:  There were no vitals taken for this visit.  Physical Exam Constitutional:      General: She is not in acute distress. Musculoskeletal:        General: No deformity.  Neurological:     Mental Status: She is alert and oriented to person, place, and time.     Coordination:  Coordination normal.  Psychiatric:        Attention and Perception: Attention and perception normal. She does not perceive auditory or visual hallucinations.        Mood and Affect: Affect is not labile, blunt, angry or inappropriate.        Speech: Speech normal.        Behavior: Behavior normal.        Thought Content: Thought content normal. Thought content is not paranoid or delusional. Thought content does not include homicidal or suicidal ideation. Thought content does not include homicidal or suicidal plan.        Cognition and Memory: Cognition and memory normal.        Judgment: Judgment normal.     Comments: Insight intact     Lab Review:     Component Value Date/Time   NA 137 05/12/2020 1357   K 3.7 05/12/2020 1357   CL 106 05/12/2020 1357   CO2 22 05/12/2020 1357   GLUCOSE 121 (H) 05/12/2020 1357   BUN 7 05/12/2020 1357   CREATININE 0.78 05/12/2020 1357   CREATININE 0.55 12/25/2013 1455   CALCIUM 8.9 05/12/2020 1357   PROT 7.6 05/11/2020 1604   ALBUMIN 3.9 05/11/2020 1604   AST 14 (L) 05/11/2020 1604   ALT 13 05/11/2020 1604   ALKPHOS 87 05/11/2020 1604   BILITOT 1.3 (H) 05/11/2020 1604   GFRNONAA >60 05/12/2020 1357   GFRAA >60 07/05/2019 1254       Component Value Date/Time   WBC 9.0 05/11/2020 1604   RBC 3.86 (L) 05/11/2020 1604   HGB 12.6 05/11/2020 1753   HCT 37.0 05/11/2020 1753   PLT 457 (H) 05/11/2020 1604   MCV 95.6 05/11/2020 1604   MCV 97.7 (A) 05/24/2013 2021   MCH 30.6 05/11/2020 1604   MCHC 32.0 05/11/2020 1604   RDW 12.9 05/11/2020 1604   LYMPHSABS 3.4 10/26/2017 0607   MONOABS 0.9 10/26/2017 0607   EOSABS 0.1 10/26/2017 0607   BASOSABS 0.1 10/26/2017 0607    No results found for: "POCLITH", "LITHIUM"   No results found for: "  PHENYTOIN", "PHENOBARB", "VALPROATE", "CBMZ"   .res Assessment: Plan:    Plan:  Prozac 40mg  daily  Wellbutrin XL 300mg  daily - denies seizure history. Clonazepam 1mg  BID as needed Trazadone 100mg  - 1/2  to one tablet at bedtime.  Vyvanse 40mg  daily  RTC 8 weeks  Discussed potential benefits, risks, and side effects of stimulants with patient to include increased heart rate, palpitations, insomnia, increased anxiety, increased irritability, or decreased appetite.  Instructed patient to contact office if experiencing any significant tolerability issues.   Discussed potential benefits, risk, and side effects of benzodiazepines to include potential risk of tolerance and dependence, as well as possible drowsiness.  Advised patient not to drive if experiencing drowsiness and to take lowest possible effective dose to minimize risk of dependence and tolerance.  There are no diagnoses linked to this encounter.  Please see After Visit Summary for patient specific instructions.  No future appointments.  No orders of the defined types were placed in this encounter.     -------------------------------

## 2022-11-24 ENCOUNTER — Other Ambulatory Visit: Payer: Self-pay | Admitting: Adult Health

## 2022-11-24 DIAGNOSIS — F331 Major depressive disorder, recurrent, moderate: Secondary | ICD-10-CM

## 2022-12-07 ENCOUNTER — Telehealth: Payer: BC Managed Care – PPO | Admitting: Adult Health

## 2022-12-07 ENCOUNTER — Encounter: Payer: Self-pay | Admitting: Adult Health

## 2022-12-07 DIAGNOSIS — F331 Major depressive disorder, recurrent, moderate: Secondary | ICD-10-CM | POA: Diagnosis not present

## 2022-12-07 DIAGNOSIS — F908 Attention-deficit hyperactivity disorder, other type: Secondary | ICD-10-CM | POA: Diagnosis not present

## 2022-12-07 DIAGNOSIS — F411 Generalized anxiety disorder: Secondary | ICD-10-CM | POA: Diagnosis not present

## 2022-12-07 DIAGNOSIS — F902 Attention-deficit hyperactivity disorder, combined type: Secondary | ICD-10-CM | POA: Diagnosis not present

## 2022-12-07 MED ORDER — LISDEXAMFETAMINE DIMESYLATE 40 MG PO CAPS: 40.0000 mg | ORAL_CAPSULE | Freq: Every day | ORAL | 0 refills | Status: DC

## 2022-12-07 MED ORDER — BUPROPION HCL ER (XL) 300 MG PO TB24
300.0000 mg | ORAL_TABLET | Freq: Every day | ORAL | 5 refills | Status: DC
Start: 2022-12-07 — End: 2023-08-03

## 2022-12-07 MED ORDER — ARIPIPRAZOLE 2 MG PO TABS
2.0000 mg | ORAL_TABLET | Freq: Every day | ORAL | 2 refills | Status: DC
Start: 2022-12-07 — End: 2023-01-25

## 2022-12-07 MED ORDER — LISDEXAMFETAMINE DIMESYLATE 40 MG PO CAPS
40.0000 mg | ORAL_CAPSULE | ORAL | 0 refills | Status: DC
Start: 2022-12-07 — End: 2023-01-25

## 2022-12-07 MED ORDER — CLONAZEPAM 1 MG PO TABS
1.0000 mg | ORAL_TABLET | Freq: Two times a day (BID) | ORAL | 2 refills | Status: DC
Start: 2022-12-07 — End: 2023-04-07

## 2022-12-07 NOTE — Progress Notes (Signed)
Theresa [name] [id number] May 18, 1990 32 y.o.  Virtual Visit via Video Note  I connected with pt @ on 12/07/22 at  5:00 PM EDT by a video enabled telemedicine application and verified that I am speaking with the correct person using two identifiers.   I discussed the limitations of evaluation and management by telemedicine and the availability of in person appointments. The patient expressed understanding and agreed to proceed.  I discussed the assessment and treatment plan with the patient. The patient was provided an opportunity to ask questions and all were answered. The patient agreed with the plan and demonstrated an understanding of the instructions.   The patient was advised to call back or seek an in-person evaluation if the symptoms worsen or if the condition fails to improve as anticipated.  I provided 25 minutes of non-face-to-face time during this encounter.  The patient was located at home.  The provider was located at [facility].   Theresa Gibbs, NP   Subjective:   Patient ID:  Theresa [name] is a 32 y.o. (DOB 06-24-1990) female.  Chief Complaint: No chief complaint on file.   HPI Theresa [name] presents for follow-up of panic attacks, anxiety, depression, ADHD, and insomnia.   Describes mood today as "ok". Pleasant. Decreased tearfulness. Mood symptoms - reports depression, anxiety and irritability. Reports recent panic attacks. Reports worry, rumination, and over thinking. Denies obsessive thoughts and acts. Reports seasonal changes are contributing to a lower mood. Stating "I'm trying to figure out how to navigate through things". Working with pain management provider. Has an upcoming appointment with a new PCP. Varying interest and motivation. Taking other medications as prescribed.  Energy levels decreased. Active, does not have a regular exercise routine.   Enjoys some usual interests and activities. Single. Lives with aunt and dog -  "[place]". Family local.   Appetite varies. Weight gain - 212 pounds. Sleeps better some nights than others. Reports difficulties getting to and staying asleep - does not have a consistent sleep pattern. Reports focus and concentration improved with Vyvanse. Completing tasks. Managing some aspects of household.  Denies SI or HI.  Denies AH or VH. Denies self harm. Denies substance use.  Review of Systems:  Review of Systems  Musculoskeletal:  Negative for gait problem.  Neurological:  Negative for tremors.  Psychiatric/Behavioral:         Please refer to HPI    Medications: I have reviewed the patient's current medications.  Current Outpatient Medications  Medication Sig Dispense Refill   ARIPiprazole (ABILIFY) 2 MG tablet Take 1 tablet (2 mg total) by mouth daily. 30 tablet 2   B-COMPLEX-C PO Take 1 capsule by mouth daily.      buPROPion (WELLBUTRIN XL) 300 MG 24 hr tablet Take 1 tablet (300 mg total) by mouth daily. 30 tablet 5   cholecalciferol (VITAMIN D3) 25 MCG (1000 UNIT) tablet Take 1,000 Units by mouth daily.     clonazePAM (KLONOPIN) 1 MG tablet Take 1 tablet (1 mg total) by mouth 2 (two) times daily. 30 tablet 2   CONTOUR NEXT TEST test strip 1 each by Other route See admin instructions. 7 times daily     dexlansoprazole (DEXILANT) 60 MG capsule Take 1 capsule by mouth daily.     diphenoxylate-atropine (LOMOTIL) 2.5-0.025 MG tablet Take 1 tablet by mouth 4 (four) times daily as needed.     etonogestrel (NEXPLANON) 68 MG IMPL implant 1 each by Subdermal route continuous.  famotidine (PEPCID) 40 MG tablet Take 1 tablet by mouth daily.     fluconazole (DIFLUCAN) 200 MG tablet Take 200 mg by mouth every other day. (Patient not taking: Reported on 05/11/2020)     FLUoxetine (PROZAC) 40 MG capsule TAKE 1 CAPSULE BY MOUTH DAILY 30 capsule 5   hydrOXYzine (ATARAX/VISTARIL) 10 MG tablet Take 10 mg by mouth every 8 (eight) hours as needed for itching.     insulin aspart (NOVOLOG)  100 UNIT/ML FlexPen Inject 5-20 Units into the skin See admin instructions. Sliding scale: If blood sugar over 150, takes 5 units. Maximum 20 units.     insulin degludec (TRESIBA) 100 UNIT/ML FlexTouch Pen Inject 50 Units into the skin daily.     lisdexamfetamine (VYVANSE) 40 MG capsule Take 1 capsule (40 mg total) by mouth every morning. 30 capsule 0   [START ON 01/04/2023] lisdexamfetamine (VYVANSE) 40 MG capsule Take 1 capsule (40 mg total) by mouth daily. 30 capsule 0   NARCAN 4 MG/0.1ML LIQD nasal spray kit Place 1 spray into the nose once. Overdose     ondansetron (ZOFRAN-ODT) 8 MG disintegrating tablet Take 8 mg by mouth 3 (three) times daily as needed.     OVER THE COUNTER MEDICATION Take 1 Dose by mouth daily as needed (nausea/vomiting).     Promethazine HCl 6.25 MG/5ML SOLN Take 5 mLs by mouth every 8 (eight) hours as needed (cough).     sucralfate (CARAFATE) 1 g tablet Take 1 g by mouth 4 (four) times daily.     traZODone (DESYREL) 100 MG tablet Take one 1/2 to one tablet at bedtime. 45 tablet 5   Turmeric 500 MG CAPS Take 500 mg by mouth daily.      No current facility-administered medications for this visit.    Medication Side Effects: None  Allergies:  Allergies  Allergen Reactions   Tapentadol Swelling and Rash   Aloe Hives   Duloxetine Other (See Comments)    RESTLESSNESS, URGES TO MOVE   Gabapentin Swelling    SWELLING REACTION OF FEET    Olive Oil Hives   Pregabalin Other (See Comments)    Heart palpitations, Fainting, chest pain, sweats   Metoclopramide Other (See Comments)   Zonisamide Other (See Comments)    WORSENING PAIN    Past Medical History:  Diagnosis Date   Fibromyalgia    Fibromyalgia 06/28/2019   GERD (gastroesophageal reflux disease)    Headache    Insomnia    Insulin dependent type 1 diabetes mellitus (HCC)    LGSIL of cervix of undetermined significance 10/2018   Colposcopy normal with ECC showing atypical fragments.  Recommend follow-up  Pap smear/HPV at next annual exam.   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    Family History  Problem Relation Age of Onset   Diabetes Mother    Migraines Mother    Hypertension Mother    CVA Mother    Breast cancer Maternal Grandmother    Thyroid disease Maternal Grandmother    Cancer Maternal Grandfather     Social History   Socioeconomic History   Marital status: Single    Spouse name: Not on file   Number of children: Not on file   Years of education: Not on file   Highest education level: Not on file  Occupational History   Not on file  Tobacco Use   Smoking  status: Never   Smokeless tobacco: Never  Vaping Use   Vaping status: Never Used  Substance and Sexual Activity   Alcohol use: No    Alcohol/week: 0.0 standard drinks of alcohol   Drug use: No   Sexual activity: Yes    Birth control/protection: Implant    Comment: 1ST INTERCOURSE- 21, PARTNERS - 6 . Nexplanon 06/27/2018  Other Topics Concern   Not on file  Social History Narrative   Student.  Not married.  Lives alone in a one story home.   Not working.   Social Determinants of Health   Financial Resource Strain: Not on file  Food Insecurity: Not on file  Transportation Needs: Not on file  Physical Activity: Not on file  Stress: Not on file  Social Connections: Not on file  Intimate Partner Violence: Not on file    Past Medical History, Surgical history, Social history, and Family history were reviewed and updated as appropriate.   Please see review of systems for further details on the patient's review from today.   Objective:   Physical Exam:  There were no vitals taken for this visit.  Physical Exam Constitutional:      General: She is not in acute distress. Musculoskeletal:        General: No deformity.  Neurological:     Mental Status: She is alert and oriented to person, place, and  time.     Coordination: Coordination normal.  Psychiatric:        Attention and Perception: Attention and perception normal. She does not perceive auditory or visual hallucinations.        Mood and Affect: Affect is not labile, blunt, angry or inappropriate.        Speech: Speech normal.        Behavior: Behavior normal.        Thought Content: Thought content normal. Thought content is not paranoid or delusional. Thought content does not include homicidal or suicidal ideation. Thought content does not include homicidal or suicidal plan.        Cognition and Memory: Cognition and memory normal.        Judgment: Judgment normal.     Comments: Insight intact     Lab Review:     Component Value Date/Time   NA 137 05/12/2020 1357   K 3.7 05/12/2020 1357   CL 106 05/12/2020 1357   CO2 22 05/12/2020 1357   GLUCOSE 121 (H) 05/12/2020 1357   BUN 7 05/12/2020 1357   CREATININE 0.78 05/12/2020 1357   CREATININE 0.55 12/25/2013 1455   CALCIUM 8.9 05/12/2020 1357   PROT 7.6 05/11/2020 1604   ALBUMIN 3.9 05/11/2020 1604   AST 14 (L) 05/11/2020 1604   ALT 13 05/11/2020 1604   ALKPHOS 87 05/11/2020 1604   BILITOT 1.3 (H) 05/11/2020 1604   GFRNONAA >60 05/12/2020 1357   GFRAA >60 07/05/2019 1254       Component Value Date/Time   WBC 9.0 05/11/2020 1604   RBC 3.86 (L) 05/11/2020 1604   HGB 12.6 05/11/2020 1753   HCT 37.0 05/11/2020 1753   PLT 457 (H) 05/11/2020 1604   MCV 95.6 05/11/2020 1604   MCV 97.7 (A) 05/24/2013 2021   MCH 30.6 05/11/2020 1604   MCHC 32.0 05/11/2020 1604   RDW 12.9 05/11/2020 1604   LYMPHSABS 3.4 10/26/2017 0607   MONOABS 0.9 10/26/2017 0607   EOSABS 0.1 10/26/2017 0607   BASOSABS 0.1 10/26/2017 0607    No results found for: "  POCLITH", "LITHIUM"   No results found for: "PHENYTOIN", "PHENOBARB", "VALPROATE", "CBMZ"   .res Assessment: Plan:    Plan:  Add Abilify 2mg  daily  Prozac 40mg  daily  Wellbutrin XL 300mg  daily - denies seizure  history. Clonazepam 1mg  BID as needed Trazadone 100mg  - 1/2 to one tablet at bedtime.  Vyvanse 40mg  daily  RTC 4 weeks  Discussed potential benefits, risks, and side effects of stimulants with patient to include increased heart rate, palpitations, insomnia, increased anxiety, increased irritability, or decreased appetite.  Instructed patient to contact office if experiencing any significant tolerability issues.   Discussed potential benefits, risk, and side effects of benzodiazepines to include potential risk of tolerance and dependence, as well as possible drowsiness.  Advised patient not to drive if experiencing drowsiness and to take lowest possible effective dose to minimize risk of dependence and tolerance.  Diagnoses and all orders for this visit:  Major depressive disorder, recurrent episode, moderate (HCC) -     buPROPion (WELLBUTRIN XL) 300 MG 24 hr tablet; Take 1 tablet (300 mg total) by mouth daily. -     ARIPiprazole (ABILIFY) 2 MG tablet; Take 1 tablet (2 mg total) by mouth daily.  Generalized anxiety disorder -     buPROPion (WELLBUTRIN XL) 300 MG 24 hr tablet; Take 1 tablet (300 mg total) by mouth daily. -     clonazePAM (KLONOPIN) 1 MG tablet; Take 1 tablet (1 mg total) by mouth 2 (two) times daily.  Other specified attention deficit hyperactivity disorder (ADHD) -     buPROPion (WELLBUTRIN XL) 300 MG 24 hr tablet; Take 1 tablet (300 mg total) by mouth daily.  Attention deficit hyperactivity disorder (ADHD), combined type -     lisdexamfetamine (VYVANSE) 40 MG capsule; Take 1 capsule (40 mg total) by mouth every morning. -     lisdexamfetamine (VYVANSE) 40 MG capsule; Take 1 capsule (40 mg total) by mouth daily.     Please see After Visit Summary for patient specific instructions.  No future appointments.   No orders of the defined types were placed in this encounter.     -------------------------------

## 2023-01-04 ENCOUNTER — Telehealth (INDEPENDENT_AMBULATORY_CARE_PROVIDER_SITE_OTHER): Payer: Self-pay | Admitting: Adult Health

## 2023-01-04 DIAGNOSIS — Z0389 Encounter for observation for other suspected diseases and conditions ruled out: Secondary | ICD-10-CM

## 2023-01-04 NOTE — Progress Notes (Signed)
Patient no show appointment. ? ?

## 2023-01-04 NOTE — Progress Notes (Deleted)
Theresa [name] [id number] Jan 12, 1991 32 y.o.  Virtual Visit via Video Note  I connected with pt @ on 01/04/23 at  4:40 PM EDT by a video enabled telemedicine application and verified that I am speaking with the correct person using two identifiers.   I discussed the limitations of evaluation and management by telemedicine and the availability of in person appointments. The patient expressed understanding and agreed to proceed.  I discussed the assessment and treatment plan with the patient. The patient was provided an opportunity to ask questions and all were answered. The patient agreed with the plan and demonstrated an understanding of the instructions.   The patient was advised to call back or seek an in-person evaluation if the symptoms worsen or if the condition fails to improve as anticipated.  I provided *** minutes of non-face-to-face time during this encounter.  The patient was located at home.  The provider was located at [facility].   Theresa Gibbs, NP   Subjective:   Patient ID:  Theresa [name] is a 32 y.o. (DOB 1990-03-13) female.  Chief Complaint: No chief complaint on file.   HPI Theresa [name] presents for follow-up of ***   Review of Systems:  Review of Systems  Medications: {medication reviewed/display:3041432}  Current Outpatient Medications  Medication Sig Dispense Refill   ARIPiprazole (ABILIFY) 2 MG tablet Take 1 tablet (2 mg total) by mouth daily. 30 tablet 2   B-COMPLEX-C PO Take 1 capsule by mouth daily.      buPROPion (WELLBUTRIN XL) 300 MG 24 hr tablet Take 1 tablet (300 mg total) by mouth daily. 30 tablet 5   cholecalciferol (VITAMIN D3) 25 MCG (1000 UNIT) tablet Take 1,000 Units by mouth daily.     clonazePAM (KLONOPIN) 1 MG tablet Take 1 tablet (1 mg total) by mouth 2 (two) times daily. 30 tablet 2   CONTOUR NEXT TEST test strip 1 each by Other route See admin instructions. 7 times daily     dexlansoprazole (DEXILANT)  60 MG capsule Take 1 capsule by mouth daily.     diphenoxylate-atropine (LOMOTIL) 2.5-0.025 MG tablet Take 1 tablet by mouth 4 (four) times daily as needed.     etonogestrel (NEXPLANON) 68 MG IMPL implant 1 each by Subdermal route continuous.      famotidine (PEPCID) 40 MG tablet Take 1 tablet by mouth daily.     fluconazole (DIFLUCAN) 200 MG tablet Take 200 mg by mouth every other day. (Patient not taking: Reported on 05/11/2020)     FLUoxetine (PROZAC) 40 MG capsule TAKE 1 CAPSULE BY MOUTH DAILY 30 capsule 5   hydrOXYzine (ATARAX/VISTARIL) 10 MG tablet Take 10 mg by mouth every 8 (eight) hours as needed for itching.     insulin aspart (NOVOLOG) 100 UNIT/ML FlexPen Inject 5-20 Units into the skin See admin instructions. Sliding scale: If blood sugar over 150, takes 5 units. Maximum 20 units.     insulin degludec (TRESIBA) 100 UNIT/ML FlexTouch Pen Inject 50 Units into the skin daily.     lisdexamfetamine (VYVANSE) 40 MG capsule Take 1 capsule (40 mg total) by mouth every morning. 30 capsule 0   lisdexamfetamine (VYVANSE) 40 MG capsule Take 1 capsule (40 mg total) by mouth daily. 30 capsule 0   NARCAN 4 MG/0.1ML LIQD nasal spray kit Place 1 spray into the nose once. Overdose     ondansetron (ZOFRAN-ODT) 8 MG disintegrating tablet Take 8 mg by mouth 3 (three) times daily as needed.  OVER THE COUNTER MEDICATION Take 1 Dose by mouth daily as needed (nausea/vomiting).     Promethazine HCl 6.25 MG/5ML SOLN Take 5 mLs by mouth every 8 (eight) hours as needed (cough).     sucralfate (CARAFATE) 1 g tablet Take 1 g by mouth 4 (four) times daily.     traZODone (DESYREL) 100 MG tablet Take one 1/2 to one tablet at bedtime. 45 tablet 5   Turmeric 500 MG CAPS Take 500 mg by mouth daily.      No current facility-administered medications for this visit.    Medication Side Effects: {Medication Side Effects (Optional):21014029}  Allergies:  Allergies  Allergen Reactions   Tapentadol Swelling and Rash    Aloe Hives   Duloxetine Other (See Comments)    RESTLESSNESS, URGES TO MOVE   Gabapentin Swelling    SWELLING REACTION OF FEET    Olive Oil Hives   Pregabalin Other (See Comments)    Heart palpitations, Fainting, chest pain, sweats   Metoclopramide Other (See Comments)   Zonisamide Other (See Comments)    WORSENING PAIN    Past Medical History:  Diagnosis Date   Fibromyalgia    Fibromyalgia 06/28/2019   GERD (gastroesophageal reflux disease)    Headache    Insomnia    Insulin dependent type 1 diabetes mellitus (HCC)    LGSIL of cervix of undetermined significance 10/2018   Colposcopy normal with ECC showing atypical fragments.  Recommend follow-up Pap smear/HPV at next annual exam.   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    Family History  Problem Relation Age of Onset   Diabetes Mother    Migraines Mother    Hypertension Mother    CVA Mother    Breast cancer Maternal Grandmother    Thyroid disease Maternal Grandmother    Cancer Maternal Grandfather     Social History   Socioeconomic History   Marital status: Single    Spouse name: Not on file   Number of children: Not on file   Years of education: Not on file   Highest education level: Not on file  Occupational History   Not on file  Tobacco Use   Smoking status: Never   Smokeless tobacco: Never  Vaping Use   Vaping status: Never Used  Substance and Sexual Activity   Alcohol use: No    Alcohol/week: 0.0 standard drinks of alcohol   Drug use: No   Sexual activity: Yes    Birth control/protection: Implant    Comment: 1ST INTERCOURSE- 15, PARTNERS - 7 . Nexplanon 06/27/2018  Other Topics Concern   Not on file  Social History Narrative   Student.  Not married.  Lives alone in a one story home.   Not working.   Social Determinants of Health   Financial Resource Strain: Not on file  Food Insecurity: Not  on file  Transportation Needs: Not on file  Physical Activity: Not on file  Stress: Not on file  Social Connections: Not on file  Intimate Partner Violence: Not on file    Past Medical History, Surgical history, Social history, and Family history were reviewed and updated as appropriate.   Please see review of systems for further details on the patient's review from today.   Objective:   Physical Exam:  There were no vitals taken for this visit.  Physical Exam  Lab Review:  Component Value Date/Time   NA 137 05/12/2020 1357   K 3.7 05/12/2020 1357   CL 106 05/12/2020 1357   CO2 22 05/12/2020 1357   GLUCOSE 121 (H) 05/12/2020 1357   BUN 7 05/12/2020 1357   CREATININE 0.78 05/12/2020 1357   CREATININE 0.55 12/25/2013 1455   CALCIUM 8.9 05/12/2020 1357   PROT 7.6 05/11/2020 1604   ALBUMIN 3.9 05/11/2020 1604   AST 14 (L) 05/11/2020 1604   ALT 13 05/11/2020 1604   ALKPHOS 87 05/11/2020 1604   BILITOT 1.3 (H) 05/11/2020 1604   GFRNONAA >60 05/12/2020 1357   GFRAA >60 07/05/2019 1254       Component Value Date/Time   WBC 9.0 05/11/2020 1604   RBC 3.86 (L) 05/11/2020 1604   HGB 12.6 05/11/2020 1753   HCT 37.0 05/11/2020 1753   PLT 457 (H) 05/11/2020 1604   MCV 95.6 05/11/2020 1604   MCV 97.7 (A) 05/24/2013 2021   MCH 30.6 05/11/2020 1604   MCHC 32.0 05/11/2020 1604   RDW 12.9 05/11/2020 1604   LYMPHSABS 3.4 10/26/2017 0607   MONOABS 0.9 10/26/2017 0607   EOSABS 0.1 10/26/2017 0607   BASOSABS 0.1 10/26/2017 0607    No results found for: "POCLITH", "LITHIUM"   No results found for: "PHENYTOIN", "PHENOBARB", "VALPROATE", "CBMZ"   .res Assessment: Plan:    There are no diagnoses linked to this encounter.   Please see After Visit Summary for patient specific instructions.  Future Appointments  Date Time Provider Department Center  01/04/2023  4:40 PM Joani Cosma, Thereasa Solo, NP [facility] None    No orders of the defined types were placed in this  encounter.     -------------------------------

## 2023-01-25 ENCOUNTER — Encounter: Payer: Self-pay | Admitting: Adult Health

## 2023-01-25 ENCOUNTER — Telehealth: Payer: BC Managed Care – PPO | Admitting: Adult Health

## 2023-01-25 DIAGNOSIS — F902 Attention-deficit hyperactivity disorder, combined type: Secondary | ICD-10-CM

## 2023-01-25 DIAGNOSIS — F419 Anxiety disorder, unspecified: Secondary | ICD-10-CM | POA: Diagnosis not present

## 2023-01-25 DIAGNOSIS — F331 Major depressive disorder, recurrent, moderate: Secondary | ICD-10-CM

## 2023-01-25 DIAGNOSIS — F32A Depression, unspecified: Secondary | ICD-10-CM | POA: Diagnosis not present

## 2023-01-25 DIAGNOSIS — G4700 Insomnia, unspecified: Secondary | ICD-10-CM

## 2023-01-25 DIAGNOSIS — F410 Panic disorder [episodic paroxysmal anxiety] without agoraphobia: Secondary | ICD-10-CM

## 2023-01-25 DIAGNOSIS — F909 Attention-deficit hyperactivity disorder, unspecified type: Secondary | ICD-10-CM

## 2023-01-25 DIAGNOSIS — F411 Generalized anxiety disorder: Secondary | ICD-10-CM

## 2023-01-25 MED ORDER — LISDEXAMFETAMINE DIMESYLATE 40 MG PO CAPS: 40.0000 mg | ORAL_CAPSULE | ORAL | 0 refills | Status: DC

## 2023-01-25 MED ORDER — REXULTI 0.5 MG PO TABS
0.5000 mg | ORAL_TABLET | Freq: Every morning | ORAL | 2 refills | Status: DC
Start: 2023-01-25 — End: 2024-01-05

## 2023-01-25 NOTE — Progress Notes (Signed)
Theresa [name] [id number] 1990/10/25 32 y.o.  Virtual Visit via Video Note  I connected with pt @ on 01/25/23 at 10:40 AM EST by a video enabled telemedicine application and verified that I am speaking with the correct person using two identifiers.   I discussed the limitations of evaluation and management by telemedicine and the availability of in person appointments. The patient expressed understanding and agreed to proceed.  I discussed the assessment and treatment plan with the patient. The patient was provided an opportunity to ask questions and all were answered. The patient agreed with the plan and demonstrated an understanding of the instructions.   The patient was advised to call back or seek an in-person evaluation if the symptoms worsen or if the condition fails to improve as anticipated.  I provided 25 minutes of non-face-to-face time during this encounter.  The patient was located at home.  The provider was located at [facility].   Theresa Gibbs, NP   Subjective:   Patient ID:  Theresa [name] is a 32 y.o. (DOB 1990/10/24) 32 female.  Chief Complaint: No chief complaint on file.   HPI Theresa [name] presents for follow-up of panic attacks, anxiety, depression, ADHD, and insomnia.   Describes mood today as "ok". Pleasant. Reports tearfulness. Mood symptoms - reports depression, anxiety and irritability. Reports panic attacks. Reports worry, rumination, and over thinking. Denies obsessive thoughts and acts. Reports seasonal changes contributing to a lower mood. Stating "I'm not feeling any better. Working with pain management provider. Stable interest and motivation. Taking other medications as prescribed.  Energy levels decreased. Active, does not have a regular exercise routine.   Enjoys some usual interests and activities. Single. Lives with aunt and dog - "Kila". Family local.   Appetite varies. Weight stable - 212 pounds. Sleeps better some  nights than others - "it comes and it goes - not consistent. Reports some napping on nights she doesn't sleep. Reports focus and concentration improved with Vyvanse. Completing tasks. Managing some aspects of household.  Denies SI or HI.  Denies AH or VH. Denies self harm. Denies substance use.  Review of Systems:  Review of Systems  Musculoskeletal:  Negative for gait problem.  Neurological:  Negative for tremors.  Psychiatric/Behavioral:         Please refer to HPI    Medications: I have reviewed the patient's current medications.  Current Outpatient Medications  Medication Sig Dispense Refill   ARIPiprazole (ABILIFY) 2 MG tablet Take 1 tablet (2 mg total) by mouth daily. 30 tablet 2   B-COMPLEX-C PO Take 1 capsule by mouth daily.      buPROPion (WELLBUTRIN XL) 300 MG 24 hr tablet Take 1 tablet (300 mg total) by mouth daily. 30 tablet 5   cholecalciferol (VITAMIN D3) 25 MCG (1000 UNIT) tablet Take 1,000 Units by mouth daily.     clonazePAM (KLONOPIN) 1 MG tablet Take 1 tablet (1 mg total) by mouth 2 (two) times daily. 30 tablet 2   CONTOUR NEXT TEST test strip 1 each by Other route See admin instructions. 7 times daily     dexlansoprazole (DEXILANT) 60 MG capsule Take 1 capsule by mouth daily.     diphenoxylate-atropine (LOMOTIL) 2.5-0.025 MG tablet Take 1 tablet by mouth 4 (four) times daily as needed.     etonogestrel (NEXPLANON) 68 MG IMPL implant 1 each by Subdermal route continuous.      famotidine (PEPCID) 40 MG tablet Take 1 tablet by mouth daily.  fluconazole (DIFLUCAN) 200 MG tablet Take 200 mg by mouth every other day. (Patient not taking: Reported on 05/11/2020)     FLUoxetine (PROZAC) 40 MG capsule TAKE 1 CAPSULE BY MOUTH DAILY 30 capsule 5   hydrOXYzine (ATARAX/VISTARIL) 10 MG tablet Take 10 mg by mouth every 8 (eight) hours as needed for itching.     insulin aspart (NOVOLOG) 100 UNIT/ML FlexPen Inject 5-20 Units into the skin See admin instructions. Sliding scale: If  blood sugar over 150, takes 5 units. Maximum 20 units.     insulin degludec (TRESIBA) 100 UNIT/ML FlexTouch Pen Inject 50 Units into the skin daily.     lisdexamfetamine (VYVANSE) 40 MG capsule Take 1 capsule (40 mg total) by mouth every morning. 30 capsule 0   lisdexamfetamine (VYVANSE) 40 MG capsule Take 1 capsule (40 mg total) by mouth daily. 30 capsule 0   NARCAN 4 MG/0.1ML LIQD nasal spray kit Place 1 spray into the nose once. Overdose     ondansetron (ZOFRAN-ODT) 8 MG disintegrating tablet Take 8 mg by mouth 3 (three) times daily as needed.     OVER THE COUNTER MEDICATION Take 1 Dose by mouth daily as needed (nausea/vomiting).     Promethazine HCl 6.25 MG/5ML SOLN Take 5 mLs by mouth every 8 (eight) hours as needed (cough).     sucralfate (CARAFATE) 1 g tablet Take 1 g by mouth 4 (four) times daily.     traZODone (DESYREL) 100 MG tablet Take one 1/2 to one tablet at bedtime. 45 tablet 5   Turmeric 500 MG CAPS Take 500 mg by mouth daily.      No current facility-administered medications for this visit.    Medication Side Effects: None  Allergies:  Allergies  Allergen Reactions   Tapentadol Swelling and Rash   Aloe Hives   Duloxetine Other (See Comments)    RESTLESSNESS, URGES TO MOVE   Gabapentin Swelling    SWELLING REACTION OF FEET    Olive Oil Hives   Pregabalin Other (See Comments)    Heart palpitations, Fainting, chest pain, sweats   Metoclopramide Other (See Comments)   Zonisamide Other (See Comments)    WORSENING PAIN    Past Medical History:  Diagnosis Date   Fibromyalgia    Fibromyalgia 06/28/2019   GERD (gastroesophageal reflux disease)    Headache    Insomnia    Insulin dependent type 1 diabetes mellitus (HCC)    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  Recommend follow-up Pap smear/HPV at next annual exam.   Nausea & vomiting 04/08/2020   Peripheral neuropathy    Seasonal allergies     Traction retinal detachment    vitreou hemorrhage right eye    Family History  Problem Relation Age of Onset   Diabetes Mother    Migraines Mother    Hypertension Mother    CVA Mother    Breast cancer Maternal Grandmother    Thyroid disease Maternal Grandmother    Cancer Maternal Grandfather     Social History   Socioeconomic History   Marital status: Single    Spouse name: Not on file   Number of children: Not on file   Years of education: Not on file   Highest education level: Not on file  Occupational History   Not on file  Tobacco Use   Smoking status: Never   Smokeless tobacco: Never  Vaping Use   Vaping status: Never Used  Substance  and Sexual Activity   Alcohol use: No    Alcohol/week: 0.0 standard drinks of alcohol   Drug use: No   Sexual activity: Yes    Birth control/protection: Implant    Comment: 1ST INTERCOURSE- 65, PARTNERS - 6 . Nexplanon 06/27/2018  Other Topics Concern   Not on file  Social History Narrative   Student.  Not married.  Lives alone in a one story home.   Not working.   Social Determinants of Health   Financial Resource Strain: Not on file  Food Insecurity: Not on file  Transportation Needs: Not on file  Physical Activity: Not on file  Stress: Not on file  Social Connections: Not on file  Intimate Partner Violence: Not on file    Past Medical History, Surgical history, Social history, and Family history were reviewed and updated as appropriate.   Please see review of systems for further details on the patient's review from today.   Objective:   Physical Exam:  There were no vitals taken for this visit.  Physical Exam Constitutional:      General: She is not in acute distress. Musculoskeletal:        General: No deformity.  Neurological:     Mental Status: She is alert and oriented to person, place, and time.     Coordination: Coordination normal.  Psychiatric:        Attention and Perception: Attention and perception  normal. She does not perceive auditory or visual hallucinations.        Mood and Affect: Mood is depressed. Affect is not labile, blunt, angry or inappropriate.        Speech: Speech normal.        Behavior: Behavior normal.        Thought Content: Thought content normal. Thought content is not paranoid or delusional. Thought content does not include homicidal or suicidal ideation. Thought content does not include homicidal or suicidal plan.        Cognition and Memory: Cognition and memory normal.        Judgment: Judgment normal.     Comments: Insight intact     Lab Review:     Component Value Date/Time   NA 137 05/12/2020 1357   K 3.7 05/12/2020 1357   CL 106 05/12/2020 1357   CO2 22 05/12/2020 1357   GLUCOSE 121 (H) 05/12/2020 1357   BUN 7 05/12/2020 1357   CREATININE 0.78 05/12/2020 1357   CREATININE 0.55 12/25/2013 1455   CALCIUM 8.9 05/12/2020 1357   PROT 7.6 05/11/2020 1604   ALBUMIN 3.9 05/11/2020 1604   AST 14 (L) 05/11/2020 1604   ALT 13 05/11/2020 1604   ALKPHOS 87 05/11/2020 1604   BILITOT 1.3 (H) 05/11/2020 1604   GFRNONAA >60 05/12/2020 1357   GFRAA >60 07/05/2019 1254       Component Value Date/Time   WBC 9.0 05/11/2020 1604   RBC 3.86 (L) 05/11/2020 1604   HGB 12.6 05/11/2020 1753   HCT 37.0 05/11/2020 1753   PLT 457 (H) 05/11/2020 1604   MCV 95.6 05/11/2020 1604   MCV 97.7 (A) 05/24/2013 2021   MCH 30.6 05/11/2020 1604   MCHC 32.0 05/11/2020 1604   RDW 12.9 05/11/2020 1604   LYMPHSABS 3.4 10/26/2017 0607   MONOABS 0.9 10/26/2017 0607   EOSABS 0.1 10/26/2017 0607   BASOSABS 0.1 10/26/2017 0607    No results found for: "POCLITH", "LITHIUM"   No results found for: "PHENYTOIN", "PHENOBARB", "VALPROATE", "CBMZ"   .res  Assessment: Plan:    Plan:  Discontinue Abilify 2mg  daily - side effects - nausea and vomiting  Prozac 40mg  daily  Wellbutrin XL 300mg  daily - denies seizure history. Clonazepam 1mg  BID as needed Trazadone 100mg  - 1/2 to one  tablet at bedtime.  Vyvanse 40mg  daily  RTC 4 weeks  Discussed potential benefits, risks, and side effects of stimulants with patient to include increased heart rate, palpitations, insomnia, increased anxiety, increased irritability, or decreased appetite.  Instructed patient to contact office if experiencing any significant tolerability issues.   Discussed potential benefits, risk, and side effects of benzodiazepines to include potential risk of tolerance and dependence, as well as possible drowsiness.  Advised patient not to drive if experiencing drowsiness and to take lowest possible effective dose to minimize risk of dependence and tolerance. There are no diagnoses linked to this encounter.   Please see After Visit Summary for patient specific instructions.  Future Appointments  Date Time Provider Department Center  01/25/2023 10:40 AM Khamauri Bauernfeind, Thereasa Solo, NP CP-CP None    No orders of the defined types were placed in this encounter.     -------------------------------

## 2023-02-06 ENCOUNTER — Telehealth: Payer: Self-pay

## 2023-02-06 NOTE — Telephone Encounter (Signed)
Prior Authorization submitted and approved for Rexulti 0.5 mg with [employer] effective through 02/06/2024.

## 2023-04-07 ENCOUNTER — Other Ambulatory Visit: Payer: Self-pay

## 2023-04-07 ENCOUNTER — Ambulatory Visit (INDEPENDENT_AMBULATORY_CARE_PROVIDER_SITE_OTHER): Payer: Self-pay | Admitting: Adult Health

## 2023-04-07 ENCOUNTER — Telehealth: Payer: Self-pay | Admitting: Adult Health

## 2023-04-07 DIAGNOSIS — Z0389 Encounter for observation for other suspected diseases and conditions ruled out: Secondary | ICD-10-CM

## 2023-04-07 DIAGNOSIS — F411 Generalized anxiety disorder: Secondary | ICD-10-CM

## 2023-04-07 MED ORDER — CLONAZEPAM 1 MG PO TABS
1.0000 mg | ORAL_TABLET | Freq: Two times a day (BID) | ORAL | 0 refills | Status: DC
Start: 2023-04-09 — End: 2023-04-11

## 2023-04-07 NOTE — Telephone Encounter (Signed)
Next appt is 04/11/23. Theresa [name] is out of her Clonazepam. She would like it called to:  [id number] Ginette Otto, [place] - [address] AT [facility] & [address]   Phone: [phone]  Fax: [phone]

## 2023-04-07 NOTE — Telephone Encounter (Signed)
Pended clonazepam to [facility].

## 2023-04-07 NOTE — Progress Notes (Signed)
Patient no show appointment. ? ?

## 2023-04-07 NOTE — Telephone Encounter (Signed)
Changed qty.

## 2023-04-11 ENCOUNTER — Encounter: Payer: Self-pay | Admitting: Adult Health

## 2023-04-11 ENCOUNTER — Ambulatory Visit: Payer: BC Managed Care – PPO | Admitting: Adult Health

## 2023-04-11 ENCOUNTER — Telehealth: Payer: BC Managed Care – PPO | Admitting: Adult Health

## 2023-04-11 DIAGNOSIS — G4700 Insomnia, unspecified: Secondary | ICD-10-CM

## 2023-04-11 DIAGNOSIS — F419 Anxiety disorder, unspecified: Secondary | ICD-10-CM

## 2023-04-11 DIAGNOSIS — F411 Generalized anxiety disorder: Secondary | ICD-10-CM

## 2023-04-11 DIAGNOSIS — F331 Major depressive disorder, recurrent, moderate: Secondary | ICD-10-CM

## 2023-04-11 DIAGNOSIS — F909 Attention-deficit hyperactivity disorder, unspecified type: Secondary | ICD-10-CM

## 2023-04-11 DIAGNOSIS — F32A Depression, unspecified: Secondary | ICD-10-CM

## 2023-04-11 DIAGNOSIS — F410 Panic disorder [episodic paroxysmal anxiety] without agoraphobia: Secondary | ICD-10-CM

## 2023-04-11 DIAGNOSIS — F902 Attention-deficit hyperactivity disorder, combined type: Secondary | ICD-10-CM

## 2023-04-11 MED ORDER — QUETIAPINE FUMARATE 25 MG PO TABS: ORAL_TABLET | ORAL | 2 refills | Status: DC

## 2023-04-11 MED ORDER — CLONAZEPAM 1 MG PO TABS: 1.0000 mg | ORAL_TABLET | Freq: Two times a day (BID) | ORAL | 2 refills | Status: DC

## 2023-04-11 NOTE — Progress Notes (Signed)
Theresa [name] [id number] Aug 11, 1990 33 y.o.  Virtual Visit via Video Note  I connected with pt @ on 04/11/23 at  2:00 PM EST by a video enabled telemedicine application and verified that I am speaking with the correct person using two identifiers.   I discussed the limitations of evaluation and management by telemedicine and the availability of in person appointments. The patient expressed understanding and agreed to proceed.  I discussed the assessment and treatment plan with the patient. The patient was provided an opportunity to ask questions and all were answered. The patient agreed with the plan and demonstrated an understanding of the instructions.   The patient was advised to call back or seek an in-person evaluation if the symptoms worsen or if the condition fails to improve as anticipated.  I provided 25 minutes of non-face-to-face time during this encounter.  The patient was located at home.  The provider was located at [facility].   Theresa Gibbs, NP   Subjective:   Patient ID:  Theresa [name] is a 33 y.o. (DOB 13-Jan-1991) female.  Chief Complaint: No chief complaint on file.   HPI Theresa [name] presents for follow-up of panic attacks, anxiety, depression, ADHD, and insomnia.   Describes mood today as "ok". Pleasant. Reports tearfulness. Mood symptoms - reports depression and anxiety. Denies irritability. Reports panic attacks. Reports worry, rumination, and over thinking. Denies obsessive thoughts and acts.  Mood is lower - due to lack of sleep. Stating "I feel like I made it through the holiday bump and things are tolerable". Working with pain management provider. Reports varying interest and motivation. Taking other medications as prescribed.  Energy levels decreased. Active, does not have a regular exercise routine.   Enjoys some usual interests and activities. Single. Lives with aunt and dog - "Devens". Family local.   Appetite fluctuates.  Weight loss - 204 from 212 pounds. Sleeps better some nights than others - "my sleep is poor and inconsistent". Reports daytime napping. Reports focus and concentration improved with Vyvanse. Completing tasks. Managing some aspects of household.  Denies SI or HI.  Denies AH or VH. Denies self harm. Denies substance use.  Review of Systems:  Review of Systems  Musculoskeletal:  Negative for gait problem.  Neurological:  Negative for tremors.  Psychiatric/Behavioral:         Please refer to HPI    Medications: I have reviewed the patient's current medications.  Current Outpatient Medications  Medication Sig Dispense Refill   B-COMPLEX-C PO Take 1 capsule by mouth daily.      Brexpiprazole (REXULTI) 0.5 MG TABS Take 1 tablet (0.5 mg total) by mouth in the morning. 30 tablet 2   buPROPion (WELLBUTRIN XL) 300 MG 24 hr tablet Take 1 tablet (300 mg total) by mouth daily. 30 tablet 5   cholecalciferol (VITAMIN D3) 25 MCG (1000 UNIT) tablet Take 1,000 Units by mouth daily.     clonazePAM (KLONOPIN) 1 MG tablet Take 1 tablet (1 mg total) by mouth 2 (two) times daily. 12 tablet 0   CONTOUR NEXT TEST test strip 1 each by Other route See admin instructions. 7 times daily     dexlansoprazole (DEXILANT) 60 MG capsule Take 1 capsule by mouth daily.     diphenoxylate-atropine (LOMOTIL) 2.5-0.025 MG tablet Take 1 tablet by mouth 4 (four) times daily as needed.     etonogestrel (NEXPLANON) 68 MG IMPL implant 1 each by Subdermal route continuous.      famotidine (PEPCID) 40  MG tablet Take 1 tablet by mouth daily.     fluconazole (DIFLUCAN) 200 MG tablet Take 200 mg by mouth every other day. (Patient not taking: Reported on 05/11/2020)     FLUoxetine (PROZAC) 40 MG capsule TAKE 1 CAPSULE BY MOUTH DAILY 30 capsule 5   hydrOXYzine (ATARAX/VISTARIL) 10 MG tablet Take 10 mg by mouth every 8 (eight) hours as needed for itching.     insulin aspart (NOVOLOG) 100 UNIT/ML FlexPen Inject 5-20 Units into the skin See  admin instructions. Sliding scale: If blood sugar over 150, takes 5 units. Maximum 20 units.     insulin degludec (TRESIBA) 100 UNIT/ML FlexTouch Pen Inject 50 Units into the skin daily.     lisdexamfetamine (VYVANSE) 40 MG capsule Take 1 capsule (40 mg total) by mouth daily. 30 capsule 0   lisdexamfetamine (VYVANSE) 40 MG capsule Take 1 capsule (40 mg total) by mouth every morning. 30 capsule 0   NARCAN 4 MG/0.1ML LIQD nasal spray kit Place 1 spray into the nose once. Overdose     ondansetron (ZOFRAN-ODT) 8 MG disintegrating tablet Take 8 mg by mouth 3 (three) times daily as needed.     OVER THE COUNTER MEDICATION Take 1 Dose by mouth daily as needed (nausea/vomiting).     Promethazine HCl 6.25 MG/5ML SOLN Take 5 mLs by mouth every 8 (eight) hours as needed (cough).     sucralfate (CARAFATE) 1 g tablet Take 1 g by mouth 4 (four) times daily.     traZODone (DESYREL) 100 MG tablet Take one 1/2 to one tablet at bedtime. 45 tablet 5   Turmeric 500 MG CAPS Take 500 mg by mouth daily.      No current facility-administered medications for this visit.    Medication Side Effects: None  Allergies:  Allergies  Allergen Reactions   Tapentadol Swelling and Rash   Aloe Hives   Duloxetine Other (See Comments)    RESTLESSNESS, URGES TO MOVE   Gabapentin Swelling    SWELLING REACTION OF FEET    Olive Oil Hives   Pregabalin Other (See Comments)    Heart palpitations, Fainting, chest pain, sweats   Metoclopramide Other (See Comments)   Zonisamide Other (See Comments)    WORSENING PAIN    Past Medical History:  Diagnosis Date   Fibromyalgia    Fibromyalgia 06/28/2019   GERD (gastroesophageal reflux disease)    Headache    Insomnia    Insulin dependent type 1 diabetes mellitus (HCC)    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  Recommend follow-up Pap smear/HPV at next annual exam.   Nausea & vomiting 04/08/2020   Peripheral  neuropathy    Seasonal allergies    Traction retinal detachment    vitreou hemorrhage right eye    Family History  Problem Relation Age of Onset   Diabetes Mother    Migraines Mother    Hypertension Mother    CVA Mother    Breast cancer Maternal Grandmother    Thyroid disease Maternal Grandmother    Cancer Maternal Grandfather     Social History   Socioeconomic History   Marital status: Single    Spouse name: Not on file   Number of children: Not on file   Years of education: Not on file   Highest education level: Not on file  Occupational History   Not on file  Tobacco Use   Smoking status: Never   Smokeless tobacco:  Never  Vaping Use   Vaping status: Never Used  Substance and Sexual Activity   Alcohol use: No    Alcohol/week: 0.0 standard drinks of alcohol   Drug use: No   Sexual activity: Yes    Birth control/protection: Implant    Comment: 1ST INTERCOURSE- 31, PARTNERS - 6 . Nexplanon 06/27/2018  Other Topics Concern   Not on file  Social History Narrative   Student.  Not married.  Lives alone in a one story home.   Not working.   Social Drivers of Corporate investment banker Strain: Not on file  Food Insecurity: Not on file  Transportation Needs: Not on file  Physical Activity: Not on file  Stress: Not on file  Social Connections: Not on file  Intimate Partner Violence: Not on file    Past Medical History, Surgical history, Social history, and Family history were reviewed and updated as appropriate.   Please see review of systems for further details on the patient's review from today.   Objective:   Physical Exam:  There were no vitals taken for this visit.  Physical Exam Constitutional:      General: She is not in acute distress. Musculoskeletal:        General: No deformity.  Neurological:     Mental Status: She is alert and oriented to person, place, and time.     Coordination: Coordination normal.  Psychiatric:        Attention and  Perception: Attention and perception normal. She does not perceive auditory or visual hallucinations.        Mood and Affect: Affect is not labile, blunt, angry or inappropriate.        Speech: Speech normal.        Behavior: Behavior normal. Behavior is cooperative.        Thought Content: Thought content normal. Thought content is not paranoid or delusional. Thought content does not include homicidal or suicidal ideation. Thought content does not include homicidal or suicidal plan.        Cognition and Memory: Cognition and memory normal.        Judgment: Judgment normal.     Comments: Insight intact     Lab Review:     Component Value Date/Time   NA 137 05/12/2020 1357   K 3.7 05/12/2020 1357   CL 106 05/12/2020 1357   CO2 22 05/12/2020 1357   GLUCOSE 121 (H) 05/12/2020 1357   BUN 7 05/12/2020 1357   CREATININE 0.78 05/12/2020 1357   CREATININE 0.55 12/25/2013 1455   CALCIUM 8.9 05/12/2020 1357   PROT 7.6 05/11/2020 1604   ALBUMIN 3.9 05/11/2020 1604   AST 14 (L) 05/11/2020 1604   ALT 13 05/11/2020 1604   ALKPHOS 87 05/11/2020 1604   BILITOT 1.3 (H) 05/11/2020 1604   GFRNONAA >60 05/12/2020 1357   GFRAA >60 07/05/2019 1254       Component Value Date/Time   WBC 9.0 05/11/2020 1604   RBC 3.86 (L) 05/11/2020 1604   HGB 12.6 05/11/2020 1753   HCT 37.0 05/11/2020 1753   PLT 457 (H) 05/11/2020 1604   MCV 95.6 05/11/2020 1604   MCV 97.7 (A) 05/24/2013 2021   MCH 30.6 05/11/2020 1604   MCHC 32.0 05/11/2020 1604   RDW 12.9 05/11/2020 1604   LYMPHSABS 3.4 10/26/2017 0607   MONOABS 0.9 10/26/2017 0607   EOSABS 0.1 10/26/2017 0607   BASOSABS 0.1 10/26/2017 0607    No results found for: "POCLITH", "LITHIUM"  No results found for: "PHENYTOIN", "PHENOBARB", "VALPROATE", "CBMZ"   .res Assessment: Plan:    Plan:  D/C Rexulti 0.5mg  daily has not started - it was out of stock. D/C Trazadone 100mg  - 1/2 to one tablet at bedtime. Add Seroquel 25mg  - take 1 to 2 at  bedtime  Prozac 40mg  daily  Wellbutrin XL 300mg  daily - denies seizure history. Clonazepam 1mg  BID as needed  Vyvanse 40mg  daily  Discussed Spravato - will think about options.  RTC 4 weeks  25 minutes spent dedicated to the care of this patient on the date of this encounter to include pre-visit review of records, ordering of medication, post visit documentation, and face-to-face time with the patient discussing panic attacks, anxiety, depression, ADHD, and insomnia. Discussed adding Seroquel to current medication regimen. Will D/C Rexulti and Trazadone.   Discussed potential benefits, risks, and side effects of stimulants with patient to include increased heart rate, palpitations, insomnia, increased anxiety, increased irritability, or decreased appetite.  Instructed patient to contact office if experiencing any significant tolerability issues.   Discussed potential benefits, risk, and side effects of benzodiazepines to include potential risk of tolerance and dependence, as well as possible drowsiness.  Advised patient not to drive if experiencing drowsiness and to take lowest possible effective dose to minimize risk of dependence and tolerance.  There are no diagnoses linked to this encounter.   Please see After Visit Summary for patient specific instructions.  Future Appointments  Date Time Provider Department Center  04/11/2023  2:00 PM Tai Syfert, Thereasa Solo, NP CP-CP None    No orders of the defined types were placed in this encounter.     -------------------------------

## 2023-05-11 ENCOUNTER — Telehealth: Payer: BC Managed Care – PPO | Admitting: Adult Health

## 2023-05-18 ENCOUNTER — Telehealth (INDEPENDENT_AMBULATORY_CARE_PROVIDER_SITE_OTHER): Payer: Self-pay | Admitting: Adult Health

## 2023-05-18 ENCOUNTER — Encounter: Payer: Self-pay | Admitting: Adult Health

## 2023-05-18 DIAGNOSIS — Z0389 Encounter for observation for other suspected diseases and conditions ruled out: Secondary | ICD-10-CM

## 2023-05-18 NOTE — Progress Notes (Signed)
Patient no show appointment. Called and LVM.  

## 2023-06-29 ENCOUNTER — Other Ambulatory Visit: Payer: Self-pay | Admitting: Adult Health

## 2023-06-29 DIAGNOSIS — G4700 Insomnia, unspecified: Secondary | ICD-10-CM

## 2023-07-01 ENCOUNTER — Telehealth: Payer: Self-pay | Admitting: Adult Health

## 2023-07-01 DIAGNOSIS — F331 Major depressive disorder, recurrent, moderate: Secondary | ICD-10-CM

## 2023-07-01 MED ORDER — FLUOXETINE HCL 40 MG PO CAPS: ORAL_CAPSULE | ORAL | 0 refills | Status: DC

## 2023-07-01 NOTE — Telephone Encounter (Signed)
 Clonazepam  LF 4/2, due 4/30. Rx for fluoxetine  sent to [facility] for 30-day supply. She has FU 4/30 and clonazepam  can be addressed at that time.

## 2023-07-01 NOTE — Telephone Encounter (Signed)
 Pt called at 3:40p and requesting Fluoxetine  40mg  and Clonazepam  1mg  to    [facility] #[id number] Jonette Nestle, [place] - [address] AT [facility] OF [facility] & [address] [place] [address] Phone: [phone]  Fax: [phone]   Next appt 4/30

## 2023-07-06 ENCOUNTER — Telehealth: Admitting: Adult Health

## 2023-07-06 ENCOUNTER — Encounter: Payer: Self-pay | Admitting: Adult Health

## 2023-07-06 DIAGNOSIS — G4700 Insomnia, unspecified: Secondary | ICD-10-CM | POA: Diagnosis not present

## 2023-07-06 DIAGNOSIS — F902 Attention-deficit hyperactivity disorder, combined type: Secondary | ICD-10-CM

## 2023-07-06 DIAGNOSIS — F411 Generalized anxiety disorder: Secondary | ICD-10-CM

## 2023-07-06 DIAGNOSIS — F410 Panic disorder [episodic paroxysmal anxiety] without agoraphobia: Secondary | ICD-10-CM

## 2023-07-06 DIAGNOSIS — F331 Major depressive disorder, recurrent, moderate: Secondary | ICD-10-CM | POA: Diagnosis not present

## 2023-07-06 MED ORDER — ZOLPIDEM TARTRATE 10 MG PO TABS: 10.0000 mg | ORAL_TABLET | Freq: Every evening | ORAL | 1 refills | Status: DC | PRN

## 2023-07-06 MED ORDER — LISDEXAMFETAMINE DIMESYLATE 40 MG PO CAPS
40.0000 mg | ORAL_CAPSULE | Freq: Every day | ORAL | 0 refills | Status: DC
Start: 2023-07-06 — End: 2023-08-03

## 2023-07-06 NOTE — Progress Notes (Signed)
 Theresa [name] [id number] 04/28/1990 33 y.o.  Virtual Visit via Video Note  I connected with pt @ on 07/06/23 at  3:00 PM EDT by a video enabled telemedicine application and verified that I am speaking with the correct person using two identifiers.   I discussed the limitations of evaluation and management by telemedicine and the availability of in person appointments. The patient expressed understanding and agreed to proceed.  I discussed the assessment and treatment plan with the patient. The patient was provided an opportunity to ask questions and all were answered. The patient agreed with the plan and demonstrated an understanding of the instructions.   The patient was advised to call back or seek an in-person evaluation if the symptoms worsen or if the condition fails to improve as anticipated.  I provided 25 minutes of non-face-to-face time during this encounter.  The patient was located at home.  The provider was located at [facility].   Reagan Camera, NP   Subjective:   Patient ID:  Theresa [name] is a 33 y.o. (DOB [birth year]) female.  Chief Complaint: No chief complaint on file.   HPI Theresa [name] presents for follow-up of panic attacks, anxiety, depression, ADHD and insomnia.   Describes mood today as "ok". Pleasant. Reports tearfulness. Mood symptoms - reports depression,  anxiety and irritability - more depressed overall. Reports improved interest and motivation.Reports panic attacks - not consistent. Reports worry, rumination and over thinking. Denies obsessive thoughts and acts. Reports mood is lower. Stating "I don't feel like I'm seeing progress". Reports other chronic conditions also contributing to mood symptoms. Working with pain management. Taking other medications as prescribed.  Energy levels decreased. Active, does not have a regular exercise routine.   Enjoys some usual interests and activities. Single. Lives with aunt and dog -  "[place]". Family local.   Appetite fluctuates. Weight gain - 210 pounds. Sleeps better some nights than others. Averages 4 to 5 hours of broken sleep. Denies daytime napping. Reports focus and concentration improved with Vyvanse . Completing tasks. Managing some aspects of household.  Denies SI or HI.  Denies AH or VH. Denies self harm. Denies substance use.  Review of Systems:  Review of Systems  Musculoskeletal:  Negative for gait problem.  Neurological:  Negative for tremors.  Psychiatric/Behavioral:         Please refer to HPI    Medications: I have reviewed the patient's current medications.  Current Outpatient Medications  Medication Sig Dispense Refill   zolpidem (AMBIEN) 10 MG tablet Take 1 tablet (10 mg total) by mouth at bedtime as needed for sleep. 30 tablet 1   B-COMPLEX-C PO Take 1 capsule by mouth daily.      Brexpiprazole  (REXULTI ) 0.5 MG TABS Take 1 tablet (0.5 mg total) by mouth in the morning. 30 tablet 2   buPROPion  (WELLBUTRIN  XL) 300 MG 24 hr tablet Take 1 tablet (300 mg total) by mouth daily. 30 tablet 5   cholecalciferol (VITAMIN D3) 25 MCG (1000 UNIT) tablet Take 1,000 Units by mouth daily.     clonazePAM  (KLONOPIN ) 1 MG tablet Take 1 tablet (1 mg total) by mouth 2 (two) times daily. 60 tablet 2   CONTOUR NEXT TEST test strip 1 each by Other route See admin instructions. 7 times daily     dexlansoprazole (DEXILANT) 60 MG capsule Take 1 capsule by mouth daily.     diphenoxylate-atropine  (LOMOTIL) 2.5-0.025 MG tablet Take 1 tablet by mouth 4 (four) times daily as needed.  etonogestrel  (NEXPLANON ) 68 MG IMPL implant 1 each by Subdermal route continuous.      famotidine  (PEPCID ) 40 MG tablet Take 1 tablet by mouth daily.     fluconazole  (DIFLUCAN ) 200 MG tablet Take 200 mg by mouth every other day. (Patient not taking: Reported on 05/11/2020)     FLUoxetine  (PROZAC ) 40 MG capsule TAKE 1 CAPSULE BY MOUTH DAILY 30 capsule 0   hydrOXYzine  (ATARAX /VISTARIL ) 10 MG  tablet Take 10 mg by mouth every 8 (eight) hours as needed for itching.     insulin  aspart (NOVOLOG ) 100 UNIT/ML FlexPen Inject 5-20 Units into the skin See admin instructions. Sliding scale: If blood sugar over 150, takes 5 units. Maximum 20 units.     insulin  degludec (TRESIBA ) 100 UNIT/ML FlexTouch Pen Inject 50 Units into the skin daily.     lisdexamfetamine (VYVANSE ) 40 MG capsule Take 1 capsule (40 mg total) by mouth every morning. 30 capsule 0   lisdexamfetamine (VYVANSE ) 40 MG capsule Take 1 capsule (40 mg total) by mouth daily. 30 capsule 0   NARCAN 4 MG/0.1ML LIQD nasal spray kit Place 1 spray into the nose once. Overdose     ondansetron  (ZOFRAN -ODT) 8 MG disintegrating tablet Take 8 mg by mouth 3 (three) times daily as needed.     OVER THE COUNTER MEDICATION Take 1 Dose by mouth daily as needed (nausea/vomiting).     Promethazine  HCl 6.25 MG/5ML SOLN Take 5 mLs by mouth every 8 (eight) hours as needed (cough).     QUEtiapine  (SEROQUEL ) 25 MG tablet TAKE 1 TO 2 TABLETS BY MOUTH AT BEDTIME 60 tablet 0   sucralfate  (CARAFATE ) 1 g tablet Take 1 g by mouth 4 (four) times daily.     traZODone  (DESYREL ) 100 MG tablet Take one 1/2 to one tablet at bedtime. 45 tablet 5   Turmeric 500 MG CAPS Take 500 mg by mouth daily.      No current facility-administered medications for this visit.    Medication Side Effects: None  Allergies:  Allergies  Allergen Reactions   Tapentadol Swelling and Rash   Aloe Hives   Duloxetine Other (See Comments)    RESTLESSNESS, URGES TO MOVE   Gabapentin  Swelling    SWELLING REACTION OF FEET    Olive Oil Hives   Pregabalin Other (See Comments)    Heart palpitations, Fainting, chest pain, sweats   Metoclopramide  Other (See Comments)   Zonisamide Other (See Comments)    WORSENING PAIN    Past Medical History:  Diagnosis Date   Fibromyalgia    Fibromyalgia 06/28/2019   GERD (gastroesophageal reflux disease)    Headache    Insomnia    Insulin  dependent  type 1 diabetes mellitus (HCC)    LGSIL of cervix of undetermined significance 10/2018   Colposcopy normal with ECC showing atypical fragments.  Recommend follow-up Pap smear/HPV at next annual exam.   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    Family History  Problem Relation Age of Onset   Diabetes Mother    Migraines Mother    Hypertension Mother    CVA Mother    Breast cancer Maternal Grandmother    Thyroid disease Maternal Grandmother    Cancer Maternal Grandfather     Social History   Socioeconomic History   Marital status: Single    Spouse name: Not on file   Number of children:  Not on file   Years of education: Not on file   Highest education level: Not on file  Occupational History   Not on file  Tobacco Use   Smoking status: Never   Smokeless tobacco: Never  Vaping Use   Vaping status: Never Used  Substance and Sexual Activity   Alcohol use: No    Alcohol/week: 0.0 standard drinks of alcohol   Drug use: No   Sexual activity: Yes    Birth control/protection: Implant    Comment: 1ST INTERCOURSE- 53, PARTNERS - 6 . Nexplanon  06/27/2018  Other Topics Concern   Not on file  Social History Narrative   Student.  Not married.  Lives alone in a one story home.   Not working.   Social Drivers of Corporate investment banker Strain: Not on file  Food Insecurity: Not on file  Transportation Needs: Not on file  Physical Activity: Not on file  Stress: Not on file  Social Connections: Not on file  Intimate Partner Violence: Not on file    Past Medical History, Surgical history, Social history, and Family history were reviewed and updated as appropriate.   Please see review of systems for further details on the patient's review from today.   Objective:   Physical Exam:  There were no vitals taken for this visit.  Physical Exam Constitutional:      General:  She is not in acute distress. Musculoskeletal:        General: No deformity.  Neurological:     Mental Status: She is alert and oriented to person, place, and time.     Coordination: Coordination normal.  Psychiatric:        Attention and Perception: Attention and perception normal. She does not perceive auditory or visual hallucinations.        Mood and Affect: Mood normal. Affect is not labile, blunt, angry or inappropriate.        Speech: Speech normal.        Behavior: Behavior normal.        Thought Content: Thought content normal. Thought content is not paranoid or delusional. Thought content does not include homicidal or suicidal ideation. Thought content does not include homicidal or suicidal plan.        Cognition and Memory: Cognition and memory normal.        Judgment: Judgment normal.     Comments: Insight intact     Lab Review:     Component Value Date/Time   NA 137 05/12/2020 1357   K 3.7 05/12/2020 1357   CL 106 05/12/2020 1357   CO2 22 05/12/2020 1357   GLUCOSE 121 (H) 05/12/2020 1357   BUN 7 05/12/2020 1357   CREATININE 0.78 05/12/2020 1357   CREATININE 0.55 12/25/2013 1455   CALCIUM 8.9 05/12/2020 1357   PROT 7.6 05/11/2020 1604   ALBUMIN 3.9 05/11/2020 1604   AST 14 (L) 05/11/2020 1604   ALT 13 05/11/2020 1604   ALKPHOS 87 05/11/2020 1604   BILITOT 1.3 (H) 05/11/2020 1604   GFRNONAA >60 05/12/2020 1357   GFRAA >60 07/05/2019 1254       Component Value Date/Time   WBC 9.0 05/11/2020 1604   RBC 3.86 (L) 05/11/2020 1604   HGB 12.6 05/11/2020 1753   HCT 37.0 05/11/2020 1753   PLT 457 (H) 05/11/2020 1604   MCV 95.6 05/11/2020 1604   MCV 97.7 (A) 05/24/2013 2021   MCH 30.6 05/11/2020 1604   MCHC 32.0 05/11/2020 1604  RDW 12.9 05/11/2020 1604   LYMPHSABS 3.4 10/26/2017 0607   MONOABS 0.9 10/26/2017 0607   EOSABS 0.1 10/26/2017 0607   BASOSABS 0.1 10/26/2017 0607    No results found for: "POCLITH", "LITHIUM"   No results found for: "PHENYTOIN",  "PHENOBARB", "VALPROATE", "CBMZ"   .res Assessment: Plan:    Plan:  D/C Seroquel  25mg  - take 1 to 2 at bedtime - not effective for sleep Add Ambien 10mg  at hs - start with 1/2 tablet and increase to one tablet as needed for sleep.   Prozac  40mg  daily  Wellbutrin  XL 300mg  daily - denies seizure history. Clonazepam  1mg  BID as needed  Vyvanse  40mg  daily  Discussed Spravato   RTC 4 weeks  25 minutes spent dedicated to the care of this patient on the date of this encounter to include pre-visit review of records, ordering of medication, post visit documentation, and face-to-face time with the patient discussing panic attacks, anxiety, depression, ADHD, and insomnia. Discussed discontinuing Seroquel  for sleep - ineffective.    Discussed potential benefits, risks, and side effects of stimulants with patient to include increased heart rate, palpitations, insomnia, increased anxiety, increased irritability, or decreased appetite.  Instructed patient to contact office if experiencing any significant tolerability issues.   Discussed potential benefits, risk, and side effects of benzodiazepines to include potential risk of tolerance and dependence, as well as possible drowsiness.  Advised patient not to drive if experiencing drowsiness and to take lowest possible effective dose to minimize risk of dependence and tolerance.  Diagnoses and all orders for this visit:  Attention deficit hyperactivity disorder (ADHD), combined type -     lisdexamfetamine (VYVANSE ) 40 MG capsule; Take 1 capsule (40 mg total) by mouth daily.  Insomnia, unspecified type -     zolpidem (AMBIEN) 10 MG tablet; Take 1 tablet (10 mg total) by mouth at bedtime as needed for sleep.  Panic attacks  Major depressive disorder, recurrent episode, moderate (HCC)  Generalized anxiety disorder     Please see After Visit Summary for patient specific instructions.  No future appointments.   No orders of the defined types  were placed in this encounter.     -------------------------------

## 2023-07-07 ENCOUNTER — Other Ambulatory Visit: Payer: Self-pay

## 2023-07-07 ENCOUNTER — Telehealth: Payer: Self-pay | Admitting: Adult Health

## 2023-07-07 DIAGNOSIS — F411 Generalized anxiety disorder: Secondary | ICD-10-CM

## 2023-07-07 MED ORDER — CLONAZEPAM 1 MG PO TABS: 1.0000 mg | ORAL_TABLET | Freq: Two times a day (BID) | ORAL | 0 refills | Status: DC

## 2023-07-07 NOTE — Telephone Encounter (Signed)
 Pended clonazepam  to [facility]. Asked about zolpidem  needing a PA and was told it went thru for $9. Patient notified.

## 2023-07-07 NOTE — Telephone Encounter (Signed)
 Pt called at 2:53p.  She said she forgot to ask Bonnell Butcher for the Clonazepam  yeseterday at her appt.  Looks like from an encounter, it was due to be sent on yesterday.  She said she was also told by her pharmacy that the Zolpidem  needs a PA.  She uses [facility].  Next appt 5/28

## 2023-08-03 ENCOUNTER — Encounter: Payer: Self-pay | Admitting: Adult Health

## 2023-08-03 ENCOUNTER — Telehealth: Admitting: Adult Health

## 2023-08-03 DIAGNOSIS — F331 Major depressive disorder, recurrent, moderate: Secondary | ICD-10-CM

## 2023-08-03 DIAGNOSIS — F908 Attention-deficit hyperactivity disorder, other type: Secondary | ICD-10-CM

## 2023-08-03 DIAGNOSIS — F902 Attention-deficit hyperactivity disorder, combined type: Secondary | ICD-10-CM

## 2023-08-03 DIAGNOSIS — F410 Panic disorder [episodic paroxysmal anxiety] without agoraphobia: Secondary | ICD-10-CM | POA: Diagnosis not present

## 2023-08-03 DIAGNOSIS — F32A Depression, unspecified: Secondary | ICD-10-CM | POA: Diagnosis not present

## 2023-08-03 DIAGNOSIS — F909 Attention-deficit hyperactivity disorder, unspecified type: Secondary | ICD-10-CM

## 2023-08-03 DIAGNOSIS — F411 Generalized anxiety disorder: Secondary | ICD-10-CM

## 2023-08-03 DIAGNOSIS — F419 Anxiety disorder, unspecified: Secondary | ICD-10-CM

## 2023-08-03 DIAGNOSIS — G4700 Insomnia, unspecified: Secondary | ICD-10-CM

## 2023-08-03 MED ORDER — FLUOXETINE HCL 40 MG PO CAPS: ORAL_CAPSULE | ORAL | 2 refills | Status: DC

## 2023-08-03 MED ORDER — CLONAZEPAM 1 MG PO TABS: 1.0000 mg | ORAL_TABLET | Freq: Two times a day (BID) | ORAL | 2 refills | Status: DC

## 2023-08-03 MED ORDER — BUPROPION HCL ER (XL) 300 MG PO TB24: 300.0000 mg | ORAL_TABLET | Freq: Every day | ORAL | 2 refills | Status: DC

## 2023-08-03 MED ORDER — ZOLPIDEM TARTRATE ER 12.5 MG PO TBCR: 12.5000 mg | EXTENDED_RELEASE_TABLET | Freq: Every evening | ORAL | 2 refills | Status: DC | PRN

## 2023-08-03 MED ORDER — LISDEXAMFETAMINE DIMESYLATE 40 MG PO CAPS: 40.0000 mg | ORAL_CAPSULE | ORAL | 0 refills | Status: DC

## 2023-08-03 MED ORDER — LISDEXAMFETAMINE DIMESYLATE 40 MG PO CAPS: 40.0000 mg | ORAL_CAPSULE | Freq: Every day | ORAL | 0 refills | Status: DC

## 2023-08-03 NOTE — Progress Notes (Signed)
 Theresa [name] [id number] Aug 05, 1990 33 y.o.  Virtual Visit via Video Note  I connected with pt @ on 08/03/23 at  4:30 PM EDT by a video enabled telemedicine application and verified that I am speaking with the correct person using two identifiers.   I discussed the limitations of evaluation and management by telemedicine and the availability of in person appointments. The patient expressed understanding and agreed to proceed.  I discussed the assessment and treatment plan with the patient. The patient was provided an opportunity to ask questions and all were answered. The patient agreed with the plan and demonstrated an understanding of the instructions.   The patient was advised to call back or seek an in-person evaluation if the symptoms worsen or if the condition fails to improve as anticipated.  I provided 25 minutes of non-face-to-face time during this encounter.  The patient was located at home.  The provider was located at [facility].   Reagan Camera, NP   Subjective:   Patient ID:  Theresa [name] is a 33 y.o. (DOB [birth year]) female.  Chief Complaint: No chief complaint on file.   HPI Alton Tremblay presents for follow-up of panic attacks, anxiety, depression, ADHD and insomnia.   Describes mood today as "ok". Pleasant. Reports tearfulness. Mood symptoms - reports depression, anxiety and irritability. Reports lower interest and motivation. Reports increased situational stressors. Reports panic attacks. Reports worry, rumination and over thinking. Denies obsessive thoughts and acts. Reports chronic health issues. Working with pain management. Reports mood is lower. Stating "I don't feel like I'm doing good right now". Taking other medications as prescribed.  Energy levels lower. Active, has a regular exercise routine.   Enjoys some usual interests and activities. Single. Lives with aunt and dog - "[place]". Family local.   Appetite fluctuates. Weight  gain - 210 pounds. Sleeps better some nights than others. Averages 2 to 4 hours of broken sleep over the past week. Denies daytime napping. Reports focus and concentration improved with Vyvanse . Completing tasks. Managing some aspects of household.  Denies SI or HI.  Denies AH or VH. Denies self harm. Denies substance use.  Previous medication trials: Seroquel , Ambien  10mg    Review of Systems:  Review of Systems  Musculoskeletal:  Negative for gait problem.  Neurological:  Negative for tremors.  Psychiatric/Behavioral:         Please refer to HPI    Medications: I have reviewed the patient's current medications.  Current Outpatient Medications  Medication Sig Dispense Refill   B-COMPLEX-C PO Take 1 capsule by mouth daily.      Brexpiprazole  (REXULTI ) 0.5 MG TABS Take 1 tablet (0.5 mg total) by mouth in the morning. 30 tablet 2   buPROPion  (WELLBUTRIN  XL) 300 MG 24 hr tablet Take 1 tablet (300 mg total) by mouth daily. 30 tablet 5   cholecalciferol (VITAMIN D3) 25 MCG (1000 UNIT) tablet Take 1,000 Units by mouth daily.     clonazePAM  (KLONOPIN ) 1 MG tablet Take 1 tablet (1 mg total) by mouth 2 (two) times daily. 60 tablet 0   CONTOUR NEXT TEST test strip 1 each by Other route See admin instructions. 7 times daily     dexlansoprazole (DEXILANT) 60 MG capsule Take 1 capsule by mouth daily.     diphenoxylate-atropine  (LOMOTIL) 2.5-0.025 MG tablet Take 1 tablet by mouth 4 (four) times daily as needed.     etonogestrel  (NEXPLANON ) 68 MG IMPL implant 1 each by Subdermal route continuous.  famotidine  (PEPCID ) 40 MG tablet Take 1 tablet by mouth daily.     fluconazole  (DIFLUCAN ) 200 MG tablet Take 200 mg by mouth every other day. (Patient not taking: Reported on 05/11/2020)     FLUoxetine  (PROZAC ) 40 MG capsule TAKE 1 CAPSULE BY MOUTH DAILY 30 capsule 0   hydrOXYzine  (ATARAX /VISTARIL ) 10 MG tablet Take 10 mg by mouth every 8 (eight) hours as needed for itching.     insulin  aspart  (NOVOLOG ) 100 UNIT/ML FlexPen Inject 5-20 Units into the skin See admin instructions. Sliding scale: If blood sugar over 150, takes 5 units. Maximum 20 units.     insulin  degludec (TRESIBA ) 100 UNIT/ML FlexTouch Pen Inject 50 Units into the skin daily.     lisdexamfetamine (VYVANSE ) 40 MG capsule Take 1 capsule (40 mg total) by mouth every morning. 30 capsule 0   lisdexamfetamine (VYVANSE ) 40 MG capsule Take 1 capsule (40 mg total) by mouth daily. 30 capsule 0   NARCAN 4 MG/0.1ML LIQD nasal spray kit Place 1 spray into the nose once. Overdose     ondansetron  (ZOFRAN -ODT) 8 MG disintegrating tablet Take 8 mg by mouth 3 (three) times daily as needed.     OVER THE COUNTER MEDICATION Take 1 Dose by mouth daily as needed (nausea/vomiting).     Promethazine  HCl 6.25 MG/5ML SOLN Take 5 mLs by mouth every 8 (eight) hours as needed (cough).     QUEtiapine  (SEROQUEL ) 25 MG tablet TAKE 1 TO 2 TABLETS BY MOUTH AT BEDTIME 60 tablet 0   sucralfate  (CARAFATE ) 1 g tablet Take 1 g by mouth 4 (four) times daily.     traZODone  (DESYREL ) 100 MG tablet Take one 1/2 to one tablet at bedtime. 45 tablet 5   Turmeric 500 MG CAPS Take 500 mg by mouth daily.      zolpidem  (AMBIEN ) 10 MG tablet Take 1 tablet (10 mg total) by mouth at bedtime as needed for sleep. 30 tablet 1   No current facility-administered medications for this visit.    Medication Side Effects: None  Allergies:  Allergies  Allergen Reactions   Tapentadol Swelling and Rash   Aloe Hives   Duloxetine Other (See Comments)    RESTLESSNESS, URGES TO MOVE   Gabapentin  Swelling    SWELLING REACTION OF FEET    Olive Oil Hives   Pregabalin Other (See Comments)    Heart palpitations, Fainting, chest pain, sweats   Metoclopramide  Other (See Comments)   Zonisamide Other (See Comments)    WORSENING PAIN    Past Medical History:  Diagnosis Date   Fibromyalgia    Fibromyalgia 06/28/2019   GERD (gastroesophageal reflux disease)    Headache     Insomnia    Insulin  dependent type 1 diabetes mellitus (HCC)    LGSIL of cervix of undetermined significance 10/2018   Colposcopy normal with ECC showing atypical fragments.  Recommend follow-up Pap smear/HPV at next annual exam.   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    Family History  Problem Relation Age of Onset   Diabetes Mother    Migraines Mother    Hypertension Mother    CVA Mother    Breast cancer Maternal Grandmother    Thyroid disease Maternal Grandmother    Cancer Maternal Grandfather     Social History   Socioeconomic History   Marital status: Single    Spouse name: Not  on file   Number of children: Not on file   Years of education: Not on file   Highest education level: Not on file  Occupational History   Not on file  Tobacco Use   Smoking status: Never   Smokeless tobacco: Never  Vaping Use   Vaping status: Never Used  Substance and Sexual Activity   Alcohol use: No    Alcohol/week: 0.0 standard drinks of alcohol   Drug use: No   Sexual activity: Yes    Birth control/protection: Implant    Comment: 1ST INTERCOURSE- 26, PARTNERS - 6 . Nexplanon  06/27/2018  Other Topics Concern   Not on file  Social History Narrative   Student.  Not married.  Lives alone in a one story home.   Not working.   Social Drivers of Corporate investment banker Strain: Not on file  Food Insecurity: Not on file  Transportation Needs: Not on file  Physical Activity: Not on file  Stress: Not on file  Social Connections: Not on file  Intimate Partner Violence: Not on file    Past Medical History, Surgical history, Social history, and Family history were reviewed and updated as appropriate.   Please see review of systems for further details on the patient's review from today.   Objective:   Physical Exam:  There were no vitals taken for this visit.  Physical  Exam Constitutional:      General: She is not in acute distress. Musculoskeletal:        General: No deformity.  Neurological:     Mental Status: She is alert and oriented to person, place, and time.     Coordination: Coordination normal.  Psychiatric:        Attention and Perception: Attention and perception normal. She does not perceive auditory or visual hallucinations.        Mood and Affect: Mood normal. Mood is not anxious or depressed. Affect is not labile, blunt, angry or inappropriate.        Speech: Speech normal.        Behavior: Behavior normal.        Thought Content: Thought content normal. Thought content is not paranoid or delusional. Thought content does not include homicidal or suicidal ideation. Thought content does not include homicidal or suicidal plan.        Cognition and Memory: Cognition and memory normal.        Judgment: Judgment normal.     Comments: Insight intact     Lab Review:     Component Value Date/Time   NA 137 05/12/2020 1357   K 3.7 05/12/2020 1357   CL 106 05/12/2020 1357   CO2 22 05/12/2020 1357   GLUCOSE 121 (H) 05/12/2020 1357   BUN 7 05/12/2020 1357   CREATININE 0.78 05/12/2020 1357   CREATININE 0.55 12/25/2013 1455   CALCIUM 8.9 05/12/2020 1357   PROT 7.6 05/11/2020 1604   ALBUMIN 3.9 05/11/2020 1604   AST 14 (L) 05/11/2020 1604   ALT 13 05/11/2020 1604   ALKPHOS 87 05/11/2020 1604   BILITOT 1.3 (H) 05/11/2020 1604   GFRNONAA >60 05/12/2020 1357   GFRAA >60 07/05/2019 1254       Component Value Date/Time   WBC 9.0 05/11/2020 1604   RBC 3.86 (L) 05/11/2020 1604   HGB 12.6 05/11/2020 1753   HCT 37.0 05/11/2020 1753   PLT 457 (H) 05/11/2020 1604   MCV 95.6 05/11/2020 1604   MCV 97.7 (A) 05/24/2013 2021  MCH 30.6 05/11/2020 1604   MCHC 32.0 05/11/2020 1604   RDW 12.9 05/11/2020 1604   LYMPHSABS 3.4 10/26/2017 0607   MONOABS 0.9 10/26/2017 0607   EOSABS 0.1 10/26/2017 0607   BASOSABS 0.1 10/26/2017 0607    No results  found for: "POCLITH", "LITHIUM"   No results found for: "PHENYTOIN", "PHENOBARB", "VALPROATE", "CBMZ"   .res Assessment: Plan:    Plan:  Prozac  40mg  daily  Wellbutrin  XL 300mg  daily - denies seizure history. Clonazepam  1mg  BID as needed Vyvanse  40mg  daily  Discussed Spravato   RTC 4 weeks  25 minutes spent dedicated to the care of this patient on the date of this encounter to include pre-visit review of records, ordering of medication, post visit documentation, and face-to-face time with the patient discussing panic attacks, anxiety, depression, ADHD, and insomnia. Discussed sleep aids to help manage sleep issues.    Discussed potential benefits, risks, and side effects of stimulants with patient to include increased heart rate, palpitations, insomnia, increased anxiety, increased irritability, or decreased appetite.  Instructed patient to contact office if experiencing any significant tolerability issues.   Discussed potential benefits, risk, and side effects of benzodiazepines to include potential risk of tolerance and dependence, as well as possible drowsiness.  Advised patient not to drive if experiencing drowsiness and to take lowest possible effective dose to minimize risk of dependence and tolerance.  There are no diagnoses linked to this encounter.   Please see After Visit Summary for patient specific instructions.  Future Appointments  Date Time Provider Department Center  08/03/2023  4:30 PM Kharis Lapenna Nattalie, NP [facility] None    No orders of the defined types were placed in this encounter.     -------------------------------

## 2023-10-04 ENCOUNTER — Telehealth (INDEPENDENT_AMBULATORY_CARE_PROVIDER_SITE_OTHER): Payer: Self-pay | Admitting: Adult Health

## 2023-10-04 ENCOUNTER — Encounter: Payer: Self-pay | Admitting: Adult Health

## 2023-10-04 DIAGNOSIS — Z0389 Encounter for observation for other suspected diseases and conditions ruled out: Secondary | ICD-10-CM

## 2023-10-04 NOTE — Progress Notes (Signed)
 Patient no show appointment. Did not connect to video apt. Called and LVM.

## 2023-11-03 ENCOUNTER — Other Ambulatory Visit: Payer: Self-pay | Admitting: Adult Health

## 2023-11-03 DIAGNOSIS — F411 Generalized anxiety disorder: Secondary | ICD-10-CM

## 2023-11-03 DIAGNOSIS — F902 Attention-deficit hyperactivity disorder, combined type: Secondary | ICD-10-CM

## 2023-11-04 NOTE — Telephone Encounter (Signed)
 Lvm for patient to call and schedule

## 2023-11-04 NOTE — Telephone Encounter (Signed)
 Pt called at 4:25p to request refill on Clonazepam  and Vyvanse  to   [id number] [place], [place] - [address] AT [facility] OF [facility] & [address] [place] [address] Phone: [phone]  Fax: [phone]   Next appt 9/12

## 2023-11-04 NOTE — Telephone Encounter (Signed)
Please call to schedule FU, past due

## 2023-11-08 MED ORDER — LISDEXAMFETAMINE DIMESYLATE 40 MG PO CAPS
40.0000 mg | ORAL_CAPSULE | ORAL | 0 refills | Status: DC
Start: 2023-11-08 — End: 2023-11-18

## 2023-11-18 ENCOUNTER — Telehealth: Admitting: Adult Health

## 2023-11-18 ENCOUNTER — Encounter: Payer: Self-pay | Admitting: Adult Health

## 2023-11-18 DIAGNOSIS — F419 Anxiety disorder, unspecified: Secondary | ICD-10-CM | POA: Diagnosis not present

## 2023-11-18 DIAGNOSIS — F32A Depression, unspecified: Secondary | ICD-10-CM | POA: Diagnosis not present

## 2023-11-18 DIAGNOSIS — F908 Attention-deficit hyperactivity disorder, other type: Secondary | ICD-10-CM

## 2023-11-18 DIAGNOSIS — F909 Attention-deficit hyperactivity disorder, unspecified type: Secondary | ICD-10-CM | POA: Diagnosis not present

## 2023-11-18 DIAGNOSIS — F411 Generalized anxiety disorder: Secondary | ICD-10-CM

## 2023-11-18 DIAGNOSIS — G4700 Insomnia, unspecified: Secondary | ICD-10-CM

## 2023-11-18 DIAGNOSIS — F902 Attention-deficit hyperactivity disorder, combined type: Secondary | ICD-10-CM

## 2023-11-18 DIAGNOSIS — F410 Panic disorder [episodic paroxysmal anxiety] without agoraphobia: Secondary | ICD-10-CM | POA: Diagnosis not present

## 2023-11-18 DIAGNOSIS — F331 Major depressive disorder, recurrent, moderate: Secondary | ICD-10-CM

## 2023-11-18 MED ORDER — CLONAZEPAM 1 MG PO TABS: 1.0000 mg | ORAL_TABLET | Freq: Two times a day (BID) | ORAL | 2 refills | Status: DC | PRN

## 2023-11-18 MED ORDER — FLUOXETINE HCL 40 MG PO CAPS: ORAL_CAPSULE | ORAL | 2 refills | Status: DC

## 2023-11-18 MED ORDER — LISDEXAMFETAMINE DIMESYLATE 40 MG PO CAPS: 40.0000 mg | ORAL_CAPSULE | ORAL | 0 refills | Status: DC

## 2023-11-18 MED ORDER — BUPROPION HCL ER (XL) 300 MG PO TB24: 300.0000 mg | ORAL_TABLET | Freq: Every day | ORAL | 2 refills | Status: DC

## 2023-11-18 MED ORDER — LISDEXAMFETAMINE DIMESYLATE 40 MG PO CAPS: 40.0000 mg | ORAL_CAPSULE | Freq: Every day | ORAL | 0 refills | Status: AC

## 2023-11-18 MED ORDER — LISDEXAMFETAMINE DIMESYLATE 40 MG PO CAPS: 40.0000 mg | ORAL_CAPSULE | ORAL | 0 refills | Status: AC

## 2023-11-18 MED ORDER — BELSOMRA 10 MG PO TABS: 10.0000 mg | ORAL_TABLET | Freq: Every day | ORAL | 0 refills | Status: DC

## 2023-11-18 NOTE — Progress Notes (Signed)
 Theresa [name] [id number] [birth year] 33 y.o.  Virtual Visit via Video Note  I connected with pt @ on 11/18/23 at  3:30 PM EDT by a video enabled telemedicine application and verified that I am speaking with the correct person using two identifiers.   I discussed the limitations of evaluation and management by telemedicine and the availability of in person appointments. The patient expressed understanding and agreed to proceed.  I discussed the assessment and treatment plan with the patient. The patient was provided an opportunity to ask questions and all were answered. The patient agreed with the plan and demonstrated an understanding of the instructions.   The patient was advised to call back or seek an in-person evaluation if the symptoms worsen or if the condition fails to improve as anticipated.  I provided 25 minutes of non-face-to-face time during this encounter.  The patient was located at home.  The provider was located at [facility].   Angeline LOISE Sayers, NP   Subjective:   Patient ID:  Theresa [name] is a 32 y.o. (DOB [birth year]) female.  Chief Complaint: No chief complaint on file.   HPI Theresa [name] presents for follow-up of panic attacks, anxiety, depression, ADHD and insomnia.   Describes mood today as ok. Pleasant. Reports tearfulness. Mood symptoms - reports some depression, anxiety and irritability. Reports stable interest and motivation. Reports decreased situational stressors. Reports panic attacks. Reports worry, rumination and over thinking. Denies obsessive thoughts and acts. Reports chronic health issues. Working with pain management. Reports mood is lower. Stating I don't feel like I'm doing that good. Taking other medications as prescribed.  Energy levels lower. Active, does not have a regular exercise routine.   Enjoys some usual interests and activities. Single. Lives with aunt and dog - Drue. Family local.   Appetite  fluctuates. Weight gain - 210 to 220 pounds. Reports sleeping difficulties - unable to sleep at night. Averages 4 hours of sleep during the day. Denies napping. Reports focus and concentration improved with Vyvanse . Completing tasks. Managing some aspects of household.  Denies SI or HI.  Denies AH or VH. Denies self harm. Denies substance use.  Previous medication trials: Seroquel , Ambien  10mg , Lunesta  Review of Systems:  Review of Systems  Musculoskeletal:  Negative for gait problem.  Neurological:  Negative for tremors.  Psychiatric/Behavioral:         Please refer to HPI   Medications: I have reviewed the patient's current medications.  Current Outpatient Medications  Medication Sig Dispense Refill   B-COMPLEX-C PO Take 1 capsule by mouth daily.      Brexpiprazole  (REXULTI ) 0.5 MG TABS Take 1 tablet (0.5 mg total) by mouth in the morning. 30 tablet 2   buPROPion  (WELLBUTRIN  XL) 300 MG 24 hr tablet Take 1 tablet (300 mg total) by mouth daily. 30 tablet 2   cholecalciferol (VITAMIN D3) 25 MCG (1000 UNIT) tablet Take 1,000 Units by mouth daily.     clonazePAM  (KLONOPIN ) 1 MG tablet TAKE 1 TABLET(1 MG) BY MOUTH TWICE DAILY 20 tablet 0   CONTOUR NEXT TEST test strip 1 each by Other route See admin instructions. 7 times daily     dexlansoprazole (DEXILANT) 60 MG capsule Take 1 capsule by mouth daily.     diphenoxylate-atropine  (LOMOTIL) 2.5-0.025 MG tablet Take 1 tablet by mouth 4 (four) times daily as needed.     etonogestrel  (NEXPLANON ) 68 MG IMPL implant 1 each by Subdermal route continuous.      famotidine  (PEPCID )  40 MG tablet Take 1 tablet by mouth daily.     fluconazole  (DIFLUCAN ) 200 MG tablet Take 200 mg by mouth every other day. (Patient not taking: Reported on 05/11/2020)     FLUoxetine  (PROZAC ) 40 MG capsule TAKE 1 CAPSULE BY MOUTH DAILY 30 capsule 2   hydrOXYzine  (ATARAX /VISTARIL ) 10 MG tablet Take 10 mg by mouth every 8 (eight) hours as needed for itching.     insulin   aspart (NOVOLOG ) 100 UNIT/ML FlexPen Inject 5-20 Units into the skin See admin instructions. Sliding scale: If blood sugar over 150, takes 5 units. Maximum 20 units.     insulin  degludec (TRESIBA ) 100 UNIT/ML FlexTouch Pen Inject 50 Units into the skin daily.     lisdexamfetamine (VYVANSE ) 40 MG capsule Take 1 capsule (40 mg total) by mouth daily. 30 capsule 0   lisdexamfetamine (VYVANSE ) 40 MG capsule Take 1 capsule (40 mg total) by mouth every morning. 30 capsule 0   lisdexamfetamine (VYVANSE ) 40 MG capsule Take 1 capsule (40 mg total) by mouth every morning. 10 capsule 0   NARCAN 4 MG/0.1ML LIQD nasal spray kit Place 1 spray into the nose once. Overdose     ondansetron  (ZOFRAN -ODT) 8 MG disintegrating tablet Take 8 mg by mouth 3 (three) times daily as needed.     OVER THE COUNTER MEDICATION Take 1 Dose by mouth daily as needed (nausea/vomiting).     Promethazine  HCl 6.25 MG/5ML SOLN Take 5 mLs by mouth every 8 (eight) hours as needed (cough).     sucralfate  (CARAFATE ) 1 g tablet Take 1 g by mouth 4 (four) times daily.     traZODone  (DESYREL ) 100 MG tablet Take one 1/2 to one tablet at bedtime. 45 tablet 5   Turmeric 500 MG CAPS Take 500 mg by mouth daily.      zolpidem  (AMBIEN  CR) 12.5 MG CR tablet Take 1 tablet (12.5 mg total) by mouth at bedtime as needed for sleep. 30 tablet 2   No current facility-administered medications for this visit.    Medication Side Effects: None  Allergies:  Allergies  Allergen Reactions   Tapentadol Swelling and Rash   Aloe Hives   Duloxetine Other (See Comments)    RESTLESSNESS, URGES TO MOVE   Gabapentin  Swelling    SWELLING REACTION OF FEET    Olive Oil Hives   Pregabalin Other (See Comments)    Heart palpitations, Fainting, chest pain, sweats   Metoclopramide  Other (See Comments)   Zonisamide Other (See Comments)    WORSENING PAIN    Past Medical History:  Diagnosis Date   Fibromyalgia    Fibromyalgia 06/28/2019   GERD (gastroesophageal  reflux disease)    Headache    Insomnia    Insulin  dependent type 1 diabetes mellitus (HCC)    LGSIL of cervix of undetermined significance 10/2018   Colposcopy normal with ECC showing atypical fragments.  Recommend follow-up Pap smear/HPV at next annual exam.   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    Family History  Problem Relation Age of Onset   Diabetes Mother    Migraines Mother    Hypertension Mother    CVA Mother    Breast cancer Maternal Grandmother    Thyroid disease Maternal Grandmother    Cancer Maternal Grandfather     Social History   Socioeconomic History   Marital status: Single    Spouse name:  Not on file   Number of children: Not on file   Years of education: Not on file   Highest education level: Not on file  Occupational History   Not on file  Tobacco Use   Smoking status: Never   Smokeless tobacco: Never  Vaping Use   Vaping status: Never Used  Substance and Sexual Activity   Alcohol use: No    Alcohol/week: 0.0 standard drinks of alcohol   Drug use: No   Sexual activity: Yes    Birth control/protection: Implant    Comment: 1ST INTERCOURSE- 69, PARTNERS - 6 . Nexplanon  06/27/2018  Other Topics Concern   Not on file  Social History Narrative   Student.  Not married.  Lives alone in a one story home.   Not working.   Social Drivers of Corporate investment banker Strain: Not on file  Food Insecurity: Not on file  Transportation Needs: Not on file  Physical Activity: Not on file  Stress: Not on file  Social Connections: Not on file  Intimate Partner Violence: Not on file    Past Medical History, Surgical history, Social history, and Family history were reviewed and updated as appropriate.   Please see review of systems for further details on the patient's review from today.   Objective:   Physical Exam:  There were no vitals taken  for this visit.  Physical Exam Constitutional:      General: She is not in acute distress. Musculoskeletal:        General: No deformity.  Neurological:     Mental Status: She is alert and oriented to person, place, and time.     Coordination: Coordination normal.  Psychiatric:        Attention and Perception: Attention and perception normal. She does not perceive auditory or visual hallucinations.        Mood and Affect: Mood normal. Mood is not anxious or depressed. Affect is not labile, blunt, angry or inappropriate.        Speech: Speech normal.        Behavior: Behavior normal.        Thought Content: Thought content normal. Thought content is not paranoid or delusional. Thought content does not include homicidal or suicidal ideation. Thought content does not include homicidal or suicidal plan.        Cognition and Memory: Cognition and memory normal.        Judgment: Judgment normal.     Comments: Insight intact     Lab Review:     Component Value Date/Time   NA 137 05/12/2020 1357   K 3.7 05/12/2020 1357   CL 106 05/12/2020 1357   CO2 22 05/12/2020 1357   GLUCOSE 121 (H) 05/12/2020 1357   BUN 7 05/12/2020 1357   CREATININE 0.78 05/12/2020 1357   CREATININE 0.55 12/25/2013 1455   CALCIUM 8.9 05/12/2020 1357   PROT 7.6 05/11/2020 1604   ALBUMIN 3.9 05/11/2020 1604   AST 14 (L) 05/11/2020 1604   ALT 13 05/11/2020 1604   ALKPHOS 87 05/11/2020 1604   BILITOT 1.3 (H) 05/11/2020 1604   GFRNONAA >60 05/12/2020 1357   GFRAA >60 07/05/2019 1254       Component Value Date/Time   WBC 9.0 05/11/2020 1604   RBC 3.86 (L) 05/11/2020 1604   HGB 12.6 05/11/2020 1753   HCT 37.0 05/11/2020 1753   PLT 457 (H) 05/11/2020 1604   MCV 95.6 05/11/2020 1604   MCV 97.7 (A) 05/24/2013 2021  MCH 30.6 05/11/2020 1604   MCHC 32.0 05/11/2020 1604   RDW 12.9 05/11/2020 1604   LYMPHSABS 3.4 10/26/2017 0607   MONOABS 0.9 10/26/2017 0607   EOSABS 0.1 10/26/2017 0607   BASOSABS 0.1  10/26/2017 0607    No results found for: POCLITH, LITHIUM   No results found for: PHENYTOIN, PHENOBARB, VALPROATE, CBMZ   .res Assessment: Plan:    Plan:  Add Belsomra  10mg  at bedtime - took in the past and feels like it was helpful.  Prozac  40mg  daily  Wellbutrin  XL 300mg  daily - denies seizure history. Clonazepam  1mg  BID as needed Vyvanse  40mg  daily  Discussed Spravato   RTC 4 weeks  25 minutes spent dedicated to the care of this patient on the date of this encounter to include pre-visit review of records, ordering of medication, post visit documentation, and face-to-face time with the patient discussing panic attacks, anxiety, depression, ADHD, and insomnia. Discussed sleep aids to help manage sleep issues.    Discussed potential benefits, risks, and side effects of stimulants with patient to include increased heart rate, palpitations, insomnia, increased anxiety, increased irritability, or decreased appetite.  Instructed patient to contact office if experiencing any significant tolerability issues.   Discussed potential benefits, risk, and side effects of benzodiazepines to include potential risk of tolerance and dependence, as well as possible drowsiness.  Advised patient not to drive if experiencing drowsiness and to take lowest possible effective dose to minimize risk of dependence and tolerance.  There are no diagnoses linked to this encounter.   Please see After Visit Summary for patient specific instructions.  Future Appointments  Date Time Provider Department Center  11/18/2023  3:30 PM Ziyonna Christner Nattalie, NP [facility] None    No orders of the defined types were placed in this encounter.     -------------------------------

## 2023-12-30 ENCOUNTER — Other Ambulatory Visit: Payer: Self-pay

## 2023-12-30 ENCOUNTER — Telehealth: Payer: Self-pay | Admitting: Adult Health

## 2023-12-30 ENCOUNTER — Telehealth: Admitting: Adult Health

## 2023-12-30 DIAGNOSIS — F908 Attention-deficit hyperactivity disorder, other type: Secondary | ICD-10-CM

## 2023-12-30 NOTE — Progress Notes (Deleted)
 Theresa [name] [id number] 01/10/1991 33 y.o.  Virtual Visit via Video Note  I connected with pt @ on 12/30/23 at  3:30 PM EDT by a video enabled telemedicine application and verified that I am speaking with the correct person using two identifiers.   I discussed the limitations of evaluation and management by telemedicine and the availability of in person appointments. The patient expressed understanding and agreed to proceed.  I discussed the assessment and treatment plan with the patient. The patient was provided an opportunity to ask questions and all were answered. The patient agreed with the plan and demonstrated an understanding of the instructions.   The patient was advised to call back or seek an in-person evaluation if the symptoms worsen or if the condition fails to improve as anticipated.  I provided *** minutes of non-face-to-face time during this encounter.  The patient was located at home.  The provider was located at [facility].   Theresa LOISE Sayers, NP   Subjective:   Patient ID:  Theresa [name] is a 33 y.o. (DOB 1990-09-08) female.  Chief Complaint: No chief complaint on file.   HPI Theresa [name] presents for follow-up of ***   Review of Systems:  Review of Systems  Medications: {medication reviewed/display:3041432}  Current Outpatient Medications  Medication Sig Dispense Refill  . B-COMPLEX-C PO Take 1 capsule by mouth daily.     . Brexpiprazole  (REXULTI ) 0.5 MG TABS Take 1 tablet (0.5 mg total) by mouth in the morning. 30 tablet 2  . buPROPion  (WELLBUTRIN  XL) 300 MG 24 hr tablet Take 1 tablet (300 mg total) by mouth daily. 30 tablet 2  . cholecalciferol (VITAMIN D3) 25 MCG (1000 UNIT) tablet Take 1,000 Units by mouth daily.    . clonazePAM  (KLONOPIN ) 1 MG tablet Take 1 tablet (1 mg total) by mouth 2 (two) times daily as needed. 60 tablet 2  . CONTOUR NEXT TEST test strip 1 each by Other route See admin instructions. 7 times daily    .  dexlansoprazole (DEXILANT) 60 MG capsule Take 1 capsule by mouth daily.    . diphenoxylate-atropine  (LOMOTIL) 2.5-0.025 MG tablet Take 1 tablet by mouth 4 (four) times daily as needed.    . etonogestrel  (NEXPLANON ) 68 MG IMPL implant 1 each by Subdermal route continuous.     . famotidine  (PEPCID ) 40 MG tablet Take 1 tablet by mouth daily.    . fluconazole  (DIFLUCAN ) 200 MG tablet Take 200 mg by mouth every other day. (Patient not taking: Reported on 05/11/2020)    . FLUoxetine  (PROZAC ) 40 MG capsule TAKE 1 CAPSULE BY MOUTH DAILY 30 capsule 2  . hydrOXYzine  (ATARAX /VISTARIL ) 10 MG tablet Take 10 mg by mouth every 8 (eight) hours as needed for itching.    . insulin  aspart (NOVOLOG ) 100 UNIT/ML FlexPen Inject 5-20 Units into the skin See admin instructions. Sliding scale: If blood sugar over 150, takes 5 units. Maximum 20 units.    . insulin  degludec (TRESIBA ) 100 UNIT/ML FlexTouch Pen Inject 50 Units into the skin daily.    SABRA lisdexamfetamine (VYVANSE ) 40 MG capsule Take 1 capsule (40 mg total) by mouth daily. 30 capsule 0  . lisdexamfetamine (VYVANSE ) 40 MG capsule Take 1 capsule (40 mg total) by mouth every morning. 30 capsule 0  . [START ON 01/13/2024] lisdexamfetamine (VYVANSE ) 40 MG capsule Take 1 capsule (40 mg total) by mouth every morning. 30 capsule 0  . NARCAN 4 MG/0.1ML LIQD nasal spray kit Place 1 spray into the  nose once. Overdose    . ondansetron  (ZOFRAN -ODT) 8 MG disintegrating tablet Take 8 mg by mouth 3 (three) times daily as needed.    SABRA OVER THE COUNTER MEDICATION Take 1 Dose by mouth daily as needed (nausea/vomiting).    . Promethazine  HCl 6.25 MG/5ML SOLN Take 5 mLs by mouth every 8 (eight) hours as needed (cough).    . sucralfate  (CARAFATE ) 1 g tablet Take 1 g by mouth 4 (four) times daily.    . Suvorexant  (BELSOMRA ) 10 MG TABS Take 1 tablet (10 mg total) by mouth at bedtime. 30 tablet 0  . traZODone  (DESYREL ) 100 MG tablet Take one 1/2 to one tablet at bedtime. 45 tablet 5  .  Turmeric 500 MG CAPS Take 500 mg by mouth daily.     . zolpidem  (AMBIEN  CR) 12.5 MG CR tablet Take 1 tablet (12.5 mg total) by mouth at bedtime as needed for sleep. 30 tablet 2   No current facility-administered medications for this visit.    Medication Side Effects: {Medication Side Effects (Optional):21014029}  Allergies:  Allergies  Allergen Reactions  . Tapentadol Swelling and Rash  . Aloe Hives  . Duloxetine Other (See Comments)    RESTLESSNESS, URGES TO MOVE  . Gabapentin  Swelling    SWELLING REACTION OF FEET   . Olive Oil Hives  . Pregabalin Other (See Comments)    Heart palpitations, Fainting, chest pain, sweats  . Metoclopramide  Other (See Comments)  . Zonisamide Other (See Comments)    WORSENING PAIN    Past Medical History:  Diagnosis Date  . Fibromyalgia   . Fibromyalgia 06/28/2019  . GERD (gastroesophageal reflux disease)   . Headache   . Insomnia   . Insulin  dependent type 1 diabetes mellitus (HCC)   . 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  Recommend follow-up Pap smear/HPV at next annual exam.  . Nausea & vomiting 04/08/2020  . Peripheral neuropathy   . Seasonal allergies   . 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 Diabetes Mother   . Migraines Mother   . Hypertension Mother   . CVA Mother   . Breast cancer Maternal Grandmother   . Thyroid disease Maternal Grandmother   . Cancer Maternal Grandfather     Social History   Socioeconomic History  . Marital status: Single    Spouse name: Not on file  . Number of children: Not on file  . Years of education: Not on file  . Highest education level: Not on file  Occupational History  . Not on file  Tobacco Use  . Smoking status: Never  . Smokeless tobacco: Never  Vaping Use  . Vaping status: Never Used  Substance and Sexual Activity  . Alcohol use: No     Alcohol/week: 0.0 standard drinks of alcohol  . Drug use: No  . Sexual activity: Yes    Birth control/protection: Implant    Comment: 1ST INTERCOURSE- 14, PARTNERS - 6 . Nexplanon  06/27/2018  Other Topics Concern  . Not on file  Social History Narrative   Student.  Not married.  Lives alone in a one story home.   Not working.   Social Drivers of Corporate investment banker Strain: Not on file  Food Insecurity: Not on file  Transportation Needs: Not on file  Physical Activity: Not on file  Stress: Not on  file  Social Connections: Not on file  Intimate Partner Violence: Not on file    Past Medical History, Surgical history, Social history, and Family history were reviewed and updated as appropriate.   Please see review of systems for further details on the patient's review from today.   Objective:   Physical Exam:  There were no vitals taken for this visit.  Physical Exam  Lab Review:     Component Value Date/Time   NA 137 05/12/2020 1357   K 3.7 05/12/2020 1357   CL 106 05/12/2020 1357   CO2 22 05/12/2020 1357   GLUCOSE 121 (H) 05/12/2020 1357   BUN 7 05/12/2020 1357   CREATININE 0.78 05/12/2020 1357   CREATININE 0.55 12/25/2013 1455   CALCIUM 8.9 05/12/2020 1357   PROT 7.6 05/11/2020 1604   ALBUMIN 3.9 05/11/2020 1604   AST 14 (L) 05/11/2020 1604   ALT 13 05/11/2020 1604   ALKPHOS 87 05/11/2020 1604   BILITOT 1.3 (H) 05/11/2020 1604   GFRNONAA >60 05/12/2020 1357   GFRAA >60 07/05/2019 1254       Component Value Date/Time   WBC 9.0 05/11/2020 1604   RBC 3.86 (L) 05/11/2020 1604   HGB 12.6 05/11/2020 1753   HCT 37.0 05/11/2020 1753   PLT 457 (H) 05/11/2020 1604   MCV 95.6 05/11/2020 1604   MCV 97.7 (A) 05/24/2013 2021   MCH 30.6 05/11/2020 1604   MCHC 32.0 05/11/2020 1604   RDW 12.9 05/11/2020 1604   LYMPHSABS 3.4 10/26/2017 0607   MONOABS 0.9 10/26/2017 0607   EOSABS 0.1 10/26/2017 0607   BASOSABS 0.1 10/26/2017 0607    No results found for:  POCLITH, LITHIUM   No results found for: PHENYTOIN, PHENOBARB, VALPROATE, CBMZ   .res Assessment: Plan:    There are no diagnoses linked to this encounter.   Please see After Visit Summary for patient specific instructions.  Future Appointments  Date Time Provider Department Center  12/30/2023  3:30 PM Skyra Crichlow Nattalie, NP [facility] None    No orders of the defined types were placed in this encounter.     -------------------------------

## 2023-12-30 NOTE — Telephone Encounter (Signed)
 Pended

## 2023-12-30 NOTE — Telephone Encounter (Signed)
 Theresa [name] needs a refill on her belsomra  10 mg. Pharmacy is [facility] on [address] Adelene had computer issues today so she is rs for 10/30

## 2024-01-01 MED ORDER — BELSOMRA 10 MG PO TABS: 10.0000 mg | ORAL_TABLET | Freq: Every day | ORAL | 0 refills | Status: AC

## 2024-01-01 NOTE — Progress Notes (Signed)
 System down - pt r/s.

## 2024-01-05 ENCOUNTER — Telehealth: Admitting: Adult Health

## 2024-01-05 ENCOUNTER — Encounter: Payer: Self-pay | Admitting: Adult Health

## 2024-01-05 DIAGNOSIS — G4700 Insomnia, unspecified: Secondary | ICD-10-CM | POA: Diagnosis not present

## 2024-01-05 DIAGNOSIS — F411 Generalized anxiety disorder: Secondary | ICD-10-CM

## 2024-01-05 DIAGNOSIS — F902 Attention-deficit hyperactivity disorder, combined type: Secondary | ICD-10-CM

## 2024-01-05 DIAGNOSIS — F331 Major depressive disorder, recurrent, moderate: Secondary | ICD-10-CM

## 2024-01-05 DIAGNOSIS — F410 Panic disorder [episodic paroxysmal anxiety] without agoraphobia: Secondary | ICD-10-CM

## 2024-01-05 MED ORDER — CLONAZEPAM 1 MG PO TABS: 1.0000 mg | ORAL_TABLET | Freq: Three times a day (TID) | ORAL | 0 refills | Status: DC | PRN

## 2024-01-05 MED ORDER — HYDROXYZINE HCL 50 MG PO TABS: 50.0000 mg | ORAL_TABLET | Freq: Every evening | ORAL | 0 refills | Status: DC | PRN

## 2024-01-05 NOTE — Progress Notes (Signed)
 Theresa [name] 33 y.o.  Virtual Visit via Video Note  I connected with pt @ on 01/05/24 at  4:00 PM EDT by a video enabled telemedicine application and verified that I am speaking with the correct person using two identifiers.   I discussed the limitations of evaluation and management by telemedicine and the availability of in person appointments. The patient expressed understanding and agreed to proceed.  I discussed the assessment and treatment plan with the patient. The patient was provided an opportunity to ask questions and all were answered. The patient agreed with the plan and demonstrated an understanding of the instructions.   The patient was advised to call back or seek an in-person evaluation if the symptoms worsen or if the condition fails to improve as anticipated.  I provided 25 minutes of non-face-to-face time during this encounter.  The patient was located at home.  The provider was located at [facility].   Theresa LOISE Sayers, NP   Subjective:   Patient ID:  Theresa [name] is a 33 y.o. (DOB 1991-03-01) female.  Chief Complaint: No chief complaint on file.   HPI Theresa [name] presents for follow-up of panic attacks, anxiety, depression, ADHD and insomnia.   Describes mood today as not good. Pleasant. Reports tearfulness. Mood symptoms - reports increased depression, anxiety and irritability with recent loss of dog. Reports her dog Theresa [name] passed away when she was on her way to pick him up from the veterinarian. Reports holidays are difficult generally. Reports lower interest and motivation. Reports ongoing situational stressors. Reports panic attacks - high state of anxiety. Reports decreased worry, rumination and over thinking. Denies obsessive thoughts and acts. Reports chronic health issues. Working with pain management. Reports mood is lower. Stating I don't feel like I'm doing too good all. Taking other medications as  prescribed.  Energy levels lower. Active, does not have a regular exercise routine.   Reports she is unable to enjoy usual interests and activities. Single. Lives with aunt. Family local.   Appetite fluctuates. Weight gain - 220 pounds. Reports sleeping difficulties - worse with recent loss of dog. Reports broken sleep. Does not feel like her current sleep aid is helpful. Denies napping. Reports focus and concentration improved with Vyvanse . Completing tasks. Managing some aspects of household.  Denies SI or HI.  Denies AH or VH. Denies self harm. Denies substance use.  Previous medication trials: Hydroxyzine , Seroquel , Ambien  10mg , Lunesta, Elavil , Cymbalta, gabapentin  caused swelling in her lower extremities, Horizant, Topamax, baclofen, Lunesta, Belsomra  was effective, trazodone  caused increased glucose Theresa [name] caused increased glucose, Modafinil  did not help, Wellbutrin .  Review of Systems:  Review of Systems  Musculoskeletal:  Negative for gait problem.  Neurological:  Negative for tremors.  Psychiatric/Behavioral:         Please refer to HPI    Medications: I have reviewed the patient's current medications.  Current Outpatient Medications  Medication Sig Dispense Refill   hydrOXYzine  (ATARAX ) 50 MG tablet Take 1 tablet (50 mg total) by mouth at bedtime as needed. 30 tablet 0   B-COMPLEX-C PO Take 1 capsule by mouth daily.      buPROPion  (WELLBUTRIN  XL) 300 MG 24 hr tablet Take 1 tablet (300 mg total) by mouth daily. 30 tablet 2   cholecalciferol (VITAMIN D3) 25 MCG (1000 UNIT) tablet Take 1,000 Units by mouth daily.     clonazePAM  (KLONOPIN ) 1 MG tablet Take 1 tablet (1 mg total) by mouth 3 (three) times daily as needed.  21 tablet 0   CONTOUR NEXT TEST test strip 1 each by Other route See admin instructions. 7 times daily     dexlansoprazole (DEXILANT) 60 MG capsule Take 1 capsule by mouth daily.     diphenoxylate-atropine  (LOMOTIL) 2.5-0.025 MG tablet Take 1 tablet by mouth 4  (four) times daily as needed.     etonogestrel  (NEXPLANON ) 68 MG IMPL implant 1 each by Subdermal route continuous.      famotidine  (PEPCID ) 40 MG tablet Take 1 tablet by mouth daily.     fluconazole  (DIFLUCAN ) 200 MG tablet Take 200 mg by mouth every other day. (Patient not taking: Reported on 05/11/2020)     FLUoxetine  (PROZAC ) 40 MG capsule TAKE 1 CAPSULE BY MOUTH DAILY 30 capsule 2   hydrOXYzine  (ATARAX /VISTARIL ) 10 MG tablet Take 10 mg by mouth every 8 (eight) hours as needed for itching.     insulin  aspart (NOVOLOG ) 100 UNIT/ML FlexPen Inject 5-20 Units into the skin See admin instructions. Sliding scale: If blood sugar over 150, takes 5 units. Maximum 20 units.     insulin  degludec (TRESIBA ) 100 UNIT/ML FlexTouch Pen Inject 50 Units into the skin daily.     lisdexamfetamine (VYVANSE ) 40 MG capsule Take 1 capsule (40 mg total) by mouth daily. 30 capsule 0   lisdexamfetamine (VYVANSE ) 40 MG capsule Take 1 capsule (40 mg total) by mouth every morning. 30 capsule 0   [START ON 01/13/2024] lisdexamfetamine (VYVANSE ) 40 MG capsule Take 1 capsule (40 mg total) by mouth every morning. 30 capsule 0   NARCAN 4 MG/0.1ML LIQD nasal spray kit Place 1 spray into the nose once. Overdose     ondansetron  (ZOFRAN -ODT) 8 MG disintegrating tablet Take 8 mg by mouth 3 (three) times daily as needed.     OVER THE COUNTER MEDICATION Take 1 Dose by mouth daily as needed (nausea/vomiting).     Promethazine  HCl 6.25 MG/5ML SOLN Take 5 mLs by mouth every 8 (eight) hours as needed (cough).     sucralfate  (CARAFATE ) 1 g tablet Take 1 g by mouth 4 (four) times daily.     Suvorexant  (BELSOMRA ) 10 MG TABS Take 1 tablet (10 mg total) by mouth at bedtime. 30 tablet 0   Turmeric 500 MG CAPS Take 500 mg by mouth daily.      No current facility-administered medications for this visit.    Medication Side Effects: None  Allergies:  Allergies  Allergen Reactions   Tapentadol Swelling and Rash   Aloe Hives   Duloxetine Other  (See Comments)    RESTLESSNESS, URGES TO MOVE   Gabapentin  Swelling    SWELLING REACTION OF FEET    Olive Oil Hives   Pregabalin Other (See Comments)    Heart palpitations, Fainting, chest pain, sweats   Metoclopramide  Other (See Comments)   Zonisamide Other (See Comments)    WORSENING PAIN    Past Medical History:  Diagnosis Date   Fibromyalgia    Fibromyalgia 06/28/2019   GERD (gastroesophageal reflux disease)    Headache    Insomnia    Insulin  dependent type 1 diabetes mellitus (HCC)    LGSIL of cervix of undetermined significance 10/2018   Colposcopy normal with ECC showing atypical fragments.  Recommend follow-up Pap smear/HPV at next annual exam.   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    Family History  Problem Relation Age of Onset  Diabetes Mother    Migraines Mother    Hypertension Mother    CVA Mother    Breast cancer Maternal Grandmother    Thyroid disease Maternal Grandmother    Cancer Maternal Grandfather     Social History   Socioeconomic History   Marital status: Single    Spouse name: Not on file   Number of children: Not on file   Years of education: Not on file   Highest education level: Not on file  Occupational History   Not on file  Tobacco Use   Smoking status: Never   Smokeless tobacco: Never  Vaping Use   Vaping status: Never Used  Substance and Sexual Activity   Alcohol use: No    Alcohol/week: 0.0 standard drinks of alcohol   Drug use: No   Sexual activity: Yes    Birth control/protection: Implant    Comment: 1ST INTERCOURSE- 67, PARTNERS - 6 . Nexplanon  06/27/2018  Other Topics Concern   Not on file  Social History Narrative   Student.  Not married.  Lives alone in a one story home.   Not working.   Social Drivers of Corporate Investment Banker Strain: Not on file  Food Insecurity: Not on file  Transportation Needs: Not  on file  Physical Activity: Not on file  Stress: Not on file  Social Connections: Not on file  Intimate Partner Violence: Not on file    Past Medical History, Surgical history, Social history, and Family history were reviewed and updated as appropriate.   Please see review of systems for further details on the patient's review from today.   Objective:   Physical Exam:  There were no vitals taken for this visit.  Physical Exam Constitutional:      General: She is not in acute distress. Musculoskeletal:        General: No deformity.  Neurological:     Mental Status: She is alert and oriented to person, place, and time.     Coordination: Coordination normal.  Psychiatric:        Attention and Perception: Attention and perception normal. She does not perceive auditory or visual hallucinations.        Mood and Affect: Mood normal. Mood is not anxious or depressed. Affect is not labile, blunt, angry or inappropriate.        Speech: Speech normal.        Behavior: Behavior normal.        Thought Content: Thought content normal. Thought content is not paranoid or delusional. Thought content does not include homicidal or suicidal ideation. Thought content does not include homicidal or suicidal plan.        Cognition and Memory: Cognition and memory normal.        Judgment: Judgment normal.     Comments: Insight intact     Lab Review:     Component Value Date/Time   NA 137 05/12/2020 1357   K 3.7 05/12/2020 1357   CL 106 05/12/2020 1357   CO2 22 05/12/2020 1357   GLUCOSE 121 (H) 05/12/2020 1357   BUN 7 05/12/2020 1357   CREATININE 0.78 05/12/2020 1357   CREATININE 0.55 12/25/2013 1455   CALCIUM 8.9 05/12/2020 1357   PROT 7.6 05/11/2020 1604   ALBUMIN 3.9 05/11/2020 1604   AST 14 (L) 05/11/2020 1604   ALT 13 05/11/2020 1604   ALKPHOS 87 05/11/2020 1604   BILITOT 1.3 (H) 05/11/2020 1604   GFRNONAA >60 05/12/2020 1357   GFRAA >  60 07/05/2019 1254       Component Value  Date/Time   WBC 9.0 05/11/2020 1604   RBC 3.86 (L) 05/11/2020 1604   HGB 12.6 05/11/2020 1753   HCT 37.0 05/11/2020 1753   PLT 457 (H) 05/11/2020 1604   MCV 95.6 05/11/2020 1604   MCV 97.7 (A) 05/24/2013 2021   MCH 30.6 05/11/2020 1604   MCHC 32.0 05/11/2020 1604   RDW 12.9 05/11/2020 1604   LYMPHSABS 3.4 10/26/2017 0607   MONOABS 0.9 10/26/2017 0607   EOSABS 0.1 10/26/2017 0607   BASOSABS 0.1 10/26/2017 0607    No results found for: POCLITH, LITHIUM   No results found for: PHENYTOIN, PHENOBARB, VALPROATE, CBMZ   .res Assessment: Plan:    Plan:  D/C Belsomra  10mg  at bedtime  D/C Vyvanse  40mg  daily for now  Add Hydroxyzine  50mg  at bedtime for sleep  Will increase Clonazepam  1mg  BID to TID as needed for the next month due to multiple stressors. She has not been able to sleep with recent loss of dog Alpine. She reports over taking the Clonazepam  to help manage anxiety and sleep - and has run out of medication early. She is to pick up weekly prescriptions for now.  Continue: Prozac  40mg  daily  Wellbutrin  XL 300mg  daily - denies seizure history.  Discussed Spravato   RTC 4 weeks  25 minutes spent dedicated to the care of this patient on the date of this encounter to include pre-visit review of records, ordering of medication, post visit documentation, and face-to-face time with the patient discussing panic attacks, anxiety, depression, ADHD, and insomnia. Discussed over taking Clonazepam  but also acknowledged her loss. She has ot over taken medications previously. Will only send in weekly scripts for now. Will add Hydroxyzine  50mg  for current sleep issues.    Discussed potential benefits, risks, and side effects of stimulants with patient to include increased heart rate, palpitations, insomnia, increased anxiety, increased irritability, or decreased appetite.  Instructed patient to contact office if experiencing any significant tolerability issues.   Discussed  potential benefits, risk, and side effects of benzodiazepines to include potential risk of tolerance and dependence, as well as possible drowsiness.  Advised patient not to drive if experiencing drowsiness and to take lowest possible effective dose to minimize risk of dependence and tolerance.  Diagnoses and all orders for this visit:  Major depressive disorder, recurrent episode, moderate (HCC)  Generalized anxiety disorder -     clonazePAM  (KLONOPIN ) 1 MG tablet; Take 1 tablet (1 mg total) by mouth 3 (three) times daily as needed.  Insomnia, unspecified type -     hydrOXYzine  (ATARAX ) 50 MG tablet; Take 1 tablet (50 mg total) by mouth at bedtime as needed.  Attention deficit hyperactivity disorder (ADHD), combined type  Panic attacks     Please see After Visit Summary for patient specific instructions.  Future Appointments  Date Time Provider Department Center  01/31/2024  4:00 PM Ibtisam Benge Nattalie, NP [facility] None    No orders of the defined types were placed in this encounter.     -------------------------------

## 2024-01-31 ENCOUNTER — Encounter: Payer: Self-pay | Admitting: Adult Health

## 2024-01-31 ENCOUNTER — Telehealth: Admitting: Adult Health

## 2024-01-31 DIAGNOSIS — G4700 Insomnia, unspecified: Secondary | ICD-10-CM

## 2024-01-31 DIAGNOSIS — F419 Anxiety disorder, unspecified: Secondary | ICD-10-CM | POA: Diagnosis not present

## 2024-01-31 DIAGNOSIS — F411 Generalized anxiety disorder: Secondary | ICD-10-CM

## 2024-01-31 DIAGNOSIS — F410 Panic disorder [episodic paroxysmal anxiety] without agoraphobia: Secondary | ICD-10-CM

## 2024-01-31 DIAGNOSIS — F908 Attention-deficit hyperactivity disorder, other type: Secondary | ICD-10-CM

## 2024-01-31 DIAGNOSIS — F32A Depression, unspecified: Secondary | ICD-10-CM

## 2024-01-31 DIAGNOSIS — F909 Attention-deficit hyperactivity disorder, unspecified type: Secondary | ICD-10-CM | POA: Diagnosis not present

## 2024-01-31 DIAGNOSIS — F331 Major depressive disorder, recurrent, moderate: Secondary | ICD-10-CM

## 2024-01-31 MED ORDER — CLONAZEPAM 1 MG PO TABS: 1.0000 mg | ORAL_TABLET | Freq: Two times a day (BID) | ORAL | 2 refills | Status: AC | PRN

## 2024-01-31 MED ORDER — FLUOXETINE HCL 40 MG PO CAPS: ORAL_CAPSULE | ORAL | 2 refills | Status: AC

## 2024-01-31 MED ORDER — HYDROXYZINE HCL 50 MG PO TABS: 50.0000 mg | ORAL_TABLET | Freq: Every evening | ORAL | 2 refills | Status: DC | PRN

## 2024-01-31 MED ORDER — BUPROPION HCL ER (XL) 300 MG PO TB24: 300.0000 mg | ORAL_TABLET | Freq: Every day | ORAL | 2 refills | Status: DC

## 2024-01-31 NOTE — Progress Notes (Signed)
 Theresa [name] [id number] Apr 03, 1990 33 y.o.  Virtual Visit via Video Note  I connected with pt @ on 01/31/24 at  4:00 PM EST by a video enabled telemedicine application and verified that I am speaking with the correct person using two identifiers.   I discussed the limitations of evaluation and management by telemedicine and the availability of in person appointments. The patient expressed understanding and agreed to proceed.  I discussed the assessment and treatment plan with the patient. The patient was provided an opportunity to ask questions and all were answered. The patient agreed with the plan and demonstrated an understanding of the instructions.   The patient was advised to call back or seek an in-person evaluation if the symptoms worsen or if the condition fails to improve as anticipated.  I provided 25 minutes of non-face-to-face time during this encounter.  The patient was located at home.  The provider was located at [facility].   Angeline LOISE Sayers, NP   Subjective:   Patient ID:  Theresa [name] is a 33 y.o. (DOB Jul 16, 1990) female.  Chief Complaint: No chief complaint on file.   HPI Theresa [name] presents for follow-up of panic attacks, anxiety, depression, ADHD and insomnia.   Describes mood today as not too good. Pleasant. Reports tearfulness. Mood symptoms - reports depression, anxiety and irritability - can go into shambles easily.Reports she is working through the loss of her dog [place]. Reports the holidays are generally difficult for her. Reports lower interest and motivation. Reports ongoing situational stressors. Reports feeling panicked heightened anxiety. Reports worry, rumination and over thinking. Denies obsessive thoughts and acts. Reports chronic health issues. Working with pain management. Reports mood is lower - no change Stating I don't feel like I'm doing that well. Taking medications as prescribed.  Reports low energy  levels - improved after B12 injection. Active, does not have a regular exercise routine.   Reports she is unable to enjoy usual interests and activities. Single. Lives with aunt. Family local.   Appetite fluctuates. Weight gain - 220 pounds. Reports sleeping difficulties - averages 3 to 4 hours. Denies napping. Reports focus and concentration difficulties without Vyvanse . Completing tasks. Managing some aspects of household.  Denies SI or HI.  Denies AH or VH. Denies self harm. Denies substance use.  Previous medication trials: Hydroxyzine , Seroquel , Ambien  10mg , Lunesta, Elavil , Cymbalta, gabapentin  caused swelling in her lower extremities, Horizant, Topamax, baclofen, Lunesta, Belsomra  was effective, trazodone  caused increased glucose sonar caused increased glucose, Modafinil  did not help, Wellbutrin .    Review of Systems:  Review of Systems  Musculoskeletal:  Negative for gait problem.  Neurological:  Negative for tremors.  Psychiatric/Behavioral:         Please refer to HPI    Medications: I have reviewed the patient's current medications.  Current Outpatient Medications  Medication Sig Dispense Refill   B-COMPLEX-C PO Take 1 capsule by mouth daily.      buPROPion  (WELLBUTRIN  XL) 300 MG 24 hr tablet Take 1 tablet (300 mg total) by mouth daily. 30 tablet 2   cholecalciferol (VITAMIN D3) 25 MCG (1000 UNIT) tablet Take 1,000 Units by mouth daily.     clonazePAM  (KLONOPIN ) 1 MG tablet Take 1 tablet (1 mg total) by mouth 3 (three) times daily as needed. 21 tablet 0   CONTOUR NEXT TEST test strip 1 each by Other route See admin instructions. 7 times daily     dexlansoprazole (DEXILANT) 60 MG capsule Take 1 capsule by mouth daily.  diphenoxylate-atropine  (LOMOTIL) 2.5-0.025 MG tablet Take 1 tablet by mouth 4 (four) times daily as needed.     etonogestrel  (NEXPLANON ) 68 MG IMPL implant 1 each by Subdermal route continuous.      famotidine  (PEPCID ) 40 MG tablet Take 1 tablet by mouth  daily.     fluconazole  (DIFLUCAN ) 200 MG tablet Take 200 mg by mouth every other day. (Patient not taking: Reported on 05/11/2020)     FLUoxetine  (PROZAC ) 40 MG capsule TAKE 1 CAPSULE BY MOUTH DAILY 30 capsule 2   hydrOXYzine  (ATARAX ) 50 MG tablet Take 1 tablet (50 mg total) by mouth at bedtime as needed. 30 tablet 0   hydrOXYzine  (ATARAX /VISTARIL ) 10 MG tablet Take 10 mg by mouth every 8 (eight) hours as needed for itching.     insulin  aspart (NOVOLOG ) 100 UNIT/ML FlexPen Inject 5-20 Units into the skin See admin instructions. Sliding scale: If blood sugar over 150, takes 5 units. Maximum 20 units.     insulin  degludec (TRESIBA ) 100 UNIT/ML FlexTouch Pen Inject 50 Units into the skin daily.     lisdexamfetamine (VYVANSE ) 40 MG capsule Take 1 capsule (40 mg total) by mouth daily. 30 capsule 0   lisdexamfetamine (VYVANSE ) 40 MG capsule Take 1 capsule (40 mg total) by mouth every morning. 30 capsule 0   lisdexamfetamine (VYVANSE ) 40 MG capsule Take 1 capsule (40 mg total) by mouth every morning. 30 capsule 0   NARCAN 4 MG/0.1ML LIQD nasal spray kit Place 1 spray into the nose once. Overdose     ondansetron  (ZOFRAN -ODT) 8 MG disintegrating tablet Take 8 mg by mouth 3 (three) times daily as needed.     OVER THE COUNTER MEDICATION Take 1 Dose by mouth daily as needed (nausea/vomiting).     Promethazine  HCl 6.25 MG/5ML SOLN Take 5 mLs by mouth every 8 (eight) hours as needed (cough).     sucralfate  (CARAFATE ) 1 g tablet Take 1 g by mouth 4 (four) times daily.     Suvorexant  (BELSOMRA ) 10 MG TABS Take 1 tablet (10 mg total) by mouth at bedtime. 30 tablet 0   Turmeric 500 MG CAPS Take 500 mg by mouth daily.      No current facility-administered medications for this visit.    Medication Side Effects: None  Allergies:  Allergies  Allergen Reactions   Tapentadol Swelling and Rash   Aloe Hives   Duloxetine Other (See Comments)    RESTLESSNESS, URGES TO MOVE   Gabapentin  Swelling    SWELLING REACTION  OF FEET    Olive Oil Hives   Pregabalin Other (See Comments)    Heart palpitations, Fainting, chest pain, sweats   Metoclopramide  Other (See Comments)   Zonisamide Other (See Comments)    WORSENING PAIN    Past Medical History:  Diagnosis Date   Fibromyalgia    Fibromyalgia 06/28/2019   GERD (gastroesophageal reflux disease)    Headache    Insomnia    Insulin  dependent type 1 diabetes mellitus (HCC)    LGSIL of cervix of undetermined significance 10/2018   Colposcopy normal with ECC showing atypical fragments.  Recommend follow-up Pap smear/HPV at next annual exam.   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    Family History  Problem Relation Age of Onset   Diabetes Mother    Migraines Mother    Hypertension Mother    CVA Mother  Breast cancer Maternal Grandmother    Thyroid disease Maternal Grandmother    Cancer Maternal Grandfather     Social History   Socioeconomic History   Marital status: Single    Spouse name: Not on file   Number of children: Not on file   Years of education: Not on file   Highest education level: Not on file  Occupational History   Not on file  Tobacco Use   Smoking status: Never   Smokeless tobacco: Never  Vaping Use   Vaping status: Never Used  Substance and Sexual Activity   Alcohol use: No    Alcohol/week: 0.0 standard drinks of alcohol   Drug use: No   Sexual activity: Yes    Birth control/protection: Implant    Comment: 1ST INTERCOURSE- 43, PARTNERS - 6 . Nexplanon  06/27/2018  Other Topics Concern   Not on file  Social History Narrative   Student.  Not married.  Lives alone in a one story home.   Not working.   Social Drivers of Corporate Investment Banker Strain: Not on file  Food Insecurity: Not on file  Transportation Needs: Not on file  Physical Activity: Not on file  Stress: Not on file  Social Connections: Not on  file  Intimate Partner Violence: Not on file    Past Medical History, Surgical history, Social history, and Family history were reviewed and updated as appropriate.   Please see review of systems for further details on the patient's review from today.   Objective:   Physical Exam:  There were no vitals taken for this visit.  Physical Exam Constitutional:      General: She is not in acute distress. Musculoskeletal:        General: No deformity.  Neurological:     Mental Status: She is alert and oriented to person, place, and time.     Coordination: Coordination normal.  Psychiatric:        Attention and Perception: Attention and perception normal. She does not perceive auditory or visual hallucinations.        Mood and Affect: Mood normal. Mood is not anxious or depressed. Affect is not labile, blunt, angry or inappropriate.        Speech: Speech normal.        Behavior: Behavior normal.        Thought Content: Thought content normal. Thought content is not paranoid or delusional. Thought content does not include homicidal or suicidal ideation. Thought content does not include homicidal or suicidal plan.        Cognition and Memory: Cognition and memory normal.        Judgment: Judgment normal.     Comments: Insight intact     Lab Review:     Component Value Date/Time   NA 137 05/12/2020 1357   K 3.7 05/12/2020 1357   CL 106 05/12/2020 1357   CO2 22 05/12/2020 1357   GLUCOSE 121 (H) 05/12/2020 1357   BUN 7 05/12/2020 1357   CREATININE 0.78 05/12/2020 1357   CREATININE 0.55 12/25/2013 1455   CALCIUM 8.9 05/12/2020 1357   PROT 7.6 05/11/2020 1604   ALBUMIN 3.9 05/11/2020 1604   AST 14 (L) 05/11/2020 1604   ALT 13 05/11/2020 1604   ALKPHOS 87 05/11/2020 1604   BILITOT 1.3 (H) 05/11/2020 1604   GFRNONAA >60 05/12/2020 1357   GFRAA >60 07/05/2019 1254       Component Value Date/Time   WBC 9.0 05/11/2020 1604  RBC 3.86 (L) 05/11/2020 1604   HGB 12.6 05/11/2020  1753   HCT 37.0 05/11/2020 1753   PLT 457 (H) 05/11/2020 1604   MCV 95.6 05/11/2020 1604   MCV 97.7 (A) 05/24/2013 2021   MCH 30.6 05/11/2020 1604   MCHC 32.0 05/11/2020 1604   RDW 12.9 05/11/2020 1604   LYMPHSABS 3.4 10/26/2017 0607   MONOABS 0.9 10/26/2017 0607   EOSABS 0.1 10/26/2017 0607   BASOSABS 0.1 10/26/2017 0607    No results found for: POCLITH, LITHIUM   No results found for: PHENYTOIN, PHENOBARB, VALPROATE, CBMZ   .res Assessment: Plan:    Plan:  Continue: Prozac  40mg  daily  Wellbutrin  XL 300mg  daily - denies seizure history. Hydroxyzine  50mg  at bedtime for sleep  Plan: Restart Clonazepam  1mg  BID  Restart Vyvanse  40mg  daily  Discussed Spravato   RTC 3 weeks  25 minutes spent dedicated to the care of this patient on the date of this encounter to include pre-visit review of records, ordering of medication, post visit documentation, and face-to-face time with the patient discussing panic attacks, anxiety, depression, ADHD, and insomnia.   Discussed potential benefits, risks, and side effects of stimulants with patient to include increased heart rate, palpitations, insomnia, increased anxiety, increased irritability, or decreased appetite.  Instructed patient to contact office if experiencing any significant tolerability issues.   Discussed potential benefits, risk, and side effects of benzodiazepines to include potential risk of tolerance and dependence, as well as possible drowsiness. Advised patient not to drive if experiencing drowsiness and to take lowest possible effective dose to minimize risk of dependence and tolerance.  There are no diagnoses linked to this encounter.   Please see After Visit Summary for patient specific instructions.  Future Appointments  Date Time Provider Department Center  01/31/2024  4:00 PM Julicia Krieger Nattalie, NP [facility] None    No orders of the defined types were placed in this encounter.      -------------------------------

## 2024-02-24 ENCOUNTER — Encounter: Payer: Self-pay | Admitting: Adult Health

## 2024-02-24 ENCOUNTER — Ambulatory Visit: Admitting: Adult Health

## 2024-02-24 DIAGNOSIS — F902 Attention-deficit hyperactivity disorder, combined type: Secondary | ICD-10-CM | POA: Diagnosis not present

## 2024-02-24 DIAGNOSIS — G4700 Insomnia, unspecified: Secondary | ICD-10-CM | POA: Diagnosis not present

## 2024-02-24 DIAGNOSIS — F331 Major depressive disorder, recurrent, moderate: Secondary | ICD-10-CM | POA: Diagnosis not present

## 2024-02-24 DIAGNOSIS — F411 Generalized anxiety disorder: Secondary | ICD-10-CM

## 2024-02-24 MED ORDER — LISDEXAMFETAMINE DIMESYLATE 40 MG PO CAPS: 40.0000 mg | ORAL_CAPSULE | Freq: Every day | ORAL | 0 refills | Status: AC

## 2024-02-24 MED ORDER — CLONAZEPAM 1 MG PO TABS: 1.0000 mg | ORAL_TABLET | Freq: Three times a day (TID) | ORAL | 1 refills | Status: DC | PRN

## 2024-02-24 NOTE — Progress Notes (Signed)
 Mellisa Arshad [id number] [birth year] 33 y.o.  Subjective:   Patient ID:  Theresa [name] is a 33 y.o. (DOB 04/29/1990) female.  Chief Complaint: No chief complaint on file.   HPI Theresa [name] presents to the office today for follow-up of panic attacks, anxiety, depression, ADHD and insomnia.   Describes mood today as about the same. Pleasant. Reports tearfulness. Mood symptoms - reports depression, anxiety and irritability. Reports feeling more anxious overall. Reports she continues to work through the loss of her dog [place]. Reports the holidays are generally difficult for her. Reports lower interest and motivation - taking vitamin D and monthly Vitamin B injections. Denies panic attacks. Reports worry, rumination and over thinking. Denies obsessive thoughts and acts. Reports chronic health issues. Working with pain management. Reports mood is lower. Stating I feel about the same. Taking medications as prescribed.  Reports low energy levels. Active, does not have a regular exercise routine.   Reports she is unable to enjoy usual interests and activities. Single. Lives with aunt. Family local.   Appetite fluctuates. Weight gain - 220 pounds. Reports sleeping difficulties - loss and season. Reports some daytime napping. Reports focus and concentration improved with Vyvanse . Completing tasks. Managing some aspects of household.  Denies SI or HI.  Denies AH or VH. Denies self harm. Denies substance use.  Previous medication trials: Hydroxyzine , Seroquel , Ambien  10mg , Lunesta, Elavil , Cymbalta, gabapentin  caused swelling in her lower extremities, Horizant, Topamax, baclofen, Lunesta, Belsomra  was effective, trazodone  caused increased glucose sonar caused increased glucose, Modafinil  did not help, Wellbutrin .    PHQ2-9    Flowsheet Row Nutrition from 11/24/2016 in [facility]  - A Dept Of [place]. [facility] Nutrition from 08/24/2016 in [facility]  Nutr Diab Ed  - A Dept Of [place]. [facility] Nutrition from 07/20/2016 in [facility]r Diab [facility]  - A Dept Of [place]. [facility] Nutrition from 01/15/2014 in [facility] Nutr Diab Ed  - A Dept Of [place]. [facility]  PHQ-2 Total Score 0 0 0 0   Flowsheet Row ED to Hosp-Admission (Discharged) from 05/11/2020 in [facility] Most recent reading at 05/11/2020  3:54 PM ED to Hosp-Admission (Discharged) from 04/07/2020 in [facility] Most recent reading at 04/07/2020  4:56 PM UC from 04/07/2020 in [facility] Most recent reading at 04/07/2020  4:11 PM  C-SSRS RISK CATEGORY No Risk No Risk No Risk     Review of Systems:  Review of Systems  Musculoskeletal:  Negative for gait problem.  Neurological:  Negative for tremors.  Psychiatric/Behavioral:         Please refer to HPI    Medications: I have reviewed the patient's current medications.  Current Outpatient Medications  Medication Sig Dispense Refill   B-COMPLEX-C PO Take 1 capsule by mouth daily.      buPROPion  (WELLBUTRIN  XL) 300 MG 24 hr tablet Take 1 tablet (300 mg total) by mouth daily. 30 tablet 2   cholecalciferol (VITAMIN D3) 25 MCG (1000 UNIT) tablet Take 1,000 Units by mouth daily.     clonazePAM  (KLONOPIN ) 1 MG tablet Take 1 tablet (1 mg total) by mouth 2 (two) times daily as needed. 60 tablet 2   CONTOUR NEXT TEST test strip 1 each by Other route See admin instructions. 7 times daily     dexlansoprazole (DEXILANT) 60 MG capsule Take  1 capsule by mouth daily.     diphenoxylate-atropine  (LOMOTIL) 2.5-0.025 MG tablet Take 1 tablet by mouth 4 (four) times daily as needed.     etonogestrel  (NEXPLANON ) 68 MG IMPL implant 1 each by Subdermal route continuous.      famotidine  (PEPCID ) 40 MG tablet Take 1 tablet by mouth daily.     fluconazole  (DIFLUCAN ) 200 MG tablet Take 200 mg by mouth every other day. (Patient not taking:  Reported on 05/11/2020)     FLUoxetine  (PROZAC ) 40 MG capsule TAKE 1 CAPSULE BY MOUTH DAILY 30 capsule 2   hydrOXYzine  (ATARAX ) 50 MG tablet Take 1 tablet (50 mg total) by mouth at bedtime as needed. 30 tablet 2   hydrOXYzine  (ATARAX /VISTARIL ) 10 MG tablet Take 10 mg by mouth every 8 (eight) hours as needed for itching.     insulin  aspart (NOVOLOG ) 100 UNIT/ML FlexPen Inject 5-20 Units into the skin See admin instructions. Sliding scale: If blood sugar over 150, takes 5 units. Maximum 20 units.     insulin  degludec (TRESIBA ) 100 UNIT/ML FlexTouch Pen Inject 50 Units into the skin daily.     lisdexamfetamine  (VYVANSE ) 40 MG capsule Take 1 capsule (40 mg total) by mouth daily. 30 capsule 0   lisdexamfetamine  (VYVANSE ) 40 MG capsule Take 1 capsule (40 mg total) by mouth every morning. 30 capsule 0   lisdexamfetamine  (VYVANSE ) 40 MG capsule Take 1 capsule (40 mg total) by mouth every morning. 30 capsule 0   NARCAN 4 MG/0.1ML LIQD nasal spray kit Place 1 spray into the nose once. Overdose     ondansetron  (ZOFRAN -ODT) 8 MG disintegrating tablet Take 8 mg by mouth 3 (three) times daily as needed.     OVER THE COUNTER MEDICATION Take 1 Dose by mouth daily as needed (nausea/vomiting).     Promethazine  HCl 6.25 MG/5ML SOLN Take 5 mLs by mouth every 8 (eight) hours as needed (cough).     sucralfate  (CARAFATE ) 1 g tablet Take 1 g by mouth 4 (four) times daily.     Suvorexant  (BELSOMRA ) 10 MG TABS Take 1 tablet (10 mg total) by mouth at bedtime. 30 tablet 0   Turmeric 500 MG CAPS Take 500 mg by mouth daily.      No current facility-administered medications for this visit.    Medication Side Effects: None  Allergies: Allergies[1]  Past Medical History:  Diagnosis Date   Fibromyalgia    Fibromyalgia 06/28/2019   GERD (gastroesophageal reflux disease)    Headache    Insomnia    Insulin  dependent type 1 diabetes mellitus (HCC)    LGSIL of cervix of undetermined significance 10/2018   Colposcopy normal  with ECC showing atypical fragments.  Recommend follow-up Pap smear/HPV at next annual exam.   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    Past Medical History, Surgical history, Social history, and Family history were reviewed and updated as appropriate.   Please see review of systems for further details on the patient's review from today.   Objective:   Physical Exam:  There were no vitals taken for this visit.  Physical Exam Constitutional:      General: She is not in acute distress. Musculoskeletal:        General: No deformity.  Neurological:     Mental Status: She is alert and oriented to person, place, and time.     Coordination: Coordination  normal.  Psychiatric:        Attention and Perception: Attention and perception normal. She does not perceive auditory or visual hallucinations.        Mood and Affect: Mood normal. Mood is not anxious or depressed. Affect is not labile, blunt, angry or inappropriate.        Speech: Speech normal.        Behavior: Behavior normal.        Thought Content: Thought content normal. Thought content is not paranoid or delusional. Thought content does not include homicidal or suicidal ideation. Thought content does not include homicidal or suicidal plan.        Cognition and Memory: Cognition and memory normal.        Judgment: Judgment normal.     Comments: Insight intact     Lab Review:     Component Value Date/Time   NA 137 05/12/2020 1357   K 3.7 05/12/2020 1357   CL 106 05/12/2020 1357   CO2 22 05/12/2020 1357   GLUCOSE 121 (H) 05/12/2020 1357   BUN 7 05/12/2020 1357   CREATININE 0.78 05/12/2020 1357   CREATININE 0.55 12/25/2013 1455   CALCIUM 8.9 05/12/2020 1357   PROT 7.6 05/11/2020 1604   ALBUMIN 3.9 05/11/2020 1604   AST 14 (L) 05/11/2020 1604   ALT 13 05/11/2020 1604   ALKPHOS 87 05/11/2020 1604   BILITOT 1.3 (H)  05/11/2020 1604   GFRNONAA >60 05/12/2020 1357   GFRAA >60 07/05/2019 1254       Component Value Date/Time   WBC 9.0 05/11/2020 1604   RBC 3.86 (L) 05/11/2020 1604   HGB 12.6 05/11/2020 1753   HCT 37.0 05/11/2020 1753   PLT 457 (H) 05/11/2020 1604   MCV 95.6 05/11/2020 1604   MCV 97.7 (A) 05/24/2013 2021   MCH 30.6 05/11/2020 1604   MCHC 32.0 05/11/2020 1604   RDW 12.9 05/11/2020 1604   LYMPHSABS 3.4 10/26/2017 0607   MONOABS 0.9 10/26/2017 0607   EOSABS 0.1 10/26/2017 0607   BASOSABS 0.1 10/26/2017 0607    No results found for: POCLITH, LITHIUM   No results found for: PHENYTOIN, PHENOBARB, VALPROATE, CBMZ   .res Assessment: Plan:    Plan:  Continue: Prozac  40mg  daily  Wellbutrin  XL 300mg  daily - denies seizure history. Hydroxyzine  50mg  at bedtime for sleep Vyvanse  40mg  daily  Plan: Clonazepam  1mg  BID to TID - weekly scripts   Discussed Spravato   RTC 3 weeks  25 minutes spent dedicated to the care of this patient on the date of this encounter to include pre-visit review of records, ordering of medication, post visit documentation, and face-to-face time with the patient discussing panic attacks, anxiety, depression, ADHD, and insomnia.   Discussed potential benefits, risks, and side effects of stimulants with patient to include increased heart rate, palpitations, insomnia, increased anxiety, increased irritability, or decreased appetite.  Instructed patient to contact office if experiencing any significant tolerability issues.   Discussed potential benefits, risk, and side effects of benzodiazepines to include potential risk of tolerance and dependence, as well as possible drowsiness. Advised patient not to drive if experiencing drowsiness and to take lowest possible effective dose to minimize risk of dependence and tolerance. There are no diagnoses linked to this encounter.   Please see After Visit Summary for patient specific instructions.  Future  Appointments  Date Time Provider Department Center  02/24/2024  4:00 PM Dudley Mages Nattalie, NP CP-CP None    No orders of the defined  types were placed in this encounter.   -------------------------------     [1]  Allergies Allergen Reactions   Tapentadol Swelling and Rash   Aloe Hives   Duloxetine Other (See Comments)    RESTLESSNESS, URGES TO MOVE   Gabapentin  Swelling    SWELLING REACTION OF FEET    Olive Oil Hives   Pregabalin Other (See Comments)    Heart palpitations, Fainting, chest pain, sweats   Metoclopramide  Other (See Comments)   Zonisamide Other (See Comments)    WORSENING PAIN

## 2024-03-16 ENCOUNTER — Telehealth: Admitting: Adult Health

## 2024-03-16 ENCOUNTER — Encounter: Payer: Self-pay | Admitting: Adult Health

## 2024-03-16 DIAGNOSIS — G4700 Insomnia, unspecified: Secondary | ICD-10-CM | POA: Diagnosis not present

## 2024-03-16 DIAGNOSIS — F331 Major depressive disorder, recurrent, moderate: Secondary | ICD-10-CM

## 2024-03-16 DIAGNOSIS — F902 Attention-deficit hyperactivity disorder, combined type: Secondary | ICD-10-CM | POA: Diagnosis not present

## 2024-03-16 DIAGNOSIS — F909 Attention-deficit hyperactivity disorder, unspecified type: Secondary | ICD-10-CM

## 2024-03-16 DIAGNOSIS — F908 Attention-deficit hyperactivity disorder, other type: Secondary | ICD-10-CM

## 2024-03-16 DIAGNOSIS — F411 Generalized anxiety disorder: Secondary | ICD-10-CM

## 2024-03-16 DIAGNOSIS — F32A Depression, unspecified: Secondary | ICD-10-CM

## 2024-03-16 DIAGNOSIS — F419 Anxiety disorder, unspecified: Secondary | ICD-10-CM | POA: Diagnosis not present

## 2024-03-16 MED ORDER — HYDROXYZINE HCL 50 MG PO TABS: 50.0000 mg | ORAL_TABLET | Freq: Every evening | ORAL | 2 refills | Status: AC | PRN

## 2024-03-16 MED ORDER — CLONAZEPAM 1 MG PO TABS: 1.0000 mg | ORAL_TABLET | Freq: Three times a day (TID) | ORAL | 3 refills | Status: AC | PRN

## 2024-03-16 MED ORDER — BUPROPION HCL ER (XL) 150 MG PO TB24: 300.0000 mg | ORAL_TABLET | Freq: Every day | ORAL | 2 refills | Status: DC

## 2024-03-16 MED ORDER — BUPROPION HCL ER (XL) 150 MG PO TB24: 150.0000 mg | ORAL_TABLET | Freq: Every day | ORAL | 2 refills | Status: AC

## 2024-03-16 MED ORDER — LISDEXAMFETAMINE DIMESYLATE 40 MG PO CAPS: 40.0000 mg | ORAL_CAPSULE | ORAL | 0 refills | Status: AC

## 2024-03-16 NOTE — Progress Notes (Signed)
 Desma Wilkowski [id number] Jan 03, 1991 34 y.o.  Virtual Visit via Telephone Note  I connected with pt on 03/16/2024 at  4:30 PM EST by telephone and verified that I am speaking with the correct person using two identifiers.   I discussed the limitations, risks, security and privacy concerns of performing an evaluation and management service by telephone and the availability of in person appointments. I also discussed with the patient that there may be a patient responsible charge related to this service. The patient expressed understanding and agreed to proceed.   I discussed the assessment and treatment plan with the patient. The patient was provided an opportunity to ask questions and all were answered. The patient agreed with the plan and demonstrated an understanding of the instructions.   The patient was advised to call back or seek an in-person evaluation if the symptoms worsen or if the condition fails to improve as anticipated.  I provided 25 minutes of non-face-to-face time during this encounter.  The patient was located at home.  The provider was located at [facility].   Angeline LOISE Sayers, NP   Subjective:   Patient ID:  Rudolph Dobler is a 34 y.o. (DOB 07/30/1990) female.  Chief Complaint: No chief complaint on file.   HPI Leland Staszewski presents for follow-up of panic attacks, anxiety, depression, ADHD and insomnia.   Describes mood today as about the same. Pleasant. Reports tearfulness. Mood symptoms - reports depression, anxiety and irritability. Reports feeling more anxious overall. Reports she continues to work through the loss of her dog Sachse. Reports the holidays are generally difficult for her. Reports lower interest and motivation - taking vitamin D and monthly Vitamin B injections. Denies panic attacks. Reports worry, rumination and over thinking. Denies obsessive thoughts and acts. Reports chronic health issues. Working with pain management.  Reports mood is lower. Stating I feel about the same. Taking medications as prescribed.  Reports low energy levels. Active, does not have a regular exercise routine.   Reports she is unable to enjoy usual interests and activities. Single. Lives with aunt. Family local.   Appetite fluctuates. Weight gain - 220 pounds. Reports sleeping difficulties - loss and season. Reports some daytime napping. Reports focus and concentration improved with Vyvanse . Completing tasks. Managing some aspects of household.  Denies SI or HI.  Denies AH or VH. Denies self harm. Denies substance use.  Previous medication trials: Hydroxyzine , Seroquel , Ambien  10mg , Lunesta, Elavil , Cymbalta, gabapentin  caused swelling in her lower extremities, Horizant, Topamax, baclofen, Lunesta, Belsomra  was effective, trazodone  caused increased glucose sonar caused increased glucose, Modafinil  did not help, Wellbutrin .  Review of Systems:  Review of Systems  Musculoskeletal:  Negative for gait problem.  Neurological:  Negative for tremors.  Psychiatric/Behavioral:         Please refer to HPI    Medications: I have reviewed the patient's current medications.  Current Outpatient Medications  Medication Sig Dispense Refill   B-COMPLEX-C PO Take 1 capsule by mouth daily.      buPROPion  (WELLBUTRIN  XL) 300 MG 24 hr tablet Take 1 tablet (300 mg total) by mouth daily. 30 tablet 2   cholecalciferol (VITAMIN D3) 25 MCG (1000 UNIT) tablet Take 1,000 Units by mouth daily.     clonazePAM  (KLONOPIN ) 1 MG tablet Take 1 tablet (1 mg total) by mouth 3 (three) times daily as needed. 21 tablet 1   CONTOUR NEXT TEST test strip 1 each by Other route See admin instructions. 7 times daily  dexlansoprazole (DEXILANT) 60 MG capsule Take 1 capsule by mouth daily.     diphenoxylate-atropine  (LOMOTIL) 2.5-0.025 MG tablet Take 1 tablet by mouth 4 (four) times daily as needed.     etonogestrel  (NEXPLANON ) 68 MG IMPL implant 1 each by Subdermal  route continuous.      famotidine  (PEPCID ) 40 MG tablet Take 1 tablet by mouth daily.     fluconazole  (DIFLUCAN ) 200 MG tablet Take 200 mg by mouth every other day. (Patient not taking: Reported on 05/11/2020)     FLUoxetine  (PROZAC ) 40 MG capsule TAKE 1 CAPSULE BY MOUTH DAILY 30 capsule 2   hydrOXYzine  (ATARAX ) 50 MG tablet Take 1 tablet (50 mg total) by mouth at bedtime as needed. 30 tablet 2   hydrOXYzine  (ATARAX /VISTARIL ) 10 MG tablet Take 10 mg by mouth every 8 (eight) hours as needed for itching.     insulin  aspart (NOVOLOG ) 100 UNIT/ML FlexPen Inject 5-20 Units into the skin See admin instructions. Sliding scale: If blood sugar over 150, takes 5 units. Maximum 20 units.     insulin  degludec (TRESIBA ) 100 UNIT/ML FlexTouch Pen Inject 50 Units into the skin daily.     lisdexamfetamine  (VYVANSE ) 40 MG capsule Take 1 capsule (40 mg total) by mouth every morning. 30 capsule 0   lisdexamfetamine  (VYVANSE ) 40 MG capsule Take 1 capsule (40 mg total) by mouth every morning. 30 capsule 0   lisdexamfetamine  (VYVANSE ) 40 MG capsule Take 1 capsule (40 mg total) by mouth daily. 30 capsule 0   NARCAN 4 MG/0.1ML LIQD nasal spray kit Place 1 spray into the nose once. Overdose     ondansetron  (ZOFRAN -ODT) 8 MG disintegrating tablet Take 8 mg by mouth 3 (three) times daily as needed.     OVER THE COUNTER MEDICATION Take 1 Dose by mouth daily as needed (nausea/vomiting).     Promethazine  HCl 6.25 MG/5ML SOLN Take 5 mLs by mouth every 8 (eight) hours as needed (cough).     sucralfate  (CARAFATE ) 1 g tablet Take 1 g by mouth 4 (four) times daily.     Suvorexant  (BELSOMRA ) 10 MG TABS Take 1 tablet (10 mg total) by mouth at bedtime. 30 tablet 0   Turmeric 500 MG CAPS Take 500 mg by mouth daily.      No current facility-administered medications for this visit.    Medication Side Effects: None  Allergies: Allergies[1]  Past Medical History:  Diagnosis Date   Fibromyalgia    Fibromyalgia 06/28/2019   GERD  (gastroesophageal reflux disease)    Headache    Insomnia    Insulin  dependent type 1 diabetes mellitus (HCC)    LGSIL of cervix of undetermined significance 10/2018   Colposcopy normal with ECC showing atypical fragments.  Recommend follow-up Pap smear/HPV at next annual exam.   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    Family History  Problem Relation Age of Onset   Diabetes Mother    Migraines Mother    Hypertension Mother    CVA Mother    Breast cancer Maternal Grandmother    Thyroid disease Maternal Grandmother    Cancer Maternal Grandfather     Social History   Socioeconomic History   Marital status: Single    Spouse name: Not on file   Number of children: Not on file   Years of education: Not on file   Highest education level: Not on  file  Occupational History   Not on file  Tobacco Use   Smoking status: Never   Smokeless tobacco: Never  Vaping Use   Vaping status: Never Used  Substance and Sexual Activity   Alcohol use: No    Alcohol/week: 0.0 standard drinks of alcohol   Drug use: No   Sexual activity: Yes    Birth control/protection: Implant    Comment: 1ST INTERCOURSE- 27, PARTNERS - 6 . Nexplanon  06/27/2018  Other Topics Concern   Not on file  Social History Narrative   Student.  Not married.  Lives alone in a one story home.   Not working.   Social Drivers of Health   Tobacco Use: Low Risk (02/24/2024)   Patient History    Smoking Tobacco Use: Never    Smokeless Tobacco Use: Never    Passive Exposure: Not on file  Financial Resource Strain: Not on file  Food Insecurity: Not on file  Transportation Needs: Not on file  Physical Activity: Not on file  Stress: Not on file  Social Connections: Not on file  Intimate Partner Violence: Not on file  Depression (EYV7-0): Not on file  Alcohol Screen: Not on file  Housing: Not on file  Utilities: Not  on file  Health Literacy: Not on file    Past Medical History, Surgical history, Social history, and Family history were reviewed and updated as appropriate.   Please see review of systems for further details on the patient's review from today.   Objective:   Physical Exam:  There were no vitals taken for this visit.  Physical Exam Constitutional:      General: She is not in acute distress. Musculoskeletal:        General: No deformity.  Neurological:     Mental Status: She is alert and oriented to person, place, and time.     Coordination: Coordination normal.  Psychiatric:        Attention and Perception: Attention and perception normal. She does not perceive auditory or visual hallucinations.        Mood and Affect: Mood normal. Mood is not anxious or depressed. Affect is not labile, blunt, angry or inappropriate.        Speech: Speech normal.        Behavior: Behavior normal.        Thought Content: Thought content normal. Thought content is not paranoid or delusional. Thought content does not include homicidal or suicidal ideation. Thought content does not include homicidal or suicidal plan.        Cognition and Memory: Cognition and memory normal.        Judgment: Judgment normal.     Comments: Insight intact     Lab Review:     Component Value Date/Time   NA 137 05/12/2020 1357   K 3.7 05/12/2020 1357   CL 106 05/12/2020 1357   CO2 22 05/12/2020 1357   GLUCOSE 121 (H) 05/12/2020 1357   BUN 7 05/12/2020 1357   CREATININE 0.78 05/12/2020 1357   CREATININE 0.55 12/25/2013 1455   CALCIUM 8.9 05/12/2020 1357   PROT 7.6 05/11/2020 1604   ALBUMIN 3.9 05/11/2020 1604   AST 14 (L) 05/11/2020 1604   ALT 13 05/11/2020 1604   ALKPHOS 87 05/11/2020 1604   BILITOT 1.3 (H) 05/11/2020 1604   GFRNONAA >60 05/12/2020 1357   GFRAA >60 07/05/2019 1254       Component Value Date/Time   WBC 9.0 05/11/2020 1604   RBC 3.86 (L)  05/11/2020 1604   HGB 12.6 05/11/2020 1753    HCT 37.0 05/11/2020 1753   PLT 457 (H) 05/11/2020 1604   MCV 95.6 05/11/2020 1604   MCV 97.7 (A) 05/24/2013 2021   MCH 30.6 05/11/2020 1604   MCHC 32.0 05/11/2020 1604   RDW 12.9 05/11/2020 1604   LYMPHSABS 3.4 10/26/2017 0607   MONOABS 0.9 10/26/2017 0607   EOSABS 0.1 10/26/2017 0607   BASOSABS 0.1 10/26/2017 0607    No results found for: POCLITH, LITHIUM   No results found for: PHENYTOIN, PHENOBARB, VALPROATE, CBMZ   .res Assessment: Plan:    Plan:  Continue: Prozac  40mg  daily  Wellbutrin  XL 300mg  daily - denies seizure history. Vyvanse  40mg  daily Hydroxyzine  50mg  at bedtime for sleep  Plan: Clonazepam  1mg  TID - will send weekly scripts   Discussed Spravato   RTC 3 weeks  25 minutes spent dedicated to the care of this patient on the date of this encounter to include pre-visit review of records, ordering of medication, post visit documentation, and face-to-face time with the patient discussing panic attacks, anxiety, depression, ADHD, and insomnia.   Discussed potential benefits, risks, and side effects of stimulants with patient to include increased heart rate, palpitations, insomnia, increased anxiety, increased irritability, or decreased appetite.  Instructed patient to contact office if experiencing any significant tolerability issues.   Discussed potential benefits, risk, and side effects of benzodiazepines to include potential risk of tolerance and dependence, as well as possible drowsiness. Advised patient not to drive if experiencing drowsiness and to take lowest possible effective dose to minimize risk of dependence and tolerance.  There are no diagnoses linked to this encounter.  Please see After Visit Summary for patient specific instructions.  Future Appointments  Date Time Provider Department Center  03/16/2024  4:30 PM Tona Qualley Nattalie, NP [facility] None    No orders of the defined types were placed in this encounter.      -------------------------------     [1]  Allergies Allergen Reactions   Tapentadol Swelling and Rash   Aloe Hives   Duloxetine Other (See Comments)    RESTLESSNESS, URGES TO MOVE   Gabapentin  Swelling    SWELLING REACTION OF FEET    Olive Oil Hives   Pregabalin Other (See Comments)    Heart palpitations, Fainting, chest pain, sweats   Metoclopramide  Other (See Comments)   Zonisamide Other (See Comments)    WORSENING PAIN
